# Patient Record
Sex: Male | Born: 1970 | Race: Black or African American | Hispanic: No | Marital: Single | Smoking: Never smoker
Health system: Southern US, Community
[De-identification: ages and names within clinical notes are randomized; demographics above are authoritative.]

## PROBLEM LIST (undated history)

## (undated) DIAGNOSIS — R42 Dizziness and giddiness: Secondary | ICD-10-CM

## (undated) DIAGNOSIS — I340 Nonrheumatic mitral (valve) insufficiency: Secondary | ICD-10-CM

## (undated) DIAGNOSIS — I10 Essential (primary) hypertension: Secondary | ICD-10-CM

## (undated) DIAGNOSIS — I421 Obstructive hypertrophic cardiomyopathy: Secondary | ICD-10-CM

## (undated) DIAGNOSIS — I2510 Atherosclerotic heart disease of native coronary artery without angina pectoris: Secondary | ICD-10-CM

## (undated) DIAGNOSIS — E781 Pure hyperglyceridemia: Secondary | ICD-10-CM

## (undated) DIAGNOSIS — E669 Obesity, unspecified: Secondary | ICD-10-CM

## (undated) HISTORY — DX: Essential (primary) hypertension: I10

## (undated) HISTORY — PX: NO PAST SURGERIES: SHX2092

---
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
## 2010-02-19 ENCOUNTER — Emergency Department (HOSPITAL_COMMUNITY): Admission: EM | Admit: 2010-02-19 | Discharge: 2010-02-19 | Payer: Self-pay | Admitting: Family Medicine

## 2013-10-15 ENCOUNTER — Encounter (HOSPITAL_COMMUNITY): Payer: Self-pay | Admitting: Emergency Medicine

## 2013-10-15 ENCOUNTER — Emergency Department (HOSPITAL_COMMUNITY): Payer: Medicaid Other

## 2013-10-15 ENCOUNTER — Observation Stay (HOSPITAL_COMMUNITY)
Admission: EM | Admit: 2013-10-15 | Discharge: 2013-10-17 | Disposition: A | Discharge status: Home / Self Care | Payer: Medicaid Other | Attending: Internal Medicine | Admitting: Internal Medicine

## 2013-10-15 DIAGNOSIS — R002 Palpitations: Secondary | ICD-10-CM | POA: Diagnosis not present

## 2013-10-15 DIAGNOSIS — Z8249 Family history of ischemic heart disease and other diseases of the circulatory system: Secondary | ICD-10-CM | POA: Diagnosis not present

## 2013-10-15 DIAGNOSIS — R5381 Other malaise: Secondary | ICD-10-CM | POA: Diagnosis not present

## 2013-10-15 DIAGNOSIS — R42 Dizziness and giddiness: Secondary | ICD-10-CM | POA: Diagnosis not present

## 2013-10-15 DIAGNOSIS — E781 Pure hyperglyceridemia: Secondary | ICD-10-CM | POA: Diagnosis not present

## 2013-10-15 DIAGNOSIS — I452 Bifascicular block: Secondary | ICD-10-CM

## 2013-10-15 DIAGNOSIS — E669 Obesity, unspecified: Secondary | ICD-10-CM | POA: Diagnosis not present

## 2013-10-15 DIAGNOSIS — H538 Other visual disturbances: Secondary | ICD-10-CM | POA: Diagnosis not present

## 2013-10-15 DIAGNOSIS — Z9181 History of falling: Secondary | ICD-10-CM | POA: Diagnosis not present

## 2013-10-15 DIAGNOSIS — F17200 Nicotine dependence, unspecified, uncomplicated: Secondary | ICD-10-CM | POA: Diagnosis not present

## 2013-10-15 DIAGNOSIS — R0602 Shortness of breath: Secondary | ICD-10-CM | POA: Diagnosis not present

## 2013-10-15 DIAGNOSIS — I10 Essential (primary) hypertension: Secondary | ICD-10-CM | POA: Diagnosis not present

## 2013-10-15 DIAGNOSIS — R531 Weakness: Secondary | ICD-10-CM | POA: Diagnosis present

## 2013-10-15 DIAGNOSIS — I4510 Unspecified right bundle-branch block: Secondary | ICD-10-CM | POA: Diagnosis not present

## 2013-10-15 DIAGNOSIS — R9431 Abnormal electrocardiogram [ECG] [EKG]: Secondary | ICD-10-CM | POA: Diagnosis not present

## 2013-10-15 DIAGNOSIS — R5383 Other fatigue: Secondary | ICD-10-CM | POA: Diagnosis not present

## 2013-10-15 DIAGNOSIS — R0600 Dyspnea, unspecified: Secondary | ICD-10-CM

## 2013-10-15 HISTORY — DX: Dizziness and giddiness: R42

## 2013-10-15 HISTORY — DX: Obesity, unspecified: E66.9

## 2013-10-15 HISTORY — DX: Pure hyperglyceridemia: E78.1

## 2013-10-15 LAB — CBC
HCT: 43.7 % (ref 39.0–52.0)
HCT: 39.8 % (ref 39.0–52.0)
HEMOGLOBIN: 13.5 g/dL (ref 13.0–17.0)
Hemoglobin: 15.1 g/dL (ref 13.0–17.0)
MCH: 31.9 pg (ref 26.0–34.0)
MCH: 31.3 pg (ref 26.0–34.0)
MCHC: 34.6 g/dL (ref 30.0–36.0)
MCHC: 33.9 g/dL (ref 30.0–36.0)
MCV: 92.2 fL (ref 78.0–100.0)
MCV: 92.3 fL (ref 78.0–100.0)
PLATELETS: 223 10*3/uL (ref 150–400)
Platelets: 187 10*3/uL (ref 150–400)
RBC: 4.74 MIL/uL (ref 4.22–5.81)
RBC: 4.31 MIL/uL (ref 4.22–5.81)
RDW: 14.5 % (ref 11.5–15.5)
RDW: 14.6 % (ref 11.5–15.5)
WBC: 6.7 10*3/uL (ref 4.0–10.5)
WBC: 6.7 10*3/uL (ref 4.0–10.5)

## 2013-10-15 LAB — BASIC METABOLIC PANEL
BUN: 18 mg/dL (ref 6–23)
CHLORIDE: 101 meq/L (ref 96–112)
CO2: 21 meq/L (ref 19–32)
CREATININE: 1.11 mg/dL (ref 0.50–1.35)
Calcium: 8.8 mg/dL (ref 8.4–10.5)
GFR, EST NON AFRICAN AMERICAN: 79 mL/min — AB (ref >90)
Glucose, Bld: 98 mg/dL (ref 70–99)
POTASSIUM: 4.4 meq/L (ref 3.7–5.3)
Sodium: 139 mEq/L (ref 137–147)

## 2013-10-15 LAB — CREATININE, SERUM
Creatinine, Ser: 0.99 mg/dL (ref 0.50–1.35)
GFR calc Af Amer: >90 mL/min (ref >90)
GFR calc non Af Amer: >90 mL/min (ref >90)

## 2013-10-15 LAB — URINALYSIS, ROUTINE W REFLEX MICROSCOPIC
BILIRUBIN URINE: NEGATIVE
Glucose, UA: NEGATIVE mg/dL
HGB URINE DIPSTICK: NEGATIVE
Ketones, ur: NEGATIVE mg/dL
Leukocytes, UA: NEGATIVE
Nitrite: NEGATIVE
PH: 5 (ref 5.0–8.0)
PROTEIN: NEGATIVE mg/dL
SPECIFIC GRAVITY, URINE: 1.022 (ref 1.005–1.030)
Urobilinogen, UA: 1 mg/dL (ref 0.0–1.0)

## 2013-10-15 LAB — CBG MONITORING, ED: Glucose-Capillary: 104 mg/dL — ABNORMAL HIGH (ref 70–99)

## 2013-10-15 LAB — TROPONIN I
Troponin I: <0.3 ng/mL (ref <0.30)
Troponin I: <0.3 ng/mL (ref <0.30)

## 2013-10-15 MED ORDER — ASPIRIN 81 MG PO CHEW
324.0000 mg | CHEWABLE_TABLET | Freq: Once | ORAL | Status: AC
  Administered 2013-10-15: 324 mg via ORAL
  Filled 2013-10-15: qty 4

## 2013-10-15 MED ORDER — SODIUM CHLORIDE 0.9 % IV BOLUS (SEPSIS)
1000.0000 mL | Freq: Once | INTRAVENOUS | Status: AC
  Administered 2013-10-15: 1000 mL via INTRAVENOUS

## 2013-10-15 MED ORDER — ASPIRIN EC 325 MG PO TBEC
325.0000 mg | DELAYED_RELEASE_TABLET | Freq: Every day | ORAL | Status: DC
  Administered 2013-10-16 – 2013-10-17 (×2): 325 mg via ORAL
  Filled 2013-10-15 (×4): qty 1

## 2013-10-15 MED ORDER — MORPHINE SULFATE 2 MG/ML IJ SOLN: 2.0000 mg | INTRAMUSCULAR | Status: DC | PRN

## 2013-10-15 MED ORDER — GI COCKTAIL ~~LOC~~
30.0000 mL | Freq: Four times a day (QID) | ORAL | Status: DC | PRN
  Filled 2013-10-15: qty 30

## 2013-10-15 MED ORDER — ENOXAPARIN SODIUM 40 MG/0.4ML ~~LOC~~ SOLN
40.0000 mg | SUBCUTANEOUS | Status: DC
  Administered 2013-10-15 – 2013-10-16 (×2): 40 mg via SUBCUTANEOUS
  Filled 2013-10-15 (×4): qty 0.4

## 2013-10-15 NOTE — Progress Notes (Signed)
Patient trasfered from ED to 5W11 via strecher; alert and oriented x 4; no complaints of pain; IV saline locked in LAC ; skin intact. Orient patient to room and unit; watch safety video; gave patient care guide; instructed how to use the call bell and  fall risk precautions. Will continue to monitor the patient.

## 2013-10-15 NOTE — H&P (Signed)
[facility] History and Physical  Victor [name] ZOX:[id number] DOB: 06-Aug-1970 DOA: 10/15/2013  Referring physician:  PCP: No PCP Per Patient   Chief Complaint: Weakness  HPI: Victor [name] is a 43 y.o. male without significant past medical history, does not have a primary care provider, presenting to the emergency room with complaints of weakness. He states having generalized weakness over the past week with associated dizziness characterized as "swimmy headed" occurring intermittently. 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 He describes having some exertional shortness of breath, particularly with his work duties. He endorses a family history of coronary artery disease in his mother who underwent percutaneous intervention in her 58s. Patient otherwise denies chest pain, nausea, vomiting, diaphoresis, abdominal pain, syncope or palpitations. He did report having an episode of chest pain last month which was localized in the retrosternal region, non-radiating, lasting several minutes then resolve spontaneously. He denies tobacco abuse however endorses to a tobacco. In the emergency room EKG showing bifascicular block, T wave inversions, with initial troponin within normal limits.                           Review of Systems:  Constitutional:  No weight loss, night sweats, Fevers, chills, fatigue.  HEENT:  No headaches, Difficulty swallowing,Tooth/dental problems,Sore throat,  No sneezing, itching, ear ache, nasal congestion, post nasal drip,  Cardio-vascular:  No chest pain, Orthopnea, PND, swelling in lower extremities, anasarca, dizziness, palpitations  GI:  No heartburn, indigestion, abdominal pain, nausea, vomiting, diarrhea, change in bowel habits, loss of appetite  Resp:  Positive shortness of breath with exertion. No excess mucus, no productive cough, No non-productive cough, No coughing up of blood.No change in color  of mucus.No wheezing.No chest wall deformity  Skin:  no rash or lesions.  GU:  no dysuria, change in color of urine, no urgency or frequency. No flank pain.  Musculoskeletal:  No joint pain or swelling. No decreased range of motion. No back pain.  Psych:  No change in mood or affect. No depression or anxiety. No memory loss.   History reviewed. No pertinent past medical history. History reviewed. No pertinent past surgical history. Social History:  reports that he has never smoked. His smokeless tobacco use includes Chew. He reports that he drinks alcohol. He reports that he does not use illicit drugs.  No Known Allergies  No family history on file.   Prior to Admission medications   Medication Sig Start Date End Date Taking? Authorizing Provider  ibuprofen (ADVIL,MOTRIN) 200 MG tablet Take 600 mg by mouth every 6 (six) hours as needed for moderate pain.   Yes Historical Provider, MD   Physical Exam: Filed Vitals:   10/15/13 1515  BP: 154/77  Pulse: 81  Temp:   Resp: 15    BP 154/77  Pulse 81  Temp(Src) 98.6 F (37 C) (Oral)  Resp 15  Ht 5\' 7"  (1.702 m)  Wt 113.535 kg (250 lb 4.8 oz)  BMI 39.19 kg/m2  SpO2 99%  General:  Appears calm and comfortable Eyes: PERRL, normal lids, irises & conjunctiva ENT: grossly normal hearing, lips & tongue Neck: no LAD, masses or thyromegaly Cardiovascular: RRR, no m/r/g. No LE edema. Telemetry: SR, no arrhythmias  Respiratory: CTA bilaterally, no w/r/r. Normal respiratory effort. Abdomen: soft, ntnd Skin: no rash or induration seen on limited exam Musculoskeletal: grossly normal tone BUE/BLE Psychiatric: grossly normal mood and affect,  speech fluent and appropriate Neurologic: grossly non-focal.          Labs on Admission:  Basic Metabolic Panel:  Recent Labs Lab 10/15/13 1215  NA 139  K 4.4  CL 101  CO2 21  GLUCOSE 98  BUN 18  CREATININE 1.11  CALCIUM 8.8   Liver Function Tests: No results found for this  basename: AST, ALT, ALKPHOS, BILITOT, PROT, ALBUMIN,  in the last 168 hours No results found for this basename: LIPASE, AMYLASE,  in the last 168 hours No results found for this basename: AMMONIA,  in the last 168 hours CBC:  Recent Labs Lab 10/15/13 1215  WBC 6.7  HGB 15.1  HCT 43.7  MCV 92.2  PLT 223   Cardiac Enzymes:  Recent Labs Lab 10/15/13 1229  TROPONINI <0.30    BNP (last 3 results) No results found for this basename: PROBNP,  in the last 8760 hours CBG:  Recent Labs Lab 10/15/13 1150  GLUCAP 104*    Radiological Exams on Admission: Dg Chest 2 View  10/15/2013   CLINICAL DATA:  Eval cardiac silohuette  EXAM: CHEST  2 VIEW  COMPARISON:  None.  FINDINGS: Low lung volumes. The heart size and mediastinal contours are within normal limits. Both lungs are clear. The visualized skeletal structures are unremarkable.  IMPRESSION: No active cardiopulmonary disease.   Electronically Signed   By: Salome HolmesHector  Cooper M.D.   On: 10/15/2013 13:26    EKG: Independently reviewed. Bifascicular block, T-Wave inversions.   Assessment/Plan Active Problems:   Dizziness   Weakness   Abnormal EKG   1. Weakness. Patient without significant past medical history, presented with a one month history of weakness and poor tolerance to physical exertion. Patient works in a family history of coronary artery disease. Initial troponin negative. Will place patient in overnight observation, cycle troponin x3 sets. Obtain fasting lipid panel for risk stratification. Aspirin therapy to 325 mg by mouth daily, check a transthoracic echocardiogram. Will also check a TSH.  2.  DVT prophylaxis: Lovenox    Code Status: Full Code Family Communication:  Disposition Plan: Will place in overnight observation, do not anticipate patient requiring greater than 2 nights hospitalization  Time spent: 55 min  Jeralyn BennettZAMORA, Caoilainn Sacks [facility] Pager [phone]

## 2013-10-15 NOTE — ED Notes (Signed)
Patient transported to X-ray 

## 2013-10-15 NOTE — ED Provider Notes (Signed)
CSN: [id number]     Arrival date & time 10/15/13  1123 History   First MD Initiated Contact with Patient 10/15/13 1208     Chief Complaint  Patient presents with  . Dizziness  . Weakness     (Consider location/radiation/quality/duration/timing/severity/associated sxs/prior Treatment) Patient is a 43 y.o. male presenting with dizziness. The history is provided by the patient.  Dizziness Quality:  Lightheadedness Severity:  Mild Onset quality:  Gradual Duration:  2 weeks Timing:  Intermittent Progression:  Waxing and waning Chronicity:  New Context: standing up   Context: not when bending over, not with eye movement, not with head movement and not with loss of consciousness   Relieved by: sometimes goes away, no particular triggers or alleviating factors. Worsened by:  Nothing tried Ineffective treatments:  None tried Associated symptoms: no blood in stool, no chest pain, no diarrhea, no headaches, no nausea, no shortness of breath, no syncope, no vomiting and no weakness   Risk factors: no anemia and no new medications     History reviewed. No pertinent past medical history. History reviewed. No pertinent past surgical history. No family history on file. History  Substance Use Topics  . Smoking status: Never Smoker   . Smokeless tobacco: Current User    Types: Chew  . Alcohol Use: Yes    Review of Systems  Constitutional: Negative for fever, activity change and appetite change.  HENT: Negative for congestion and rhinorrhea.   Eyes: Negative for discharge and itching.  Respiratory: Negative for cough, shortness of breath and wheezing.   Cardiovascular: Negative for chest pain and syncope.  Gastrointestinal: Negative for nausea, vomiting, abdominal pain, diarrhea, constipation and blood in stool.  Genitourinary: Negative for hematuria, decreased urine volume and difficulty urinating.  Skin: Negative for rash and wound.  Neurological: Positive for dizziness. Negative for  syncope, weakness, numbness and headaches.  All other systems reviewed and are negative.      Allergies  Review of patient's allergies indicates no known allergies.  Home Medications   Current Outpatient Rx  Name  Route  Sig  Dispense  Refill  . ibuprofen (ADVIL,MOTRIN) 200 MG tablet   Oral   Take 600 mg by mouth every 6 (six) hours as needed for moderate pain.          BP 158/73  Pulse 87  Temp(Src) 98.6 F (37 C) (Oral)  Resp 20  Ht 5\' 7"  (1.702 m)  Wt 250 lb 4.8 oz (113.535 kg)  BMI 39.19 kg/m2  SpO2 97% Physical Exam  Vitals reviewed. Constitutional: He is oriented to person, place, and time. He appears well-developed and well-nourished. No distress.  HENT:  Head: Normocephalic and atraumatic.  Mouth/Throat: Oropharynx is clear and moist. No oropharyngeal exudate.  Eyes: Conjunctivae and EOM are normal. Pupils are equal, round, and reactive to light. Right eye exhibits no discharge. Left eye exhibits no discharge. No scleral icterus.  Neck: Normal range of motion. Neck supple.  Cardiovascular: Normal rate, regular rhythm, normal heart sounds and intact distal pulses.  Exam reveals no gallop and no friction rub.   No murmur heard. Pulmonary/Chest: Effort normal and breath sounds normal. No respiratory distress. He has no wheezes. He has no rales.  Abdominal: Soft. He exhibits no distension and no mass. There is no tenderness.  Musculoskeletal: Normal range of motion.  Neurological: He is alert and oriented to person, place, and time. No cranial nerve deficit. He exhibits normal muscle tone. Coordination normal.  5/5 strength in all exts,  normal sensation in all extremities, 2+ DTRs b/l in patella and brachioradilias, F2N neg, H2S neg, Romberg neg, ambulatory without ataxia, CN II-XII without deficit  Skin: Skin is warm. No rash noted. He is not diaphoretic.    ED Course  Procedures (including critical care time) Labs Review Labs Reviewed  BASIC METABOLIC PANEL -  Abnormal; Notable for the following:    GFR calc non Af Amer 79 (*)    All other components within normal limits  CBG MONITORING, ED - Abnormal; Notable for the following:    Glucose-Capillary 104 (*)    All other components within normal limits  CBC  TROPONIN I  URINALYSIS, ROUTINE W REFLEX MICROSCOPIC  CBG MONITORING, ED   Imaging Review Dg Chest 2 View  10/15/2013   CLINICAL DATA:  Eval cardiac silohuette  EXAM: CHEST  2 VIEW  COMPARISON:  None.  FINDINGS: Low lung volumes. The heart size and mediastinal contours are within normal limits. Both lungs are clear. The visualized skeletal structures are unremarkable.  IMPRESSION: No active cardiopulmonary disease.   Electronically Signed   By: Salome Holmes M.D.   On: 10/15/2013 13:26     EKG Interpretation   Date/Time:  Thursday October 15 2013 11:29:30 EDT Ventricular Rate:  96 PR Interval:  152 QRS Duration: 138 QT Interval:  416 QTC Calculation: 525 R Axis:   -77 Text Interpretation:  Normal sinus rhythm Biatrial enlargement Right  bundle branch block Left anterior fascicular block Bifasicular block T  wave abnormality, consider inferolateral ischemia Abnormal ECG Confirmed  by WARD,  DO, KRISTEN ([id number]) on 10/15/2013 12:27:41 PM      MDM   MDM: 43 y.o. AAM w/ no PMHx w/ cc: of dizziness for 2 weeks. Non vertiginous. Intermittent. Currently having some dizziness. Complains of blurred vision but has baseline L eye vision deficit and states the blurred vision has resolved. Denies chest pain, SOB. AFVSS, well appaering, no neuro deficits. EKG with TWIS throughout. Trop negative. Other labs negative. Very concerning EKG, will admit for cardiac r/o. Care of case d/w my attending.   Final diagnoses:  None   Admit  Pilar Jarvis, MD 10/15/13 1515

## 2013-10-15 NOTE — ED Notes (Signed)
PT reports onset of dizziness/unsteadiness two weeks ago, but came in today b/c he now feels weak and has some blurred vision. Denies nausea, sob, chest pain.

## 2013-10-15 NOTE — Discharge Planning (Signed)
[facility] Felicia E, [facility] Liaison  Spoke to patient about primary care resources and establishing care with a provider. [facility], medicalcard application and resource guide was given. My contact information was also provided for any future questions or concerns.

## 2013-10-15 NOTE — ED Notes (Signed)
Patient states that he is dizzy but denies blurry vision at this time.  patietn states that he feels like when he is walking he could fall because of the dizziness

## 2013-10-15 NOTE — ED Notes (Signed)
Patient does not need a stroke swallow screen and is ok to eat and drink, per Dr. Elesa MassedWard

## 2013-10-15 NOTE — ED Provider Notes (Signed)
I saw and evaluated the patient, reviewed the resident's note and I agree with the findings and plan.   EKG Interpretation   Date/Time:  Thursday October 15 2013 11:29:30 EDT Ventricular Rate:  96 PR Interval:  152 QRS Duration: 138 QT Interval:  416 QTC Calculation: 525 R Axis:   -77 Text Interpretation:  Normal sinus rhythm Biatrial enlargement Right  bundle branch block Left anterior fascicular block Bifasicular block T  wave abnormality, consider inferolateral ischemia Abnormal ECG Confirmed  by Devri Kreher,  DO, Taela Charbonneau ([id number]) on 10/15/2013 12:27:41 PM      Pt is a 43 y.o. F with no known past medical history presents emergency room with 2 weeks of intermittent dizziness where he feels that he is going to pass out. He does not vertiginous. Denies any chest pain, palpitations, vomiting, diarrhea, bloody stool or melena, shortness of breath, cough, fever. His EKG is grossly abnormal and he denies a cardiac history but states he has not been seen by her primary doctor in several years. He does have family history of cardiac disease and he chews tobacco. His troponin here is negative. He is mildly orthostatic with slight elevation in heart rate with standing. We'll give IV fluids. Chest x-ray is clear. He is neurologically intact. Discussed with hospitalist for admission for ACS rule out and I am concerned that his dizziness and near syncopal events may be his anginal equivalent.  Layla MawKristen N Tearsa Kowalewski, DO 10/15/13 1623

## 2013-10-16 ENCOUNTER — Encounter (HOSPITAL_COMMUNITY): Payer: Self-pay | Admitting: Nurse Practitioner

## 2013-10-16 ENCOUNTER — Observation Stay (HOSPITAL_COMMUNITY): Payer: Medicaid Other

## 2013-10-16 DIAGNOSIS — I10 Essential (primary) hypertension: Secondary | ICD-10-CM

## 2013-10-16 DIAGNOSIS — R9431 Abnormal electrocardiogram [ECG] [EKG]: Secondary | ICD-10-CM

## 2013-10-16 DIAGNOSIS — R42 Dizziness and giddiness: Secondary | ICD-10-CM | POA: Diagnosis not present

## 2013-10-16 DIAGNOSIS — I517 Cardiomegaly: Secondary | ICD-10-CM

## 2013-10-16 DIAGNOSIS — R0989 Other specified symptoms and signs involving the circulatory and respiratory systems: Secondary | ICD-10-CM

## 2013-10-16 DIAGNOSIS — R5383 Other fatigue: Secondary | ICD-10-CM | POA: Diagnosis not present

## 2013-10-16 DIAGNOSIS — E781 Pure hyperglyceridemia: Secondary | ICD-10-CM | POA: Diagnosis not present

## 2013-10-16 DIAGNOSIS — R5381 Other malaise: Secondary | ICD-10-CM | POA: Diagnosis not present

## 2013-10-16 DIAGNOSIS — R0609 Other forms of dyspnea: Secondary | ICD-10-CM

## 2013-10-16 DIAGNOSIS — R0600 Dyspnea, unspecified: Secondary | ICD-10-CM

## 2013-10-16 LAB — LIPID PANEL
Cholesterol: 138 mg/dL (ref 0–200)
HDL: 21 mg/dL — AB (ref >39)
LDL CALC: UNDETERMINED mg/dL (ref 0–99)
TRIGLYCERIDES: 928 mg/dL — AB (ref <150)
Total CHOL/HDL Ratio: 6.6 RATIO
VLDL: UNDETERMINED mg/dL (ref 0–40)

## 2013-10-16 LAB — COMPREHENSIVE METABOLIC PANEL
ALBUMIN: 3.4 g/dL — AB (ref 3.5–5.2)
ALT: 74 U/L — ABNORMAL HIGH (ref 0–53)
AST: 54 U/L — AB (ref 0–37)
Alkaline Phosphatase: 55 U/L (ref 39–117)
BILIRUBIN TOTAL: 0.9 mg/dL (ref 0.3–1.2)
BUN: 11 mg/dL (ref 6–23)
CO2: 22 mEq/L (ref 19–32)
CREATININE: 0.95 mg/dL (ref 0.50–1.35)
Calcium: 8.8 mg/dL (ref 8.4–10.5)
Chloride: 101 mEq/L (ref 96–112)
GFR calc Af Amer: >90 mL/min (ref >90)
Glucose, Bld: 103 mg/dL — ABNORMAL HIGH (ref 70–99)
Potassium: 3.9 mEq/L (ref 3.7–5.3)
Sodium: 137 mEq/L (ref 137–147)
Total Protein: 6.9 g/dL (ref 6.0–8.3)

## 2013-10-16 LAB — TSH: TSH: 2.164 u[IU]/mL (ref 0.350–4.500)

## 2013-10-16 LAB — PRO B NATRIURETIC PEPTIDE: Pro B Natriuretic peptide (BNP): 72.3 pg/mL (ref 0–125)

## 2013-10-16 LAB — HEMOGLOBIN A1C
HEMOGLOBIN A1C: 6.1 % — AB (ref <5.7)
Mean Plasma Glucose: 128 mg/dL — ABNORMAL HIGH (ref <117)

## 2013-10-16 LAB — TROPONIN I: Troponin I: <0.3 ng/mL (ref <0.30)

## 2013-10-16 MED ORDER — SODIUM CHLORIDE 0.9 % IV SOLN
INTRAVENOUS | Status: DC
  Administered 2013-10-16 – 2013-10-17 (×3): via INTRAVENOUS

## 2013-10-16 MED ORDER — GEMFIBROZIL 600 MG PO TABS
600.0000 mg | ORAL_TABLET | Freq: Two times a day (BID) | ORAL | Status: DC
  Administered 2013-10-16 – 2013-10-17 (×2): 600 mg via ORAL
  Filled 2013-10-16 (×4): qty 1

## 2013-10-16 MED ORDER — AMLODIPINE BESYLATE 2.5 MG PO TABS
2.5000 mg | ORAL_TABLET | Freq: Every day | ORAL | Status: DC
  Administered 2013-10-16 – 2013-10-17 (×2): 2.5 mg via ORAL
  Filled 2013-10-16 (×2): qty 1

## 2013-10-16 NOTE — Evaluation (Signed)
Physical Therapy Vestibular Evaluation Patient Details Name: Victor [name] MRN: [id number] DOB: 11/08/1970 Today's Date: 10/16/2013   History of Present Illness  Victor [name] is a 43 y.o. male without significant past medical history, does not have a primary care provider, presenting to the emergency room with complaints of weakness. He states having generalized weakness over the past week with associated dizziness characterized as "swimmy headed" occurring intermittently. 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 He describes having some exertional shortness of breath, particularly with his work duties. He endorses a family history of coronary artery disease in his mother who underwent percutaneous intervention in her 66s. Patient otherwise denies chest pain, nausea, vomiting, diaphoresis, abdominal pain, syncope or palpitations. He did report having an episode of chest pain last month which was localized in the retrosternal region, non-radiating, lasting several minutes then resolve spontaneously. He denies tobacco abuse however endorses to a tobacco. In the emergency room EKG showing bifascicular block, T wave inversions, with initial troponin within normal limits  Clinical Impression  Patient presents with independent mobility; but with symptoms of potential bilateral vestibular hypofunction.  He may benefit from referral to outpatient PT for vestibular/balance therapy.  Gave pt referral to take when he has follow up appointment.  No further acute level PT needs at this time.    Follow Up Recommendations Outpatient PT    Equipment Recommendations  None recommended by PT    Recommendations for Other Services       Precautions / Restrictions Precautions Precautions: None      Mobility  Bed Mobility Overal bed mobility: Independent                Transfers Overall transfer level: Independent                   Ambulation/Gait Ambulation/Gait assistance: Independent Ambulation Distance (Feet): 250 Feet Assistive device: None Gait Pattern/deviations: WFL(Within Functional Limits)   Gait velocity interpretation: >2.62 ft/sec, indicative of independent community ambulator    Stairs Stairs: Yes Stairs assistance: Modified independent (Device/Increase time) Stair Management: One rail Right;Alternating pattern Number of Stairs: 3    Wheelchair Mobility    Modified Rankin (Stroke Patients Only)       Balance Overall balance assessment: Independent           Standing balance-Leahy Scale: Normal     Single Leg Stance - Left Leg: 10 Tandem Stance - Right Leg: 30     Rhomberg - Eyes Closed: 30   High Level Balance Comments: wavers while eyes closed feet together, but no loss of balance     Pertinent Vitals/Pain No pain    Home Living Family/patient expects to be discharged to:: Private residence Living Arrangements: Parent Available Help at Discharge: Family Type of Home: House Home Access: Stairs to enter Entrance Stairs-Rails: Chemical engineer of Steps: 3 Home Layout: One level Home Equipment: None      Prior Function Level of Independence: Independent               Hand Dominance        Extremity/Trunk Assessment               Lower Extremity Assessment: Overall WFL for tasks assessed         Communication   Communication: No difficulties  Cognition Arousal/Alertness: Awake/alert Behavior During Therapy: WFL for tasks assessed/performed Overall Cognitive Status: Within Functional Limits for tasks assessed  General Comments General comments (skin integrity, edema, etc.): Vestibular testing: oculomotor: noted right eye not aligned, 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     Exercises  Briefly educated pt in VOR training exercises, but encouraged outpatient follow up      Assessment/Plan    PT Assessment All further PT needs can be met in the next venue of care  PT Diagnosis Abnormality of gait   PT Problem List Decreased balance;Other (comment) (dizziness)  PT Treatment Interventions     PT Goals (Current goals can be found in the Care Plan section) Acute Rehab PT Goals PT Goal Formulation: No goals set, d/c therapy    Frequency     Barriers to discharge        End of Session Equipment Utilized During Treatment: Gait belt Activity Tolerance: Patient tolerated treatment well      Functional Limitation: Mobility: Walking and moving around Mobility: Walking and Moving Around Current Status ([id number]): At least 1 percent but less than 20 percent impaired, limited or restricted Mobility: Walking and Moving Around Goal Status [phone]): At least 1 percent but less than 20 percent impaired, limited or restricted Mobility: Walking and Moving Around Discharge Status [phone]): At least 1 percent but less than 20 percent impaired, limited or restricted    Time: 1611-1650 PT Time Calculation (min): 39 min   Charges:   PT Evaluation $Initial PT Evaluation Tier I: 1 Procedure PT Treatments $Gait Training: 8-22 mins $Neuromuscular Re-education: 8-22 mins   PT G Codes:     Functional Limitation: Mobility: Walking and moving around    [facility] 10/16/2013, 5:16 PM [place], [place] 10/16/2013

## 2013-10-16 NOTE — Progress Notes (Signed)
  Echocardiogram 2D Echocardiogram has been performed.  Georgian CoWILLIAMS, Muzammil Bruins 10/16/2013, 3:38 PM

## 2013-10-16 NOTE — Care Management Note (Signed)
    Page 1 of 1   10/19/2013     6:17:32 PM   CARE MANAGEMENT NOTE 10/19/2013  Patient:  Victor [name],Victor [name]   Account Number:  [id number]  Date Initiated:  10/16/2013  Documentation initiated by:  Letha CapeAYLOR,Meiya Wisler  Subjective/Objective Assessment:   dx weaknjess, dizzy     Action/Plan:   pt eval-   Anticipated DC Date:  10/16/2013   Anticipated DC Plan:  HOME/SELF CARE      DC Planning Services  CM consult  MATCH Program  Medication Assistance      Choice offered to / List presented to:             Status of service:  Completed, signed off Medicare Important Message given?   (If response is "NO", the following Medicare IM given date fields will be blank) Date Medicare IM given:   Date Additional Medicare IM given:    Discharge Disposition:  HOME/SELF CARE  Per UR Regulation:  Reviewed for med. necessity/level of care/duration of stay  If discussed at Long Length of Stay Meetings, dates discussed:    Comments:  10/16/13 1629 Letha Capeeborah Lollie Gunner RN, BSN [phone] patient lives with mother, patient has f/u apt scheduled with wellness clinic on 4/20.  NCM assisted patient with meds with Match Letter.  physical therapy is working with patient, pateint does not have any insurance at this time.

## 2013-10-16 NOTE — Progress Notes (Signed)
Utilization review completed.  

## 2013-10-16 NOTE — Hospital Discharge Follow-Up (Signed)
[facility] Tivis RingerFelicia E, Community Liaison  [facility] at 9:00 am. Patient will be establishing care and obtaining the orange card at the practice. Patient is aware of this appointment, patients RN notified about this appointment as well. My contact information was provided for any future questions or concerns.

## 2013-10-16 NOTE — Consult Note (Signed)
CARDIOLOGY CONSULT NOTE   Patient ID: Victor [name] MRN: [id number], DOB/AGE: 45/03/1971   Admit date: 10/15/2013 Date of Consult: 10/16/2013  Primary Physician: No PCP Per Patient Primary Cardiologist: new to    Pt. Profile 43 y.o. AA male with 2 wk hx of DOE, dizziness, and generalized weakness who [facility]. EKG revealed bifascicular block,  RBBB with T wave inversion. Troponin x3 negative.   Problem List  Past Medical History  Diagnosis Date  . Dizzy 10/15/2013     "SWIMMY HEAD    "  . Hypertriglyceridemia   . Obesity     History reviewed. No pertinent past surgical history.   Allergies  No Known Allergies  HPI  43 y.o. A.A. male who was last seen by a medical provider 15 yrs ago, [facility] weakness. Patient reports that 1 month ago he experienced a 1 minute episode of retrosternal chest pain while standing that resolved spontaneously. Positive for palpitations/fluttering, and SOB. No syncope, N/V, diaphoresis or other associated symptoms. He has not experienced a episode of chest pain since the prior event. He does admit to "numbness" in the anterior lateral portion of his left upper thigh. He reports the loses of sensation occurred around the time of the retrosternal chest pain and remains.  He also admits to mandible pain since the event that occurs intermittently and is relieved with ibuprofen. He is on no home medications and only takes ibuprofen for pain.   For the last 2 weeks he has experienced intermittent generalized weakness with associated dizziness, and blurry vision while driving and while standing.  He currently does yard work for a living and has had decreased tolerance to physical exertion with SOB over the past 8 mos. He denies smoking; however uses smokeless tobacco every other day. He drinks approx 96 fl.oz of beer/wk. He denies illicit drug use stating the last time he smoked marijuana was in high school. He was incarcertaed 1  month ago were he underwent HIV testing. Result negative. He has a positive family history of CAD in his mother who underwent PCI last year @ age 858. Patient reports that she also underwent repair of an aneurysm in her chest last week.     Yesterday, he was driving to [facility] to visit his mother when he had another episode of lightheadedness.  He describes this as feeling swimmy headed w/o room spinning.  Lightheadedness is unchanged by position changes of his head but has been better with sitting or lying down.  He decided to drive himself to the ED where EKG revealed a bifascicular block, RBBB with T wave inversions. Troponin negative x3, TSH 2.164, Trig 928, BUN/Creat 18/0.99. He was admitted to [facility] for observation. He has experienced no events overnight. Orthostatic BP done this morning revealed 158/84 sitting, and 144/64 standing, HR 109, SpO2 94% on Rm air. Cardiology is consulting this morning due to patient's EKG changes, sig family hx, and lack of past health care access.   Inpatient Medications  . aspirin EC  325 mg Oral Daily  . enoxaparin (LOVENOX) injection  40 mg Subcutaneous Q24H  . gemfibrozil  600 mg Oral BID AC   Family History Family History  Problem Relation Age of Onset  . CAD Mother     stenting @ 958; alive @ 8959  . Aneurysm Mother     thoracic s/p surgery @ 3659  . Other Father     unknown    Social History History  Social History  . Marital Status: Single    Spouse Name: N/A    Number of Children: N/A  . Years of Education: N/A   Occupational History  . Not on file.   Social History Main Topics  . Smoking status: Never Smoker   . Smokeless tobacco: Current User    Types: Chew  . Alcohol Use: Yes     Comment: 4 12 oz beers twice/wk.  . Drug Use: No  . Sexual Activity: Not on file   Other Topics Concern  . Not on file   Social History Narrative   Lives in [place].  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    Review of Systems  General:  No  chills, fever, night sweats or weight changes.  Cardiovascular:  + chest pain x 1 about 1 month ago, + dyspnea on exertion since last summer, + palpitations about once/wk x a few secs. Negative  edema, orthopnea, paroxysmal nocturnal dyspnea. Dermatological: No rash, lesions/masses Respiratory: Positive dyspnea. No cough.  Urologic: No hematuria, dysuria Abdominal:   No nausea, vomiting, diarrhea, bright red blood per rectum, melena, or hematemesis Neurologic:  + visual changes, and bilateral leg weakness with standing. Loss of sharp/dull sensation on left outer upper thigh.  No changes in mental status. All other systems reviewed and are otherwise negative except as noted above.  Physical Exam  Blood pressure 148/91, pulse 69, temperature 98.2 F (36.8 C), temperature source Oral, resp. rate 18, height 5\' 7"  (1.702 m), weight 250 lb (113.399 kg), SpO2 97.00%.  General: Pleasant, NAD Psych: Normal affect. Answers questions appropriately.   Neuro: Alert and oriented X 3. Moves all extremities spontaneously. HEENT: Alpha. AT. Sclerae non-icteric, conjunctiva pink and moist. Turbinated non-injected. No nasal discharge. No thyromegaly or nodules. Oropharnyx pink and moist. Halitosis. Missing bottom 4 front teeth.     Neck: Supple without bruits or JVD. Carotid upstroke 2+.  Lungs:  Resp regular and unlabored, CTA. Low volume throughout.  Heart: RRR no s3, s4, 2/6 TR Cap refill <3 sec.  Abdomen: Obese. Soft, non-tender, non-distended, BS + x 4.  Extremities: No clubbing, cyanosis or edema. DP/PT/Radials 2+ and equal bilaterally.  Labs   Recent Labs  10/15/13 1229 10/15/13 2037 10/16/13 0449  TROPONINI <0.30 <0.30 <0.30   Lab Results  Component Value Date   WBC 6.7 10/15/2013   HGB 13.5 10/15/2013   HCT 39.8 10/15/2013   MCV 92.3 10/15/2013   PLT 187 10/15/2013     Recent Labs Lab 10/16/13 0854  NA 137  K 3.9  CL 101  CO2 22  BUN 11  CREATININE 0.95  CALCIUM 8.8  PROT 6.9    BILITOT 0.9  ALKPHOS 55  ALT 74*  AST 54*  GLUCOSE 103*   Lab Results  Component Value Date   CHOL 138 10/16/2013   HDL 21* 10/16/2013   LDLCALC UNABLE TO CALCULATE IF TRIGLYCERIDE OVER 400 mg/dL 1/61/0960   TRIG 454* 0/98/1191   Radiology/Studies  Dg Chest 2 View  10/15/2013   CLINICAL DATA:  Eval cardiac silohuette  EXAM: CHEST  2 VIEW  COMPARISON:  None.  FINDINGS: Low lung volumes. The heart size and mediastinal contours are within normal limits. Both lungs are clear. The visualized skeletal structures are unremarkable.  IMPRESSION: No active cardiopulmonary disease.   Electronically Signed   By: Salome Holmes M.D.   On: 10/15/2013 13:26   ECG 10/16/2013: NSR, Left anterior fasciolar block (V3, V4,  II, III, aVf), possible new RBBB. T wave inversion.   ASSESSMENT AND PLAN 43 y.o. A.A. Male with a sig family hx of CAD who presented with 2 weeks of DOE and SOB 3/26. EKG revealed a bifascicular block, RBBB with T wave inversions. Troponin negative x3, TSH 2.164, Trig 928, BUN/Creat 18/0.99. K 4.4. Admitted to 5W for observation. He has experienced no events overnight. Orthostatic BP done this morning revealed 158/84 sitting, and 144/64 standing, HR 109, SpO2 94% on Rm air. Patient's current [place] Soft CV risk is 82% for CAD, CVD, PVD, and HF.   Abnl ECG/DOE > Presents with wkns/lightheadedness and 1 episode of chest pain about a month ago.  ECG shows RBBB with strain pattern/T wave changes.  Prefer to get echo to assess EF today.  If EF down->Cath, otw, will plan on cardiolite in the AM. > Continue tele > Schedule for ECHO > ASA therapy  HTN, Stage I > Initiate low-dose amlodipine for starters.  > Continue to monitor BP  Leg weakness/numbness  > Consider CT of head to r/o stroke. Patient is at high risk for CVD in the presence of ethnicity, poor health management and possible commorbidities.   Hypertriglycerdemia (928) >fibrate initiated.  Signed, Pedro Earls,  [employer] STUDENT- NP 10/16/2013, 11:04 AM   Patient seen and examined independently. Ms. Sanders'/Chris Berge's, NP note reviewed carefully - agree with theri assessment and plan. I have edited the note based on my findings.   He has had progressive DOE over the past few weeks but no overt HF. W/u so far has revealed undiagnosed HTN, abnormal ECG (RBBB/LVH with repol) and maked hypertriglyceridemia.Agree with ECHO. If echo normal will proceed with ETT/Myoview. If echo abnormal consider cath. Start amlodipine for HTN. Fibrate started by primary team. Likely needs outpatient sleep study as well.   Birdie Fetty,MD 11:27 AM

## 2013-10-16 NOTE — Progress Notes (Signed)
PATIENT DETAILS Name: Victor [name] Age: 43 y.o. Sex: male Date of Birth: 11-26-1970 Admit Date: 10/15/2013 Admitting Physician Jeralyn Bennett, MD PCP:No PCP Per Patient  Subjective: Admitted with lightheadedness and weakness. No lightheadedness at rest, however on ambulation continues to have mild lightheadedness.  Assessment/Plan: Principal Problem: Lightheadedness - Not sure what the exact etiology is. Orthostatic vitals and negative this morning. - Telemetry essentially unremarkable, EKG with significant T wave inversions. - We'll check a CT head, consult cardiology- suspect will need a nuclear stress test while inpatient.  Active Problems: Hypertriglyceridemia - Check A1c, however we'll go ahead and start Lopid - Further optimization will need to be done in the outpatient setting.  Hypertension - Await further workup in terms of his stress test, A1c-will likely need to be initiated on Antihypertensives prior to discharge.    Abnormal EKG - Await cardiology eval. - Checking a CT head.   Disposition: Remain inpatient  DVT Prophylaxis: Prophylactic Lovenox   Code Status: Full code   Family Communication  none at bedside.   Procedures:   none  CONSULTS:  cardiology  Time spent 40 minutes-which includes 50% of the time with face-to-face with patient/ family and coordinating care related to the above assessment and plan.    MEDICATIONS: Scheduled Meds: . aspirin EC  325 mg Oral Daily  . enoxaparin (LOVENOX) injection  40 mg Subcutaneous Q24H  . gemfibrozil  600 mg Oral BID AC   Continuous Infusions: . sodium chloride     PRN Meds:.gi cocktail, morphine injection  Antibiotics: Anti-infectives   None       PHYSICAL EXAM: Vital signs in last 24 hours: Filed Vitals:   10/16/13 0825 10/16/13 0828 10/16/13 0830 10/16/13 0950  BP: 165/85 152/84 144/99 148/91  Pulse: 72 72  69  Temp:    98.2 F (36.8 C)  TempSrc:    Oral  Resp:    18    Height:      Weight:      SpO2: 99% 96%  97%    Weight change:  Filed Weights   10/15/13 1129 10/15/13 1635  Weight: 113.535 kg (250 lb 4.8 oz) 113.399 kg (250 lb)   Body mass index is 39.15 kg/(m^2).   Gen Exam: Awake and alert with clear speech.   Neck: Supple, No JVD.   Chest: B/L Clear.   CVS: S1 S2 Regular, no murmurs.  Abdomen: soft, BS +, non tender, non distended.  Extremities: no edema, lower extremities warm to touch. Neurologic: Non Focal.   Skin: No Rash.   Wounds: N/A.   Intake/Output from previous day: No intake or output data in the 24 hours ending 10/16/13 1024   LAB RESULTS: CBC  Recent Labs Lab 10/15/13 1215 10/15/13 2037  WBC 6.7 6.7  HGB 15.1 13.5  HCT 43.7 39.8  PLT 223 187  MCV 92.2 92.3  MCH 31.9 31.3  MCHC 34.6 33.9  RDW 14.5 14.6    Chemistries   Recent Labs Lab 10/15/13 1215 10/15/13 2037 10/16/13 0854  NA 139  --  137  K 4.4  --  3.9  CL 101  --  101  CO2 21  --  22  GLUCOSE 98  --  103*  BUN 18  --  11  CREATININE 1.11 0.99 0.95  CALCIUM 8.8  --  8.8    CBG:  Recent Labs Lab 10/15/13 1150  GLUCAP 104*    GFR Estimated Creatinine Clearance: 119.3  ml/min (by C-G formula based on Cr of 0.95).  Coagulation profile No results found for this basename: INR, PROTIME,  in the last 168 hours  Cardiac Enzymes  Recent Labs Lab 10/15/13 1229 10/15/13 2037 10/16/13 0449  TROPONINI <0.30 <0.30 <0.30    No components found with this basename: POCBNP,  No results found for this basename: DDIMER,  in the last 72 hours No results found for this basename: HGBA1C,  in the last 72 hours  Recent Labs  10/16/13 0449  CHOL 138  HDL 21*  LDLCALC UNABLE TO CALCULATE IF TRIGLYCERIDE OVER 400 mg/dL  TRIG 161928*  CHOLHDL 6.6    Recent Labs  10/15/13 2037  TSH 2.164   No results found for this basename: VITAMINB12, FOLATE, FERRITIN, TIBC, IRON, RETICCTPCT,  in the last 72 hours No results found for this basename:  LIPASE, AMYLASE,  in the last 72 hours  Urine Studies No results found for this basename: UACOL, UAPR, USPG, UPH, UTP, UGL, UKET, UBIL, UHGB, UNIT, UROB, ULEU, UEPI, UWBC, URBC, UBAC, CAST, CRYS, UCOM, BILUA,  in the last 72 hours  MICROBIOLOGY: No results found for this or any previous visit (from the past 240 hour(s)).  RADIOLOGY STUDIES/RESULTS: Dg Chest 2 View  10/15/2013   CLINICAL DATA:  Eval cardiac silohuette  EXAM: CHEST  2 VIEW  COMPARISON:  None.  FINDINGS: Low lung volumes. The heart size and mediastinal contours are within normal limits. Both lungs are clear. The visualized skeletal structures are unremarkable.  IMPRESSION: No active cardiopulmonary disease.   Electronically Signed   By: Salome HolmesHector  Cooper M.D.   On: 10/15/2013 13:26    Jeoffrey MassedGHIMIRE,Colbert Curenton, MD  [facility] Pager:[phone]  If 7PM-7AM, please contact night-coverage [email or web address] Password [email or web address] 10/16/2013, 10:24 AM   LOS: 1 day

## 2013-10-17 ENCOUNTER — Other Ambulatory Visit: Payer: Self-pay

## 2013-10-17 ENCOUNTER — Observation Stay (HOSPITAL_COMMUNITY): Payer: Medicaid Other

## 2013-10-17 DIAGNOSIS — R079 Chest pain, unspecified: Secondary | ICD-10-CM

## 2013-10-17 MED ORDER — GEMFIBROZIL 600 MG PO TABS: 600.0000 mg | ORAL_TABLET | Freq: Two times a day (BID) | ORAL | Status: DC

## 2013-10-17 MED ORDER — TECHNETIUM TC 99M SESTAMIBI - CARDIOLITE
10.0000 | Freq: Once | INTRAVENOUS | Status: AC | PRN
  Administered 2013-10-17: 09:00:00 10 via INTRAVENOUS

## 2013-10-17 MED ORDER — TECHNETIUM TC 99M SESTAMIBI - CARDIOLITE
30.0000 | Freq: Once | INTRAVENOUS | Status: AC | PRN
  Administered 2013-10-17: 11:00:00 30 via INTRAVENOUS

## 2013-10-17 MED ORDER — AMLODIPINE BESYLATE 5 MG PO TABS
5.0000 mg | ORAL_TABLET | Freq: Every day | ORAL | Status: DC
Start: 2013-10-17 — End: 2013-11-12

## 2013-10-17 MED ORDER — ASPIRIN 81 MG PO TBEC: 81.0000 mg | DELAYED_RELEASE_TABLET | Freq: Every day | ORAL | Status: DC

## 2013-10-17 NOTE — Discharge Summary (Signed)
PATIENT DETAILS Name: Victor [name] Age: 43 y.o. Sex: male Date of Birth: 02-05-1971 MRN: [id number]. Admit Date: 10/15/2013 Admitting Physician: Jeralyn Bennett, MD PCP:No PCP Per Patient  Recommendations for Outpatient Follow-up:  1. Repeat Lipid panel in 3 months 2. Optimize antihypertensive therapy  PRIMARY DISCHARGE DIAGNOSIS:  Principal Problem:   Weakness Active Problems:   Dizziness   Abnormal EKG   Hypertriglyceridemia   Dyspnea      PAST MEDICAL HISTORY: Past Medical History  Diagnosis Date  . Dizzy 10/15/2013     "SWIMMY HEAD    "  . Hypertriglyceridemia   . Obesity     DISCHARGE MEDICATIONS:   Medication List    STOP taking these medications       ibuprofen 200 MG tablet  Commonly known as:  ADVIL,MOTRIN      TAKE these medications       amLODipine 5 MG tablet  Commonly known as:  NORVASC  Take 1 tablet (5 mg total) by mouth daily.     aspirin 81 MG EC tablet  Take 1 tablet (81 mg total) by mouth daily.     gemfibrozil 600 MG tablet  Commonly known as:  LOPID  Take 1 tablet (600 mg total) by mouth 2 (two) times daily before a meal.        ALLERGIES:  No Known Allergies  BRIEF HPI:  See H&P, Labs, Consult and Test reports for all details in brief, patient was admitted for evaluation of lightheadedness  CONSULTATIONS:   cardiology  PERTINENT RADIOLOGIC STUDIES: Dg Chest 2 View  10/15/2013   CLINICAL DATA:  Eval cardiac silohuette  EXAM: CHEST  2 VIEW  COMPARISON:  None.  FINDINGS: Low lung volumes. The heart size and mediastinal contours are within normal limits. Both lungs are clear. The visualized skeletal structures are unremarkable.  IMPRESSION: No active cardiopulmonary disease.   Electronically Signed   By: Salome Holmes M.D.   On: 10/15/2013 13:26   Ct Head Wo Contrast  10/16/2013   CLINICAL DATA:  Dizziness for 1 week.  Chest pain.  EXAM: CT HEAD WITHOUT CONTRAST  TECHNIQUE: Contiguous axial images were obtained from the base of  the skull through the vertex without contrast.  COMPARISON:  None.  FINDINGS: Normal appearance of the intracranial structures. No evidence for acute hemorrhage, mass lesion, midline shift, hydrocephalus or large infarct. No acute bony abnormality. The visualized sinuses are clear.  IMPRESSION: No acute intracranial abnormality.   Electronically Signed   By: Davonna Belling M.D.   On: 10/16/2013 13:08   Nm Myocar Multi W/spect W/wall Motion / Ef  10/17/2013   CLINICAL DATA:  Chest discomfort, weakness, dizziness and abnormal EKG.  EXAM: MYOCARDIAL IMAGING WITH SPECT (REST AND EXERCISE)  GATED LEFT VENTRICULAR WALL MOTION STUDY  LEFT VENTRICULAR EJECTION FRACTION  TECHNIQUE: Standard myocardial SPECT imaging was performed after resting intravenous injection of 10 mCi Tc-62m sestamibi. Subsequently, exercise tolerance test was performed by the patient under the supervision of the Cardiology staff. At peak-stress, 30 mCi Tc-74m sestamibi was injected intravenously and standard myocardial SPECT imaging was performed. Quantitative gated imaging was also performed to evaluate left ventricular wall motion, and estimate left ventricular ejection fraction.  COMPARISON:  None.  FINDINGS: Utilizing gated data, the end-diastolic volume is estimated to be 138 mL and the end systolic volume 73 mL. Calculated ejection fraction is 48%.  Gated wall motion analysis is essentially normal. There is suggestion of minimal hypokinesis involving the inferior wall and left  ventricular apex compared to the rest of the left ventricle.  SPECT imaging shows no evidence of inducible ischemia out with treadmill exercise. The patient achieved a maximal heart rate of 155 beats per min with treadmill exercise which is 88% of predicted maximum for age. On the SPECT images, there is mild attenuation of the inferior wall spanning from the midportion of the inferior wall to the base. This may be secondary to diaphragmatic attenuation. Component of scar  cannot be completely excluded.  IMPRESSION: 1. No evidence of inducible myocardial ischemia with treadmill exercise. 2. Suggestion of minimal hypokinesis involving the inferior wall and apex. Quantitative left ventricular ejection fraction is 48%. 3. Mild attenuation of the mid to basilar inferior wall. Diaphragmatic attenuation is favored. Scar is not completely excluded.   Electronically Signed   By: Irish LackGlenn  Yamagata M.D.   On: 10/17/2013 13:05     PERTINENT LAB RESULTS: CBC:  Recent Labs  10/15/13 1215 10/15/13 2037  WBC 6.7 6.7  HGB 15.1 13.5  HCT 43.7 39.8  PLT 223 187   CMET CMP     Component Value Date/Time   NA 137 10/16/2013 0854   K 3.9 10/16/2013 0854   CL 101 10/16/2013 0854   CO2 22 10/16/2013 0854   GLUCOSE 103* 10/16/2013 0854   BUN 11 10/16/2013 0854   CREATININE 0.95 10/16/2013 0854   CALCIUM 8.8 10/16/2013 0854   PROT 6.9 10/16/2013 0854   ALBUMIN 3.4* 10/16/2013 0854   AST 54* 10/16/2013 0854   ALT 74* 10/16/2013 0854   ALKPHOS 55 10/16/2013 0854   BILITOT 0.9 10/16/2013 0854   GFRNONAA >90 10/16/2013 0854   GFRAA >90 10/16/2013 0854    GFR Estimated Creatinine Clearance: 119.3 ml/min (by C-G formula based on Cr of 0.95). No results found for this basename: LIPASE, AMYLASE,  in the last 72 hours  Recent Labs  10/15/13 1229 10/15/13 2037 10/16/13 0449  TROPONINI <0.30 <0.30 <0.30   No components found with this basename: POCBNP,  No results found for this basename: DDIMER,  in the last 72 hours  Recent Labs  10/16/13 1114  HGBA1C 6.1*    Recent Labs  10/16/13 0449  CHOL 138  HDL 21*  LDLCALC UNABLE TO CALCULATE IF TRIGLYCERIDE OVER 400 mg/dL  TRIG [id number]*  CHOLHDL 6.6    Recent Labs  10/15/13 2037  TSH 2.164   No results found for this basename: VITAMINB12, FOLATE, FERRITIN, TIBC, IRON, RETICCTPCT,  in the last 72 hours Coags: No results found for this basename: PT, INR,  in the last 72 hours Microbiology: No results found for this or any  previous visit (from the past 240 hour(s)).   BRIEF HOSPITAL COURSE:  Lightheadedness  - Not sure what the exact etiology is. Orthostatic vitals were negative. CT head negative.Thankfully has resolved with supportive care - Telemetry essentially unremarkable, EKG with significant T wave inversions.Consulted Cardiology, Echo shows preserved EF, underwent Nuclear Stress test on 3/28, negative for ischemia  -Lightheadedness has completely resolved, since Nuc Stress test neg will discharge later today   Active Problems:  Hypertriglyceridemia  - started Lopid, A1C is 6.1  - Further optimization will need to be done in the outpatient setting.   Hypertension  - started on Amlodipine, further optimization can be done in the outpatient setting.   Abnormal EKG  - CT head negative.  - Nuclear Stress test negative.Continue to monitor and manage risk factors in the outpatient setting.  TODAY-DAY OF DISCHARGE:  Subjective:  Victor [name] today has no headache,no chest abdominal pain,no new weakness tingling or numbness, feels much better wants to go home today.   Objective:   Blood pressure 150/79, pulse 86, temperature 97.9 F (36.6 C), temperature source Oral, resp. rate 18, height 5\' 7"  (1.702 m), weight 113.399 kg (250 lb), SpO2 97.00%.  Intake/Output Summary (Last 24 hours) at 10/17/13 1349 Last data filed at 10/17/13 0845  Gross per 24 hour  Intake   2640 ml  Output      0 ml  Net   2640 ml   Filed Weights   10/15/13 1129 10/15/13 1635  Weight: 113.535 kg (250 lb 4.8 oz) 113.399 kg (250 lb)    Exam Awake Alert, Oriented *3, No new F.N deficits, Normal affect [place].AT,PERRAL Supple Neck,No JVD, No cervical lymphadenopathy appriciated.  Symmetrical Chest wall movement, Good air movement bilaterally, CTAB RRR,No Gallops,Rubs or new Murmurs, No Parasternal Heave +ve B.Sounds, Abd Soft, Non tender, No organomegaly appriciated, No rebound -guarding or rigidity. No Cyanosis, Clubbing  or edema, No new Rash or bruise  DISCHARGE CONDITION: Stable  DISPOSITION: SNF ALF  Home Home with home health services/[facility]  DISCHARGE INSTRUCTIONS:    Activity:  As tolerated with Full fall precautions use walker/cane & assistance as needed  Diet recommendation: Heart Healthy diet      Discharge Orders   Future Appointments Provider Department Dept Phone   11/09/2013 9:00 AM Doris Cheadle, MD [facility] [phone]   Future Orders Complete By Expires   Call MD for:  persistant dizziness or light-headedness  As directed    Call MD for:  persistant nausea and vomiting  As directed    Call MD for:  severe uncontrolled pain  As directed    Diet - low sodium heart healthy  As directed    Increase activity slowly  As directed       Follow-up Information   Follow up with [facility] On 11/09/2013. (9am- since [facility] an apt [facility] clinic can get medications filled there at discounted price before pharmacy closes.)    Contact information:   [address] [place] [address] [phone]      Total Time spent on discharge equals 45 minutes.  SignedJeoffrey Massed 10/17/2013 1:49 PM

## 2013-10-17 NOTE — Progress Notes (Addendum)
No note here,  Victor BonitoJeff [employer], MD

## 2013-10-17 NOTE — Progress Notes (Addendum)
Subjective: No chest pain, no SOB no dizziness  Objective: Vital signs in last 24 hours: Temp:  [97.9 F (36.6 C)-98.3 F (36.8 C)] 97.9 F (36.6 C) (03/28 0526) Pulse Rate:  [73-88] 77 (03/28 0948) Resp:  [18-20] 18 (03/28 0526) BP: (120-154)/(73-87) 138/73 mmHg (03/28 0948) SpO2:  [94 %-98 %] 97 % (03/28 0526) Weight change:  Last BM Date: 10/15/13 Intake/Output from previous day: 03/27 0701 - 03/28 0700 In: 1255 [P.O.:240; I.V.:1015] Out: -  Intake/Output this shift:    PE: General:Pleasant affect, NAD Skin:Warm and dry, brisk capillary refill to warm and damp after exercise HEENT:normocephalic, sclera clear, mucus membranes moist Neck:supple, no JVD, no bruits  Heart:S1S2 RRR with2/6 syst murmur, no gallup, rub or click Lungs:clear without rales, rhonchi, or wheezes ZOX:WRUE, non tender, + BS, do not palpate liver spleen or masses Ext:no lower ext edema, 2+ pedal pulses, 2+ radial pulses Neuro:alert and oriented, MAE, follows commands, + facial symmetry   Lab Results:  Recent Labs  10/15/13 1215 10/15/13 2037  WBC 6.7 6.7  HGB 15.1 13.5  HCT 43.7 39.8  PLT 223 187   BMET  Recent Labs  10/15/13 1215 10/15/13 2037 10/16/13 0854  NA 139  --  137  K 4.4  --  3.9  CL 101  --  101  CO2 21  --  22  GLUCOSE 98  --  103*  BUN 18  --  11  CREATININE 1.11 0.99 0.95  CALCIUM 8.8  --  8.8    Recent Labs  10/15/13 2037 10/16/13 0449  TROPONINI <0.30 <0.30    Lab Results  Component Value Date   CHOL 138 10/16/2013   HDL 21* 10/16/2013   LDLCALC UNABLE TO CALCULATE IF TRIGLYCERIDE OVER 400 mg/dL 4/54/0981   TRIG 191* 4/78/2956   CHOLHDL 6.6 10/16/2013   Lab Results  Component Value Date   HGBA1C 6.1* 10/16/2013     Lab Results  Component Value Date   TSH 2.164 10/15/2013    Hepatic Function Panel  Recent Labs  10/16/13 0854  PROT 6.9  ALBUMIN 3.4*  AST 54*  ALT 74*  ALKPHOS 55  BILITOT 0.9    Recent Labs  10/16/13 0449   CHOL 138   No results found for this basename: PROTIME,  in the last 72 hours     Studies/Results: Dg Chest 2 View  10/15/2013   CLINICAL DATA:  Eval cardiac silohuette  EXAM: CHEST  2 VIEW  COMPARISON:  None.  FINDINGS: Low lung volumes. The heart size and mediastinal contours are within normal limits. Both lungs are clear. The visualized skeletal structures are unremarkable.  IMPRESSION: No active cardiopulmonary disease.   Electronically Signed   By: Salome Holmes M.D.   On: 10/15/2013 13:26   Ct Head Wo Contrast  10/16/2013   CLINICAL DATA:  Dizziness for 1 week.  Chest pain.  EXAM: CT HEAD WITHOUT CONTRAST  TECHNIQUE: Contiguous axial images were obtained from the base of the skull through the vertex without contrast.  COMPARISON:  None.  FINDINGS: Normal appearance of the intracranial structures. No evidence for acute hemorrhage, mass lesion, midline shift, hydrocephalus or large infarct. No acute bony abnormality. The visualized sinuses are clear.  IMPRESSION: No acute intracranial abnormality.   Electronically Signed   By: Davonna Belling M.D.   On: 10/16/2013 13:08    Medications: I have reviewed the patient's current medications. Scheduled Meds: . amLODipine  2.5 mg Oral  Daily  . aspirin EC  325 mg Oral Daily  . enoxaparin (LOVENOX) injection  40 mg Subcutaneous Q24H  . gemfibrozil  600 mg Oral BID AC   Continuous Infusions: . sodium chloride 100 mL/hr at 10/17/13 0659   PRN Meds:.gi cocktail, morphine injection  Assessment/Plan: Principal Problem:   Weakness Active Problems:   Dizziness   Abnormal EKG, continued abnormal EKG, echo with normal EF, L atrium dilated.  Neg MI   Hypertriglyceridemia   Dyspnea, + with exercise stress  HR went from 77 to 122 immed on stepping on treadmill, HR remained easily elevated then BP elavated, 7 min to decrease HR and BP in recovery. No chest pain, no dizziness, +DOE resolving in recovery.  Nuc results to follow.  Needs outpt sleep  study.  The nuclear study reveals no scar or ischemia. There is question on the nuclear scan did the ejection fraction may be 48%. I believe this is not accurate. The echo showed vigorous left ventricular function. At this time no further inpatient cardiac workup is needed.  LOS: 2 days    [facility] RockNGOLD,LAURA R  Nurse Practitioner Certified Pager [phone] or after 5pm and on weekends call [phone] 10/17/2013, 10:37 AM  Patient seen and examined. I agree with the assessment and plan as detailed above. See also my additional thoughts below.   I agree with the note above. I have made some changes in the note. The nuclear study reveals no scar or ischemia. No further inpatient workup is needed.  Willa RoughJeffrey Jace Fermin, MD, [facility] 10/17/2013 1:27 PM

## 2013-10-17 NOTE — Progress Notes (Signed)
PATIENT DETAILS Name: Victor [name] Age: 43 y.o. Sex: male Date of Birth: 08/02/1970 Admit Date: 10/15/2013 Admitting Physician Jeralyn BennettEzequiel Zamora, MD PCP:No PCP Per Patient  Subjective: Lightheadedness has resolved  Assessment/Plan: Principal Problem: Lightheadedness - Not sure what the exact etiology is. Orthostatic vitals were negative. - Telemetry essentially unremarkable, EKG with significant T wave inversions.CT head negative.Consulted Cardiology, Echo shows preserved EF, underwent Nuclear Stress test on 3/28, currently pending. -Lightheadedness has completely resolved, if Nuc Stress test neg will discharge later today  Active Problems: Hypertriglyceridemia - started Lopid, A1C is 6.1 - Further optimization will need to be done in the outpatient setting.  Hypertension - started on Amlodipine, further optimization can be done in the outpatient setting.    Abnormal EKG -  CT head negative.  -await results of Nuclear Stress test  Disposition: Remain inpatient  DVT Prophylaxis: Prophylactic Lovenox   Code Status: Full code   Family Communication  none at bedside.   Procedures:   none  CONSULTS:  cardiology  MEDICATIONS: Scheduled Meds: . amLODipine  2.5 mg Oral Daily  . aspirin EC  325 mg Oral Daily  . enoxaparin (LOVENOX) injection  40 mg Subcutaneous Q24H  . gemfibrozil  600 mg Oral BID AC   Continuous Infusions: . sodium chloride 100 mL/hr at 10/17/13 0659   PRN Meds:.gi cocktail, morphine injection  Antibiotics: Anti-infectives   None       PHYSICAL EXAM: Vital signs in last 24 hours: Filed Vitals:   10/17/13 1046 10/17/13 1049 10/17/13 1050 10/17/13 1052  BP: 182/68 215/73 174/77 150/79  Pulse: 141 100 90 86  Temp:      TempSrc:      Resp:      Height:      Weight:      SpO2:        Weight change:  Filed Weights   10/15/13 1129 10/15/13 1635  Weight: 113.535 kg (250 lb 4.8 oz) 113.399 kg (250 lb)   Body mass index is  39.15 kg/(m^2).   Gen Exam: Awake and alert with clear speech.   Neck: Supple, No JVD.   Chest: B/L Clear.   CVS: S1 S2 Regular, no murmurs.  Abdomen: soft, BS +, non tender, non distended.  Extremities: no edema, lower extremities warm to touch. Neurologic: Non Focal.   Skin: No Rash.   Wounds: N/A.   Intake/Output from previous day:  Intake/Output Summary (Last 24 hours) at 10/17/13 1156 Last data filed at 10/16/13 2043  Gross per 24 hour  Intake   1230 ml  Output      0 ml  Net   1230 ml     LAB RESULTS: CBC  Recent Labs Lab 10/15/13 1215 10/15/13 2037  WBC 6.7 6.7  HGB 15.1 13.5  HCT 43.7 39.8  PLT 223 187  MCV 92.2 92.3  MCH 31.9 31.3  MCHC 34.6 33.9  RDW 14.5 14.6    Chemistries   Recent Labs Lab 10/15/13 1215 10/15/13 2037 10/16/13 0854  NA 139  --  137  K 4.4  --  3.9  CL 101  --  101  CO2 21  --  22  GLUCOSE 98  --  103*  BUN 18  --  11  CREATININE 1.11 0.99 0.95  CALCIUM 8.8  --  8.8    CBG:  Recent Labs Lab 10/15/13 1150  GLUCAP 104*    GFR Estimated Creatinine Clearance: 119.3 ml/min (by C-G formula  based on Cr of 0.95).  Coagulation profile No results found for this basename: INR, PROTIME,  in the last 168 hours  Cardiac Enzymes  Recent Labs Lab 10/15/13 1229 10/15/13 2037 10/16/13 0449  TROPONINI <0.30 <0.30 <0.30    No components found with this basename: POCBNP,  No results found for this basename: DDIMER,  in the last 72 hours  Recent Labs  10/16/13 1114  HGBA1C 6.1*    Recent Labs  10/16/13 0449  CHOL 138  HDL 21*  LDLCALC UNABLE TO CALCULATE IF TRIGLYCERIDE OVER 400 mg/dL  TRIG 098*  CHOLHDL 6.6    Recent Labs  10/15/13 2037  TSH 2.164   No results found for this basename: VITAMINB12, FOLATE, FERRITIN, TIBC, IRON, RETICCTPCT,  in the last 72 hours No results found for this basename: LIPASE, AMYLASE,  in the last 72 hours  Urine Studies No results found for this basename: UACOL, UAPR, USPG,  UPH, UTP, UGL, UKET, UBIL, UHGB, UNIT, UROB, ULEU, UEPI, UWBC, URBC, UBAC, CAST, CRYS, UCOM, BILUA,  in the last 72 hours  MICROBIOLOGY: No results found for this or any previous visit (from the past 240 hour(s)).  RADIOLOGY STUDIES/RESULTS: Dg Chest 2 View  10/15/2013   CLINICAL DATA:  Eval cardiac silohuette  EXAM: CHEST  2 VIEW  COMPARISON:  None.  FINDINGS: Low lung volumes. The heart size and mediastinal contours are within normal limits. Both lungs are clear. The visualized skeletal structures are unremarkable.  IMPRESSION: No active cardiopulmonary disease.   Electronically Signed   By: Salome Holmes M.D.   On: 10/15/2013 13:26    Jeoffrey Massed, MD  [facility] Pager:[phone]  If 7PM-7AM, please contact night-coverage [email or web address] Password [facility] 10/17/2013, 11:56 AM   LOS: 2 days

## 2013-10-17 NOTE — Progress Notes (Signed)
10/17/13 Patient being discharged today, IV site removed. Discharge instructions reviewed with patient.

## 2013-11-09 ENCOUNTER — Inpatient Hospital Stay: Payer: Self-pay | Admitting: Internal Medicine

## 2013-11-12 ENCOUNTER — Encounter: Payer: Self-pay | Admitting: Internal Medicine

## 2013-11-12 ENCOUNTER — Ambulatory Visit: Payer: Medicaid Other | Attending: Internal Medicine | Admitting: Internal Medicine

## 2013-11-12 VITALS — BP 151/90 | HR 88 | Temp 98.5°F | Resp 18 | Ht 66.0 in | Wt 243.0 lb

## 2013-11-12 DIAGNOSIS — F17200 Nicotine dependence, unspecified, uncomplicated: Secondary | ICD-10-CM | POA: Diagnosis not present

## 2013-11-12 DIAGNOSIS — E785 Hyperlipidemia, unspecified: Secondary | ICD-10-CM | POA: Insufficient documentation

## 2013-11-12 DIAGNOSIS — E781 Pure hyperglyceridemia: Secondary | ICD-10-CM | POA: Insufficient documentation

## 2013-11-12 DIAGNOSIS — Z760 Encounter for issue of repeat prescription: Secondary | ICD-10-CM | POA: Insufficient documentation

## 2013-11-12 DIAGNOSIS — R9431 Abnormal electrocardiogram [ECG] [EKG]: Secondary | ICD-10-CM | POA: Diagnosis not present

## 2013-11-12 DIAGNOSIS — I10 Essential (primary) hypertension: Secondary | ICD-10-CM | POA: Diagnosis not present

## 2013-11-12 MED ORDER — AMLODIPINE BESYLATE 5 MG PO TABS: 5.0000 mg | ORAL_TABLET | Freq: Every day | ORAL | Status: DC

## 2013-11-12 MED ORDER — GEMFIBROZIL 600 MG PO TABS: 600.0000 mg | ORAL_TABLET | Freq: Two times a day (BID) | ORAL | Status: DC

## 2013-11-12 NOTE — Patient Instructions (Signed)

## 2013-11-12 NOTE — Progress Notes (Signed)
Pt here to establish care for HFU- HTN and cholesterol Pt is taking prescribed Amlodipine 5 mg tab and Gemfibrozil 600 mg tab Need medication refills. Will use our pharmacy Bp- 151/90 88 informed pt he may med adjustment States this is his first time seeing a doctor since a child Denies h/a,swelling or numb/tingling

## 2013-11-12 NOTE — Progress Notes (Signed)
Patient ID: Victor [name], male   DOB: 01/17/1971, 43 y.o.   MRN: [id number]   NFA:[id number]  ION:[id number]  DOB - 10/01/1970  CC:  Chief Complaint  Patient presents with  . Establish Care  . Hypertension  . Hyperlipidemia       HPI: Victor [name] is a 43 y.o. male here today to establish medical care.  He was recently released from the ER with SOB, dizziness, and a abnormal EKG.  His EKG showed biatrial enlargement with a fascicular block.  Patient reports that he has not had any complications with chest pain, SOB, dizziness, weakness, numbness/tingling, or cough since hospital discharge and starting on his medication.  The patient is currently taking Norvasc 5mg , lopid 600 mg BID and aspirin 81 mg.  Patient admits that he does use chewing tobacco and he plans to quit one day.    No Known Allergies Past Medical History  Diagnosis Date  . Dizzy 10/15/2013     "SWIMMY HEAD    "  . Hypertriglyceridemia   . Obesity    Current Outpatient Prescriptions on File Prior to Visit  Medication Sig Dispense Refill  . amLODipine (NORVASC) 5 MG tablet Take 1 tablet (5 mg total) by mouth daily.  30 tablet  0  . aspirin EC 81 MG EC tablet Take 1 tablet (81 mg total) by mouth daily.  30 tablet  0  . gemfibrozil (LOPID) 600 MG tablet Take 1 tablet (600 mg total) by mouth 2 (two) times daily before a meal.  30 tablet  0   No current facility-administered medications on file prior to visit.   Family History  Problem Relation Age of Onset  . CAD Mother     stenting @ 5358; alive @ 5059  . Aneurysm Mother     thoracic s/p surgery @ 6159  . Other Father     unknown   History   Social History  . Marital Status: Single    Spouse Name: N/A    Number of Children: N/A  . Years of Education: N/A   Occupational History  . Not on file.   Social History Main Topics  . Smoking status: Never Smoker   . Smokeless tobacco: Current User    Types: Chew  . Alcohol Use: Yes     Comment: 4 12 oz beers twice/wk.    . Drug Use: No  . Sexual Activity: Not on file   Other Topics Concern  . Not on file   Social History Narrative   [place]  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    Review of Systems: Constitutional: Negative for fever, chills, diaphoresis, activity change, appetite change and fatigue. HENT: Negative for ear pain, nosebleeds, congestion, facial swelling, rhinorrhea, neck pain, neck stiffness and ear discharge.  Eyes: Negative for pain, discharge, redness, itching and visual disturbance. Respiratory: Negative for cough, choking, chest tightness, shortness of breath, wheezing and stridor.  Cardiovascular: Negative for chest pain, palpitations and leg swelling. Gastrointestinal: Negative for abdominal distention. Genitourinary: Negative for dysuria, urgency, frequency, hematuria, flank pain, decreased urine volume, difficulty urinating and dyspareunia.  Musculoskeletal: Negative for back pain, joint swelling, arthralgia and gait problem. Neurological: Negative for dizziness, tremors, seizures, syncope, facial asymmetry, speech difficulty, weakness, light-headedness, numbness and headaches.  Hematological: Negative for adenopathy. Does not bruise/bleed easily. Psychiatric/Behavioral: Negative for hallucinations, behavioral problems, confusion, dysphoric mood, decreased concentration and agitation.    Objective:   Filed Vitals:   11/12/13  1110  BP: 151/90  Pulse: 88  Temp: 98.5 F (36.9 C)  Resp: 18    Physical Exam: Constitutional: Patient appears well-developed and well-nourished. No distress. HENT: Normocephalic, atraumatic, External right and left ear normal. Oropharynx is clear and moist. +poor dentition  Eyes: Conjunctivae and EOM are normal. PERRLA, no scleral icterus. Neck: Normal ROM. Neck supple. No JVD. No tracheal deviation. No thyromegaly. CVS: RRR, S1/S2 +,  no gallops, no carotid bruit. + murmur   Pulmonary: Effort and breath sounds normal,  no stridor, rhonchi, wheezes, rales.  Abdominal: Soft. BS +, no distension, tenderness, rebound or guarding.  Musculoskeletal: Normal range of motion. No edema and no tenderness.  Lymphadenopathy: No lymphadenopathy noted, cervical Neuro: Alert. Normal reflexes, muscle tone coordination. No cranial nerve deficit. Skin: Skin is warm and dry. No rash noted. Not diaphoretic. No erythema. No pallor. Psychiatric: Normal mood and affect. Behavior, judgment, thought content normal.  Lab Results  Component Value Date   WBC 6.7 10/15/2013   HGB 13.5 10/15/2013   HCT 39.8 10/15/2013   MCV 92.3 10/15/2013   PLT 187 10/15/2013   Lab Results  Component Value Date   CREATININE 0.95 10/16/2013   BUN 11 10/16/2013   NA 137 10/16/2013   K 3.9 10/16/2013   CL 101 10/16/2013   CO2 22 10/16/2013    Lab Results  Component Value Date   HGBA1C 6.1* 10/16/2013   Lipid Panel     Component Value Date/Time   CHOL 138 10/16/2013 0449   TRIG 928* 10/16/2013 0449   HDL 21* 10/16/2013 0449   CHOLHDL 6.6 10/16/2013 0449   VLDL UNABLE TO CALCULATE IF TRIGLYCERIDE OVER 400 mg/dL 1/61/09603/27/2015 [id number]   LDLCALC UNABLE TO CALCULATE IF TRIGLYCERIDE OVER 400 mg/dL 9/81/19143/27/2015 [id number]       Assessment and plan:   Victor Moneyony was seen today for establish care, hypertension and hyperlipidemia.  Diagnoses and associated orders for this visit:  HTN (hypertension) Continue current medication regimen, will reevaluate in 3 months - amLODipine (NORVASC) 5 MG tablet; Take 1 tablet (5 mg total) by mouth daily.   Patient counseled on dash diet and exercise. Explained the benefits of lifestyle modifications on hypertension.  Offered patient support for tobacco cessation and counseled on the effects of tobacco use and health. Total time counseling 10 minutes.  Hypertriglyceridemia - gemfibrozil (LOPID) 600 MG tablet; Take 1 tablet (600 mg total) by mouth 2 (two) times daily before a meal. Will repeat blood work in 3 months at f/u visit  Tobacco  use disorder  Nonspecific abnormal electrocardiogram (ECG) (EKG) Patient will follow up with Dr. Daleen SquibbWall for abnormal EKG from hospital.   Return in about 1 week (around 11/19/2013) for with Dr. Daleen SquibbWall for abnormal EKG, 3 month f/u with provider for repeat bloodwork, Abdominal Pain.  The patient was given clear instructions to go to ER or return to medical center if symptoms don't improve, worsen or new problems develop. The patient verbalized understanding.   Holland CommonsValerie Darnell Jeschke, NP-C [facility] [phone] 11/12/2013, 9:31 PM

## 2013-11-25 ENCOUNTER — Ambulatory Visit: Payer: Self-pay | Admitting: Cardiology

## 2014-02-03 ENCOUNTER — Ambulatory Visit: Payer: Medicaid Other | Attending: Internal Medicine | Admitting: Internal Medicine

## 2014-02-03 ENCOUNTER — Encounter: Payer: Self-pay | Admitting: Internal Medicine

## 2014-02-03 VITALS — BP 138/82 | HR 100 | Temp 98.3°F | Resp 17 | Wt 247.0 lb

## 2014-02-03 DIAGNOSIS — Z7982 Long term (current) use of aspirin: Secondary | ICD-10-CM | POA: Insufficient documentation

## 2014-02-03 DIAGNOSIS — E781 Pure hyperglyceridemia: Secondary | ICD-10-CM | POA: Diagnosis not present

## 2014-02-03 DIAGNOSIS — E669 Obesity, unspecified: Secondary | ICD-10-CM | POA: Diagnosis not present

## 2014-02-03 DIAGNOSIS — I10 Essential (primary) hypertension: Secondary | ICD-10-CM | POA: Diagnosis present

## 2014-02-03 DIAGNOSIS — Z79899 Other long term (current) drug therapy: Secondary | ICD-10-CM | POA: Insufficient documentation

## 2014-02-03 MED ORDER — GEMFIBROZIL 600 MG PO TABS: 600.0000 mg | ORAL_TABLET | Freq: Two times a day (BID) | ORAL | Status: DC

## 2014-02-03 MED ORDER — AMLODIPINE BESYLATE 5 MG PO TABS: 5.0000 mg | ORAL_TABLET | Freq: Every day | ORAL | Status: DC

## 2014-02-03 NOTE — Progress Notes (Signed)
Patient here for follow up on his HTN Needs blood pressure medication refilled

## 2014-02-03 NOTE — Progress Notes (Signed)
Patient ID: Victor [name], male   DOB: 09/17/1970, 43 y.o.   MRN: [id number]  CC: HTN, HLD  HPI:  Patient presents today for a 3 month follow up of hypertension and hyperlipidemia.  Patient reports medication compliance.  He states that he randomly checks his BP at the local pharmacy and it is usually elevated.  Patient reports that he has changed his diet and has cut out fried foods.  He also reports significant improvement in dizziness and balance since beginning amlodipine.        No Known Allergies Past Medical History  Diagnosis Date  . Dizzy 10/15/2013     "SWIMMY HEAD    "  . Hypertriglyceridemia   . Obesity    Current Outpatient Prescriptions on File Prior to Visit  Medication Sig Dispense Refill  . amLODipine (NORVASC) 5 MG tablet Take 1 tablet (5 mg total) by mouth daily.  30 tablet  2  . aspirin EC 81 MG EC tablet Take 1 tablet (81 mg total) by mouth daily.  30 tablet  0  . gemfibrozil (LOPID) 600 MG tablet Take 1 tablet (600 mg total) by mouth 2 (two) times daily before a meal.  30 tablet  2   No current facility-administered medications on file prior to visit.   Family History  Problem Relation Age of Onset  . CAD Mother     stenting @ 5058; alive @ 3359  . Aneurysm Mother     thoracic s/p surgery @ 4459  . Other Father     unknown   History   Social History  . Marital Status: Single    Spouse Name: N/A    Number of Children: N/A  . Years of Education: N/A   Occupational History  . Not on file.   Social History Main Topics  . Smoking status: Never Smoker   . Smokeless tobacco: Current User    Types: Chew  . Alcohol Use: Yes     Comment: 4 12 oz beers twice/wk.  . Drug Use: No  . Sexual Activity: Not on file   Other Topics Concern  . Not on file   Social History Narrative   [place].  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   Review of Systems  All other systems reviewed and are negative.     Objective:   Filed Vitals:   02/03/14  0925  BP: 162/90  Pulse: 100  Temp: 98.3 F (36.8 C)  Resp: 17   Physical Exam  Vitals reviewed. Neck: Normal range of motion. Neck supple.  Cardiovascular: Normal rate.   Murmur heard. Pulmonary/Chest: Effort normal.  Abdominal: Soft.  Skin: Skin is warm and dry.  Psychiatric: He has a normal mood and affect.     Lab Results  Component Value Date   WBC 6.7 10/15/2013   HGB 13.5 10/15/2013   HCT 39.8 10/15/2013   MCV 92.3 10/15/2013   PLT 187 10/15/2013   Lab Results  Component Value Date   CREATININE 0.95 10/16/2013   BUN 11 10/16/2013   NA 137 10/16/2013   K 3.9 10/16/2013   CL 101 10/16/2013   CO2 22 10/16/2013    Lab Results  Component Value Date   HGBA1C 6.1* 10/16/2013   Lipid Panel     Component Value Date/Time   CHOL 138 10/16/2013 0449   TRIG 928* 10/16/2013 0449   HDL 21* 10/16/2013 0449   CHOLHDL 6.6 10/16/2013 0449  VLDL UNABLE TO CALCULATE IF TRIGLYCERIDE OVER 400 mg/dL 1/61/0960 4540   LDLCALC UNABLE TO CALCULATE IF TRIGLYCERIDE OVER 400 mg/dL 9/81/1914 7829       Assessment and plan:   Kam was seen today for follow-up.  Diagnoses and associated orders for this visit:  Essential hypertension - amLODipine (NORVASC) 5 MG tablet; Take 1 tablet (5 mg total) by mouth daily. Patient will return in 1 week for a BP check, will adjust medication then if needed.   Hypertriglyceridemia - gemfibrozil (LOPID) 600 MG tablet; Take 1 tablet (600 mg total) by mouth 2 (two) times daily before a meal. Will recheck lipid panel in 3 months at next visit.    Return for 1 week BP check, 3 mo PCP.        Holland Commons, NP-C [facility] [phone] 02/03/2014, 10:06 AM

## 2014-02-03 NOTE — Patient Instructions (Signed)
DASH Eating Plan  DASH stands for "Dietary Approaches to Stop Hypertension." The DASH eating plan is a healthy eating plan that has been shown to reduce high blood pressure (hypertension). Additional health benefits may include reducing the risk of type 2 diabetes mellitus, heart disease, and stroke. The DASH eating plan may also help with weight loss.  WHAT DO I NEED TO KNOW ABOUT THE DASH EATING PLAN?  For the DASH eating plan, you will follow these general guidelines:  · Choose foods with a percent daily value for sodium of less than 5% (as listed on the food label).  · Use salt-free seasonings or herbs instead of table salt or sea salt.  · Check with your health care provider or pharmacist before using salt substitutes.  · Eat lower-sodium products, often labeled as "lower sodium" or "no salt added."  · Eat fresh foods.  · Eat more vegetables, fruits, and low-fat dairy products.  · Choose whole grains. Look for the word "whole" as the first word in the ingredient list.  · Choose fish and skinless chicken or turkey more often than red meat. Limit fish, poultry, and meat to 6 oz (170 g) each day.  · Limit sweets, desserts, sugars, and sugary drinks.  · Choose heart-healthy fats.  · Limit cheese to 1 oz (28 g) per day.  · Eat more home-cooked food and less restaurant, buffet, and fast food.  · Limit fried foods.  · Cook foods using methods other than frying.  · Limit canned vegetables. If you do use them, rinse them well to decrease the sodium.  · When eating at a restaurant, ask that your food be prepared with less salt, or no salt if possible.  WHAT FOODS CAN I EAT?  Seek help from a dietitian for individual calorie needs.  Grains  Whole grain or whole wheat bread. Brown rice. Whole grain or whole wheat pasta. Quinoa, bulgur, and whole grain cereals. Low-sodium cereals. Corn or whole wheat flour tortillas. Whole grain cornbread. Whole grain crackers. Low-sodium crackers.  Vegetables  Fresh or frozen vegetables  (raw, steamed, roasted, or grilled). Low-sodium or reduced-sodium tomato and vegetable juices. Low-sodium or reduced-sodium tomato sauce and paste. Low-sodium or reduced-sodium canned vegetables.   Fruits  All fresh, canned (in natural juice), or frozen fruits.  Meat and Other Protein Products  Ground beef (85% or leaner), grass-fed beef, or beef trimmed of fat. Skinless chicken or turkey. Ground chicken or turkey. Pork trimmed of fat. All fish and seafood. Eggs. Dried beans, peas, or lentils. Unsalted nuts and seeds. Unsalted canned beans.  Dairy  Low-fat dairy products, such as skim or 1% milk, 2% or reduced-fat cheeses, low-fat ricotta or cottage cheese, or plain low-fat yogurt. Low-sodium or reduced-sodium cheeses.  Fats and Oils  Tub margarines without trans fats. Light or reduced-fat mayonnaise and salad dressings (reduced sodium). Avocado. Safflower, olive, or canola oils. Natural peanut or almond butter.  Other  Unsalted popcorn and pretzels.  The items listed above may not be a complete list of recommended foods or beverages. Contact your dietitian for more options.  WHAT FOODS ARE NOT RECOMMENDED?  Grains  White bread. White pasta. White rice. Refined cornbread. Bagels and croissants. Crackers that contain trans fat.  Vegetables  Creamed or fried vegetables. Vegetables in a cheese sauce. Regular canned vegetables. Regular canned tomato sauce and paste. Regular tomato and vegetable juices.  Fruits  Dried fruits. Canned fruit in light or heavy syrup. Fruit juice.  Meat and Other Protein   Products  Fatty cuts of meat. Ribs, chicken wings, bacon, sausage, bologna, salami, chitterlings, fatback, hot dogs, bratwurst, and packaged luncheon meats. Salted nuts and seeds. Canned beans with salt.  Dairy  Whole or 2% milk, cream, half-and-half, and cream cheese. Whole-fat or sweetened yogurt. Full-fat cheeses or blue cheese. Nondairy creamers and whipped toppings. Processed cheese, cheese spreads, or cheese  curds.  Condiments  Onion and garlic salt, seasoned salt, table salt, and sea salt. Canned and packaged gravies. Worcestershire sauce. Tartar sauce. Barbecue sauce. Teriyaki sauce. Soy sauce, including reduced sodium. Steak sauce. Fish sauce. Oyster sauce. Cocktail sauce. Horseradish. Ketchup and mustard. Meat flavorings and tenderizers. Bouillon cubes. Hot sauce. Tabasco sauce. Marinades. Taco seasonings. Relishes.  Fats and Oils  Butter, stick margarine, lard, shortening, ghee, and bacon fat. Coconut, palm kernel, or palm oils. Regular salad dressings.  Other  Pickles and olives. Salted popcorn and pretzels.  The items listed above may not be a complete list of foods and beverages to avoid. Contact your dietitian for more information.  WHERE CAN I FIND MORE INFORMATION?  National Heart, Lung, and [employer] Institute: www.nhlbi.nih.gov/health/health-topics/topics/dash/  Document Released: 06/28/2011 Document Revised: 07/14/2013 Document Reviewed: 05/13/2013  ExitCare® Patient Information ©2015 [employer]. This information is not intended to replace advice given to you by your health care provider. Make sure you discuss any questions you have with your health care provider.

## 2014-02-10 ENCOUNTER — Ambulatory Visit: Payer: Medicaid Other | Attending: Internal Medicine | Admitting: *Deleted

## 2014-02-10 VITALS — BP 133/85 | HR 86 | Resp 16

## 2014-02-10 DIAGNOSIS — I10 Essential (primary) hypertension: Secondary | ICD-10-CM

## 2014-02-10 MED ORDER — AMLODIPINE BESYLATE 10 MG PO TABS: 10.0000 mg | ORAL_TABLET | Freq: Every day | ORAL | Status: DC

## 2014-02-10 NOTE — Progress Notes (Signed)
Patient here for BP check. Patient denies any blurred vision, dizziness, or headaches. Notified Cletus GashV. Keck, NP of current BP 141/89. Per Cletus GashV. Keck, NP patient will sit 10 more minutes and recheck BP. If BP is still elevated verbal order received to increase Norvasc from 5 mg to 10 mg. Informed patient of plan of care. Patient verbalized agreement. Norvasc was increased to 10 mg.

## 2014-02-10 NOTE — Patient Instructions (Addendum)
Hypertension Hypertension, commonly called high blood pressure, is when the force of blood pumping through your arteries is too strong. Your arteries are the blood vessels that carry blood from your heart throughout your body. A blood pressure reading consists of a higher number over a lower number, such as 110/72. The higher number (systolic) is the pressure inside your arteries when your heart pumps. The lower number (diastolic) is the pressure inside your arteries when your heart relaxes. Ideally you want your blood pressure below 120/80. Hypertension forces your heart to work harder to pump blood. Your arteries may become narrow or stiff. Having hypertension puts you at risk for heart disease, stroke, and other problems.  RISK FACTORS Some risk factors for high blood pressure are controllable. Others are not.  Risk factors you cannot control include:   Race. You may be at higher risk if you are African American.  Age. Risk increases with age.  Gender. Men are at higher risk than women before age 45 years. After age 65, women are at higher risk than men. Risk factors you can control include:  Not getting enough exercise or physical activity.  Being overweight.  Getting too much fat, sugar, calories, or salt in your diet.  Drinking too much alcohol. SIGNS AND SYMPTOMS Hypertension does not usually cause signs or symptoms. Extremely high blood pressure (hypertensive crisis) may cause headache, anxiety, shortness of breath, and nosebleed. DIAGNOSIS  To check if you have hypertension, your health care provider will measure your blood pressure while you are seated, with your arm held at the level of your heart. It should be measured at least twice using the same arm. Certain conditions can cause a difference in blood pressure between your right and left arms. A blood pressure reading that is higher than normal on one occasion does not mean that you need treatment. If one blood pressure reading  is high, ask your health care provider about having it checked again. TREATMENT  Treating high blood pressure includes making lifestyle changes and possibly taking medication. Living a healthy lifestyle can help lower high blood pressure. You may need to change some of your habits. Lifestyle changes may include:  Following the DASH diet. This diet is high in fruits, vegetables, and whole grains. It is low in salt, red meat, and added sugars.  Getting at least 2 1/2 hours of brisk physical activity every week.  Losing weight if necessary.  Not smoking.  Limiting alcoholic beverages.  Learning ways to reduce stress. If lifestyle changes are not enough to get your blood pressure under control, your health care provider may prescribe medicine. You may need to take more than one. Work closely with your health care provider to understand the risks and benefits. HOME CARE INSTRUCTIONS  Have your blood pressure rechecked as directed by your health care provider.   Only take medicine as directed by your health care provider. Follow the directions carefully. Blood pressure medicines must be taken as prescribed. The medicine does not work as well when you skip doses. Skipping doses also puts you at risk for problems.   Do not smoke.   Monitor your blood pressure at home as directed by your health care provider. SEEK MEDICAL CARE IF:   You think you are having a reaction to medicines taken.  You have recurrent headaches or feel dizzy.  You have swelling in your ankles.  You have trouble with your vision. SEEK IMMEDIATE MEDICAL CARE IF:  You develop a severe headache or   confusion.  You have unusual weakness, numbness, or feel faint.  You have severe chest or abdominal pain.  You vomit repeatedly.  You have trouble breathing. MAKE SURE YOU:   Understand these instructions.  Will watch your condition.  Will get help right away if you are not doing well or get  worse. Document Released: 07/09/2005 Document Revised: 07/14/2013 Document Reviewed: 05/01/2013 [facility] Patient Information 2015 [place], [place]. This information is not intended to replace advice given to you by your health care provider. Make sure you discuss any questions you have with your health care provider.   Your PCP has increased your Norvasc from 5 mg daily to 10 mg daily. Start tomorrow 02/11/2014 taking 10 mg of Norvasc.

## 2014-02-18 ENCOUNTER — Ambulatory Visit: Payer: Medicaid Other | Attending: Internal Medicine | Admitting: *Deleted

## 2014-02-18 VITALS — BP 145/75 | HR 100

## 2014-02-18 DIAGNOSIS — I10 Essential (primary) hypertension: Secondary | ICD-10-CM

## 2014-02-18 NOTE — Progress Notes (Signed)
Patient ID: Victor [name], male   DOB: 03/16/1971, 43 y.o.   MRN: [id number] Patient here today for BP check. Patient was increased from 5 mg of Norvasc to 10 mg of Norvasc during his last nurse visit. Patient states he did not take his medication today because he ran out. Patient states he will go to the pharmacy and get his refill when he leaves here. Patient denies any blurred vision, headaches, or chest pain. Annamaria Hellingose,Falicity Sheets Renee, RN

## 2014-02-18 NOTE — Patient Instructions (Signed)

## 2014-03-03 ENCOUNTER — Ambulatory Visit: Payer: Self-pay | Attending: Internal Medicine | Admitting: *Deleted

## 2014-03-03 VITALS — BP 127/79 | HR 105

## 2014-03-03 DIAGNOSIS — I10 Essential (primary) hypertension: Secondary | ICD-10-CM

## 2014-03-03 NOTE — Patient Instructions (Signed)

## 2014-03-03 NOTE — Progress Notes (Signed)
Patient here for BP check. Patient denies any headaches, blurred vision, or chest pain.

## 2014-05-03 ENCOUNTER — Ambulatory Visit: Payer: Medicaid Other | Admitting: Internal Medicine

## 2014-05-06 ENCOUNTER — Ambulatory Visit: Payer: Self-pay | Attending: Internal Medicine | Admitting: Internal Medicine

## 2014-05-06 ENCOUNTER — Encounter: Payer: Self-pay | Admitting: Internal Medicine

## 2014-05-06 VITALS — BP 130/68 | HR 100 | Temp 98.0°F | Resp 18 | Ht 71.0 in | Wt 237.4 lb

## 2014-05-06 DIAGNOSIS — I10 Essential (primary) hypertension: Secondary | ICD-10-CM

## 2014-05-06 DIAGNOSIS — E669 Obesity, unspecified: Secondary | ICD-10-CM | POA: Insufficient documentation

## 2014-05-06 DIAGNOSIS — M79601 Pain in right arm: Secondary | ICD-10-CM

## 2014-05-06 DIAGNOSIS — Z7982 Long term (current) use of aspirin: Secondary | ICD-10-CM | POA: Insufficient documentation

## 2014-05-06 DIAGNOSIS — E781 Pure hyperglyceridemia: Secondary | ICD-10-CM

## 2014-05-06 DIAGNOSIS — Z720 Tobacco use: Secondary | ICD-10-CM | POA: Insufficient documentation

## 2014-05-06 DIAGNOSIS — Z6833 Body mass index (BMI) 33.0-33.9, adult: Secondary | ICD-10-CM | POA: Insufficient documentation

## 2014-05-06 MED ORDER — GEMFIBROZIL 600 MG PO TABS: 600.0000 mg | ORAL_TABLET | Freq: Two times a day (BID) | ORAL | Status: DC

## 2014-05-06 MED ORDER — ASPIRIN 81 MG PO TBEC: 81.0000 mg | DELAYED_RELEASE_TABLET | Freq: Every day | ORAL | Status: DC

## 2014-05-06 MED ORDER — TRAMADOL HCL 50 MG PO TABS: 50.0000 mg | ORAL_TABLET | Freq: Three times a day (TID) | ORAL | Status: DC | PRN

## 2014-05-06 MED ORDER — AMLODIPINE BESYLATE 10 MG PO TABS: 10.0000 mg | ORAL_TABLET | Freq: Every day | ORAL | Status: DC

## 2014-05-06 NOTE — Progress Notes (Signed)
Patient ID: Victor [name], male   DOB: 02/20/1971, 43 y.o.   MRN: [id number]  CC: hypertenson  HPI:  Patient presents to clinic for a follow up of hypertension and right arm pain for four weeks.  Arm has been achy, denies injury.  No loss of ROM.  Aggravated by nothing.  Been taking tylenol for pain.  Reports that his dizzy spells have improved since BP has been controlled.  He admits to baking all foods, eating out less, and avoiding salt.   No Known Allergies Past Medical History  Diagnosis Date  . Dizzy 10/15/2013     "SWIMMY HEAD    "  . Hypertriglyceridemia   . Obesity   . Hypertension    Current Outpatient Prescriptions on File Prior to Visit  Medication Sig Dispense Refill  . amLODipine (NORVASC) 10 MG tablet Take 1 tablet (10 mg total) by mouth daily.  30 tablet  2  . aspirin EC 81 MG EC tablet Take 1 tablet (81 mg total) by mouth daily.  30 tablet  0  . gemfibrozil (LOPID) 600 MG tablet Take 1 tablet (600 mg total) by mouth 2 (two) times daily before a meal.  30 tablet  2   No current facility-administered medications on file prior to visit.   Family History  Problem Relation Age of Onset  . CAD Mother     stenting @ 3058; alive @ 2659  . Aneurysm Mother     thoracic s/p surgery @ 5059  . Other Father     unknown   History   Social History  . Marital Status: Single    Spouse Name: N/A    Number of Children: N/A  . Years of Education: N/A   Occupational History  . Not on file.   Social History Main Topics  . Smoking status: Never Smoker   . Smokeless tobacco: Current User    Types: Chew  . Alcohol Use: Yes     Comment: 4 12 oz beers twice/wk.  . Drug Use: No  . Sexual Activity: Not on file   Other Topics Concern  . Not on file   Social History Narrative   [place]  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    Review of Systems: Constitutional: Negative for fever, chills, diaphoresis, activity change, appetite change and fatigue. HENT:  Negative for ear pain, nosebleeds, congestion, facial swelling, rhinorrhea, neck pain, neck stiffness and ear discharge.  Eyes: Negative for pain, discharge, redness, itching and visual disturbance. Respiratory: Negative for cough, choking, chest tightness, shortness of breath, wheezing and stridor.  Cardiovascular: Negative for chest pain, palpitations and leg swelling. Gastrointestinal: Negative for abdominal distention. Genitourinary: Negative for dysuria, urgency, frequency, hematuria, flank pain, decreased urine volume, difficulty urinating and dyspareunia.  Musculoskeletal: Negative for back pain, joint swelling, arthralgias and gait problem. Neurological: Negative for dizziness, tremors, seizures, syncope, facial asymmetry, speech difficulty, weakness, light-headedness, numbness and headaches.  Hematological: Negative for adenopathy. Does not bruise/bleed easily. Psychiatric/Behavioral: Negative for hallucinations, behavioral problems, confusion, dysphoric mood, decreased concentration and agitation.    Objective:   Filed Vitals:   05/06/14 1238  BP: 130/68  Pulse: 100  Temp: 98 F (36.7 C)  Resp: 18    Physical Exam  Constitutional: He is oriented to person, place, and time.  Cardiovascular: Normal rate and regular rhythm.   Murmur heard. Pulmonary/Chest: Effort normal and breath sounds normal.  Abdominal: Soft. Bowel sounds are normal.  Musculoskeletal: Normal  range of motion. He exhibits no edema.  Neurological: He is alert and oriented to person, place, and time.   .  Lab Results  Component Value Date   WBC 6.7 10/15/2013   HGB 13.5 10/15/2013   HCT 39.8 10/15/2013   MCV 92.3 10/15/2013   PLT 187 10/15/2013   Lab Results  Component Value Date   CREATININE 0.95 10/16/2013   BUN 11 10/16/2013   NA 137 10/16/2013   K 3.9 10/16/2013   CL 101 10/16/2013   CO2 22 10/16/2013    Lab Results  Component Value Date   HGBA1C 6.1* 10/16/2013   Lipid Panel     Component  Value Date/Time   CHOL 138 10/16/2013 0449   TRIG 928* 10/16/2013 0449   HDL 21* 10/16/2013 0449   CHOLHDL 6.6 10/16/2013 0449   VLDL UNABLE TO CALCULATE IF TRIGLYCERIDE OVER 400 mg/dL 1/61/09603/27/2015 [id number]   LDLCALC UNABLE TO CALCULATE IF TRIGLYCERIDE OVER 400 mg/dL 9/81/19143/27/2015 [id number]       Assessment and plan:   Alinda Moneyony was seen today for follow-up, hyperlipidemia and hypertension.  Diagnoses and associated orders for this visit:  Essential hypertension - Continue amLODipine (NORVASC) 10 MG tablet; Take 1 tablet (10 mg total) by mouth daily. - aspirin 81 MG EC tablet; Take 1 tablet (81 mg total) by mouth daily.  Hypertriglyceridemia - Continue gemfibrozil (LOPID) 600 MG tablet; Take 1 tablet (600 mg total) by mouth 2 (two) times daily before a meal. Will recheck cholesterol levels on next visit for possible d/c  Right arm pain - traMADol (ULTRAM) 50 MG tablet; Take 1 tablet (50 mg total) by mouth every 8 (eight) hours as needed.  Follow up in 3 months.      Holland CommonsKECK, Luanna Weesner, NP-C [facility] [phone] 05/09/2014, 11:01 PM

## 2014-05-06 NOTE — Progress Notes (Signed)
Patient presents for f/u on hyperlipidemia and HTN Needs med refills C/o right arm pain for 4 weeks; rates 8/10 at present Denies injury Declined flu vaccine

## 2014-05-09 ENCOUNTER — Encounter: Payer: Self-pay | Admitting: Internal Medicine

## 2014-11-16 ENCOUNTER — Other Ambulatory Visit: Payer: Self-pay | Admitting: Internal Medicine

## 2014-11-16 DIAGNOSIS — I10 Essential (primary) hypertension: Secondary | ICD-10-CM

## 2014-11-16 NOTE — Telephone Encounter (Signed)
Victor [name] please refill for patient. Thanks

## 2014-11-16 NOTE — Telephone Encounter (Signed)
Patient called to schedule appt, we do not have any available until 11/29/14 and patient only has 2 pills of amLODipine (NORVASC) 10 MG tablet left. Could we supply him enough until then. Please f/u with patient, pt uses our pharmacy .

## 2014-11-17 MED ORDER — AMLODIPINE BESYLATE 10 MG PO TABS: 10.0000 mg | ORAL_TABLET | Freq: Every day | ORAL | Status: DC

## 2014-11-17 NOTE — Telephone Encounter (Signed)
Refilled amlodipine 

## 2014-12-06 ENCOUNTER — Ambulatory Visit: Payer: Self-pay | Attending: Internal Medicine | Admitting: Internal Medicine

## 2014-12-06 ENCOUNTER — Encounter: Payer: Self-pay | Admitting: Internal Medicine

## 2014-12-06 VITALS — BP 129/79 | HR 97 | Temp 98.4°F | Ht 67.0 in | Wt 238.0 lb

## 2014-12-06 DIAGNOSIS — I10 Essential (primary) hypertension: Secondary | ICD-10-CM

## 2014-12-06 DIAGNOSIS — E781 Pure hyperglyceridemia: Secondary | ICD-10-CM

## 2014-12-06 MED ORDER — GEMFIBROZIL 600 MG PO TABS: 600.0000 mg | ORAL_TABLET | Freq: Two times a day (BID) | ORAL | Status: DC

## 2014-12-06 MED ORDER — AMLODIPINE BESYLATE 10 MG PO TABS: 10.0000 mg | ORAL_TABLET | Freq: Every day | ORAL | Status: DC

## 2014-12-06 NOTE — Patient Instructions (Signed)

## 2014-12-06 NOTE — Progress Notes (Signed)
Patient here to follow up on HTN, Hyperlipidemia He needs refills

## 2014-12-06 NOTE — Progress Notes (Signed)
Patient ID: Victor [name], male   DOB: 12/22/1970, 44 y.o.   MRN: [id number] Subjective:  Victor [name] is a 44 y.o. male with hypertension and hypertriglyceridemia. Patient is currently taking gemfibrozil and amlodipine.   Current Outpatient Prescriptions  Medication Sig Dispense Refill  . amLODipine (NORVASC) 10 MG tablet Take 1 tablet (10 mg total) by mouth daily. 30 tablet 0  . aspirin 81 MG EC tablet Take 1 tablet (81 mg total) by mouth daily. 30 tablet 4  . gemfibrozil (LOPID) 600 MG tablet Take 1 tablet (600 mg total) by mouth 2 (two) times daily before a meal. (Patient not taking: Reported on 12/06/2014) 30 tablet 4  . traMADol (ULTRAM) 50 MG tablet Take 1 tablet (50 mg total) by mouth every 8 (eight) hours as needed. (Patient not taking: Reported on 12/06/2014) 60 tablet 0   No current facility-administered medications for this visit.    Hypertension ROS: taking medications as instructed, no medication side effects noted, no TIA's, no chest pain on exertion, no dyspnea on exertion, no swelling of ankles and no palpitations.  New concerns: Has cut back on fried foods. He only drinks alcohol on Sundays while watching the race.   Objective:  BP 129/79 mmHg  Pulse 97  Temp(Src) 98.4 F (36.9 C)  Ht 5\' 7"  (1.702 m)  Wt 238 lb (107.956 kg)  BMI 37.27 kg/m2  SpO2 98%  Appearance alert, well appearing, and in no distress and overweight. General exam BP noted to be well controlled today in office, S1, S2 normal, no gallop, no murmur, chest clear, no JVD, no HSM, no edema.  Lab review: orders written for new lab studies as appropriate; see orders.   Assessment:   Hypertension well controlled. Hypertriglyceridemia needs further investigation. Explained to patient that I need him to return for repeat labs   Plan:  Current treatment plan is effective, no change in therapy. Reviewed diet, exercise and weight control. Recommended sodium restriction.   Return in about 2 days (around 12/08/2014)  for Lab Visit and 6 mo PCP Hypertension.   Holland CommonsKECK, VALERIE, NP 12/06/2014 10:36 AM

## 2014-12-08 ENCOUNTER — Other Ambulatory Visit: Payer: Medicaid Other

## 2014-12-27 ENCOUNTER — Ambulatory Visit: Payer: Medicaid Other | Attending: Internal Medicine

## 2014-12-27 DIAGNOSIS — I10 Essential (primary) hypertension: Secondary | ICD-10-CM

## 2014-12-27 DIAGNOSIS — E781 Pure hyperglyceridemia: Secondary | ICD-10-CM

## 2014-12-27 LAB — COMPLETE METABOLIC PANEL WITH GFR
ALT: 51 U/L (ref 0–53)
AST: 53 U/L — ABNORMAL HIGH (ref 0–37)
Albumin: 3.6 g/dL (ref 3.5–5.2)
Alkaline Phosphatase: 53 U/L (ref 39–117)
BUN: 13 mg/dL (ref 6–23)
CHLORIDE: 104 meq/L (ref 96–112)
CO2: 23 mEq/L (ref 19–32)
CREATININE: 0.86 mg/dL (ref 0.50–1.35)
Calcium: 9.2 mg/dL (ref 8.4–10.5)
GFR, Est African American: >89 mL/min
GLUCOSE: 115 mg/dL — AB (ref 70–99)
Potassium: 3.9 mEq/L (ref 3.5–5.3)
Sodium: 137 mEq/L (ref 135–145)
Total Bilirubin: 0.5 mg/dL (ref 0.2–1.2)
Total Protein: 6.6 g/dL (ref 6.0–8.3)

## 2014-12-27 LAB — CBC
HEMATOCRIT: 38.7 % — AB (ref 39.0–52.0)
HEMOGLOBIN: 13.1 g/dL (ref 13.0–17.0)
MCH: 30.8 pg (ref 26.0–34.0)
MCHC: 33.9 g/dL (ref 30.0–36.0)
MCV: 91.1 fL (ref 78.0–100.0)
MPV: 8.9 fL (ref 8.6–12.4)
PLATELETS: 223 10*3/uL (ref 150–400)
RBC: 4.25 MIL/uL (ref 4.22–5.81)
RDW: 14.9 % (ref 11.5–15.5)
WBC: 4.7 10*3/uL (ref 4.0–10.5)

## 2014-12-27 LAB — LIPID PANEL
Cholesterol: 120 mg/dL (ref 0–200)
HDL: 30 mg/dL — AB (ref >=40)
LDL CALC: 33 mg/dL (ref 0–99)
Total CHOL/HDL Ratio: 4 Ratio
Triglycerides: 287 mg/dL — ABNORMAL HIGH (ref <150)
VLDL: 57 mg/dL — AB (ref 0–40)

## 2014-12-27 LAB — HEMOGLOBIN A1C
Hgb A1c MFr Bld: 6.5 % — ABNORMAL HIGH (ref <5.7)
Mean Plasma Glucose: 140 mg/dL — ABNORMAL HIGH (ref <117)

## 2015-01-07 ENCOUNTER — Telehealth: Payer: Self-pay | Admitting: Internal Medicine

## 2015-01-07 NOTE — Telephone Encounter (Signed)
Patient called to request his blood work results, please f/u with pt. °

## 2015-01-17 ENCOUNTER — Telehealth: Payer: Self-pay

## 2015-01-17 NOTE — Telephone Encounter (Signed)
Second attempt to reach patient. Left HIPAA compliant message with patient's grandmother to return call

## 2015-01-17 NOTE — Telephone Encounter (Signed)
Left HIPAA compliant message with patient's grandmother to return call      Notes Recorded by Ambrose FinlandValerie A Keck, NP on 12/31/2014 at 11:05 AM Triglycerides have come down a great deal but it is still elevated. I want him to continue taking gemfibrozil. Please explain excess sugar, alcohol, fried foods, carbs can elevated his levels. He is also at the borderline of being diagnosed with diabetes. He needs to make serious lifestyle changes or he will be placed on diabetes medication on next visit. So exercise, lose weight in stomach area, cut out sugars, and cut back on carbs significantly.

## 2015-01-17 NOTE — Telephone Encounter (Signed)
-----   Message from Ambrose FinlandValerie A Keck, NP sent at 12/31/2014 11:05 AM EDT ----- Triglycerides have come down a great deal but it is still elevated. I want him to continue taking gemfibrozil. Please explain excess sugar, alcohol, fried foods, carbs can elevated his levels. He is also at the borderline of being diagnosed with diabetes. He needs to make serious lifestyle changes or he will be placed on diabetes medication on next visit. So exercise, lose weight in stomach area, cut out sugars, and cut back on carbs significantly.

## 2015-01-18 NOTE — Telephone Encounter (Signed)
Labs have been given to patient.

## 2015-07-07 ENCOUNTER — Ambulatory Visit: Payer: Medicaid Other | Admitting: Internal Medicine

## 2015-08-08 ENCOUNTER — Other Ambulatory Visit: Payer: Self-pay | Admitting: Internal Medicine

## 2015-08-08 MED FILL — AMLODIPINE BESYLATE 10 MG T: 10 | 30 days supply | Qty: 30 | Fill #0

## 2015-08-08 MED FILL — GEMFIBROZIL 600 MG TABLET: 600 | 15 days supply | Qty: 30 | Fill #2

## 2015-10-04 ENCOUNTER — Telehealth: Payer: Self-pay | Admitting: Internal Medicine

## 2015-10-04 ENCOUNTER — Other Ambulatory Visit: Payer: Self-pay | Admitting: Internal Medicine

## 2015-10-04 MED FILL — GEMFIBROZIL 600 MG TABLET: 600 | 15 days supply | Qty: 30 | Fill #3

## 2015-10-04 MED FILL — AMLODIPINE BESYLATE 10 MG T: 10 | 30 days supply | Qty: 30 | Fill #0

## 2015-10-04 NOTE — Telephone Encounter (Signed)
Patient called requesting a medication refill for Amlodipine, Gemfibrozil. Please follow up.

## 2015-10-05 ENCOUNTER — Telehealth: Payer: Self-pay

## 2015-10-05 DIAGNOSIS — E781 Pure hyperglyceridemia: Secondary | ICD-10-CM

## 2015-10-05 MED ORDER — GEMFIBROZIL 600 MG PO TABS: 600.0000 mg | ORAL_TABLET | Freq: Two times a day (BID) | ORAL | Status: DC

## 2015-10-05 NOTE — Telephone Encounter (Signed)
Returned patient phone call Patient not available Message left on voice mail to return our call Refills sent to pharmacy on file 

## 2015-10-17 ENCOUNTER — Encounter: Payer: Self-pay | Admitting: Internal Medicine

## 2015-10-17 ENCOUNTER — Ambulatory Visit: Payer: Self-pay | Attending: Internal Medicine | Admitting: Internal Medicine

## 2015-10-17 VITALS — BP 135/81 | HR 93 | Temp 98.4°F | Resp 18 | Ht 67.0 in | Wt 230.0 lb

## 2015-10-17 DIAGNOSIS — E781 Pure hyperglyceridemia: Secondary | ICD-10-CM | POA: Insufficient documentation

## 2015-10-17 DIAGNOSIS — I10 Essential (primary) hypertension: Secondary | ICD-10-CM | POA: Insufficient documentation

## 2015-10-17 DIAGNOSIS — Z79899 Other long term (current) drug therapy: Secondary | ICD-10-CM | POA: Insufficient documentation

## 2015-10-17 DIAGNOSIS — Z7982 Long term (current) use of aspirin: Secondary | ICD-10-CM | POA: Insufficient documentation

## 2015-10-17 LAB — BASIC METABOLIC PANEL
BUN: 14 mg/dL (ref 7–25)
CALCIUM: 9.2 mg/dL (ref 8.6–10.3)
CO2: 21 mmol/L (ref 20–31)
Chloride: 105 mmol/L (ref 98–110)
Creat: 0.83 mg/dL (ref 0.60–1.35)
Glucose, Bld: 103 mg/dL — ABNORMAL HIGH (ref 65–99)
POTASSIUM: 3.9 mmol/L (ref 3.5–5.3)
SODIUM: 137 mmol/L (ref 135–146)

## 2015-10-17 LAB — LIPID PANEL
CHOL/HDL RATIO: 3.3 ratio (ref <=5.0)
CHOLESTEROL: 140 mg/dL (ref 125–200)
HDL: 43 mg/dL (ref >=40)
LDL Cholesterol: 44 mg/dL (ref <130)
Triglycerides: 265 mg/dL — ABNORMAL HIGH (ref <150)
VLDL: 53 mg/dL — ABNORMAL HIGH (ref <30)

## 2015-10-17 MED ORDER — AMLODIPINE BESYLATE 10 MG PO TABS: ORAL_TABLET | ORAL | Status: DC

## 2015-10-17 MED ORDER — GEMFIBROZIL 600 MG PO TABS: 600.0000 mg | ORAL_TABLET | Freq: Two times a day (BID) | ORAL | Status: DC

## 2015-10-17 NOTE — Progress Notes (Signed)
Patient ID: Victor [name], male   DOB: 07/09/1971, 45 y.o.   MRN: [id number] Subjective:  Victor [name] is a 45 y.o. male with hypertension. Current Outpatient Prescriptions  Medication Sig Dispense Refill  . amLODipine (NORVASC) 10 MG tablet TAKE 1 TABLET BY MOUTH DAILY. MUST HAVE OFFICE VISIT FOR REFILLS 30 tablet 0  . aspirin 81 MG EC tablet Take 1 tablet (81 mg total) by mouth daily. 30 tablet 4  . gemfibrozil (LOPID) 600 MG tablet Take 1 tablet (600 mg total) by mouth 2 (two) times daily before a meal. 30 tablet 0  . traMADol (ULTRAM) 50 MG tablet Take 1 tablet (50 mg total) by mouth every 8 (eight) hours as needed. (Patient not taking: Reported on 12/06/2014) 60 tablet 0   No current facility-administered medications for this visit.    Hypertension ROS: taking medications as instructed, no medication side effects noted, no TIA's, no chest pain on exertion, no dyspnea on exertion, no swelling of ankles and no palpitations.   Objective:  Ht 5\' 7"  (1.702 m)  Wt 230 lb (104.327 kg)  BMI 36.01 kg/m2  Appearance alert, well appearing, and in no distress, oriented to person, place, and time and overweight. General exam BP noted to be well controlled today in office Physical Exam  Constitutional: He is oriented to person, place, and time.  Neck: No JVD present.  Cardiovascular: Normal rate and regular rhythm.   Murmur heard. Pulmonary/Chest: Effort normal and breath sounds normal.  Musculoskeletal: He exhibits no edema.  Neurological: He is alert and oriented to person, place, and time.   Lab review: orders written for new lab studies as appropriate; see orders.   Assessment:   Victor [name] was seen today for follow-up and hypertension.  Diagnoses and all orders for this visit:  Essential hypertension -     amLODipine (NORVASC) 10 MG tablet; TAKE 1 TABLET BY MOUTH DAILY. -     Basic Metabolic Panel  Patient blood pressure is stable and may continue on current medication.  Education on diet,  exercise, and modifiable risk factors discussed. Will obtain appropriate labs as needed. Will follow up in 3-6 months.   Hypertriglyceridemia -     gemfibrozil (LOPID) 600 MG tablet; Take 1 tablet (600 mg total) by mouth 2 (two) times daily before a meal. -     Lipid panel Will recheck today   Plan:  Current treatment plan is effective, no change in therapy. Lab results and schedule of future lab studies reviewed with patient. Reviewed diet, exercise and weight control. Recommended sodium restriction. Use of aspirin to prevent MI and TIA's discussed.    Return in about 6 months (around 04/18/2016) for Hypertension.  Ambrose FinlandValerie A Keck, NP 10/17/2015 9:53 AM

## 2015-10-17 NOTE — Progress Notes (Signed)
Patient's here f/up HTN.  Patinet reports feeling good today, and denies any pain today  Patient reports taking med this morning.

## 2015-10-18 ENCOUNTER — Telehealth: Payer: Self-pay

## 2015-10-18 NOTE — Telephone Encounter (Signed)
-----   Message from Ambrose FinlandValerie A Keck, NP sent at 10/17/2015  8:30 PM EDT ----- Triglycerides are still elevated. Continue Gemfibrozil.

## 2015-10-18 NOTE — Telephone Encounter (Signed)
Tried to contact patient Patient not available Message left with family member to have him return our call

## 2015-10-19 ENCOUNTER — Telehealth: Payer: Self-pay

## 2015-10-19 ENCOUNTER — Telehealth: Payer: Self-pay | Admitting: Internal Medicine

## 2015-10-19 NOTE — Telephone Encounter (Signed)
Returned patient phone call Patient is aware of his lab results 

## 2015-10-19 NOTE — Telephone Encounter (Signed)
Patient returning phone call, please f/u

## 2015-11-21 MED FILL — GEMFIBROZIL 600 MG TABLET: 600 | 30 days supply | Qty: 60 | Fill #4

## 2015-11-21 MED FILL — AMLODIPINE BESYLATE 10 MG T: 10 | 30 days supply | Qty: 30 | Fill #1

## 2016-02-06 MED FILL — GEMFIBROZIL 600 MG TABLET: 600 | 30 days supply | Qty: 60 | Fill #0

## 2016-02-06 MED FILL — AMLODIPINE BESYLATE 10 MG T: 10 | 30 days supply | Qty: 30 | Fill #0

## 2016-05-07 MED FILL — AMLODIPINE BESYLATE 10 MG T: 10 | 30 days supply | Qty: 30 | Fill #1

## 2016-05-07 MED FILL — GEMFIBROZIL 600 MG TABLET: 600 | 30 days supply | Qty: 60 | Fill #1

## 2016-07-18 ENCOUNTER — Telehealth: Payer: Self-pay | Admitting: General Practice

## 2016-07-18 DIAGNOSIS — E781 Pure hyperglyceridemia: Secondary | ICD-10-CM

## 2016-07-18 DIAGNOSIS — I10 Essential (primary) hypertension: Secondary | ICD-10-CM

## 2016-07-18 MED ORDER — AMLODIPINE BESYLATE 10 MG PO TABS
ORAL_TABLET | ORAL | 0 refills | Status: DC
Start: 2016-07-18 — End: 2016-07-24

## 2016-07-18 MED ORDER — GEMFIBROZIL 600 MG PO TABS: 600.0000 mg | ORAL_TABLET | Freq: Two times a day (BID) | ORAL | 0 refills | Status: DC

## 2016-07-18 MED FILL — AMLODIPINE BESYLATE 10 MG T: 10 | 30 days supply | Qty: 30 | Fill #2

## 2016-07-18 MED FILL — GEMFIBROZIL 600 MG TABLET: 600 | 30 days supply | Qty: 60 | Fill #2

## 2016-07-18 NOTE — Telephone Encounter (Signed)
Patient needs a refill for medications, amlodipine, gemfibrozil. Patient aware he needs to make appt. Appt schedule on 1/2

## 2016-07-18 NOTE — Telephone Encounter (Signed)
Requested medications refilled. Patient must have office visit for further refills.

## 2016-07-24 ENCOUNTER — Other Ambulatory Visit: Payer: Self-pay | Admitting: Family Medicine

## 2016-07-24 ENCOUNTER — Ambulatory Visit: Payer: Self-pay | Attending: Family Medicine | Admitting: Family Medicine

## 2016-07-24 ENCOUNTER — Encounter: Payer: Self-pay | Admitting: Family Medicine

## 2016-07-24 VITALS — BP 135/83 | HR 95 | Temp 98.2°F | Resp 18 | Ht 66.0 in | Wt 234.0 lb

## 2016-07-24 DIAGNOSIS — Z8669 Personal history of other diseases of the nervous system and sense organs: Secondary | ICD-10-CM

## 2016-07-24 DIAGNOSIS — Z7982 Long term (current) use of aspirin: Secondary | ICD-10-CM | POA: Insufficient documentation

## 2016-07-24 DIAGNOSIS — R1013 Epigastric pain: Secondary | ICD-10-CM | POA: Insufficient documentation

## 2016-07-24 DIAGNOSIS — H538 Other visual disturbances: Secondary | ICD-10-CM | POA: Insufficient documentation

## 2016-07-24 DIAGNOSIS — E781 Pure hyperglyceridemia: Secondary | ICD-10-CM | POA: Insufficient documentation

## 2016-07-24 DIAGNOSIS — I10 Essential (primary) hypertension: Secondary | ICD-10-CM | POA: Insufficient documentation

## 2016-07-24 DIAGNOSIS — Z8719 Personal history of other diseases of the digestive system: Secondary | ICD-10-CM

## 2016-07-24 DIAGNOSIS — E785 Hyperlipidemia, unspecified: Secondary | ICD-10-CM | POA: Insufficient documentation

## 2016-07-24 MED ORDER — AMLODIPINE BESYLATE 10 MG PO TABS: ORAL_TABLET | ORAL | 2 refills | Status: DC

## 2016-07-24 MED ORDER — GEMFIBROZIL 600 MG PO TABS: 600.0000 mg | ORAL_TABLET | Freq: Two times a day (BID) | ORAL | 2 refills | Status: DC

## 2016-07-24 MED ORDER — RANITIDINE HCL 150 MG PO TABS: 150.0000 mg | ORAL_TABLET | Freq: Two times a day (BID) | ORAL | 1 refills | Status: DC

## 2016-07-24 MED FILL — ?RANITIDINE 150 MG TABLET: 150 MG | 30 days supply | Qty: 60 | Fill #0

## 2016-07-24 NOTE — Progress Notes (Signed)
Subjective:  Patient ID: Victor [name], male    DOB: 07-28-1970  Age: 46 y.o. MRN: [id number]  CC: Hypertension and Hyperlipidemia   HPI Victor [name] comes to the office to establish care for hypertension, denies any headaches, dizziness, SOB, CP, or swelling of the extremities. Blood pressure monitor at home SBP range 135-140's. Non-smoker. 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 He can't remember the last time he has seen the ophthalmologist. Does have complaints of epigastric pain 8/10 after eating fried meals.   Outpatient Medications Prior to Visit  Medication Sig Dispense Refill  . aspirin 81 MG EC tablet Take 1 tablet (81 mg total) by mouth daily. 30 tablet 4  . amLODipine (NORVASC) 10 MG tablet TAKE 1 TABLET BY MOUTH DAILY. 30 tablet 0  . gemfibrozil (LOPID) 600 MG tablet Take 1 tablet (600 mg total) by mouth 2 (two) times daily before a meal. 60 tablet 0  . traMADol (ULTRAM) 50 MG tablet Take 1 tablet (50 mg total) by mouth every 8 (eight) hours as needed. (Patient not taking: Reported on 07/24/2016) 60 tablet 0   No facility-administered medications prior to visit.    ROS Review of Systems  Eyes: Positive for visual disturbance.  Respiratory: Negative.   Cardiovascular: Negative.   Gastrointestinal: Positive for abdominal pain.  Neurological: Negative for dizziness, light-headedness and headaches.   Objective:  BP 135/83 (BP Location: Right Arm, Patient Position: Sitting, Cuff Size: Normal)   Pulse 95   Temp 98.2 F (36.8 C) (Oral)   Resp 18   Ht 5\' 6"  (1.676 m)   Wt 234 lb (106.1 kg)   SpO2 97%   BMI 37.77 kg/m   BP/Weight 07/24/2016 10/17/2015 12/06/2014  Systolic BP 135 135 129  Diastolic BP 83 81 79  Wt. (Lbs) 234 230 238  BMI 37.77 36.01 37.27   Physical Exam  Constitutional: He is oriented to person, place, and time. He appears well-developed and well-nourished.  Eyes: Conjunctivae are normal. Pupils are  equal, round, and reactive to light.  Neck: Normal range of motion. No JVD present.  Cardiovascular: Normal rate, regular rhythm, normal heart sounds and intact distal pulses.   Pulmonary/Chest: Effort normal and breath sounds normal.  Abdominal: Soft. Bowel sounds are normal. He exhibits no mass. There is no tenderness.  Neurological: He is alert and oriented to person, place, and time.   Assessment & Plan:   Problem List Items Addressed This Visit      Cardiovascular and Mediastinum   HTN (hypertension) - Primary   Relevant Medications   amLODipine (NORVASC) 10 MG tablet   Other Relevant Orders   Lipid panel   Basic Metabolic Panel   Microalbumin/Creatinine Ratio, Urine     Other   Hypertriglyceridemia   Relevant Medications   gemfibrozil (LOPID) 600 MG tablet    Other Visit Diagnoses    History of epigastric pain       Relevant Medications   ranitidine (ZANTAC) 150 MG tablet   -Hepatic function panel    History of blurred vision       -Vision screen   Relevant Orders   Ambulatory referral to Ophthalmology     Meds ordered this encounter  Medications  . amLODipine (NORVASC) 10 MG tablet    Sig: TAKE 1 TABLET BY MOUTH DAILY.    Dispense:  30 tablet    Refill:  2    Order Specific  Question:   Supervising Provider    Answer:   Quentin AngstJEGEDE, OLUGBEMIGA E [[id number]]  . ranitidine (ZANTAC) 150 MG tablet    Sig: Take 1 tablet (150 mg total) by mouth 2 (two) times daily.    Dispense:  60 tablet    Refill:  1    Order Specific Question:   Supervising Provider    Answer:   Quentin AngstJEGEDE, OLUGBEMIGA E [id number]  . gemfibrozil (LOPID) 600 MG tablet    Sig: Take 1 tablet (600 mg total) by mouth 2 (two) times daily before a meal.    Dispense:  60 tablet    Refill:  2    Must have office visit for refills    Order Specific Question:   Supervising Provider    Answer:   Quentin AngstJEGEDE, OLUGBEMIGA E [[id number]]    Follow-up: Return in about 3 months (around 10/22/2016) for Hypertension & Cholesterol  .   Lizbeth BarkMandesia R Hairston FNP

## 2016-07-24 NOTE — Progress Notes (Signed)
Patient is here to re-establish care for HTN  Patient denies pain at this time.  Patient declined the flu vaccine today.

## 2016-07-24 NOTE — Patient Instructions (Addendum)
Follow up in 1 week BP check with nurse and labs.  Cholesterol Cholesterol is a fat. Your body needs a small amount of cholesterol. Cholesterol (plaque) may build up in your blood vessels (arteries). That makes you more likely to have a heart attack or stroke. You cannot feel your cholesterol level. Having a blood test is the only way to find out if your level is high. Keep your test results. Work with your doctor to keep your cholesterol at a good level. What do the results mean?  Total cholesterol is how much cholesterol is in your blood.  LDL is bad cholesterol. This is the type that can build up. Try to have low LDL.  HDL is good cholesterol. It cleans your blood vessels and carries LDL away. Try to have high HDL.  Triglycerides are fat that the body can store or burn for energy. What are good levels of cholesterol?  Total cholesterol below 200.  LDL below 100 is good for people who have health risks. LDL below 70 is good for people who have very high risks.  HDL above 40 is good. It is best to have HDL of 60 or higher.  Triglycerides below 150. How can I lower my cholesterol? Diet  Follow your diet program as told by your doctor.  Choose fish, white meat chicken, or [place] that is roasted or baked. Try not to eat red meat, fried foods, sausage, or lunch meats.  Eat lots of fresh fruits and vegetables.  Choose whole grains, beans, pasta, potatoes, and cereals.  Choose olive oil, corn oil, or canola oil. Only use small amounts.  Try not to eat butter, mayonnaise, shortening, or palm kernel oils.  Try not to eat foods with trans fats.  Choose low-fat or nonfat dairy foods.  Drink skim or nonfat milk.  Eat low-fat or nonfat yogurt and cheeses.  Try not to drink whole milk or cream.  Try not to eat ice cream, egg yolks, or full-fat cheeses.  Healthy desserts include angel food cake, ginger snaps, animal crackers, hard candy, popsicles, and low-fat or nonfat frozen  yogurt. Try not to eat pastries, cakes, pies, and cookies. Exercise  Follow your exercise program as told by your doctor.  Be more active. Try gardening, walking, and taking the stairs.  Ask your doctor about ways that you can be more active. Medicine  Take over-the-counter and prescription medicines only as told by your doctor. This information is not intended to replace advice given to you by your health care provider. Make sure you discuss any questions you have with your health care provider. Document Released: 10/05/2008 Document Revised: 02/08/2016 Document Reviewed: 01/19/2016 Elsevier Interactive Patient Education  2017 [employer] Inc. Hypertension Hypertension is another name for high blood pressure. High blood pressure forces your heart to work harder to pump blood. A blood pressure reading has two numbers, which includes a higher number over a lower number (example: 110/72). Follow these instructions at home:  Have your blood pressure rechecked by your doctor.  Only take medicine as told by your doctor. Follow the directions carefully. The medicine does not work as well if you skip doses. Skipping doses also puts you at risk for problems.  Do not smoke.  Monitor your blood pressure at home as told by your doctor. Contact a doctor if:  You think you are having a reaction to the medicine you are taking.  You have repeat headaches or feel dizzy.  You have puffiness (swelling) in your ankles.  You have trouble with your vision. Get help right away if:  You get a very bad headache and are confused.  You feel weak, numb, or faint.  You get chest or belly (abdominal) pain.  You throw up (vomit).  You cannot breathe very well. This information is not intended to replace advice given to you by your health care provider. Make sure you discuss any questions you have with your health care provider. Document Released: 12/26/2007 Document Revised: 12/15/2015 Document  Reviewed: 05/01/2013 Elsevier Interactive Patient Education  2017 [employer]

## 2016-07-25 LAB — MICROALBUMIN / CREATININE URINE RATIO
CREATININE, URINE: 256 mg/dL (ref 20–370)
MICROALB UR: 0.7 mg/dL
MICROALB/CREAT RATIO: 3 ug/mg{creat} (ref <30)

## 2016-07-30 ENCOUNTER — Ambulatory Visit: Payer: Medicaid Other | Attending: Family Medicine

## 2016-07-30 DIAGNOSIS — I10 Essential (primary) hypertension: Secondary | ICD-10-CM | POA: Insufficient documentation

## 2016-07-30 DIAGNOSIS — R1013 Epigastric pain: Secondary | ICD-10-CM

## 2016-07-30 LAB — HEPATIC FUNCTION PANEL
ALBUMIN: 3.5 g/dL — AB (ref 3.6–5.1)
ALK PHOS: 47 U/L (ref 40–115)
ALT: 35 U/L (ref 9–46)
AST: 44 U/L — AB (ref 10–40)
BILIRUBIN DIRECT: 0.1 mg/dL (ref <=0.2)
BILIRUBIN TOTAL: 0.5 mg/dL (ref 0.2–1.2)
Indirect Bilirubin: 0.4 mg/dL (ref 0.2–1.2)
Total Protein: 6.7 g/dL (ref 6.1–8.1)

## 2016-07-30 LAB — BASIC METABOLIC PANEL
BUN: 16 mg/dL (ref 7–25)
CALCIUM: 9.1 mg/dL (ref 8.6–10.3)
CO2: 23 mmol/L (ref 20–31)
Chloride: 106 mmol/L (ref 98–110)
Creat: 0.95 mg/dL (ref 0.60–1.35)
Glucose, Bld: 113 mg/dL — ABNORMAL HIGH (ref 65–99)
POTASSIUM: 3.7 mmol/L (ref 3.5–5.3)
SODIUM: 139 mmol/L (ref 135–146)

## 2016-07-30 LAB — LIPID PANEL
CHOL/HDL RATIO: 3.2 ratio (ref <5.0)
Cholesterol: 113 mg/dL (ref <200)
HDL: 35 mg/dL — ABNORMAL LOW (ref >40)
Triglycerides: 443 mg/dL — ABNORMAL HIGH (ref <150)

## 2016-07-30 NOTE — Progress Notes (Signed)
Patient here for lab visit only 

## 2016-08-06 ENCOUNTER — Other Ambulatory Visit: Payer: Self-pay | Admitting: Family Medicine

## 2016-08-06 ENCOUNTER — Telehealth: Payer: Self-pay

## 2016-08-06 DIAGNOSIS — E781 Pure hyperglyceridemia: Secondary | ICD-10-CM

## 2016-08-06 NOTE — Telephone Encounter (Signed)
-----   Message from Lizbeth BarkMandesia R Hairston, FNP sent at 08/06/2016  4:12 AM EST ----- -Liver function test is normal. -Triglycerides level were elevated. High Triglycerides can increase your risk of heart disease. Take your gemfibrozil as prescribed to lower these levels. -Start eating a diet low in saturated fat. Limit your intake of fried foods, red meats, and whole milk.Begin exercising at least 3-5 times per week for at least 30 minutes. Will need to recheck levels again in 3 months.

## 2016-08-06 NOTE — Telephone Encounter (Signed)
CMA call to go over lab results  His grandma answer left a message with her just to let him know to call us back that we have his lab results ready.

## 2016-08-06 NOTE — Telephone Encounter (Signed)
Pt returning call from nurse regarding lab results  Requesting call back at [phone]

## 2016-08-07 NOTE — Telephone Encounter (Signed)
CMA call again to the number he left on 08/06/16 to call back no answer

## 2016-08-10 ENCOUNTER — Telehealth: Payer: Self-pay | Admitting: Family Medicine

## 2016-08-10 NOTE — Telephone Encounter (Signed)
Pt returning call from nurse concerning results Pt states if he does not answer to leave a message

## 2016-08-13 NOTE — Telephone Encounter (Signed)
Liver function test is normal. -Triglycerides level were elevated. High Triglycerides can increase your risk of heart disease. Take your gemfibrozil as prescribed to lower these levels. -Start eating a diet low in saturated fat. Limit your intake of fried foods, red meats, and whole milk.Begin exercising at least 3-5 times per week for at least 30 minutes. Will need to recheck levels again in 3 months.   Patient Verify DOB  Patient was aware and understood his lab results

## 2016-10-08 MED FILL — GEMFIBROZIL 600 MG TABLET: 600 | 30 days supply | Qty: 60 | Fill #0

## 2016-10-08 MED FILL — ?RANITIDINE 150 MG TABLET: 150 MG | 30 days supply | Qty: 60 | Fill #1

## 2016-10-08 MED FILL — ?AMLODIPINE BESYLATE 10 MG: 10 | 30 days supply | Qty: 30 | Fill #0

## 2016-11-13 MED FILL — GEMFIBROZIL 600 MG TABLET: 600 | 30 days supply | Qty: 60 | Fill #1

## 2016-11-13 MED FILL — ?AMLODIPINE BESYLATE 10 MG: 10 | 30 days supply | Qty: 30 | Fill #1

## 2016-12-19 ENCOUNTER — Other Ambulatory Visit: Payer: Self-pay | Admitting: Family Medicine

## 2016-12-19 DIAGNOSIS — E781 Pure hyperglyceridemia: Secondary | ICD-10-CM

## 2016-12-19 DIAGNOSIS — I10 Essential (primary) hypertension: Secondary | ICD-10-CM

## 2016-12-19 MED FILL — GEMFIBROZIL 600 MG TABLET: 600 | 30 days supply | Qty: 60 | Fill #2

## 2016-12-19 MED FILL — ?AMLODIPINE BESYLATE 10 MG: 10 | 30 days supply | Qty: 30 | Fill #2

## 2017-01-08 ENCOUNTER — Other Ambulatory Visit: Payer: Self-pay | Admitting: Family Medicine

## 2017-01-08 DIAGNOSIS — Z8719 Personal history of other diseases of the digestive system: Secondary | ICD-10-CM

## 2017-02-15 ENCOUNTER — Other Ambulatory Visit: Payer: Self-pay | Admitting: Family Medicine

## 2017-02-15 DIAGNOSIS — I10 Essential (primary) hypertension: Secondary | ICD-10-CM

## 2017-08-04 ENCOUNTER — Emergency Department (HOSPITAL_COMMUNITY)
Admission: EM | Admit: 2017-08-04 | Discharge: 2017-08-04 | Disposition: A | Discharge status: Home / Self Care | Payer: Self-pay | Attending: Emergency Medicine | Admitting: Emergency Medicine

## 2017-08-04 ENCOUNTER — Other Ambulatory Visit: Payer: Self-pay

## 2017-08-04 ENCOUNTER — Encounter (HOSPITAL_COMMUNITY): Payer: Self-pay | Admitting: *Deleted

## 2017-08-04 DIAGNOSIS — S20219A Contusion of unspecified front wall of thorax, initial encounter: Secondary | ICD-10-CM | POA: Insufficient documentation

## 2017-08-04 DIAGNOSIS — Y92410 Unspecified street and highway as the place of occurrence of the external cause: Secondary | ICD-10-CM | POA: Insufficient documentation

## 2017-08-04 DIAGNOSIS — I10 Essential (primary) hypertension: Secondary | ICD-10-CM | POA: Insufficient documentation

## 2017-08-04 DIAGNOSIS — Z79899 Other long term (current) drug therapy: Secondary | ICD-10-CM | POA: Insufficient documentation

## 2017-08-04 DIAGNOSIS — Z7982 Long term (current) use of aspirin: Secondary | ICD-10-CM | POA: Insufficient documentation

## 2017-08-04 DIAGNOSIS — Y939 Activity, unspecified: Secondary | ICD-10-CM | POA: Insufficient documentation

## 2017-08-04 DIAGNOSIS — Y999 Unspecified external cause status: Secondary | ICD-10-CM | POA: Insufficient documentation

## 2017-08-04 DIAGNOSIS — F17220 Nicotine dependence, chewing tobacco, uncomplicated: Secondary | ICD-10-CM | POA: Insufficient documentation
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
## 2017-08-04 NOTE — ED Notes (Signed)
Declined W/C at D/C and was escorted to lobby by RN. 

## 2017-08-05 ENCOUNTER — Other Ambulatory Visit: Payer: Self-pay | Admitting: Internal Medicine

## 2017-08-05 DIAGNOSIS — I10 Essential (primary) hypertension: Secondary | ICD-10-CM

## 2017-08-05 DIAGNOSIS — E781 Pure hyperglyceridemia: Secondary | ICD-10-CM

## 2017-08-08 ENCOUNTER — Other Ambulatory Visit: Payer: Self-pay

## 2017-08-08 ENCOUNTER — Encounter: Payer: Self-pay | Admitting: Family Medicine

## 2017-08-08 ENCOUNTER — Ambulatory Visit: Payer: Self-pay | Attending: Family Medicine | Admitting: Family Medicine

## 2017-08-08 VITALS — BP 164/77 | HR 100 | Temp 98.3°F | Resp 18 | Ht 66.0 in | Wt 228.0 lb

## 2017-08-08 DIAGNOSIS — E669 Obesity, unspecified: Secondary | ICD-10-CM | POA: Insufficient documentation

## 2017-08-08 DIAGNOSIS — Z131 Encounter for screening for diabetes mellitus: Secondary | ICD-10-CM

## 2017-08-08 DIAGNOSIS — Z6830 Body mass index (BMI) 30.0-30.9, adult: Secondary | ICD-10-CM | POA: Insufficient documentation

## 2017-08-08 DIAGNOSIS — Z13220 Encounter for screening for lipoid disorders: Secondary | ICD-10-CM

## 2017-08-08 DIAGNOSIS — E785 Hyperlipidemia, unspecified: Secondary | ICD-10-CM | POA: Insufficient documentation

## 2017-08-08 DIAGNOSIS — K219 Gastro-esophageal reflux disease without esophagitis: Secondary | ICD-10-CM | POA: Insufficient documentation

## 2017-08-08 DIAGNOSIS — I10 Essential (primary) hypertension: Secondary | ICD-10-CM | POA: Insufficient documentation

## 2017-08-08 DIAGNOSIS — I517 Cardiomegaly: Secondary | ICD-10-CM | POA: Insufficient documentation

## 2017-08-08 DIAGNOSIS — R9431 Abnormal electrocardiogram [ECG] [EKG]: Secondary | ICD-10-CM

## 2017-08-08 DIAGNOSIS — I4510 Unspecified right bundle-branch block: Secondary | ICD-10-CM | POA: Insufficient documentation

## 2017-08-08 DIAGNOSIS — Z7982 Long term (current) use of aspirin: Secondary | ICD-10-CM | POA: Insufficient documentation

## 2017-08-08 DIAGNOSIS — Z87898 Personal history of other specified conditions: Secondary | ICD-10-CM | POA: Insufficient documentation

## 2017-08-08 DIAGNOSIS — Z79899 Other long term (current) drug therapy: Secondary | ICD-10-CM | POA: Insufficient documentation

## 2017-08-08 LAB — POCT GLYCOSYLATED HEMOGLOBIN (HGB A1C): HEMOGLOBIN A1C: 5.7

## 2017-08-08 MED ORDER — LOSARTAN POTASSIUM 50 MG PO TABS: 50.0000 mg | ORAL_TABLET | Freq: Every day | ORAL | 2 refills | Status: DC

## 2017-08-08 MED ORDER — AMLODIPINE BESYLATE 10 MG PO TABS: ORAL_TABLET | ORAL | 2 refills | Status: DC

## 2017-08-08 MED FILL — LOSARTAN POTASSIUM 50 MG TA: 50 | 30 days supply | Qty: 30 | Fill #0

## 2017-08-08 MED FILL — AMLODIPINE BESYLATE 10 MG T: 10 | 30 days supply | Qty: 30 | Fill #0

## 2017-08-08 NOTE — Progress Notes (Signed)
5'7 

## 2017-08-08 NOTE — Patient Instructions (Signed)
Managing Your Hypertension Hypertension is commonly called high blood pressure. This is when the force of your blood pressing against the walls of your arteries is too strong. Arteries are blood vessels that carry blood from your heart throughout your body. Hypertension forces the heart to work harder to pump blood, and may cause the arteries to become narrow or stiff. Having untreated or uncontrolled hypertension can cause heart attack, stroke, kidney disease, and other problems. What are blood pressure readings? A blood pressure reading consists of a higher number over a lower number. Ideally, your blood pressure should be below 120/80. The first ("top") number is called the systolic pressure. It is a measure of the pressure in your arteries as your heart beats. The second ("bottom") number is called the diastolic pressure. It is a measure of the pressure in your arteries as the heart relaxes. What does my blood pressure reading mean? Blood pressure is classified into four stages. Based on your blood pressure reading, your health care provider may use the following stages to determine what type of treatment you need, if any. Systolic pressure and diastolic pressure are measured in a unit called mm Hg. Normal  Systolic pressure: below 120.  Diastolic pressure: below 80. Elevated  Systolic pressure: 120-129.  Diastolic pressure: below 80. Hypertension stage 1  Systolic pressure: 130-139.  Diastolic pressure: 80-89. Hypertension stage 2  Systolic pressure: 140 or above.  Diastolic pressure: 90 or above. What health risks are associated with hypertension? Managing your hypertension is an important responsibility. Uncontrolled hypertension can lead to:  A heart attack.  A stroke.  A weakened blood vessel (aneurysm).  Heart failure.  Kidney damage.  Eye damage.  Metabolic syndrome.  Memory and concentration problems.  What changes can I make to manage my  hypertension? Hypertension can be managed by making lifestyle changes and possibly by taking medicines. Your health care provider will help you make a plan to bring your blood pressure within a normal range. Eating and drinking  Eat a diet that is high in fiber and potassium, and low in salt (sodium), added sugar, and fat. An example eating plan is called the DASH (Dietary Approaches to Stop Hypertension) diet. To eat this way: ? Eat plenty of fresh fruits and vegetables. Try to fill half of your plate at each meal with fruits and vegetables. ? Eat whole grains, such as whole wheat pasta, brown rice, or whole grain bread. Fill about one quarter of your plate with whole grains. ? Eat low-fat diary products. ? Avoid fatty cuts of meat, processed or cured meats, and poultry with skin. Fill about one quarter of your plate with lean proteins such as fish, chicken without skin, beans, eggs, and tofu. ? Avoid premade and processed foods. These tend to be higher in sodium, added sugar, and fat.  Reduce your daily sodium intake. Most people with hypertension should eat less than 1,500 mg of sodium a day.  Limit alcohol intake to no more than 1 drink a day for nonpregnant women and 2 drinks a day for men. One drink equals 12 oz of beer, 5 oz of wine, or 1 oz of hard liquor. Lifestyle  Work with your health care provider to maintain a healthy body weight, or to lose weight. Ask what an ideal weight is for you.  Get at least 30 minutes of exercise that causes your heart to beat faster (aerobic exercise) most days of the week. Activities may include walking, swimming, or biking.  Include exercise   to strengthen your muscles (resistance exercise), such as weight lifting, as part of your weekly exercise routine. Try to do these types of exercises for 30 minutes at least 3 days a week.  Do not use any products that contain nicotine or tobacco, such as cigarettes and e-cigarettes. If you need help quitting, ask  your health care provider.  Control any long-term (chronic) conditions you have, such as high cholesterol or diabetes. Monitoring  Monitor your blood pressure at home as told by your health care provider. Your personal target blood pressure may vary depending on your medical conditions, your age, and other factors.  Have your blood pressure checked regularly, as often as told by your health care provider. Working with your health care provider  Review all the medicines you take with your health care provider because there may be side effects or interactions.  Talk with your health care provider about your diet, exercise habits, and other lifestyle factors that may be contributing to hypertension.  Visit your health care provider regularly. Your health care provider can help you create and adjust your plan for managing hypertension. Will I need medicine to control my blood pressure? Your health care provider may prescribe medicine if lifestyle changes are not enough to get your blood pressure under control, and if:  Your systolic blood pressure is 130 or higher.  Your diastolic blood pressure is 80 or higher.  Take medicines only as told by your health care provider. Follow the directions carefully. Blood pressure medicines must be taken as prescribed. The medicine does not work as well when you skip doses. Skipping doses also puts you at risk for problems. Contact a health care provider if:  You think you are having a reaction to medicines you have taken.  You have repeated (recurrent) headaches.  You feel dizzy.  You have swelling in your ankles.  You have trouble with your vision. Get help right away if:  You develop a severe headache or confusion.  You have unusual weakness or numbness, or you feel faint.  You have severe pain in your chest or abdomen.  You vomit repeatedly.  You have trouble breathing. Summary  Hypertension is when the force of blood pumping through  your arteries is too strong. If this condition is not controlled, it may put you at risk for serious complications.  Your personal target blood pressure may vary depending on your medical conditions, your age, and other factors. For most people, a normal blood pressure is less than 120/80.  Hypertension is managed by lifestyle changes, medicines, or both. Lifestyle changes include weight loss, eating a healthy, low-sodium diet, exercising more, and limiting alcohol. This information is not intended to replace advice given to you by your health care provider. Make sure you discuss any questions you have with your health care provider. Document Released: 04/02/2012 Document Revised: 06/06/2016 Document Reviewed: 06/06/2016 Elsevier Interactive Patient Education  2018 [employer] Inc.  

## 2017-08-08 NOTE — Progress Notes (Signed)
Subjective:  Patient ID: Victor [name], male    DOB: 1971-02-10  Age: 47 y.o. MRN: [id number]  CC: Hypertension and Hyperlipidemia   HPI Victor [name] comes for medication refills. History of HTN and GERD.He is not exercising and is not adherent to low salt diet.  He does not check BP at home. He has been without his HTN for a few weeks. Cardiac symptoms chest pain and tightness in the past. He reports experiencing pain with and without eating. Pain worse after eating fried meals. He denies any N/V. History of GERD, he report not taking zantac. Patient denies claudication, dyspnea, lower extremity edema, near-syncope, palpitations and syncope.  Cardiovascular risk factors: dyslipidemia, hypertension, male gender and obesity (BMI >= 30 kg/m2).He is not a smoker. Use of agents associated with hypertension: none. History of target organ damage: none.     Outpatient Medications Prior to Visit  Medication Sig Dispense Refill  . aspirin 81 MG EC tablet Take 1 tablet (81 mg total) by mouth daily. 30 tablet 4  . amLODipine (NORVASC) 10 MG tablet TAKE 1 TABLET BY MOUTH DAILY. 30 tablet 0  . gemfibrozil (LOPID) 600 MG tablet Take 1 tablet (600 mg total) by mouth 2 (two) times daily before a meal. 60 tablet 0  . traMADol (ULTRAM) 50 MG tablet Take 1 tablet (50 mg total) by mouth every 8 (eight) hours as needed. (Patient not taking: Reported on 07/24/2016) 60 tablet 0   No facility-administered medications prior to visit.    Review of Systems  Constitutional: Negative.   Respiratory: Negative.   Cardiovascular: Negative.  Chest pain: history of chest pain.   BP (!) 164/77 (BP Location: Left Arm, Patient Position: Sitting, Cuff Size: Large)   Pulse 100   Temp 98.3 F (36.8 C) (Oral)   Resp 18   Ht 5\' 6"  (1.676 m)   Wt 228 lb (103.4 kg)   SpO2 95%   BMI 36.80 kg/m    Physical Exam  Nursing note and vitals reviewed. Constitutional: He appears well-developed and well-nourished.  Neck: Normal range  of motion. Neck supple. No JVD present.  Cardiovascular: Normal rate, regular rhythm, normal heart sounds and intact distal pulses.  Respiratory: Effort normal and breath sounds normal.  GI: Soft. Bowel sounds are normal. There is no tenderness.  Skin: Skin is warm and dry.  Psychiatric: He has a normal mood and affect.    Assessment & Plan:   1. Essential hypertension Schedule BP recheck in 2 weeks with nurse. If BP is greater than 90/60 (MAP 65 or greater) but not less than 130/80 may increase dose of losartan to 100 mg and recheck in another 2 weeks.  Follow up with PCP in 3 months.  - amLODipine (NORVASC) 10 MG tablet; TAKE 1 TABLET BY MOUTH DAILY.  Dispense: 30 tablet; Refill: 2 - losartan (COZAAR) 50 MG tablet; Take 1 tablet (50 mg total) by mouth daily.  Dispense: 30 tablet; Refill: 2 - CBC - CMP and Liver  2. History of chest pain Due to  cardiac risk factors EKG obtained. Patient has history of GERD offered to refill zantac patient declines and reports he will get OTC.  - EKG 12-Lead - Ambulatory referral to Cardiology - CBC - CMP and Liver  3. Screening for diabetes mellitus Last hgba1c 6.5 in 2016. Today in office hgba1c 5.7.  Dietary and exercise interventions discussed. - POCT glycosylated hemoglobin (Hb A1C)  4. Screening cholesterol level  - Lipid Panel; Future  5. Abnormal  ECG  - Ambulatory referral to Cardiology  6. LVH (left ventricular hypertrophy)  - Ambulatory referral to Cardiology  Follow up: Return in about 2 weeks (around 08/22/2017) for BP check with [facility].   Victor BarkMandesia R Leelynn Whetsel FNP

## 2017-08-09 LAB — CMP AND LIVER
ALBUMIN: 4.4 g/dL (ref 3.5–5.5)
ALK PHOS: 65 IU/L (ref 39–117)
ALT: 50 IU/L — ABNORMAL HIGH (ref 0–44)
AST: 41 IU/L — ABNORMAL HIGH (ref 0–40)
BILIRUBIN TOTAL: 0.4 mg/dL (ref 0.0–1.2)
BILIRUBIN, DIRECT: 0.16 mg/dL (ref 0.00–0.40)
BUN: 12 mg/dL (ref 6–24)
CALCIUM: 9.8 mg/dL (ref 8.7–10.2)
CO2: 21 mmol/L (ref 20–29)
Chloride: 104 mmol/L (ref 96–106)
Creatinine, Ser: 0.88 mg/dL (ref 0.76–1.27)
GFR calc non Af Amer: 102 mL/min/{1.73_m2} (ref >59)
GFR, EST AFRICAN AMERICAN: 117 mL/min/{1.73_m2} (ref >59)
Glucose: 111 mg/dL — ABNORMAL HIGH (ref 65–99)
POTASSIUM: 4 mmol/L (ref 3.5–5.2)
SODIUM: 142 mmol/L (ref 134–144)
TOTAL PROTEIN: 7.6 g/dL (ref 6.0–8.5)

## 2017-08-09 LAB — CBC
HEMATOCRIT: 40.7 % (ref 37.5–51.0)
HEMOGLOBIN: 13.4 g/dL (ref 13.0–17.7)
MCH: 29.8 pg (ref 26.6–33.0)
MCHC: 32.9 g/dL (ref 31.5–35.7)
MCV: 90 fL (ref 79–97)
Platelets: 190 10*3/uL (ref 150–379)
RBC: 4.5 x10E6/uL (ref 4.14–5.80)
RDW: 14.1 % (ref 12.3–15.4)
WBC: 7 10*3/uL (ref 3.4–10.8)

## 2017-08-19 ENCOUNTER — Telehealth (INDEPENDENT_AMBULATORY_CARE_PROVIDER_SITE_OTHER): Payer: Self-pay | Admitting: *Deleted

## 2017-08-19 NOTE — Telephone Encounter (Signed)
-----   Message from Lizbeth BarkMandesia R Hairston, FNP sent at 08/14/2017 11:09 AM EST ----- Labs normal. No anemia present. Liver function test slightly elevated. Begin eating a diet lower in fat. Decrease your intake of fried foods and whole milk. Encourage weight loss.

## 2017-08-19 NOTE — Telephone Encounter (Signed)
Patient verified DOB Patients contact number disconnected. Please inform patient of labs being normal and no anemia being present. Patient's liver is slightly elevated. He should begin decreasing his fat intake and increase physical activity.

## 2017-08-22 ENCOUNTER — Encounter: Payer: Self-pay | Admitting: Pharmacist

## 2017-09-19 ENCOUNTER — Other Ambulatory Visit: Payer: Self-pay | Admitting: Internal Medicine

## 2017-09-19 DIAGNOSIS — E781 Pure hyperglyceridemia: Secondary | ICD-10-CM

## 2017-09-24 NOTE — Progress Notes (Deleted)
Cardiology Office Note    Date:  09/24/2017   ID:  Victor [name], DOB 03/25/1971, MRN [id number]  PCP:  Lizbeth BarkHairston, Mandesia R, FNP  Cardiologist: Lesleigh NoeHenry W Smith III, MD   No chief complaint on file.   History of Present Illness:  Victor [name] is a 47 y.o. male ***    Past Medical History:  Diagnosis Date  . Dizzy 10/15/2013    "SWIMMY HEAD    "  . Hypertension   . Hypertriglyceridemia   . Obesity     No past surgical history on file.  Current Medications: Outpatient Medications Prior to Visit  Medication Sig Dispense Refill  . amLODipine (NORVASC) 10 MG tablet TAKE 1 TABLET BY MOUTH DAILY. 30 tablet 2  . aspirin 81 MG EC tablet Take 1 tablet (81 mg total) by mouth daily. 30 tablet 4  . gemfibrozil (LOPID) 600 MG tablet TAKE 1 TABLET BY MOUTH 2 TIMES DAILY BEFORE A MEAL. 60 tablet 0  . losartan (COZAAR) 50 MG tablet Take 1 tablet (50 mg total) by mouth daily. 30 tablet 2  . ranitidine (ZANTAC) 150 MG tablet TAKE 1 TABLET BY MOUTH 2 TIMES DAILY. 60 tablet 1   No facility-administered medications prior to visit.      Allergies:   Patient has no known allergies.   Social History   Socioeconomic History  . Marital status: Single    Spouse name: Not on file  . Number of children: Not on file  . Years of education: Not on file  . Highest education level: Not on file  Social Needs  . Financial resource strain: Not on file  . Food insecurity - worry: Not on file  . Food insecurity - inability: Not on file  . Transportation needs - medical: Not on file  . Transportation needs - non-medical: Not on file  Occupational History  . Not on file  Tobacco Use  . Smoking status: Never Smoker  . Smokeless tobacco: Current User    Types: Chew  Substance and Sexual Activity  . Alcohol use: Yes    Comment: 4 12 oz beers twice/wk.  . Drug use: No  . Sexual activity: Not on file  Other Topics Concern  . Not on file  Social History Narrative   [place]  Does yard work for a  living.  Says he's active @ home.     Family History:  The patient's ***family history includes Aneurysm in his mother; CAD in his mother; Other in his father.   ROS:   Please see the history of present illness.    ***  All other systems reviewed and are negative.   PHYSICAL EXAM:   VS:  There were no vitals taken for this visit.   GEN: Well nourished, well developed, in no acute distress  HEENT: normal  Neck: no JVD, carotid bruits, or masses Cardiac: ***RRR; no murmurs, rubs, or gallops,no edema  Respiratory:  clear to auscultation bilaterally, normal work of breathing GI: soft, nontender, nondistended, + BS MS: no deformity or atrophy  Skin: warm and dry, no rash Neuro:  Alert and Oriented x 3, Strength and sensation are intact Psych: euthymic mood, full affect  Wt Readings from Last 3 Encounters:  08/08/17 228 lb (103.4 kg)  08/04/17 230 lb (104.3 kg)  07/24/16 234 lb (106.1 kg)      Studies/Labs Reviewed:   EKG:  EKG  ***  Recent Labs: 08/08/2017: ALT 50; BUN 12; Creatinine, Ser 0.88; Hemoglobin  13.4; Platelets 190; Potassium 4.0; Sodium 142   Lipid Panel    Component Value Date/Time   CHOL 113 07/30/2016 0837   TRIG 443 (H) 07/30/2016 0837   HDL 35 (L) 07/30/2016 0837   CHOLHDL 3.2 07/30/2016 0837   VLDL NOT CALC 07/30/2016 0837   LDLCALC NOT CALC 07/30/2016 0837    Additional studies/ records that were reviewed today include:  ***    ASSESSMENT:    1. Abnormal EKG   2. Hypertriglyceridemia   3. Dyspnea, unspecified type   4. Essential hypertension      PLAN:  In order of problems listed above:  1. ***    Medication Adjustments/Labs and Tests Ordered: Current medicines are reviewed at length with the patient today.  Concerns regarding medicines are outlined above.  Medication changes, Labs and Tests ordered today are listed in the Patient Instructions below. There are no Patient Instructions on file for this visit.   Signed, Lesleigh Noe, MD  09/24/2017 1:38 PM    [facility] [address] [place], [place], [place]  [address] Phone: [phone]; Fax: [phone]

## 2017-09-25 ENCOUNTER — Ambulatory Visit: Payer: Self-pay | Admitting: Interventional Cardiology

## 2017-10-07 ENCOUNTER — Encounter: Payer: Self-pay | Admitting: Interventional Cardiology

## 2018-01-01 MED FILL — AMLODIPINE BESYLATE 10 MG T: 10 | 30 days supply | Qty: 30 | Fill #1

## 2018-10-01 ENCOUNTER — Other Ambulatory Visit: Payer: Self-pay

## 2018-10-01 ENCOUNTER — Encounter (HOSPITAL_COMMUNITY): Payer: Self-pay | Admitting: Emergency Medicine

## 2018-10-01 ENCOUNTER — Emergency Department (HOSPITAL_COMMUNITY)
Admission: EM | Admit: 2018-10-01 | Discharge: 2018-10-01 | Disposition: A | Discharge status: Home / Self Care | Payer: Self-pay | Attending: Emergency Medicine | Admitting: Emergency Medicine

## 2018-10-01 ENCOUNTER — Emergency Department (HOSPITAL_COMMUNITY): Payer: Self-pay

## 2018-10-01 DIAGNOSIS — I10 Essential (primary) hypertension: Secondary | ICD-10-CM | POA: Insufficient documentation

## 2018-10-01 DIAGNOSIS — Z7982 Long term (current) use of aspirin: Secondary | ICD-10-CM | POA: Insufficient documentation

## 2018-10-01 DIAGNOSIS — H538 Other visual disturbances: Secondary | ICD-10-CM | POA: Insufficient documentation

## 2018-10-01 DIAGNOSIS — R0789 Other chest pain: Secondary | ICD-10-CM | POA: Insufficient documentation

## 2018-10-01 DIAGNOSIS — Z79899 Other long term (current) drug therapy: Secondary | ICD-10-CM | POA: Insufficient documentation

## 2018-10-01 LAB — CBC
HCT: 38.7 % — ABNORMAL LOW (ref 39.0–52.0)
Hemoglobin: 12.9 g/dL — ABNORMAL LOW (ref 13.0–17.0)
MCH: 33.9 pg (ref 26.0–34.0)
MCHC: 33.3 g/dL (ref 30.0–36.0)
MCV: 101.8 fL — ABNORMAL HIGH (ref 80.0–100.0)
NRBC: 0 % (ref 0.0–0.2)
PLATELETS: 99 10*3/uL — AB (ref 150–400)
RBC: 3.8 MIL/uL — ABNORMAL LOW (ref 4.22–5.81)
RDW: 13.3 % (ref 11.5–15.5)
WBC: 4.4 10*3/uL (ref 4.0–10.5)

## 2018-10-01 LAB — BASIC METABOLIC PANEL
ANION GAP: 7 (ref 5–15)
BUN: 9 mg/dL (ref 6–20)
CALCIUM: 8.9 mg/dL (ref 8.9–10.3)
CO2: 23 mmol/L (ref 22–32)
Chloride: 108 mmol/L (ref 98–111)
Creatinine, Ser: 0.83 mg/dL (ref 0.61–1.24)
GFR calc Af Amer: >60 mL/min (ref >60)
GFR calc non Af Amer: >60 mL/min (ref >60)
Glucose, Bld: 107 mg/dL — ABNORMAL HIGH (ref 70–99)
Potassium: 4.1 mmol/L (ref 3.5–5.1)
Sodium: 138 mmol/L (ref 135–145)

## 2018-10-01 LAB — I-STAT TROPONIN, ED
Troponin i, poc: 0.06 ng/mL (ref 0.00–0.08)
Troponin i, poc: 0.07 ng/mL (ref 0.00–0.08)

## 2018-10-01 MED ORDER — ALUM & MAG HYDROXIDE-SIMETH 200-200-20 MG/5ML PO SUSP
30.0000 mL | Freq: Once | ORAL | Status: AC
  Administered 2018-10-01: 30 mL via ORAL
  Filled 2018-10-01: qty 30

## 2018-10-01 MED ORDER — OMEPRAZOLE 20 MG PO CPDR: 20.0000 mg | DELAYED_RELEASE_CAPSULE | Freq: Every day | ORAL | 0 refills | Status: DC

## 2018-10-01 NOTE — ED Triage Notes (Signed)
Pt in from home with c/o blurred vision x 1 wk and tight central cp since MN last night. States the pain radiates to L arm. Denies sob, n/v, cough or fever. Has HTN, has not taken any BP meds x 1 wk

## 2018-10-01 NOTE — ED Provider Notes (Signed)
[facility] EMERGENCY DEPARTMENT Provider Note   CSN: [id number] Arrival date & time: 10/01/18  1045    History   Chief Complaint Chief Complaint  Patient presents with  . Chest Pain  . Blurred Vision    HPI Victor [name] is a 48 y.o. male.     The history is provided by the patient and medical records. No language interpreter was used.  Chest Pain     48 year old male with history of hypertension, hypertriglyceridemia, and obesity presenting for evaluation of chest pain and blurry vision.  Patient report around midnight last night he was awoke with pain to his epigastric midsternal region.  He described pain as a sharp burning sensation radiates to his shoulder with tingling sensation to both of his hands and fingers.  Pain initially was 8 out of 10 but has steadily improved and now rates his pain is 2 out of 10 without any specific treatment.  He did complaining of some mild lightheadedness and blurred vision during this episode.  He did not complain of any fever or chills, no nausea, shortness of breath, or diaphoresis.  Furthermore, patient endorsed having progressive worsening blurry vision to both eyes ongoing for more than a month.  He mention having more difficulty seeing things up close as opposed to far.  He denies any diplopia or loss of vision.  He does not wear any glasses or contact lens and does not have an ophthalmologist.  Patient denies tobacco use but does endorse use of alcohol.  He has been compliant with his medication due to lack of funds.  He does take an aspirin every other day.  He denies any other significant NSAID use.  He denies hematemesis, hematochezia or melena.  Past Medical History:  Diagnosis Date  . Dizzy 10/15/2013    "SWIMMY HEAD    "  . Hypertension   . Hypertriglyceridemia   . Obesity     Patient Active Problem List   Diagnosis Date Noted  . HTN (hypertension) 12/06/2014  . Dyspnea 10/16/2013  . Hypertriglyceridemia   .  Dizziness 10/15/2013  . Weakness 10/15/2013  . Abnormal EKG 10/15/2013    No past surgical history on file.      Home Medications    Prior to Admission medications   Medication Sig Start Date End Date Taking? Authorizing Provider  amLODipine (NORVASC) 10 MG tablet TAKE 1 TABLET BY MOUTH DAILY. 08/08/17   Lizbeth Bark, FNP  aspirin 81 MG EC tablet Take 1 tablet (81 mg total) by mouth daily. 05/06/14   Ambrose Finland, NP  gemfibrozil (LOPID) 600 MG tablet TAKE 1 TABLET BY MOUTH 2 TIMES DAILY BEFORE A MEAL. 09/19/17   Quentin Angst, MD  losartan (COZAAR) 50 MG tablet Take 1 tablet (50 mg total) by mouth daily. 08/08/17   Lizbeth Bark, FNP  ranitidine (ZANTAC) 150 MG tablet TAKE 1 TABLET BY MOUTH 2 TIMES DAILY. 01/09/17   Lizbeth Bark, FNP    Family History Family History  Problem Relation Age of Onset  . CAD Mother        stenting @ 3; alive @ 55  . Aneurysm Mother        thoracic s/p surgery @ 36  . Other Father        unknown    Social History Social History   Tobacco Use  . Smoking status: Never Smoker  . Smokeless tobacco: Current User    Types: Chew  Substance Use  Topics  . Alcohol use: Yes    Comment: 4 12 oz beers twice/wk.  . Drug use: No     Allergies   Patient has no known allergies.   Review of Systems Review of Systems  Cardiovascular: Positive for chest pain.  All other systems reviewed and are negative.    Physical Exam Updated Vital Signs BP (!) 157/94   Pulse 95   Temp 98.3 F (36.8 C) (Oral)   Resp (!) 21   Wt 93 kg   SpO2 97%   BMI 33.09 kg/m   Physical Exam Vitals signs and nursing note reviewed.  Constitutional:      General: He is not in acute distress.    Appearance: He is well-developed.  HENT:     Head: Atraumatic.  Eyes:     General: Lids are normal. Vision grossly intact.        Right eye: No foreign body.        Left eye: No foreign body.     Extraocular Movements: Extraocular  movements intact.     Conjunctiva/sclera: Conjunctivae normal.  Neck:     Musculoskeletal: Neck supple.  Cardiovascular:     Rate and Rhythm: Normal rate and regular rhythm.     Heart sounds: Normal heart sounds.  Pulmonary:     Effort: Pulmonary effort is normal.     Breath sounds: Normal breath sounds.  Chest:     Chest wall: No tenderness.  Abdominal:     Palpations: Abdomen is soft.     Tenderness: There is no abdominal tenderness.  Musculoskeletal:     Right lower leg: No edema.     Left lower leg: No edema.  Skin:    Capillary Refill: Capillary refill takes less than 2 seconds.     Findings: No rash.  Neurological:     Mental Status: He is alert and oriented to person, place, and time.      ED Treatments / Results  Labs (all labs ordered are listed, but only abnormal results are displayed) Labs Reviewed  BASIC METABOLIC PANEL - Abnormal; Notable for the following components:      Result Value   Glucose, Bld 107 (*)    All other components within normal limits  CBC - Abnormal; Notable for the following components:   RBC 3.80 (*)    Hemoglobin 12.9 (*)    HCT 38.7 (*)    MCV 101.8 (*)    Platelets 99 (*)    All other components within normal limits  I-STAT TROPONIN, ED  I-STAT TROPONIN, ED    EKG EKG Interpretation  Date/Time:  Wednesday October 01 2018 11:01:58 EDT Ventricular Rate:  97 PR Interval:  154 QRS Duration: 142 QT Interval:  434 QTC Calculation: 551 R Axis:   -73 Text Interpretation:  Normal sinus rhythm Right bundle branch block Left anterior fascicular block Bifascicular block  Left ventricular hypertrophy Inferior infarct , age undetermined T wave abnormality, consider lateral ischemia Abnormal ECG Confirmed by Geoffery Lyons ([id number]) on 10/01/2018 11:08:55 AM   Radiology No results found.  Procedures Procedures (including critical care time)  Medications Ordered in ED Medications  alum & mag hydroxide-simeth (MAALOX/MYLANTA) 200-200-20  MG/5ML suspension 30 mL (30 mLs Oral Given 10/01/18 1253)     Initial Impression / Assessment and Plan / ED Course  I have reviewed the triage vital signs and the nursing notes.  Pertinent labs & imaging results that were available during my care of the patient were  reviewed by me and considered in my medical decision making (see chart for details).        BP (!) 147/86   Pulse 89   Temp 98.3 F (36.8 C) (Oral)   Resp 20   Wt 93 kg   SpO2 100%   BMI 33.09 kg/m    Final Clinical Impressions(s) / ED Diagnoses   Final diagnoses:  Atypical chest pain  Blurred vision    ED Discharge Orders    None     12:02 PM Patient here with chest pain that woke him up this morning.  Pain is atypical of ACS.  Suspect GI related pain likely gastritis.  GI cocktail given.  He endorsed blurry vision bilaterally but this is an ongoing issue for more than a month.  Doubt acute emergent ocular emergency.  Will perform visual screening test.  Suspect CP is gastritis, pt given prilosec as treatment.  GI referral given.  GI cocktail did help resolved his pain.  As for blurry vision, encourage pt to f/u with ophthalmology for further evaluation.    Fayrene Helper, PA-C 10/03/18 1607    Geoffery Lyons, MD 10/08/18 2325

## 2018-10-01 NOTE — ED Notes (Signed)
Patient transported to X-ray 

## 2018-10-01 NOTE — Discharge Instructions (Addendum)
Please call and follow up with your doctor for further evaluation of your chest pain.  Your pain could be due to heart burn.  Take Prilosec as prescribed.  Follow up with your eye specialist for further evaluation of your blurry vision. Return if you have any concerns.

## 2018-10-01 NOTE — ED Notes (Signed)
Discharge instructions (including medications) discussed with and copy provided to patient/caregiver.  Pt stable, ambulatory, and verbalizes understanding of d/c instructions.  Pt given food and a bus pass.

## 2018-12-09 ENCOUNTER — Ambulatory Visit: Payer: Self-pay | Attending: Nurse Practitioner | Admitting: Nurse Practitioner

## 2018-12-09 ENCOUNTER — Other Ambulatory Visit: Payer: Self-pay

## 2018-12-09 ENCOUNTER — Encounter: Payer: Self-pay | Admitting: Nurse Practitioner

## 2018-12-09 DIAGNOSIS — E781 Pure hyperglyceridemia: Secondary | ICD-10-CM

## 2018-12-09 DIAGNOSIS — I10 Essential (primary) hypertension: Secondary | ICD-10-CM

## 2018-12-09 DIAGNOSIS — K219 Gastro-esophageal reflux disease without esophagitis: Secondary | ICD-10-CM

## 2018-12-09 MED ORDER — GEMFIBROZIL 600 MG PO TABS: ORAL_TABLET | ORAL | 3 refills | Status: DC

## 2018-12-09 MED ORDER — AMLODIPINE BESYLATE 10 MG PO TABS: ORAL_TABLET | ORAL | 2 refills | Status: DC

## 2018-12-09 MED ORDER — OMEPRAZOLE 20 MG PO CPDR: 20.0000 mg | DELAYED_RELEASE_CAPSULE | Freq: Every day | ORAL | 0 refills | Status: DC

## 2018-12-09 MED ORDER — LOSARTAN POTASSIUM 50 MG PO TABS: 50.0000 mg | ORAL_TABLET | Freq: Every day | ORAL | 2 refills | Status: DC

## 2018-12-09 MED FILL — ?AMLODIPINE BESYLATE 10 MG: 10 | 30 days supply | Qty: 30 | Fill #0

## 2018-12-09 MED FILL — ?OMEPRAZOLE 20 MG CAPSULE D: 20 | 30 days supply | Qty: 30 | Fill #0

## 2018-12-09 MED FILL — LOSARTAN POTASSIUM 50 MG TA: 50 | 30 days supply | Qty: 30 | Fill #0

## 2018-12-09 NOTE — Progress Notes (Signed)
Virtual Visit via Telephone Note Due to national recommendations of social distancing due to [place] 19, telehealth visit is felt to be most appropriate for this patient at this time.  I discussed the limitations, risks, security and privacy concerns of performing an evaluation and management service by telephone and the availability of in person appointments. I also discussed with the patient that there may be a patient responsible charge related to this service. The patient expressed understanding and agreed to proceed.    I connected with Victor [name] on 12/09/18  at   3:30 PM EDT  EDT by telephone and verified that I am speaking with the correct person using two identifiers.   Consent I discussed the limitations, risks, security and privacy concerns of performing an evaluation and management service by telephone and the availability of in person appointments. I also discussed with the patient that there may be a patient responsible charge related to this service. The patient expressed understanding and agreed to proceed.   Location of Patient: Private  Residence   Location of Provider: [place] [facility] [employer] Office    Persons participating in Telemedicine visit: [name] FNP-BC [place]    History of Present Illness: Telemedicine visit for: Re establish care  He has a history of HTN, HPL and GERD. Currently only taking amlodipine '10mg'$  daily.  He was seen in the ED on 10/01/2018 with complaints of chest pain.  As chest pain was suspicious for GERD patient was given GI cocktail with complete relief of symptoms.  Recommended he follow-up with GI and was given Prilosec 20 mg upon discharge.   Essential Hypertension He does not monitor his blood pressure at home. Denies chest pain, shortness of breath, palpitations, lightheadedness, dizziness, headaches or BLE edema.  He was being prescribed amlodipine 10 mg and losartan 50 mg today. BP Readings from Last 3  Encounters:  10/01/18 (!) 147/86  08/08/17 (!) 164/77  08/04/17 (!) 144/80   Mixed Hyperlipidemia Taking gemfibrozil 600 mg BID. Denies any statin intolerance or myalgias.  Lab Results  Component Value Date   CHOL 113 07/30/2016   HDL 35 (L) 07/30/2016   LDLCALC NOT CALC 07/30/2016   TRIG 443 (H) 07/30/2016   CHOLHDL 3.2 07/30/2016   Past Medical History:  Diagnosis Date  . Dizzy 10/15/2013    "SWIMMY HEAD    "  . Hypertension   . Hypertriglyceridemia   . Obesity     Past Surgical History:  Procedure Laterality Date  . NO PAST SURGERIES      Family History  Problem Relation Age of Onset  . CAD Mother        stenting @ 64; alive @ 66  . Aneurysm Mother        thoracic s/p surgery @ 28  . Lung disease Mother   . Other Father        unknown    Social History   Socioeconomic History  . Marital status: Single    Spouse name: Not on file  . Number of children: Not on file  . Years of education: Not on file  . Highest education level: Not on file  Occupational History  . Not on file  Social Needs  . Financial resource strain: Not on file  . Food insecurity:    Worry: Not on file    Inability: Not on file  . Transportation needs:    Medical: Not on file    Non-medical: Not on file  Tobacco Use  . Smoking status: Never Smoker  . Smokeless tobacco: Current User    Types: Chew  Substance and Sexual Activity  . Alcohol use: Yes    Comment: 4 12 oz beers twice/wk.  . Drug use: No  . Sexual activity: Yes  Lifestyle  . Physical activity:    Days per week: Not on file    Minutes per session: Not on file  . Stress: Not on file  Relationships  . Social connections:    Talks on phone: Not on file    Gets together: Not on file    Attends religious service: Not on file    Active member of club or organization: Not on file    Attends meetings of clubs or organizations: Not on file    Relationship status: Not on file  Other Topics Concern  . Not on file  Social  History Narrative   Lives in [place].  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     Observations/Objective: Awake, alert and oriented x 3   Review of Systems  Constitutional: Negative for fever, malaise/fatigue and weight loss.  HENT: Negative.  Negative for nosebleeds.   Eyes: Positive for blurred vision. Negative for double vision and photophobia.  Respiratory: Negative.  Negative for cough and shortness of breath.   Cardiovascular: Negative.  Negative for chest pain, palpitations and leg swelling.  Gastrointestinal: Negative.  Negative for heartburn, nausea and vomiting.  Musculoskeletal: Negative.  Negative for myalgias.  Neurological: Negative.  Negative for dizziness, focal weakness, seizures and headaches.  Psychiatric/Behavioral: Negative.  Negative for suicidal ideas.    Assessment and Plan: Diagnoses and all orders for this visit:  Essential hypertension -     amLODipine (NORVASC) 10 MG tablet; TAKE 1 TABLET BY MOUTH DAILY. -     losartan (COZAAR) 50 MG tablet; Take 1 tablet (50 mg total) by mouth daily. -     CBC -     CMP14+EGFR Continue all antihypertensives as prescribed.  Remember to bring in your blood pressure log with you for your follow up appointment.  DASH/Mediterranean Diets are healthier choices for HTN.    Hypertriglyceridemia -     gemfibrozil (LOPID) 600 MG tablet; TAKE 1 TABLET BY MOUTH 2 TIMES DAILY BEFORE A MEAL. -     Lipid panel INSTRUCTIONS: Work on a low fat, heart healthy diet and participate in regular aerobic exercise program by working out at least 150 minutes per week; 5 days a week-30 minutes per day. Avoid red meat, fried foods. junk foods, sodas, sugary drinks, unhealthy snacking, alcohol and smoking.  Drink at least 48oz of water per day and monitor your carbohydrate intake daily.    Gastroesophageal reflux disease, esophagitis presence not specified -     omeprazole (PRILOSEC) 20 MG capsule; Take 1 capsule (20 mg total) by  mouth daily. INSTRUCTIONS: Avoid GERD Triggers: acidic, spicy or fried foods, caffeine, coffee, sodas,  alcohol and chocolate.      Follow Up Instructions Return in about 2 months (around 02/08/2019).     I discussed the assessment and treatment plan with the patient. The patient was provided an opportunity to ask questions and all were answered. The patient agreed with the plan and demonstrated an understanding of the instructions.   The patient was advised to call back or seek an in-person evaluation if the symptoms worsen or if the condition fails to improve as anticipated.  I provided 21 minutes of  non-face-to-face time during this encounter including median intraservice time, reviewing previous notes, labs, imaging, medications and explaining diagnosis and management.  [name], FNP-BC

## 2018-12-11 ENCOUNTER — Ambulatory Visit: Payer: Self-pay | Attending: Nurse Practitioner

## 2018-12-12 LAB — LIPID PANEL
Chol/HDL Ratio: 6.7 ratio — ABNORMAL HIGH (ref 0.0–5.0)
Cholesterol, Total: 222 mg/dL — ABNORMAL HIGH (ref 100–199)
HDL: 33 mg/dL — ABNORMAL LOW
Triglycerides: 825 mg/dL (ref 0–149)

## 2018-12-12 LAB — CMP14+EGFR
ALT: 74 IU/L — ABNORMAL HIGH (ref 0–44)
AST: 82 IU/L — ABNORMAL HIGH (ref 0–40)
Albumin/Globulin Ratio: 1.3 (ref 1.2–2.2)
Albumin: 3.9 g/dL — ABNORMAL LOW (ref 4.0–5.0)
Alkaline Phosphatase: 79 IU/L (ref 39–117)
BUN/Creatinine Ratio: 15 (ref 9–20)
BUN: 14 mg/dL (ref 6–24)
Bilirubin Total: 0.9 mg/dL (ref 0.0–1.2)
CO2: 21 mmol/L (ref 20–29)
Calcium: 9.4 mg/dL (ref 8.7–10.2)
Chloride: 101 mmol/L (ref 96–106)
Creatinine, Ser: 0.95 mg/dL (ref 0.76–1.27)
GFR calc Af Amer: 108 mL/min/1.73
GFR calc non Af Amer: 94 mL/min/1.73
Globulin, Total: 3.1 g/dL (ref 1.5–4.5)
Glucose: 125 mg/dL — ABNORMAL HIGH (ref 65–99)
Potassium: 3.6 mmol/L (ref 3.5–5.2)
Sodium: 140 mmol/L (ref 134–144)
Total Protein: 7 g/dL (ref 6.0–8.5)

## 2018-12-12 LAB — CBC
Hematocrit: 36.7 % — ABNORMAL LOW (ref 37.5–51.0)
Hemoglobin: 13.5 g/dL (ref 13.0–17.7)
MCH: 36.1 pg — ABNORMAL HIGH (ref 26.6–33.0)
MCHC: 36.8 g/dL — ABNORMAL HIGH (ref 31.5–35.7)
MCV: 98 fL — ABNORMAL HIGH (ref 79–97)
Platelets: 117 10*3/uL — ABNORMAL LOW (ref 150–450)
RBC: 3.74 x10E6/uL — ABNORMAL LOW (ref 4.14–5.80)
RDW: 14.6 % (ref 11.6–15.4)
WBC: 6.4 10*3/uL (ref 3.4–10.8)

## 2018-12-15 ENCOUNTER — Other Ambulatory Visit: Payer: Self-pay | Admitting: Nurse Practitioner

## 2018-12-15 DIAGNOSIS — E781 Pure hyperglyceridemia: Secondary | ICD-10-CM

## 2018-12-15 MED ORDER — ROSUVASTATIN CALCIUM 20 MG PO TABS: 20.0000 mg | ORAL_TABLET | Freq: Every day | ORAL | 3 refills | Status: DC

## 2018-12-16 MED FILL — ?ROSUVASTATIN CALCIUM 20MG: 20 | 30 days supply | Qty: 30 | Fill #0

## 2018-12-18 NOTE — Progress Notes (Signed)
CMA attempt to reach patient to inform on results.  No answer and unable to leave a VM for patient due to mailbox is full.  CMA will mail out a letter to reach patient.

## 2018-12-22 ENCOUNTER — Other Ambulatory Visit: Payer: Medicaid Other

## 2019-01-09 ENCOUNTER — Other Ambulatory Visit: Payer: Self-pay

## 2019-01-09 ENCOUNTER — Encounter: Payer: Self-pay | Admitting: Nurse Practitioner

## 2019-01-09 ENCOUNTER — Ambulatory Visit: Payer: Self-pay | Attending: Nurse Practitioner | Admitting: Nurse Practitioner

## 2019-01-09 DIAGNOSIS — I10 Essential (primary) hypertension: Secondary | ICD-10-CM

## 2019-01-09 DIAGNOSIS — K219 Gastro-esophageal reflux disease without esophagitis: Secondary | ICD-10-CM

## 2019-01-09 DIAGNOSIS — E781 Pure hyperglyceridemia: Secondary | ICD-10-CM

## 2019-01-09 MED ORDER — AMLODIPINE BESYLATE 10 MG PO TABS: ORAL_TABLET | ORAL | 2 refills | Status: DC

## 2019-01-09 MED ORDER — LOSARTAN POTASSIUM 50 MG PO TABS: 50.0000 mg | ORAL_TABLET | Freq: Every day | ORAL | 2 refills | Status: DC

## 2019-01-09 MED ORDER — GEMFIBROZIL 600 MG PO TABS: ORAL_TABLET | ORAL | 3 refills | Status: DC

## 2019-01-09 NOTE — Progress Notes (Signed)
Assessment & Plan:  Victor Victor [name] was seen today for blood pressure check.  Diagnoses and all orders for this visit:  Essential Victor [name] -     amLODipine (NORVASC) 10 MG tablet; TAKE 1 TABLET BY MOUTH DAILY. Patient will pick up scripts Monday 01-12-2019 -     losartan (COZAAR) 50 MG tablet; Take 1 tablet (50 mg total) by mouth daily. Patient will pick up scripts Monday 01-12-2019 Continue all antihypertensives as prescribed.  Remember to bring in your blood pressure log with you for your follow up appointment.  DASH/Mediterranean Diets are healthier choices for HTN.    Hypertriglyceridemia -     gemfibrozil (LOPID) 600 MG tablet; TAKE 1 TABLET BY MOUTH 2 TIMES DAILY BEFORE A MEAL. Patient will pick up scripts Monday 01-12-2019 INSTRUCTIONS: Work on a low fat, heart healthy diet and participate in regular aerobic exercise program by working out at least 150 minutes per week; 5 days a week-30 minutes per day. Avoid red meat, fried foods. junk foods, sodas, sugary drinks, unhealthy snacking, alcohol and smoking.  Drink at least 48oz of water per day and monitor your carbohydrate intake daily.    Gastroesophageal reflux disease, esophagitis presence not specified Continue omeprazole 20 mg daily as prescribed. INSTRUCTIONS: Avoid GERD Triggers: acidic, spicy or fried foods, caffeine, coffee, sodas,  alcohol and chocolate.  INSTRUCTIONS: Avoid GERD Triggers: acidic, spicy or fried foods, caffeine, coffee, sodas,  alcohol and chocolate.    Patient has been counseled on age-appropriate routine health concerns for screening and prevention. These are reviewed and up-to-date. Referrals have been placed accordingly. Immunizations are up-to-date or declined.    Subjective:   Chief Complaint  Patient presents with  . Blood Pressure Check    Pt. is here for blood pressure check.    HPI Victor Victor [name] 48 y.o. male presents to office today for follow-up to Victor [name], dyslipidemia.  has a past medical  history of Dizziness (10/15/2013), Victor [name], Hypertriglyceridemia, and Obesity.   Essential Victor [name] Chronic and well controlled. Current medications: amlodipine 10 mg, losartan 50 mg daily. Denies chest pain, shortness of breath, palpitations, lightheadedness, dizziness, headaches or BLE edema. Taking both medications as prescribed. He does not monitor his blood pressure at home.  BP Readings from Last 3 Encounters:  01/09/19 138/75  10/01/18 (!) 147/86  08/08/17 (!) 164/77   Dyslipidemia  He is medication compliant taking lopid 600 and rosuvastatin 20 mg daily. He is not diet compliant and denies  statin intolerance including myalgias. Triglycerides are extremely high. GGT/Lipase pending. LFTs only mildly elevated.  Lab Results  Component Value Date   CHOL 222 (H) 12/11/2018   Lab Results  Component Value Date   HDL 33 (L) 12/11/2018   Lab Results  Component Value Date   LDLCALC Comment 12/11/2018   Lab Results  Component Value Date   TRIG 825 (HH) 12/11/2018   Lab Results  Component Value Date   CHOLHDL 6.7 (H) 12/11/2018    Review of Systems  Constitutional: Negative for fever, malaise/fatigue and weight loss.  HENT: Negative.  Negative for nosebleeds.   Eyes: Negative.  Negative for blurred vision, double vision and photophobia.  Respiratory: Negative.  Negative for cough and shortness of breath.   Cardiovascular: Negative.  Negative for chest pain, palpitations and leg swelling.  Gastrointestinal: Negative.  Negative for heartburn, nausea and vomiting.  Musculoskeletal: Negative.  Negative for myalgias.  Neurological: Negative.  Negative for dizziness, focal weakness, seizures and headaches.  Psychiatric/Behavioral: Negative.  Negative for suicidal  ideas.    Past Medical History:  Diagnosis Date  . Dizziness 10/15/2013    "SWIMMY HEAD    "  . Victor [name]   . Hypertriglyceridemia   . Obesity     Past Surgical History:  Procedure Laterality Date  .  NO PAST SURGERIES      Family History  Problem Relation Age of Onset  . CAD Mother        stenting @ 37; alive @ 57  . Aneurysm Mother        thoracic s/p surgery @ 24  . Lung disease Mother   . Other Father        unknown    Social History Reviewed with no changes to be made today.   Outpatient Medications Prior to Visit  Medication Sig Dispense Refill  . aspirin 81 MG EC tablet Take 1 tablet (81 mg total) by mouth daily. 30 tablet 4  . omeprazole (PRILOSEC) 20 MG capsule Take 1 capsule (20 mg total) by mouth daily. 90 capsule 0  . rosuvastatin (CRESTOR) 20 MG tablet Take 1 tablet (20 mg total) by mouth daily. 90 tablet 3  . amLODipine (NORVASC) 10 MG tablet TAKE 1 TABLET BY MOUTH DAILY. 30 tablet 2  . gemfibrozil (LOPID) 600 MG tablet TAKE 1 TABLET BY MOUTH 2 TIMES DAILY BEFORE A MEAL. 60 tablet 3  . losartan (COZAAR) 50 MG tablet Take 1 tablet (50 mg total) by mouth daily. 90 tablet 2  . ranitidine (ZANTAC) 150 MG tablet TAKE 1 TABLET BY MOUTH 2 TIMES DAILY. (Patient not taking: Reported on 10/01/2018) 60 tablet 1   No facility-administered medications prior to visit.     No Known Allergies     Objective:    BP 138/75 (BP Location: Left Arm, Patient Position: Sitting, Cuff Size: Normal)   Pulse 87   Temp 99 F (37.2 C) (Oral)   Ht 5\' 6"  (1.676 m)   Wt 197 lb (89.4 kg)   SpO2 98%   BMI 31.80 kg/m  Wt Readings from Last 3 Encounters:  01/09/19 197 lb (89.4 kg)  10/01/18 205 lb (93 kg)  08/08/17 228 lb (103.4 kg)    Physical Exam Vitals signs and nursing note reviewed.  Constitutional:      Appearance: He is well-developed.  HENT:     Head: Normocephalic and atraumatic.  Neck:     Musculoskeletal: Normal range of motion.  Cardiovascular:     Rate and Rhythm: Normal rate and regular rhythm.     Heart sounds: Murmur present. No friction rub. No gallop.   Pulmonary:     Effort: Pulmonary effort is normal. No tachypnea or respiratory distress.     Breath  sounds: Normal breath sounds. No decreased breath sounds, wheezing, rhonchi or rales.  Chest:     Chest wall: No tenderness.  Abdominal:     General: Bowel sounds are normal.     Palpations: Abdomen is soft.  Musculoskeletal: Normal range of motion.  Skin:    General: Skin is warm and dry.  Neurological:     Mental Status: He is alert and oriented to person, place, and time.     Coordination: Coordination normal.  Psychiatric:        Behavior: Behavior normal. Behavior is cooperative.        Thought Content: Thought content normal.        Judgment: Judgment normal.          Patient has been counseled extensively about  nutrition and exercise as well as the importance of adherence with medications and regular follow-up. The patient was given clear instructions to go to ER or return to medical center if symptoms don't improve, worsen or new problems develop. The patient verbalized understanding.   Follow-up: Return in about 3 months (around 04/11/2019) for FASTING labs and HTN.   Claiborne RiggZelda W Meloney Feld, FNP-BC [facility] [place], [place] [phone]   01/09/2019, 7:40 PM

## 2019-01-10 LAB — GAMMA GT: GGT: 630 IU/L — ABNORMAL HIGH (ref 0–65)

## 2019-01-10 LAB — TSH: TSH: 3.43 u[IU]/mL (ref 0.450–4.500)

## 2019-01-10 LAB — HEMOGLOBIN A1C
Est. average glucose Bld gHb Est-mCnc: 108 mg/dL
Hgb A1c MFr Bld: 5.4 % (ref 4.8–5.6)

## 2019-01-10 LAB — LIPASE: Lipase: 73 U/L (ref 13–78)

## 2019-01-10 LAB — HIV ANTIBODY (ROUTINE TESTING W REFLEX): HIV Screen 4th Generation wRfx: NONREACTIVE

## 2019-01-14 NOTE — Progress Notes (Signed)
CMA spoke to patient to inform on results.  Pt. Verified DOB. Pt. Understood.  

## 2019-01-21 MED FILL — GEMFIBROZIL 600 MG TAB: 600 | 30 days supply | Qty: 60 | Fill #0

## 2019-01-21 MED FILL — LOSARTAN POTASSIUM 50 MG TA: 50 | 30 days supply | Qty: 30 | Fill #0

## 2019-01-21 MED FILL — ?AMLODIPINE BESYLATE 10 MG: 10 | 30 days supply | Qty: 30 | Fill #0

## 2019-02-02 MED FILL — ?ROSUVASTATIN CALCIUM 20MG: 20 | 30 days supply | Qty: 30 | Fill #1

## 2019-03-20 MED FILL — ?AMLODIPINE BESYLATE 10 MG: 10 | 30 days supply | Qty: 30 | Fill #1

## 2019-03-20 MED FILL — LOSARTAN POTASSIUM 50 MG TA: 50 | 30 days supply | Qty: 30 | Fill #1

## 2019-04-13 ENCOUNTER — Encounter: Payer: Self-pay | Admitting: Nurse Practitioner

## 2019-04-13 ENCOUNTER — Other Ambulatory Visit: Payer: Self-pay

## 2019-04-13 ENCOUNTER — Ambulatory Visit: Payer: Self-pay | Attending: Nurse Practitioner | Admitting: Nurse Practitioner

## 2019-04-13 DIAGNOSIS — Z1211 Encounter for screening for malignant neoplasm of colon: Secondary | ICD-10-CM

## 2019-04-13 DIAGNOSIS — D649 Anemia, unspecified: Secondary | ICD-10-CM

## 2019-04-13 DIAGNOSIS — I10 Essential (primary) hypertension: Secondary | ICD-10-CM

## 2019-04-13 DIAGNOSIS — E781 Pure hyperglyceridemia: Secondary | ICD-10-CM

## 2019-04-13 MED ORDER — LOSARTAN POTASSIUM 50 MG PO TABS: 50.0000 mg | ORAL_TABLET | Freq: Every day | ORAL | 2 refills | Status: DC

## 2019-04-13 MED ORDER — ROSUVASTATIN CALCIUM 20 MG PO TABS: 20.0000 mg | ORAL_TABLET | Freq: Every day | ORAL | 2 refills | Status: DC

## 2019-04-13 MED ORDER — GEMFIBROZIL 600 MG PO TABS: ORAL_TABLET | ORAL | 3 refills | Status: DC

## 2019-04-13 MED ORDER — AMLODIPINE BESYLATE 10 MG PO TABS: ORAL_TABLET | ORAL | 2 refills | Status: DC

## 2019-04-13 MED FILL — GEMFIBROZIL 600 MG TAB: 600 | 30 days supply | Qty: 60 | Fill #0

## 2019-04-13 MED FILL — ?ROSUVASTATIN CALCIUM 20MG: 20 | 30 days supply | Qty: 30 | Fill #0

## 2019-04-13 MED FILL — LOSARTAN POTASSIUM 50 MG TA: 50 | 30 days supply | Qty: 30 | Fill #0

## 2019-04-13 MED FILL — ?AMLODIPINE BESYLATE 10 MG: 10 | 30 days supply | Qty: 30 | Fill #0

## 2019-04-13 NOTE — Progress Notes (Signed)
Virtual Visit via Telephone Note Due to national recommendations of social distancing due to [place] 19, telehealth visit is felt to be most appropriate for this patient at this time.  I discussed the limitations, risks, security and privacy concerns of performing an evaluation and management service by telephone and the availability of in person appointments. I also discussed with the patient that there may be a patient responsible charge related to this service. The patient expressed understanding and agreed to proceed.    I connected with Victor [name] on 04/13/19  at   9:10 AM EDT  EDT by telephone and verified that I am speaking with the correct person using two identifiers.   Consent I discussed the limitations, risks, security and privacy concerns of performing an evaluation and management service by telephone and the availability of in person appointments. I also discussed with the patient that there may be a patient responsible charge related to this service. The patient expressed understanding and agreed to proceed.   Location of Patient: Private Residence   Location of Provider: [place] and [employer] Office    Persons participating in Telemedicine visit: [name] FNP-BC [place]    History of Present Illness: Telemedicine visit for: HTN   Essential Hypertension He does endorse blurred vision which is chronic and ongoing for a few years.  He has not seen an ophthalmologist in several years. Denies chest pain, shortness of breath, palpitations, lightheadedness, dizziness, headaches or BLE edema. Taking amlodipine 82m and losartan 50 mg daily as prescribed. He checks his blood pressure at the local pharmacy "from time to time" reports readings as "normal".  BP Readings from Last 3 Encounters:  01/09/19 138/75  10/01/18 (!) 147/86  08/08/17 (!) 164/77   Hyperlipidemia Patient presents for follow up to hyperlipidemia.  He is medication compliant  taking lopid 600 mg BID and crestor 20 mg daily. He is not diet compliant and denies statin intolerance including myalgias.  Lab Results  Component Value Date   CHOL 222 (H) 12/11/2018   Lab Results  Component Value Date   HDL 33 (L) 12/11/2018   Lab Results  Component Value Date   LDLCALC Comment 12/11/2018   Lab Results  Component Value Date   [place]IG [place]25 ([place] 12/11/2018   Lab Results  Component Value Date   CHOLHDL 6.7 (H) 12/11/2018     Past Medical History:  Diagnosis Date  . Dizziness 10/15/2013    "SWIMMY HEAD    "  . Hypertension   . Hypertriglyceridemia   . Obesity     Past Surgical History:  Procedure Laterality Date  . NO PAST SURGERIES      Family History  Problem Relation Age of Onset  . CAD Mother        stenting @ 568 alive @ 55 . Aneurysm Mother        thoracic s/p surgery @ 569 . Lung disease Mother   . Other Father        unknown    Social History   Socioeconomic History  . Marital status: Single    Spouse name: Not on file  . Number of children: Not on file  . Years of education: Not on file  . Highest education level: Not on file  Occupational History  . Not on file  Social Needs  . Financial resource strain: Not on file  . Food insecurity    Worry: Not on file    Inability: Not  on file  . Transportation needs    Medical: Not on file    Non-medical: Not on file  Tobacco Use  . Smoking status: Never Smoker  . Smokeless tobacco: Current User    Types: Chew  Substance and Sexual Activity  . Alcohol use: Yes    Comment: 4 12 oz beers twice/wk.  . Drug use: No  . Sexual activity: Yes  Lifestyle  . Physical activity    Days per week: Not on file    Minutes per session: Not on file  . Stress: Not on file  Relationships  . Social Herbalist on phone: Not on file    Gets together: Not on file    Attends religious service: Not on file    Active member of club or organization: Not on file    Attends meetings of clubs  or organizations: Not on file    Relationship status: Not on file  Other Topics Concern  . Not on file  Social History Narrative   Lives in [place].  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     Observations/Objective: Awake, alert and oriented x 3   Review of Systems  Constitutional: Negative for fever, malaise/fatigue and weight loss.  HENT: Negative.  Negative for nosebleeds.   Eyes: Positive for blurred vision (chronic). Negative for double vision, photophobia, pain, discharge and redness.  Respiratory: Negative.  Negative for cough and shortness of breath.   Cardiovascular: Negative.  Negative for chest pain, palpitations and leg swelling.  Gastrointestinal: Negative.  Negative for heartburn, nausea and vomiting.  Musculoskeletal: Negative.  Negative for myalgias.  Neurological: Negative.  Negative for dizziness, focal weakness, seizures and headaches.  Psychiatric/Behavioral: Negative.  Negative for suicidal ideas.    Assessment and Plan: Victor [name] was seen today for follow-up.  Diagnoses and all orders for this visit:  Essential hypertension -     losartan (COZAAR) 50 MG tablet; Take 1 tablet (50 mg total) by mouth daily. -     amLODipine (NORVASC) 10 MG tablet; TAKE 1 TABLET BY MOUTH DAILY. -     CMP14+EGFR; Future Continue all antihypertensives as prescribed.  Remember to bring in your blood pressure log with you for your follow up appointment.  DASH/Mediterranean Diets are healthier choices for HTN.    Hypertriglyceridemia -     gemfibrozil (LOPID) 600 MG tablet; TAKE 1 TABLET BY MOUTH 2 TIMES DAILY BEFORE A MEAL. Patient will pick up scripts Monday 01-12-2019 -     rosuvastatin (CRESTOR) 20 MG tablet; Take 1 tablet (20 mg total) by mouth daily. -     Lipid panel; Future INSTRUCTIONS: Work on a low fat, heart healthy diet and participate in regular aerobic exercise program by working out at least 150 minutes per week; 5 days a week-30 minutes per day. Avoid red  meat, fried foods. junk foods, sodas, sugary drinks, unhealthy snacking, alcohol and smoking.  Drink at least 48oz of water per day and monitor your carbohydrate intake daily.    Colon cancer screening -     Fecal occult blood, imunochemical; Future  Anemia, unspecified type -     CBC; Future    Follow Up Instructions Return in about 3 months (around 07/13/2019) for HTN/HPL.     I discussed the assessment and treatment plan with the patient. The patient was provided an opportunity to ask questions and all were answered. The patient agreed with the plan and demonstrated an understanding  of the instructions.   The patient was advised to call back or seek an in-person evaluation if the symptoms worsen or if the condition fails to improve as anticipated.  I provided 21 minutes of non-face-to-face time during this encounter including median intraservice time, reviewing previous notes, labs, imaging, medications and explaining diagnosis and management.  [name], FNP-BC

## 2019-04-16 ENCOUNTER — Other Ambulatory Visit: Payer: Self-pay

## 2019-04-16 ENCOUNTER — Ambulatory Visit: Payer: Self-pay | Attending: Nurse Practitioner

## 2019-04-16 DIAGNOSIS — E781 Pure hyperglyceridemia: Secondary | ICD-10-CM

## 2019-04-16 DIAGNOSIS — I10 Essential (primary) hypertension: Secondary | ICD-10-CM

## 2019-04-16 DIAGNOSIS — D649 Anemia, unspecified: Secondary | ICD-10-CM

## 2019-04-16 DIAGNOSIS — Z1211 Encounter for screening for malignant neoplasm of colon: Secondary | ICD-10-CM

## 2019-04-17 LAB — CMP14+EGFR
ALT: 32 IU/L (ref 0–44)
AST: 40 IU/L (ref 0–40)
Albumin/Globulin Ratio: 1.4 (ref 1.2–2.2)
Albumin: 4.6 g/dL (ref 4.0–5.0)
Alkaline Phosphatase: 59 IU/L (ref 39–117)
BUN/Creatinine Ratio: 21 — ABNORMAL HIGH (ref 9–20)
BUN: 20 mg/dL (ref 6–24)
Bilirubin Total: 0.5 mg/dL (ref 0.0–1.2)
CO2: 23 mmol/L (ref 20–29)
Calcium: 9.5 mg/dL (ref 8.7–10.2)
Chloride: 102 mmol/L (ref 96–106)
Creatinine, Ser: 0.96 mg/dL (ref 0.76–1.27)
GFR calc Af Amer: 106 mL/min/{1.73_m2} (ref >59)
GFR calc non Af Amer: 92 mL/min/{1.73_m2} (ref >59)
Globulin, Total: 3.4 g/dL (ref 1.5–4.5)
Glucose: 94 mg/dL (ref 65–99)
Potassium: 4.3 mmol/L (ref 3.5–5.2)
Sodium: 138 mmol/L (ref 134–144)
Total Protein: 8 g/dL (ref 6.0–8.5)

## 2019-04-17 LAB — CBC
Hematocrit: 40.7 % (ref 37.5–51.0)
Hemoglobin: 13.5 g/dL (ref 13.0–17.7)
MCH: 33.4 pg — ABNORMAL HIGH (ref 26.6–33.0)
MCHC: 33.2 g/dL (ref 31.5–35.7)
MCV: 101 fL — ABNORMAL HIGH (ref 79–97)
Platelets: 171 10*3/uL (ref 150–450)
RBC: 4.04 x10E6/uL — ABNORMAL LOW (ref 4.14–5.80)
RDW: 13.6 % (ref 11.6–15.4)
WBC: 5.9 10*3/uL (ref 3.4–10.8)

## 2019-04-17 LAB — LIPID PANEL
Chol/HDL Ratio: 3.3 ratio (ref 0.0–5.0)
Cholesterol, Total: 169 mg/dL (ref 100–199)
HDL: 52 mg/dL (ref >39)
LDL Chol Calc (NIH): 62 mg/dL (ref 0–99)
Triglycerides: 355 mg/dL — ABNORMAL HIGH (ref 0–149)
VLDL Cholesterol Cal: 55 mg/dL — ABNORMAL HIGH (ref 5–40)

## 2019-06-10 MED FILL — LOSARTAN POTASSIUM 50 MG TA: 50 | 30 days supply | Qty: 30 | Fill #0

## 2019-06-10 MED FILL — ?AMLODIPINE BESYLATE 10 MG: 10 | 30 days supply | Qty: 30 | Fill #1

## 2019-06-22 MED FILL — ?ROSUVASTATIN CALCIUM 20MG: 20 | 30 days supply | Qty: 30 | Fill #1

## 2019-07-06 ENCOUNTER — Ambulatory Visit: Payer: Medicaid Other | Admitting: Nurse Practitioner

## 2019-08-14 ENCOUNTER — Ambulatory Visit: Payer: Medicaid Other | Admitting: Nurse Practitioner

## 2019-10-06 MED FILL — AMLODIPINE BESYLATE 10 MG T: 10 | 30 days supply | Qty: 30 | Fill #2

## 2019-10-06 MED FILL — LOSARTAN POTASSIUM 50 MG TA: 50 | 30 days supply | Qty: 30 | Fill #1

## 2019-10-06 MED FILL — ?ROSUVASTATIN CALC 20MG TA: 20 | 30 days supply | Qty: 30 | Fill #2

## 2019-10-26 ENCOUNTER — Emergency Department (HOSPITAL_COMMUNITY)
Admission: EM | Admit: 2019-10-26 | Discharge: 2019-10-26 | Disposition: A | Discharge status: Home / Self Care | Payer: Medicaid Other | Attending: Emergency Medicine | Admitting: Emergency Medicine

## 2019-10-26 ENCOUNTER — Encounter (HOSPITAL_COMMUNITY): Payer: Self-pay | Admitting: Emergency Medicine

## 2019-10-26 ENCOUNTER — Other Ambulatory Visit: Payer: Self-pay

## 2019-10-26 DIAGNOSIS — Z7982 Long term (current) use of aspirin: Secondary | ICD-10-CM | POA: Insufficient documentation

## 2019-10-26 DIAGNOSIS — K4090 Unilateral inguinal hernia, without obstruction or gangrene, not specified as recurrent: Secondary | ICD-10-CM | POA: Insufficient documentation

## 2019-10-26 DIAGNOSIS — I10 Essential (primary) hypertension: Secondary | ICD-10-CM | POA: Insufficient documentation

## 2019-10-26 DIAGNOSIS — Z79899 Other long term (current) drug therapy: Secondary | ICD-10-CM | POA: Insufficient documentation

## 2019-10-26 NOTE — Discharge Instructions (Signed)
You were seen in the ER for a mass in your groin which is called a inguinal hernia. This is not life threatening as long as your able to push it back in. If you have a sudden onset of pain and are not able to push it back in, your need to come back to the ER. I have provided the phone number to a general surgery practice, please schedule an appointment with them as you may require surgery for this.

## 2019-10-26 NOTE — ED Triage Notes (Signed)
Pt endorses a knot in his right side of his groin for a week that has gotten bigger.

## 2019-10-26 NOTE — ED Provider Notes (Addendum)
[name] Provider Note   CSN: [id number] Arrival date & time: 10/26/19  1116     History Chief Complaint  Patient presents with  . Groin Pain    Victor [name] is a 49 y.o. male.  HPI 49 year old male with a history of hypertension, obesity presents to the ER with a mass in his right groin.  Patient states that the mass is not painful, and comes and goes.  He notices that it becomes bigger with movement, but is able to push it back in.  He denies nausea, vomiting, fever, pain, urinary retention/dysuria, penile pain/swelling, scrotal pain/swelling. States he is worried because he has never had this before.    Past Medical History:  Diagnosis Date  . Dizziness 10/15/2013    "SWIMMY HEAD    "  . Hypertension   . Hypertriglyceridemia   . Obesity     Patient Active Problem List   Diagnosis Date Noted  . HTN (hypertension) 12/06/2014  . Dyspnea 10/16/2013  . Hypertriglyceridemia   . Dizziness 10/15/2013  . Weakness 10/15/2013  . Abnormal EKG 10/15/2013    Past Surgical History:  Procedure Laterality Date  . NO PAST SURGERIES         Family History  Problem Relation Age of Onset  . CAD Mother        stenting @ 50; alive @ 64  . Aneurysm Mother        thoracic s/p surgery @ 45  . Lung disease Mother   . Other Father        unknown    Social History   Tobacco Use  . Smoking status: Never Smoker  . Smokeless tobacco: Current User    Types: Chew  Substance Use Topics  . Alcohol use: Yes    Comment: 4 12 oz beers twice/wk.  . Drug use: No    Home Medications Prior to Admission medications   Medication Sig Start Date End Date Taking? Authorizing Provider  amLODipine (NORVASC) 10 MG tablet TAKE 1 TABLET BY MOUTH DAILY. 04/13/19   [name], NP  aspirin 81 MG EC tablet Take 1 tablet (81 mg total) by mouth daily. 05/06/14   Lance Bosch, NP  gemfibrozil (LOPID) 600 MG tablet TAKE 1 TABLET BY MOUTH 2 TIMES DAILY  BEFORE A MEAL. Patient will pick up scripts Monday 01-12-2019 04/13/19   [name], NP  losartan (COZAAR) 50 MG tablet Take 1 tablet (50 mg total) by mouth daily. 04/13/19 07/12/19  [name], NP  omeprazole (PRILOSEC) 20 MG capsule Take 1 capsule (20 mg total) by mouth daily. 12/09/18 03/09/19  [name], NP  ranitidine (ZANTAC) 150 MG tablet TAKE 1 TABLET BY MOUTH 2 TIMES DAILY. Patient not taking: Reported on 10/01/2018 01/09/17   Alfonse Spruce, FNP  rosuvastatin (CRESTOR) 20 MG tablet Take 1 tablet (20 mg total) by mouth daily. 04/13/19 07/12/19  [name], NP    Allergies    Patient has no known allergies.  Review of Systems   Review of Systems  Gastrointestinal: Negative for abdominal pain.  Genitourinary: Negative for decreased urine volume, difficulty urinating, dysuria, flank pain, frequency, genital sores, penile pain, penile swelling, scrotal swelling and urgency.  Musculoskeletal: Negative for arthralgias and back pain.  Skin: Negative for color change, pallor, rash and wound.    Physical Exam Updated Vital Signs BP (!) 150/75   Pulse 85   Temp 98.8 F (37.1 C)  Resp 16   Ht 5\' 7"  (1.702 m)   Wt 93 kg   SpO2 100%   BMI 32.11 kg/m   Physical Exam Exam conducted with a chaperone present.  Abdominal:     Hernia: A hernia is present. Hernia is present in the right inguinal area. There is no hernia in the left inguinal area.  Genitourinary:    Pubic Area: No rash.      Penis: Normal and circumcised. No swelling.      Testes: Normal. Cremasteric reflex is present.        Right: Mass, tenderness or testicular hydrocele not present.        Left: Mass, tenderness or testicular hydrocele not present.     Epididymis:     Right: Normal.     Left: Normal.     Comments: +cremasteric reflex  Musculoskeletal:        General: No swelling, tenderness, deformity or signs of injury.     Comments: 3-4 cm mass in right inguinal canal, easily reducible  without any pain. Stayed reduced through course of stay. No warmth, erythema, fluctuance or signs of cellulitis. Full range of motion in right hip  Lymphadenopathy:     Lower Body: No right inguinal adenopathy. No left inguinal adenopathy.     ED Results / Procedures / Treatments   Labs (all labs ordered are listed, but only abnormal results are displayed) Labs Reviewed - No data to display  EKG None  Radiology No results found.  Procedures Procedures (including critical care time)  Medications Ordered in ED Medications - No data to display  ED Course  I have reviewed the triage vital signs and the nursing notes.  Pertinent labs & imaging results that were available during my care of the patient were reviewed by me and considered in my medical decision making (see chart for details).    MDM Rules/Calculators/A&P                     49 year old male with a mass in his groin. Patient is mildly hypertensive but afebrile on presentation to the ER.  In no acute distress.  Hernia appreciable in right inguinal canal.  Nontender to palpation, easily reducible in the ER.  Patient has full range of motion of hip with no pain. Suspicion for incarcerated hernia, cellultis, epididymitis, testicular torsion, lymphoma low. Given hernia is easily reducible, will refer to general surgery for further follow-up.  Patient voices understanding is agreeable to this plan. Final Clinical Impression(s) / ED Diagnoses Final diagnoses:  Right inguinal hernia    Rx / DC Orders ED Discharge Orders    None       42, PA-C 10/26/19 1441    12/26/19, PA-C 10/26/19 1442    12/26/19, MD 10/27/19 [phone]

## 2019-12-30 ENCOUNTER — Other Ambulatory Visit: Payer: Self-pay

## 2019-12-30 ENCOUNTER — Encounter (HOSPITAL_COMMUNITY): Payer: Self-pay | Admitting: Emergency Medicine

## 2019-12-30 ENCOUNTER — Emergency Department (HOSPITAL_COMMUNITY)
Admission: EM | Admit: 2019-12-30 | Discharge: 2019-12-31 | Disposition: A | Discharge status: Home / Self Care | Payer: Self-pay | Attending: Emergency Medicine | Admitting: Emergency Medicine

## 2019-12-30 DIAGNOSIS — I10 Essential (primary) hypertension: Secondary | ICD-10-CM | POA: Insufficient documentation

## 2019-12-30 DIAGNOSIS — R1031 Right lower quadrant pain: Secondary | ICD-10-CM | POA: Insufficient documentation

## 2019-12-30 DIAGNOSIS — K4090 Unilateral inguinal hernia, without obstruction or gangrene, not specified as recurrent: Secondary | ICD-10-CM | POA: Insufficient documentation

## 2019-12-30 DIAGNOSIS — F17220 Nicotine dependence, chewing tobacco, uncomplicated: Secondary | ICD-10-CM | POA: Insufficient documentation

## 2019-12-30 LAB — URINALYSIS, ROUTINE W REFLEX MICROSCOPIC
Bilirubin Urine: NEGATIVE
Glucose, UA: NEGATIVE mg/dL
Hgb urine dipstick: NEGATIVE
Ketones, ur: NEGATIVE mg/dL
Nitrite: NEGATIVE
Protein, ur: NEGATIVE mg/dL
Specific Gravity, Urine: 1.023 (ref 1.005–1.030)
pH: 5 (ref 5.0–8.0)

## 2019-12-30 LAB — COMPREHENSIVE METABOLIC PANEL
ALT: 69 U/L — ABNORMAL HIGH (ref 0–44)
AST: 107 U/L — ABNORMAL HIGH (ref 15–41)
Albumin: 3.5 g/dL (ref 3.5–5.0)
Alkaline Phosphatase: 58 U/L (ref 38–126)
Anion gap: 15 (ref 5–15)
BUN: 13 mg/dL (ref 6–20)
CO2: 19 mmol/L — ABNORMAL LOW (ref 22–32)
Calcium: 9.1 mg/dL (ref 8.9–10.3)
Chloride: 107 mmol/L (ref 98–111)
Creatinine, Ser: 0.99 mg/dL (ref 0.61–1.24)
GFR calc Af Amer: >60 mL/min (ref >60)
GFR calc non Af Amer: >60 mL/min (ref >60)
Glucose, Bld: 116 mg/dL — ABNORMAL HIGH (ref 70–99)
Potassium: 3.6 mmol/L (ref 3.5–5.1)
Sodium: 141 mmol/L (ref 135–145)
Total Bilirubin: 0.6 mg/dL (ref 0.3–1.2)
Total Protein: 7.3 g/dL (ref 6.5–8.1)

## 2019-12-30 LAB — CBC
HCT: 35 % — ABNORMAL LOW (ref 39.0–52.0)
Hemoglobin: 11.1 g/dL — ABNORMAL LOW (ref 13.0–17.0)
MCH: 32.7 pg (ref 26.0–34.0)
MCHC: 31.7 g/dL (ref 30.0–36.0)
MCV: 103.2 fL — ABNORMAL HIGH (ref 80.0–100.0)
Platelets: 113 10*3/uL — ABNORMAL LOW (ref 150–400)
RBC: 3.39 MIL/uL — ABNORMAL LOW (ref 4.22–5.81)
RDW: 13.2 % (ref 11.5–15.5)
WBC: 4.9 10*3/uL (ref 4.0–10.5)
nRBC: 0 % (ref 0.0–0.2)

## 2019-12-30 LAB — LIPASE, BLOOD: Lipase: 45 U/L (ref 11–51)

## 2019-12-30 MED ORDER — FENTANYL CITRATE (PF) 100 MCG/2ML IJ SOLN
50.0000 ug | Freq: Once | INTRAMUSCULAR | Status: AC
  Administered 2019-12-30: 50 ug via INTRAVENOUS
  Filled 2019-12-30: qty 2

## 2019-12-30 MED ORDER — LACTATED RINGERS IV BOLUS
1000.0000 mL | Freq: Once | INTRAVENOUS | Status: AC
  Administered 2019-12-30: 1000 mL via INTRAVENOUS

## 2019-12-30 MED ORDER — SODIUM CHLORIDE 0.9% FLUSH
3.0000 mL | Freq: Once | INTRAVENOUS | Status: AC
  Administered 2019-12-30: 3 mL via INTRAVENOUS

## 2019-12-30 NOTE — ED Provider Notes (Signed)
Emergency Department Provider Note  I have reviewed the triage vital signs and the nursing notes.  HISTORY  Chief Complaint Groin Pain   HPI Victor [name] is a 49 y.o. male with medical problems documented below known right inguinal hernia presents the emerge department today secondary to worsening pain.  Patient states that he has had a hernia for quite a while but was told back in April for worsening pain to come here to get surgery.  Has no fevers, urinary changes or changes in his bowels.  No nausea or vomiting.  No new injuries.  States the size is not any worse is just the pain is worse.   No other associated or modifying symptoms.    Past Medical History:  Diagnosis Date  . Dizziness 10/15/2013    "SWIMMY HEAD    "  . Hypertension   . Hypertriglyceridemia   . Obesity     Patient Active Problem List   Diagnosis Date Noted  . HTN (hypertension) 12/06/2014  . Dyspnea 10/16/2013  . Hypertriglyceridemia   . Dizziness 10/15/2013  . Weakness 10/15/2013  . Abnormal EKG 10/15/2013    Past Surgical History:  Procedure Laterality Date  . NO PAST SURGERIES      Current Outpatient Rx  . Order #: [id number] Class: Normal  . Order #: [id number] Class: Print  . Order #: [id number] Class: Normal  . Order #: [id number] Class: Normal  . Order #: [id number] Class: Normal  . Order #: [id number] Class: Print  . Order #: [id number] Class: Normal  . Order #: [id number] Class: Print  . Order #: [id number] Class: Normal    Allergies Patient has no known allergies.  Family History  Problem Relation Age of Onset  . CAD Mother        stenting @ 54; alive @ 64  . Aneurysm Mother        thoracic s/p surgery @ 53  . Lung disease Mother   . Other Father        unknown    Social History Social History   Tobacco Use  . Smoking status: Never Smoker  . Smokeless tobacco: Current User    Types: Chew  Vaping Use  . Vaping Use: Never used  Substance Use Topics  . Alcohol use: Yes     Comment: 4 12 oz beers twice/wk.  . Drug use: No    Review of Systems  All other systems negative except as documented in the HPI. All pertinent positives and negatives as reviewed in the HPI. ____________________________________________  PHYSICAL EXAM:  VITAL SIGNS: ED Triage Vitals  Enc Vitals Group     BP 12/30/19 1619 (!) 154/83     Pulse Rate 12/30/19 1619 94     Resp 12/30/19 1619 16     Temp 12/30/19 1619 99.2 F (37.3 C)     Temp Source 12/30/19 2050 Oral     SpO2 12/30/19 1619 96 %     Weight 12/30/19 1620 205 lb (93 kg)     Height 12/30/19 1620 5\' 7"  (1.702 m)    Constitutional: Alert and oriented. Well appearing and in no acute distress. Eyes: Conjunctivae are normal. PERRL. EOMI. Head: Atraumatic. Nose: No congestion/rhinnorhea. Mouth/Throat: Mucous membranes are moist.  Oropharynx non-erythematous. Neck: No stridor.  No meningeal signs.   Cardiovascular: Normal rate, regular rhythm. Good peripheral circulation. Grossly normal heart sounds.   Respiratory: Normal respiratory effort.  No retractions. Lungs CTAB. Gastrointestinal: Soft and nontender. No distention. GU: small right sided inguinal hernia. Mild ttp. No erythema,  warmth, induration. Musculoskeletal: No lower extremity tenderness nor edema. No gross deformities of extremities. Neurologic:  Normal speech and language. No gross focal neurologic deficits are appreciated.  Skin:  Skin is warm, dry and intact. No rash noted.  ____________________________________________   LABS (all labs ordered are listed, but only abnormal results are displayed)  Labs Reviewed  COMPREHENSIVE METABOLIC PANEL - Abnormal; Notable for the following components:      Result Value   CO2 19 (*)    Glucose, Bld 116 (*)    AST 107 (*)    ALT 69 (*)    All other components within normal limits  CBC - Abnormal; Notable for the following components:   RBC 3.39 (*)    Hemoglobin 11.1 (*)    HCT 35.0 (*)    MCV 103.2 (*)     Platelets 113 (*)    All other components within normal limits  URINALYSIS, ROUTINE W REFLEX MICROSCOPIC - Abnormal; Notable for the following components:   Leukocytes,Ua SMALL (*)    Bacteria, UA RARE (*)    All other components within normal limits  LIPASE, BLOOD  LACTIC ACID, PLASMA  LACTIC ACID, PLASMA   ____________________________________________  EKG   EKG Interpretation  Date/Time:    Ventricular Rate:    PR Interval:    QRS Duration:   QT Interval:    QTC Calculation:   R Axis:     Text Interpretation:         ____________________________________________  RADIOLOGY  CT ABDOMEN PELVIS W CONTRAST  Result Date: 12/31/2019 CLINICAL DATA:  Palpable right groin mass EXAM: CT ABDOMEN AND PELVIS WITH CONTRAST TECHNIQUE: Multidetector CT imaging of the abdomen and pelvis was performed using the standard protocol following bolus administration of intravenous contrast. CONTRAST:  OMNIPAQUE IOHEXOL 300 MG/ML  SOLN COMPARISON:  None. FINDINGS: Lower chest: Lung bases are clear. Normal heart size. No pericardial effusion. Hepatobiliary: No focal liver abnormality is seen. No gallstones, gallbladder wall thickening, or biliary dilatation. Pancreas: Unremarkable. No pancreatic ductal dilatation or surrounding inflammatory changes. Spleen: Normal in size without focal abnormality. Adrenals/Urinary Tract: Normal adrenal glands. Kidneys are normally located with symmetric enhancement and excretion. No suspicious renal lesion, urolithiasis or hydronephrosis. Urinary bladder is unremarkable for the degree of distention. Stomach/Bowel: Distal esophagus, stomach and duodenum are free of acute abnormality. No small bowel thickening or dilatation. Portion of the terminal ileum and cecum as well as a normal air-filled appendix are contained within a right inguinal hernia. No evidence of resulting mechanical bowel obstruction or bowel wall/vascular compromise. More distal colonic segments are  unremarkable without wall thickening or dilatation. Scattered colonic diverticula without focal pericolonic inflammation to suggest diverticulitis. Vascular/Lymphatic: Minimal plaque in the right internal iliac artery. No other significant vascular findings are present. No enlarged abdominal or pelvic lymph nodes. Reproductive: The prostate and seminal vesicles are unremarkable. Other: Fat and bowel containing right inguinal hernia. Originates medial to the origin of the inferior epigastric vessels with a lateral fat crescent sign suggesting a direct type hernia with a 2.9 by 2.6 cm hernia neck (8/56, 3/73) Small fat containing left inguinal hernia as well. A fat containing paraumbilical hernia is noted as well just to the left of midline with a 9 x 9 mm fascial defect. Minimal stranding within the herniated fat contents could reflect a degree of mild strangulation. No abdominopelvic free fluid or air. Musculoskeletal: Multilevel degenerative changes are present in the imaged portions of the spine. Retrolisthesis L5 on S1 with  partially calcified posterior disc protrusion and endplate spurring resulting in at least moderate canal stenosis as well as severe bilateral foraminal narrowing. No acute osseous abnormality or suspicious osseous lesion. IMPRESSION: 1. Fat and bowel containing right inguinal hernia. Appearance suggest a direct inguinal hernia with a 2.9 by 2.6 cm hernia neck. No evidence of resulting mechanical bowel obstruction or bowel wall/vascular compromise involving the contained portions of the terminal ileum, cecum and appendix. 2. A fat containing paraumbilical hernia is noted as well just to the left of midline with a 9 x 9 mm fascial defect. Minimal stranding within the herniated fat contents could reflect a degree of mild strangulation. Correlate for point tenderness. 3. Multilevel degenerative changes in the imaged portions of the spine. Retrolisthesis L5 on S1 with partially calcified posterior  disc protrusion and endplate spurring resulting in at least moderate canal stenosis as well as severe bilateral foraminal narrowing. 4. Colonic diverticulosis without evidence of diverticulitis. Electronically Signed   By: Kreg Shropshire M.D.   On: 12/31/2019 01:09   ____________________________________________  PROCEDURES  Procedure(s) performed:   Procedures ____________________________________________  INITIAL IMPRESSION / ASSESSMENT AND PLAN / ED COURSE   This patient presents to the ED for concern of right inguinal hernia, this involves an extensive number of treatment options, and is a complaint that carries with it a high risk of complications and morbidity.  The differential diagnosis includes fournier's gangrene (unlikely without e/o infection), incarcerated hernia, testicular torsion (unlikely without testicular ttp, malrotation, normal cremaster).      Lab Tests:   I Ordered, reviewed, and interpreted labs, which included cbc/cmp/lactic/lipase/UA   Medicines ordered:   I ordered medication fentanyl/bolus  For mild acidosis and pain.    Imaging Studies ordered:   I independently visualized and interpreted imaging CT A/P which showed no obstruction or incarceration  Additional history obtained:   Additional history obtained from none  Previous records obtained and reviewed  In epic  Consultations Obtained:   I consulted (in the computer, no actual conversation had as they were not available at that time) cm/sw to hopefully help patient with possible low cost options for surgical consultation   A medical screening exam was performed and I feel the patient has had an appropriate workup for their chief complaint at this time and likelihood of emergent condition existing is low. They have been counseled on decision, discharge, follow up and which symptoms necessitate immediate return to the emergency department. They or their family verbally stated understanding and  agreement with plan and discharged in stable condition.   ____________________________________________  FINAL CLINICAL IMPRESSION(S) / ED DIAGNOSES  Final diagnoses:  Unilateral inguinal hernia without obstruction or gangrene, recurrence not specified    MEDICATIONS GIVEN DURING THIS VISIT:  Medications  oxyCODONE-acetaminophen (PERCOCET/ROXICET) 5-325 MG per tablet 1 tablet (1 tablet Oral Not Given 12/31/19 0432)  docusate sodium (COLACE) capsule 100 mg (100 mg Oral Not Given 12/31/19 0432)  sodium chloride flush (NS) 0.9 % injection 3 mL (3 mLs Intravenous Given 12/30/19 2352)  fentaNYL (SUBLIMAZE) injection 50 mcg (50 mcg Intravenous Given 12/30/19 2353)  lactated ringers bolus 1,000 mL (0 mLs Intravenous Stopped 12/31/19 0426)  diphenhydrAMINE (BENADRYL) injection 25 mg (25 mg Intravenous Given 12/31/19 0056)  iohexol (OMNIPAQUE) 300 MG/ML solution 100 mL (100 mLs Intravenous Contrast Given 12/31/19 0051)    NEW OUTPATIENT MEDICATIONS STARTED DURING THIS VISIT:  Discharge Medication List as of 12/31/2019  4:24 AM    START taking these medications   Details  docusate sodium (COLACE) 100 MG capsule Take 1 capsule (100 mg total) by mouth every 12 (twelve) hours., Starting Thu 12/31/2019, Print    oxyCODONE-acetaminophen (PERCOCET) 5-325 MG tablet Take 1-2 tablets by mouth every 8 (eight) hours as needed for severe pain., Starting Thu 12/31/2019, Print        Note:  This note was prepared with assistance of Dragon voice recognition software. Occasional wrong-word or sound-a-like substitutions may have occurred due to the inherent limitations of voice recognition software.   Geralynn Capri, Barbara Cower, MD 12/31/19 [phone]

## 2019-12-30 NOTE — ED Triage Notes (Signed)
States history of right groin hernia and recently pain has worsening overtime. Pain currently 9/10 sharp.

## 2019-12-31 ENCOUNTER — Emergency Department (HOSPITAL_COMMUNITY): Payer: Self-pay

## 2019-12-31 LAB — LACTIC ACID, PLASMA: Lactic Acid, Venous: 0.8 mmol/L (ref 0.5–1.9)

## 2019-12-31 MED ORDER — OXYCODONE-ACETAMINOPHEN 5-325 MG PO TABS: 1.0000 | ORAL_TABLET | Freq: Once | ORAL | Status: DC

## 2019-12-31 MED ORDER — DOCUSATE SODIUM 100 MG PO CAPS: 100.0000 mg | ORAL_CAPSULE | Freq: Once | ORAL | Status: DC

## 2019-12-31 MED ORDER — IOHEXOL 300 MG/ML  SOLN
100.0000 mL | Freq: Once | INTRAMUSCULAR | Status: AC | PRN
  Administered 2019-12-31: 100 mL via INTRAVENOUS

## 2019-12-31 MED ORDER — OXYCODONE-ACETAMINOPHEN 5-325 MG PO TABS: 1.0000 | ORAL_TABLET | Freq: Three times a day (TID) | ORAL | 0 refills | Status: DC | PRN

## 2019-12-31 MED ORDER — DIPHENHYDRAMINE HCL 50 MG/ML IJ SOLN
25.0000 mg | Freq: Once | INTRAMUSCULAR | Status: AC
  Administered 2019-12-31: 25 mg via INTRAVENOUS
  Filled 2019-12-31: qty 1

## 2019-12-31 MED ORDER — DOCUSATE SODIUM 100 MG PO CAPS
100.0000 mg | ORAL_CAPSULE | Freq: Two times a day (BID) | ORAL | 0 refills | Status: DC
Start: 2019-12-31 — End: 2023-06-18

## 2019-12-31 NOTE — ED Notes (Signed)
Patient verbalizes understanding of discharge instructions. Opportunity for questioning and answers were provided. Armband removed by staff, pt discharged from ED stable & ambulatory  

## 2020-01-07 ENCOUNTER — Ambulatory Visit (INDEPENDENT_AMBULATORY_CARE_PROVIDER_SITE_OTHER): Payer: Medicaid Other | Admitting: Primary Care

## 2020-01-12 ENCOUNTER — Ambulatory Visit (INDEPENDENT_AMBULATORY_CARE_PROVIDER_SITE_OTHER): Payer: Medicaid Other | Admitting: Primary Care

## 2020-02-11 ENCOUNTER — Other Ambulatory Visit: Payer: Self-pay | Admitting: Nurse Practitioner

## 2020-02-11 ENCOUNTER — Telehealth: Payer: Self-pay | Admitting: Nurse Practitioner

## 2020-02-11 DIAGNOSIS — I10 Essential (primary) hypertension: Secondary | ICD-10-CM

## 2020-02-11 DIAGNOSIS — E781 Pure hyperglyceridemia: Secondary | ICD-10-CM

## 2020-02-11 DIAGNOSIS — K219 Gastro-esophageal reflux disease without esophagitis: Secondary | ICD-10-CM

## 2020-02-11 MED FILL — GEMFIBROZIL 600 MG TAB: 600 | 30 days supply | Qty: 60 | Fill #0

## 2020-02-11 MED FILL — LOSARTAN POTASSIUM 50 MG TA: 50 | 30 days supply | Qty: 30 | Fill #2

## 2020-02-11 NOTE — Telephone Encounter (Signed)
Courtesy refill. Last refill 10/06/19. Made appt. For 03/23/20. Patient states he misplaced medications during recent move and has not taken medications x 1 week.

## 2020-02-11 NOTE — Telephone Encounter (Signed)
Medication: amLODipine (NORVASC) 10 MG tablet [[id number]] , gemfibrozil (LOPID) 600 MG tablet [[id number]] , losartan (COZAAR) 50 MG tablet [[id number]] , oxyCODONE-acetaminophen (PERCOCET) 5-325 MG tablet [[id number]] , rosuvastatin (CRESTOR) 20 MG tablet [[id number]]   Has the patient contacted their pharmacy? Yes  (Agent: If no, request that the patient contact the pharmacy for the refill.) (Agent: If yes, when and what did the pharmacy advise?)  Preferred Pharmacy (with phone number or street name): [facility] - [place], [place] - [address]  Phone:  [phone] Fax:  [phone]     Agent: Please be advised that RX refills may take up to 3 business days. We ask that you follow-up with your pharmacy.

## 2020-02-12 ENCOUNTER — Other Ambulatory Visit: Payer: Self-pay | Admitting: Pharmacist

## 2020-02-12 DIAGNOSIS — E781 Pure hyperglyceridemia: Secondary | ICD-10-CM

## 2020-02-12 DIAGNOSIS — I10 Essential (primary) hypertension: Secondary | ICD-10-CM

## 2020-02-12 MED FILL — AMLODIPINE BESYLATE 10 MG T: 10 | 41 days supply | Qty: 41 | Fill #0

## 2020-02-12 MED FILL — ROSUVASTATIN CALCIUM 20 MG: 20 | 30 days supply | Qty: 30 | Fill #3

## 2020-02-12 NOTE — Telephone Encounter (Signed)
Order request forwarded to [facility].

## 2020-02-13 MED ORDER — OMEPRAZOLE 20 MG PO CPDR: 20.0000 mg | DELAYED_RELEASE_CAPSULE | Freq: Every day | ORAL | 0 refills | Status: DC

## 2020-02-13 MED ORDER — ROSUVASTATIN CALCIUM 20 MG PO TABS: 20.0000 mg | ORAL_TABLET | Freq: Every day | ORAL | 0 refills | Status: DC

## 2020-02-13 MED ORDER — LOSARTAN POTASSIUM 50 MG PO TABS: 50.0000 mg | ORAL_TABLET | Freq: Every day | ORAL | 0 refills | Status: DC

## 2020-02-13 NOTE — Addendum Note (Signed)
Addended byHoy Register on: 02/13/2020 08:07 PM   Modules accepted: Orders

## 2020-02-13 NOTE — Telephone Encounter (Signed)
30 day supply provided. Needs to schedule appointment with PCP and discuss Opioid needs then.

## 2020-02-15 MED FILL — OMEPRAZOLE 20 MG CAP: 20 | 30 days supply | Qty: 30 | Fill #0

## 2020-03-23 ENCOUNTER — Ambulatory Visit: Payer: Medicaid Other | Admitting: Nurse Practitioner

## 2020-06-07 ENCOUNTER — Other Ambulatory Visit: Payer: Self-pay | Admitting: Nurse Practitioner

## 2020-06-07 DIAGNOSIS — I10 Essential (primary) hypertension: Secondary | ICD-10-CM

## 2020-06-07 MED ORDER — ROSUVASTATIN CALCIUM 20 MG PO TABS: 20.0000 mg | ORAL_TABLET | Freq: Every day | ORAL | 0 refills | Status: DC

## 2020-06-07 MED ORDER — LOSARTAN POTASSIUM 50 MG PO TABS: 50.0000 mg | ORAL_TABLET | Freq: Every day | ORAL | 0 refills | Status: DC

## 2020-06-07 MED FILL — LOSARTAN POTASSIUM 50 MG TA: 50 | 30 days supply | Qty: 30 | Fill #0

## 2020-06-07 MED FILL — ROSUVASTATIN CALCIUM 20 MG: 20 | 30 days supply | Qty: 30 | Fill #0

## 2020-06-07 NOTE — Telephone Encounter (Signed)
Medication Refill - Medication: rosuvastatin (CRESTOR) 20 MG tablet losartan (COZAAR) 50 MG tablet     Preferred Pharmacy (with phone number or street name):  [facility] - [place], [place] - [place] [address] Phone:  [phone]  Fax:  [phone]       Agent: Please be advised that RX refills may take up to 3 business days. We ask that you follow-up with your pharmacy.

## 2020-06-21 MED FILL — ROSUVASTATIN CALCIUM 20 MG: 20 | 30 days supply | Qty: 30 | Fill #0

## 2020-06-21 MED FILL — LOSARTAN POTASSIUM 50 MG TA: 50 | 30 days supply | Qty: 30 | Fill #0

## 2020-07-27 ENCOUNTER — Other Ambulatory Visit: Payer: Self-pay

## 2020-07-27 ENCOUNTER — Other Ambulatory Visit: Payer: Self-pay | Admitting: Nurse Practitioner

## 2020-07-27 ENCOUNTER — Encounter: Payer: Self-pay | Admitting: Nurse Practitioner

## 2020-07-27 ENCOUNTER — Ambulatory Visit: Payer: Medicaid Other | Attending: Nurse Practitioner | Admitting: Nurse Practitioner

## 2020-07-27 VITALS — BP 128/75 | HR 94 | Temp 98.8°F | Ht 66.0 in | Wt 198.0 lb

## 2020-07-27 DIAGNOSIS — E785 Hyperlipidemia, unspecified: Secondary | ICD-10-CM

## 2020-07-27 DIAGNOSIS — E781 Pure hyperglyceridemia: Secondary | ICD-10-CM

## 2020-07-27 DIAGNOSIS — E1165 Type 2 diabetes mellitus with hyperglycemia: Secondary | ICD-10-CM

## 2020-07-27 DIAGNOSIS — R7303 Prediabetes: Secondary | ICD-10-CM

## 2020-07-27 DIAGNOSIS — I10 Essential (primary) hypertension: Secondary | ICD-10-CM

## 2020-07-27 DIAGNOSIS — D649 Anemia, unspecified: Secondary | ICD-10-CM

## 2020-07-27 DIAGNOSIS — Z125 Encounter for screening for malignant neoplasm of prostate: Secondary | ICD-10-CM

## 2020-07-27 DIAGNOSIS — Z1211 Encounter for screening for malignant neoplasm of colon: Secondary | ICD-10-CM

## 2020-07-27 DIAGNOSIS — K4090 Unilateral inguinal hernia, without obstruction or gangrene, not specified as recurrent: Secondary | ICD-10-CM

## 2020-07-27 LAB — POCT GLYCOSYLATED HEMOGLOBIN (HGB A1C): Hemoglobin A1C: 5.6 % (ref 4.0–5.6)

## 2020-07-27 LAB — GLUCOSE, POCT (MANUAL RESULT ENTRY): POC Glucose: 139 mg/dl — AB (ref 70–99)

## 2020-07-27 MED ORDER — GEMFIBROZIL 600 MG PO TABS: ORAL_TABLET | ORAL | 3 refills | Status: DC

## 2020-07-27 MED ORDER — ROSUVASTATIN CALCIUM 20 MG PO TABS: 20.0000 mg | ORAL_TABLET | Freq: Every day | ORAL | 0 refills | Status: DC

## 2020-07-27 MED ORDER — ASPIRIN 81 MG PO TBEC: 81.0000 mg | DELAYED_RELEASE_TABLET | Freq: Every day | ORAL | 4 refills | Status: DC

## 2020-07-27 MED ORDER — AMLODIPINE BESYLATE 10 MG PO TABS: 10.0000 mg | ORAL_TABLET | Freq: Every day | ORAL | 0 refills | Status: DC

## 2020-07-27 MED ORDER — LOSARTAN POTASSIUM 50 MG PO TABS: 50.0000 mg | ORAL_TABLET | Freq: Every day | ORAL | 0 refills | Status: DC

## 2020-07-27 MED ORDER — TRAMADOL HCL 50 MG PO TABS: 50.0000 mg | ORAL_TABLET | Freq: Three times a day (TID) | ORAL | 0 refills | Status: DC | PRN

## 2020-07-27 MED FILL — LOSARTAN POTASSIUM 50 MG TA: 50 | 30 days supply | Qty: 30 | Fill #0

## 2020-07-27 MED FILL — AMLODIPINE BESYLATE 10 MG T: 10 | 41 days supply | Qty: 41 | Fill #0

## 2020-07-27 MED FILL — traMADol HCL 50 MG TABS: 50 | 5 days supply | Qty: 15 | Fill #0

## 2020-07-27 MED FILL — ROSUVASTATIN CALCIUM 20 MG: 20 | 30 days supply | Qty: 30 | Fill #0

## 2020-07-27 MED FILL — GEMFIBROZIL 600 MG TAB: 600 | 30 days supply | Qty: 60 | Fill #0

## 2020-07-27 NOTE — Progress Notes (Signed)
Assessment & Plan:  Victor [name] was seen today for medication refill.  Diagnoses and all orders for this visit:  Essential hypertension -     CMP14+EGFR -     amLODipine (NORVASC) 10 MG tablet; Take 1 tablet (10 mg total) by mouth daily. -     losartan (COZAAR) 50 MG tablet; Take 1 tablet (50 mg total) by mouth daily. -     aspirin 81 MG EC tablet; Take 1 tablet (81 mg total) by mouth daily. Continue all antihypertensives as prescribed.  Remember to bring in your blood pressure log with you for your follow up appointment.  DASH/Mediterranean Diets are healthier choices for HTN.    Type 2 diabetes mellitus with hyperglycemia, without long-term current use of insulin (HCC) -     Glucose (CBG) -     HgB A1c -     Ambulatory referral to Ophthalmology -     Cancel: Microalbumin/Creatinine Ratio, Urine Continue blood sugar control as discussed in office today, low carbohydrate diet, and regular physical exercise as tolerated, 150 minutes per week (30 min each day, 5 days per week, or 50 min 3 days per week). Keep blood sugar logs with fasting goal of 90-130 mg/dl, post prandial (after you eat) less than 180.  For Hypoglycemia: BS <60 and Hyperglycemia BS >400; contact the clinic ASAP. Annual eye exams and foot exams are recommended.   Anemia, unspecified type -     CBC  Dyslipidemia, goal LDL below 70 -     Lipid panel -     rosuvastatin (CRESTOR) 20 MG tablet; Take 1 tablet (20 mg total) by mouth daily. INSTRUCTIONS: Work on a low fat, heart healthy diet and participate in regular aerobic exercise program by working out at least 150 minutes per week; 5 days a week-30 minutes per day. Avoid red meat/beef/steak,  fried foods. junk foods, sodas, sugary drinks, unhealthy snacking, alcohol and smoking.  Drink at least 80 oz of water per day and monitor your carbohydrate intake daily.    Hypertriglyceridemia -     gemfibrozil (LOPID) 600 MG tablet; TAKE 1 TABLET BY MOUTH 2 TIMES DAILY BEFORE A  MEAL. INSTRUCTIONS: Work on a low fat, heart healthy diet and participate in regular aerobic exercise program by working out at least 150 minutes per week; 5 days a week-30 minutes per day. Avoid red meat/beef/steak,  fried foods. junk foods, sodas, sugary drinks, unhealthy snacking, alcohol and smoking.  Drink at least 80 oz of water per day and monitor your carbohydrate intake daily.    Prostate cancer screening -     PSA  Right inguinal hernia -     traMADol (ULTRAM) 50 MG tablet; Take 1 tablet (50 mg total) by mouth every 8 (eight) hours as needed for up to 5 days for severe pain.  Colon cancer screening -     Fecal occult blood, imunochemical([facility])    Patient has been counseled on age-appropriate routine health concerns for screening and prevention. These are reviewed and up-to-date. Referrals have been placed accordingly. Immunizations are up-to-date or declined.    Subjective:   Chief Complaint  Patient presents with  . Medication Refill    Pt. Is here for medication refill.    HPI Victor [name] 50 y.o. male presents to office today for follow up.  has a past medical history of Dizziness (10/15/2013), Hypertension, Hypertriglyceridemia, and Obesity.  Lab Results  Component Value Date   HGBA1C 5.6 07/27/2020   Lab Results  Component  Value Date   HGBA1C 5.4 01/09/2019   Lab Results  Component Value Date   LDLCALC 62 04/16/2019     Essential Hypertension Blood pressure is well controlled. Taking amlodipine 10 mg and losartan 100 mg daily. Denies chest pain, shortness of breath, palpitations, lightheadedness, dizziness, headaches or BLE edema.  BP Readings from Last 3 Encounters:  07/27/20 128/75  12/31/19 (!) 157/84  10/26/19 (!) 150/75    Right groin Review of Systems  Constitutional: Negative for fever, malaise/fatigue and weight loss.  HENT: Negative.  Negative for nosebleeds.   Eyes: Negative.  Negative for blurred vision, double vision and  photophobia.  Respiratory: Negative.  Negative for cough and shortness of breath.   Cardiovascular: Negative.  Negative for chest pain, palpitations and leg swelling.  Gastrointestinal: Negative.  Negative for heartburn, nausea and vomiting.  Genitourinary:       RIGHT INGUINAL HERNIA tenderness  Musculoskeletal: Negative.  Negative for myalgias.  Skin: Negative.   Neurological: Negative.  Negative for dizziness, focal weakness, seizures and headaches.  Endo/Heme/Allergies: Negative.   Psychiatric/Behavioral: Negative.  Negative for suicidal ideas.    Past Medical History:  Diagnosis Date  . Dizziness 10/15/2013    "SWIMMY HEAD    "  . Hypertension   . Hypertriglyceridemia   . Obesity     Past Surgical History:  Procedure Laterality Date  . NO PAST SURGERIES      Family History  Problem Relation Age of Onset  . CAD Mother        stenting @ 36; alive @ 85  . Aneurysm Mother        thoracic s/p surgery @ 53  . Lung disease Mother   . Other Father        unknown    Social History Reviewed with no changes to be made today.   Outpatient Medications Prior to Visit  Medication Sig Dispense Refill  . docusate sodium (COLACE) 100 MG capsule Take 1 capsule (100 mg total) by mouth every 12 (twelve) hours. 60 capsule 0  . amLODipine (NORVASC) 10 MG tablet TAKE 1 TABLET BY MOUTH DAILY. 41 tablet 0  . aspirin 81 MG EC tablet Take 1 tablet (81 mg total) by mouth daily. 30 tablet 4  . gemfibrozil (LOPID) 600 MG tablet TAKE 1 TABLET BY MOUTH 2 TIMES DAILY BEFORE A MEAL. Patient will pick up scripts Monday 01-12-2019 (Patient taking differently: Take 600 mg by mouth 2 (two) times daily before a meal.) 60 tablet 3  . losartan (COZAAR) 50 MG tablet Take 1 tablet (50 mg total) by mouth daily. 60 tablet 0  . oxyCODONE-acetaminophen (PERCOCET) 5-325 MG tablet Take 1-2 tablets by mouth every 8 (eight) hours as needed for severe pain. 20 tablet 0  . rosuvastatin (CRESTOR) 20 MG tablet Take 1  tablet (20 mg total) by mouth daily. 60 tablet 0  . omeprazole (PRILOSEC) 20 MG capsule Take 1 capsule (20 mg total) by mouth daily. 30 capsule 0  . ranitidine (ZANTAC) 150 MG tablet TAKE 1 TABLET BY MOUTH 2 TIMES DAILY. (Patient not taking: No sig reported) 60 tablet 1   No facility-administered medications prior to visit.    No Known Allergies     Objective:    BP 128/75 (BP Location: Left Arm, Patient Position: Sitting, Cuff Size: Normal)   Pulse 94   Temp 98.8 F (37.1 C) (Oral)   Ht '5\' 6"'  (1.676 m)   Wt 198 lb (89.8 kg)   SpO2 98%  BMI 31.96 kg/m  Wt Readings from Last 3 Encounters:  07/27/20 198 lb (89.8 kg)  12/30/19 205 lb (93 kg)  10/26/19 205 lb (93 kg)    Physical Exam Constitutional:      Appearance: He is well-developed and well-nourished.  HENT:     Head: Normocephalic and atraumatic.     Right Ear: Hearing, tympanic membrane, ear canal and external ear normal.     Left Ear: Hearing, tympanic membrane, ear canal and external ear normal.     Nose: Nose normal. No mucosal edema or rhinorrhea.     Mouth/Throat:     Mouth: Oropharynx is clear and moist and mucous membranes are normal.     Pharynx: Uvula midline.     Tonsils: No tonsillar exudate. 1+ on the right. 1+ on the left.  Eyes:     General: Lids are normal. No scleral icterus.    Extraocular Movements: EOM normal.     Conjunctiva/sclera: Conjunctivae normal.     Pupils: Pupils are equal, round, and reactive to light.  Neck:     Thyroid: No thyromegaly.     Trachea: No tracheal deviation.  Cardiovascular:     Rate and Rhythm: Normal rate and regular rhythm.     Pulses: Intact distal pulses.     Heart sounds: Murmur heard.  No friction rub. No gallop.   Pulmonary:     Effort: Pulmonary effort is normal. No respiratory distress.     Breath sounds: Normal breath sounds. No wheezing or rales.  Chest:     Chest wall: No mass or tenderness.  Breasts:     Right: No inverted nipple, mass, nipple  discharge, skin change or tenderness.     Left: No inverted nipple, mass, nipple discharge, skin change or tenderness.    Abdominal:     General: Bowel sounds are normal. There is no distension.     Palpations: Abdomen is soft. There is no mass.     Tenderness: There is no abdominal tenderness. There is no right CVA tenderness, left CVA tenderness, guarding or rebound.     Hernia: A hernia (right inguinal) is present. There is no hernia in the right inguinal area.     Comments: RIGHT INGUINAL HERNIA  Genitourinary:    Penis: Normal.      Testes: Normal.        Right: Mass, tenderness or swelling not present. Right testis is descended. Cremasteric reflex is present.         Left: Mass, tenderness or swelling not present. Left testis is descended. Cremasteric reflex is present.     Musculoskeletal:        General: No tenderness, deformity or edema. Normal range of motion.     Cervical back: Normal range of motion and neck supple.  Lymphadenopathy:     Cervical: No cervical adenopathy.     Lower Body: No right inguinal and no right inguinal adenopathy. No left inguinal and no left inguinal adenopathy.  Skin:    General: Skin is warm and dry.     Capillary Refill: Capillary refill takes less than 2 seconds.     Findings: No erythema.  Neurological:     Mental Status: He is alert and oriented to person, place, and time.     Cranial Nerves: No cranial nerve deficit.     Motor: No abnormal muscle tone.     Coordination: Coordination normal.     Deep Tendon Reflexes: Reflexes normal.  Psychiatric:  Mood and Affect: Mood and affect, mood and affect normal.        Speech: Speech normal.        Behavior: Behavior normal.        Thought Content: Thought content normal.        Cognition and Memory: Cognition and memory normal.        Judgment: Judgment normal.          Patient has been counseled extensively about nutrition and exercise as well as the importance of adherence  with medications and regular follow-up. The patient was given clear instructions to go to ER or return to medical center if symptoms don't improve, worsen or new problems develop. The patient verbalized understanding.   Follow-up: Return in about 3 months (around 10/25/2020) for NEEDS ORANGE CARD/CAFA APP.   [name], FNP-BC [facility] [place], [place]   07/27/2020, 2:34 PM

## 2020-07-28 LAB — CMP14+EGFR
ALT: 93 IU/L — ABNORMAL HIGH (ref 0–44)
AST: 93 IU/L — ABNORMAL HIGH (ref 0–40)
Albumin/Globulin Ratio: 1.2 (ref 1.2–2.2)
Albumin: 4.3 g/dL (ref 3.8–4.9)
Alkaline Phosphatase: 71 IU/L (ref 44–121)
BUN/Creatinine Ratio: 12 (ref 9–20)
BUN: 11 mg/dL (ref 6–24)
Bilirubin Total: 0.3 mg/dL (ref 0.0–1.2)
CO2: 18 mmol/L — ABNORMAL LOW (ref 20–29)
Calcium: 9.5 mg/dL (ref 8.7–10.2)
Chloride: 105 mmol/L (ref 96–106)
Creatinine, Ser: 0.94 mg/dL (ref 0.76–1.27)
GFR calc Af Amer: 108 mL/min/{1.73_m2} (ref >59)
GFR calc non Af Amer: 93 mL/min/{1.73_m2} (ref >59)
Globulin, Total: 3.5 g/dL (ref 1.5–4.5)
Glucose: 100 mg/dL — ABNORMAL HIGH (ref 65–99)
Potassium: 4.2 mmol/L (ref 3.5–5.2)
Sodium: 140 mmol/L (ref 134–144)
Total Protein: 7.8 g/dL (ref 6.0–8.5)

## 2020-07-28 LAB — CBC
Hematocrit: 35.8 % — ABNORMAL LOW (ref 37.5–51.0)
Hemoglobin: 11.6 g/dL — ABNORMAL LOW (ref 13.0–17.7)
MCH: 28.4 pg (ref 26.6–33.0)
MCHC: 32.4 g/dL (ref 31.5–35.7)
MCV: 88 fL (ref 79–97)
Platelets: 177 10*3/uL (ref 150–450)
RBC: 4.08 x10E6/uL — ABNORMAL LOW (ref 4.14–5.80)
RDW: 15.2 % (ref 11.6–15.4)
WBC: 7.4 10*3/uL (ref 3.4–10.8)

## 2020-07-28 LAB — LIPID PANEL
Chol/HDL Ratio: 3 ratio (ref 0.0–5.0)
Cholesterol, Total: 146 mg/dL (ref 100–199)
HDL: 48 mg/dL (ref >39)
LDL Chol Calc (NIH): 73 mg/dL (ref 0–99)
Triglycerides: 146 mg/dL (ref 0–149)
VLDL Cholesterol Cal: 25 mg/dL (ref 5–40)

## 2020-07-28 LAB — PSA: Prostate Specific Ag, Serum: 0.3 ng/mL (ref 0.0–4.0)

## 2020-10-25 ENCOUNTER — Telehealth: Payer: Self-pay | Admitting: Nurse Practitioner

## 2020-10-25 ENCOUNTER — Other Ambulatory Visit: Payer: Self-pay

## 2020-10-25 ENCOUNTER — Ambulatory Visit: Payer: Self-pay | Attending: Nurse Practitioner | Admitting: Nurse Practitioner

## 2020-10-25 ENCOUNTER — Encounter: Payer: Self-pay | Admitting: Nurse Practitioner

## 2020-10-25 DIAGNOSIS — I10 Essential (primary) hypertension: Secondary | ICD-10-CM

## 2020-10-25 DIAGNOSIS — E781 Pure hyperglyceridemia: Secondary | ICD-10-CM

## 2020-10-25 DIAGNOSIS — Z1159 Encounter for screening for other viral diseases: Secondary | ICD-10-CM

## 2020-10-25 DIAGNOSIS — D649 Anemia, unspecified: Secondary | ICD-10-CM

## 2020-10-25 NOTE — Progress Notes (Signed)
Virtual Visit via Telephone Note Due to national recommendations of social distancing due to [place] 19, telehealth visit is felt to be most appropriate for this patient at this time.  I discussed the limitations, risks, security and privacy concerns of performing an evaluation and management service by telephone and the availability of in person appointments. I also discussed with the patient that there may be a patient responsible charge related to this service. The patient expressed understanding and agreed to proceed.    I connected with Victor [name] on 10/25/20  at  11:10 AM EDT  EDT by telephone and verified that I am speaking with the correct person using two identifiers.   Consent I discussed the limitations, risks, security and privacy concerns of performing an evaluation and management service by telephone and the availability of in person appointments. I also discussed with the patient that there may be a patient responsible charge related to this service. The patient expressed understanding and agreed to proceed.   Location of Patient: Private Residence    Location of Provider: [place] and [place] participating in Telemedicine visit: [name] FNP-BC [place]    History of Present Illness: Telemedicine visit for: Follow up He has a past medical history of Dizziness (10/15/2013), Hypertension, Hypertriglyceridemia, and Obesity.   Essential Hypertension He does not monitor your blood pressure at home however has been well controlled here in the office.  He endorses medication adherence taking losartan 50 mg daily and amlodipine 10 mg daily. Denies chest pain, shortness of breath, palpitations, lightheadedness, dizziness, headaches or BLE edema.  BP Readings from Last 3 Encounters:  07/27/20 128/75  12/31/19 (!) 157/84  10/26/19 (!) 150/75    Lipid levels have improved with gemfibrozil 600 mg twice daily and Crestor 20 mg  daily. Lab Results  Component Value Date   CHOL 146 07/27/2020   CHOL 169 04/16/2019   CHOL 222 (H) 12/11/2018   Lab Results  Component Value Date   HDL 48 07/27/2020   HDL 52 04/16/2019   HDL 33 (L) 12/11/2018   Lab Results  Component Value Date   LDLCALC 73 07/27/2020   LDLCALC 62 04/16/2019   LDLCALC Comment 12/11/2018   Lab Results  Component Value Date   TRIG 146 07/27/2020   TRIG 355 (H) 04/16/2019   TRIG 825 (HH) 12/11/2018   Lab Results  Component Value Date   CHOLHDL 3.0 07/27/2020   CHOLHDL 3.3 04/16/2019   CHOLHDL 6.7 (H) 12/11/2018     Past Medical History:  Diagnosis Date  . Dizziness 10/15/2013    "SWIMMY HEAD    "  . Hypertension   . Hypertriglyceridemia   . Obesity     Past Surgical History:  Procedure Laterality Date  . NO PAST SURGERIES      Family History  Problem Relation Age of Onset  . CAD Mother        stenting @ 69; alive @ 58  . Aneurysm Mother        thoracic s/p surgery @ 22  . Lung disease Mother   . Other Father        unknown    Social History   Socioeconomic History  . Marital status: Single    Spouse name: Not on file  . Number of children: Not on file  . Years of education: Not on file  . Highest education level: Not on file  Occupational History  . Not on  file  Tobacco Use  . Smoking status: Never Smoker  . Smokeless tobacco: Current User    Types: Chew  Vaping Use  . Vaping Use: Never used  Substance and Sexual Activity  . Alcohol use: Yes    Comment: 4 12 oz beers twice/wk.  . Drug use: No  . Sexual activity: Yes  Other Topics Concern  . Not on file  Social History Narrative   Lives in [place].  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   Social Determinants of Health   Financial Resource Strain: Not on file  Food Insecurity: Not on file  Transportation Needs: Not on file  Physical Activity: Not on file  Stress: Not on file  Social Connections: Not on file      Observations/Objective: Awake, alert and oriented x 3   Review of Systems  Constitutional: Negative for fever, malaise/fatigue and weight loss.  HENT: Negative.  Negative for nosebleeds.   Eyes: Negative.  Negative for blurred vision, double vision and photophobia.  Respiratory: Negative.  Negative for cough and shortness of breath.   Cardiovascular: Negative.  Negative for chest pain, palpitations and leg swelling.  Gastrointestinal: Negative.  Negative for heartburn, nausea and vomiting.  Musculoskeletal: Negative.  Negative for myalgias.  Neurological: Negative.  Negative for dizziness, focal weakness, seizures and headaches.  Psychiatric/Behavioral: Negative.  Negative for suicidal ideas.    Assessment and Plan: Diagnoses and all orders for this visit:  Primary hypertension -     CMP14+EGFR; Future Continue all antihypertensives as prescribed.  Remember to bring in your blood pressure log with you for your follow up appointment.  DASH/Mediterranean Diets are healthier choices for HTN.    Hypertriglyceridemia INSTRUCTIONS: Work on a low fat, heart healthy diet and participate in regular aerobic exercise program by working out at least 150 minutes per week; 5 days a week-30 minutes per day. Avoid red meat/beef/steak,  fried foods. junk foods, sodas, sugary drinks, unhealthy snacking, alcohol and smoking.  Drink at least 80 oz of water per day and monitor your carbohydrate intake daily.    Need for hepatitis C screening test -     HCV Ab w Reflex to Quant PCR; Future  Anemia, unspecified type -     CBC; Future     Follow Up Instructions Return in about 3 months (around 01/24/2021).     I discussed the assessment and treatment plan with the patient. The patient was provided an opportunity to ask questions and all were answered. The patient agreed with the plan and demonstrated an understanding of the instructions.   The patient was advised to call back or seek an  in-person evaluation if the symptoms worsen or if the condition fails to improve as anticipated.  I provided 11 minutes of non-face-to-face time during this encounter including median intraservice time, reviewing previous notes, labs, imaging, medications and explaining diagnosis and management.  [name], FNP-BC

## 2020-10-25 NOTE — Telephone Encounter (Signed)
No answer. LVM to return call to office 

## 2020-12-23 ENCOUNTER — Ambulatory Visit: Payer: Medicaid Other | Admitting: Nurse Practitioner

## 2021-02-02 ENCOUNTER — Other Ambulatory Visit: Payer: Self-pay

## 2021-02-02 ENCOUNTER — Observation Stay (HOSPITAL_COMMUNITY)
Admission: EM | Admit: 2021-02-02 | Discharge: 2021-02-04 | Disposition: A | Discharge status: Home / Self Care | Payer: Medicaid Other | Attending: Surgery | Admitting: Surgery

## 2021-02-02 DIAGNOSIS — I10 Essential (primary) hypertension: Secondary | ICD-10-CM | POA: Insufficient documentation

## 2021-02-02 DIAGNOSIS — K4090 Unilateral inguinal hernia, without obstruction or gangrene, not specified as recurrent: Secondary | ICD-10-CM

## 2021-02-02 DIAGNOSIS — Z7982 Long term (current) use of aspirin: Secondary | ICD-10-CM | POA: Insufficient documentation

## 2021-02-02 DIAGNOSIS — K4000 Bilateral inguinal hernia, with obstruction, without gangrene, not specified as recurrent: Principal | ICD-10-CM | POA: Insufficient documentation

## 2021-02-02 DIAGNOSIS — Z8719 Personal history of other diseases of the digestive system: Secondary | ICD-10-CM

## 2021-02-02 DIAGNOSIS — Z79899 Other long term (current) drug therapy: Secondary | ICD-10-CM | POA: Insufficient documentation

## 2021-02-02 DIAGNOSIS — K460 Unspecified abdominal hernia with obstruction, without gangrene: Secondary | ICD-10-CM

## 2021-02-02 DIAGNOSIS — Z20822 Contact with and (suspected) exposure to covid-19: Secondary | ICD-10-CM | POA: Insufficient documentation

## 2021-02-02 LAB — CBC WITH DIFFERENTIAL/PLATELET
Abs Immature Granulocytes: 0.02 10*3/uL (ref 0.00–0.07)
Basophils Absolute: 0 10*3/uL (ref 0.0–0.1)
Basophils Relative: 0 %
Eosinophils Absolute: 0.1 10*3/uL (ref 0.0–0.5)
Eosinophils Relative: 1 %
HCT: 41.6 % (ref 39.0–52.0)
Hemoglobin: 13.7 g/dL (ref 13.0–17.0)
Immature Granulocytes: 0 %
Lymphocytes Relative: 18 %
Lymphs Abs: 1.2 10*3/uL (ref 0.7–4.0)
MCH: 31.1 pg (ref 26.0–34.0)
MCHC: 32.9 g/dL (ref 30.0–36.0)
MCV: 94.3 fL (ref 80.0–100.0)
Monocytes Absolute: 0.6 10*3/uL (ref 0.1–1.0)
Monocytes Relative: 9 %
Neutro Abs: 5.1 10*3/uL (ref 1.7–7.7)
Neutrophils Relative %: 72 %
Platelets: 100 10*3/uL — ABNORMAL LOW (ref 150–400)
RBC: 4.41 MIL/uL (ref 4.22–5.81)
RDW: 18.6 % — ABNORMAL HIGH (ref 11.5–15.5)
WBC: 7 10*3/uL (ref 4.0–10.5)
nRBC: 0 % (ref 0.0–0.2)

## 2021-02-02 LAB — LIPASE, BLOOD: Lipase: 32 U/L (ref 11–51)

## 2021-02-02 LAB — COMPREHENSIVE METABOLIC PANEL
ALT: 46 U/L — ABNORMAL HIGH (ref 0–44)
AST: 75 U/L — ABNORMAL HIGH (ref 15–41)
Albumin: 3.9 g/dL (ref 3.5–5.0)
Alkaline Phosphatase: 52 U/L (ref 38–126)
Anion gap: 9 (ref 5–15)
BUN: 6 mg/dL (ref 6–20)
CO2: 21 mmol/L — ABNORMAL LOW (ref 22–32)
Calcium: 9.2 mg/dL (ref 8.9–10.3)
Chloride: 103 mmol/L (ref 98–111)
Creatinine, Ser: 0.85 mg/dL (ref 0.61–1.24)
GFR, Estimated: >60 mL/min (ref >60)
Glucose, Bld: 123 mg/dL — ABNORMAL HIGH (ref 70–99)
Potassium: 3.7 mmol/L (ref 3.5–5.1)
Sodium: 133 mmol/L — ABNORMAL LOW (ref 135–145)
Total Bilirubin: 1.5 mg/dL — ABNORMAL HIGH (ref 0.3–1.2)
Total Protein: 7.4 g/dL (ref 6.5–8.1)

## 2021-02-02 NOTE — ED Provider Notes (Signed)
Emergency Medicine Provider Triage Evaluation Note  Victor [name] , a 50 y.o. male  was evaluated in triage.  Pt complains of right inguinal pain surrounding his hernia after moving a refrigerator 3 days ago. Patient admits to having a right inguinal hernia for over a year with no issues; however, over the past 3 days he has noticed increased pain to area. Hernia goes into right scrotal sac. No nausea, vomiting, or diarrhea. No fever or chills. Unable to reduce hernia.  Review of Systems  Positive: Abdominal pain Negative: fever  Physical Exam  BP (!) 173/77 (BP Location: Left Arm)   Pulse 94   Temp 98.1 F (36.7 C) (Oral)   Resp 16   SpO2 97%  Gen:   Awake, no distress   Resp:  Normal effort  MSK:   Moves extremities without difficulty  Other:  Hernia to right inguinal region into right scrotal sac. Unable to reduce.  Medical Decision Making  Medically screening exam initiated at 9:16 PM.  Appropriate orders placed.  Franklyn Cafaro was informed that the remainder of the evaluation will be completed by another provider, this initial triage assessment does not replace that evaluation, and the importance of remaining in the ED until their evaluation is complete.  Concern for incarcerated vs strangulated hernia. Labs ordered. CT abdomen. Charge RN informed.    Jesusita Oka 02/02/21 2122    Zadie Rhine, MD 02/03/21 [phone]

## 2021-02-02 NOTE — ED Triage Notes (Signed)
Pt has pre existing inguinal hernia that has worsened since Sunday when he moved a refrigerator. Swelling increased with pain across abdomen and around to back.  No n/v/d

## 2021-02-03 ENCOUNTER — Emergency Department (HOSPITAL_COMMUNITY): Payer: Medicaid Other | Admitting: Anesthesiology

## 2021-02-03 ENCOUNTER — Emergency Department (HOSPITAL_COMMUNITY): Payer: Medicaid Other

## 2021-02-03 ENCOUNTER — Encounter (HOSPITAL_COMMUNITY): Admission: EM | Disposition: A | Discharge status: Home / Self Care | Payer: Self-pay | Source: Home / Self Care

## 2021-02-03 DIAGNOSIS — Z8719 Personal history of other diseases of the digestive system: Secondary | ICD-10-CM

## 2021-02-03 DIAGNOSIS — K4090 Unilateral inguinal hernia, without obstruction or gangrene, not specified as recurrent: Secondary | ICD-10-CM

## 2021-02-03 HISTORY — PX: INSERTION OF MESH: SHX5868

## 2021-02-03 HISTORY — PX: INGUINAL HERNIA REPAIR: SHX194

## 2021-02-03 LAB — HIV ANTIBODY (ROUTINE TESTING W REFLEX): HIV Screen 4th Generation wRfx: NONREACTIVE

## 2021-02-03 LAB — RESP PANEL BY RT-PCR (FLU A&B, COVID) ARPGX2
Influenza A by PCR: NEGATIVE
Influenza B by PCR: NEGATIVE
SARS Coronavirus 2 by RT PCR: NEGATIVE

## 2021-02-03 SURGERY — REPAIR, HERNIA, INGUINAL, ADULT
Anesthesia: General | Site: Inguinal | Laterality: Right

## 2021-02-03 MED ORDER — ONDANSETRON HCL 4 MG/2ML IJ SOLN
INTRAMUSCULAR | Status: DC | PRN
  Administered 2021-02-03: 4 mg via INTRAVENOUS

## 2021-02-03 MED ORDER — MIDAZOLAM HCL 2 MG/2ML IJ SOLN
INTRAMUSCULAR | Status: AC
  Filled 2021-02-03: qty 2

## 2021-02-03 MED ORDER — MIDAZOLAM HCL 2 MG/2ML IJ SOLN
INTRAMUSCULAR | Status: DC | PRN
  Administered 2021-02-03: 2 mg via INTRAVENOUS

## 2021-02-03 MED ORDER — ROCURONIUM BROMIDE 100 MG/10ML IV SOLN
INTRAVENOUS | Status: DC | PRN
  Administered 2021-02-03: 50 mg via INTRAVENOUS
  Administered 2021-02-03: 20 mg via INTRAVENOUS

## 2021-02-03 MED ORDER — DEXAMETHASONE SODIUM PHOSPHATE 10 MG/ML IJ SOLN
INTRAMUSCULAR | Status: AC
  Filled 2021-02-03: qty 1

## 2021-02-03 MED ORDER — FENTANYL CITRATE (PF) 100 MCG/2ML IJ SOLN
INTRAMUSCULAR | Status: AC
  Filled 2021-02-03: qty 2

## 2021-02-03 MED ORDER — ENOXAPARIN SODIUM 40 MG/0.4ML IJ SOSY
40.0000 mg | PREFILLED_SYRINGE | INTRAMUSCULAR | Status: DC
  Administered 2021-02-04: 40 mg via SUBCUTANEOUS
  Filled 2021-02-03: qty 0.4

## 2021-02-03 MED ORDER — SUCCINYLCHOLINE CHLORIDE 20 MG/ML IJ SOLN
INTRAMUSCULAR | Status: DC | PRN
  Administered 2021-02-03: 100 mg via INTRAVENOUS

## 2021-02-03 MED ORDER — LIDOCAINE HCL (CARDIAC) PF 100 MG/5ML IV SOSY
PREFILLED_SYRINGE | INTRAVENOUS | Status: DC | PRN
  Administered 2021-02-03: 80 mg via INTRAVENOUS

## 2021-02-03 MED ORDER — ONDANSETRON HCL 4 MG/2ML IJ SOLN: 4.0000 mg | Freq: Four times a day (QID) | INTRAMUSCULAR | Status: DC | PRN

## 2021-02-03 MED ORDER — CEFAZOLIN SODIUM-DEXTROSE 2-3 GM-%(50ML) IV SOLR
INTRAVENOUS | Status: DC | PRN
  Administered 2021-02-03: 2 g via INTRAVENOUS

## 2021-02-03 MED ORDER — LACTATED RINGERS IV SOLN: INTRAVENOUS | Status: DC

## 2021-02-03 MED ORDER — ACETAMINOPHEN 10 MG/ML IV SOLN: 1000.0000 mg | Freq: Once | INTRAVENOUS | Status: DC | PRN

## 2021-02-03 MED ORDER — PROPOFOL 10 MG/ML IV BOLUS
INTRAVENOUS | Status: AC
  Filled 2021-02-03: qty 20

## 2021-02-03 MED ORDER — PROPOFOL 10 MG/ML IV BOLUS
INTRAVENOUS | Status: DC | PRN
  Administered 2021-02-03: 170 mg via INTRAVENOUS

## 2021-02-03 MED ORDER — IOHEXOL 300 MG/ML  SOLN
100.0000 mL | Freq: Once | INTRAMUSCULAR | Status: AC | PRN
  Administered 2021-02-03: 100 mL via INTRAVENOUS

## 2021-02-03 MED ORDER — LACTATED RINGERS IV SOLN: INTRAVENOUS | Status: DC | PRN

## 2021-02-03 MED ORDER — FENTANYL CITRATE (PF) 100 MCG/2ML IJ SOLN
INTRAMUSCULAR | Status: DC | PRN
  Administered 2021-02-03: 100 ug via INTRAVENOUS

## 2021-02-03 MED ORDER — ONDANSETRON 4 MG PO TBDP: 4.0000 mg | ORAL_TABLET | Freq: Four times a day (QID) | ORAL | Status: DC | PRN

## 2021-02-03 MED ORDER — ACETAMINOPHEN 500 MG PO TABS
1000.0000 mg | ORAL_TABLET | Freq: Four times a day (QID) | ORAL | Status: DC
  Administered 2021-02-03 – 2021-02-04 (×4): 1000 mg via ORAL
  Filled 2021-02-03 (×4): qty 2

## 2021-02-03 MED ORDER — FENTANYL CITRATE (PF) 100 MCG/2ML IJ SOLN
25.0000 ug | INTRAMUSCULAR | Status: DC | PRN
  Administered 2021-02-03: 50 ug via INTRAVENOUS

## 2021-02-03 MED ORDER — BUPIVACAINE-EPINEPHRINE (PF) 0.25% -1:200000 IJ SOLN
INTRAMUSCULAR | Status: AC
  Filled 2021-02-03: qty 30

## 2021-02-03 MED ORDER — ROCURONIUM BROMIDE 10 MG/ML (PF) SYRINGE
PREFILLED_SYRINGE | INTRAVENOUS | Status: AC
  Filled 2021-02-03: qty 10

## 2021-02-03 MED ORDER — 0.9 % SODIUM CHLORIDE (POUR BTL) OPTIME
TOPICAL | Status: DC | PRN
  Administered 2021-02-03: 1000 mL

## 2021-02-03 MED ORDER — HYDROMORPHONE HCL 1 MG/ML IJ SOLN
1.0000 mg | Freq: Once | INTRAMUSCULAR | Status: AC
Start: 2021-02-03 — End: 2021-02-03
  Administered 2021-02-03: 1 mg via INTRAVENOUS
  Filled 2021-02-03: qty 1

## 2021-02-03 MED ORDER — ONDANSETRON HCL 4 MG/2ML IJ SOLN
INTRAMUSCULAR | Status: AC
  Filled 2021-02-03: qty 2

## 2021-02-03 MED ORDER — HYDROMORPHONE HCL 1 MG/ML IJ SOLN
1.0000 mg | Freq: Once | INTRAMUSCULAR | Status: AC
  Administered 2021-02-03: 1 mg via INTRAVENOUS
  Filled 2021-02-03: qty 1

## 2021-02-03 MED ORDER — SUGAMMADEX SODIUM 200 MG/2ML IV SOLN
INTRAVENOUS | Status: DC | PRN
  Administered 2021-02-03: 200 mg via INTRAVENOUS

## 2021-02-03 MED ORDER — FENTANYL CITRATE (PF) 250 MCG/5ML IJ SOLN
INTRAMUSCULAR | Status: AC
  Filled 2021-02-03: qty 5

## 2021-02-03 MED ORDER — BUPIVACAINE-EPINEPHRINE 0.25% -1:200000 IJ SOLN
INTRAMUSCULAR | Status: DC | PRN
  Administered 2021-02-03: 30 mL

## 2021-02-03 MED ORDER — OXYCODONE HCL 5 MG/5ML PO SOLN
5.0000 mg | ORAL | Status: DC | PRN
  Administered 2021-02-03 (×2): 5 mg via ORAL
  Administered 2021-02-04: 10 mg via ORAL
  Filled 2021-02-03 (×2): qty 5
  Filled 2021-02-03: qty 10

## 2021-02-03 MED ORDER — MORPHINE SULFATE (PF) 4 MG/ML IV SOLN
4.0000 mg | INTRAVENOUS | Status: DC | PRN
Start: 2021-02-03 — End: 2021-02-04

## 2021-02-03 MED ORDER — DOCUSATE SODIUM 100 MG PO CAPS
100.0000 mg | ORAL_CAPSULE | Freq: Two times a day (BID) | ORAL | Status: DC
  Administered 2021-02-03 – 2021-02-04 (×3): 100 mg via ORAL
  Filled 2021-02-03 (×3): qty 1

## 2021-02-03 MED ORDER — PROMETHAZINE HCL 25 MG/ML IJ SOLN: 6.2500 mg | INTRAMUSCULAR | Status: DC | PRN

## 2021-02-03 MED ORDER — DEXAMETHASONE SODIUM PHOSPHATE 10 MG/ML IJ SOLN
INTRAMUSCULAR | Status: DC | PRN
  Administered 2021-02-03: 10 mg via INTRAVENOUS

## 2021-02-03 MED ORDER — METHOCARBAMOL 500 MG PO TABS
1000.0000 mg | ORAL_TABLET | Freq: Three times a day (TID) | ORAL | Status: DC
  Administered 2021-02-03 – 2021-02-04 (×4): 1000 mg via ORAL
  Filled 2021-02-03 (×4): qty 2

## 2021-02-03 SURGICAL SUPPLY — 58 items
ADH SKN CLS APL DERMABOND .7 (GAUZE/BANDAGES/DRESSINGS) ×1
APL PRP STRL LF DISP 70% ISPRP (MISCELLANEOUS) ×1
APL SKNCLS STERI-STRIP NONHPOA (GAUZE/BANDAGES/DRESSINGS)
BAG COUNTER SPONGE SURGICOUNT (BAG) ×2 IMPLANT
BAG SPNG CNTER NS LX DISP (BAG) ×1
BENZOIN TINCTURE PRP APPL 2/3 (GAUZE/BANDAGES/DRESSINGS) IMPLANT
BLADE CLIPPER SURG (BLADE) ×1 IMPLANT
CHLORAPREP W/TINT 26 (MISCELLANEOUS) ×2 IMPLANT
COVER SURGICAL LIGHT HANDLE (MISCELLANEOUS) ×2 IMPLANT
DERMABOND ADVANCED (GAUZE/BANDAGES/DRESSINGS) ×1
DERMABOND ADVANCED .7 DNX12 (GAUZE/BANDAGES/DRESSINGS) ×1 IMPLANT
DRAIN PENROSE 1/2X12 LTX STRL (WOUND CARE) ×1 IMPLANT
DRAPE LAPAROSCOPIC ABDOMINAL (DRAPES) ×1 IMPLANT
DRAPE LAPAROTOMY TRNSV 102X78 (DRAPES) IMPLANT
DRSG TEGADERM 4X4.75 (GAUZE/BANDAGES/DRESSINGS) IMPLANT
ELECT CAUTERY BLADE 6.4 (BLADE) ×1 IMPLANT
ELECT REM PT RETURN 9FT ADLT (ELECTROSURGICAL) ×2
ELECTRODE REM PT RTRN 9FT ADLT (ELECTROSURGICAL) ×1 IMPLANT
GAUZE 4X4 16PLY ~~LOC~~+RFID DBL (SPONGE) ×2 IMPLANT
GAUZE SPONGE 4X4 12PLY STRL (GAUZE/BANDAGES/DRESSINGS) ×1 IMPLANT
GLOVE SRG 8 PF TXTR STRL LF DI (GLOVE) IMPLANT
GLOVE SURG ENC MOIS LTX SZ6.5 (GLOVE) ×2 IMPLANT
GLOVE SURG LTX SZ8 (GLOVE) ×1 IMPLANT
GLOVE SURG POLYISO LF SZ6 (GLOVE) ×1 IMPLANT
GLOVE SURG POLYISO LF SZ7 (GLOVE) ×1 IMPLANT
GLOVE SURG UNDER POLY LF SZ6 (GLOVE) ×2 IMPLANT
GLOVE SURG UNDER POLY LF SZ6.5 (GLOVE) ×1 IMPLANT
GLOVE SURG UNDER POLY LF SZ7 (GLOVE) ×2 IMPLANT
GLOVE SURG UNDER POLY LF SZ8 (GLOVE) ×2
GOWN STRL REUS W/ TWL LRG LVL3 (GOWN DISPOSABLE) ×3 IMPLANT
GOWN STRL REUS W/TWL LRG LVL3 (GOWN DISPOSABLE) ×6
KIT BASIN OR (CUSTOM PROCEDURE TRAY) ×2 IMPLANT
KIT TURNOVER KIT B (KITS) ×2 IMPLANT
MESH ULTRAPRO 3X6 7.6X15CM (Mesh General) ×1 IMPLANT
NDL HYPO 25GX1X1/2 BEV (NEEDLE) ×1 IMPLANT
NEEDLE HYPO 25GX1X1/2 BEV (NEEDLE) ×2 IMPLANT
NS IRRIG 1000ML POUR BTL (IV SOLUTION) ×2 IMPLANT
PACK GENERAL/GYN (CUSTOM PROCEDURE TRAY) ×2 IMPLANT
PAD ARMBOARD 7.5X6 YLW CONV (MISCELLANEOUS) ×2 IMPLANT
PENCIL SMOKE EVACUATOR (MISCELLANEOUS) ×2 IMPLANT
SPONGE INTESTINAL PEANUT (DISPOSABLE) IMPLANT
SPONGE T-LAP 18X18 ~~LOC~~+RFID (SPONGE) ×1 IMPLANT
STAPLER VISISTAT 35W (STAPLE) ×1 IMPLANT
SUT MNCRL AB 4-0 PS2 18 (SUTURE) ×2 IMPLANT
SUT NOVA NAB GS-21 0 18 T12 DT (SUTURE) IMPLANT
SUT PDS AB 0 CT 36 (SUTURE) IMPLANT
SUT SILK 0 SH 30 (SUTURE) IMPLANT
SUT SILK 2 0 SH (SUTURE) ×1 IMPLANT
SUT SILK 3 0 (SUTURE)
SUT SILK 3-0 18XBRD TIE 12 (SUTURE) IMPLANT
SUT VIC AB 0 CT2 27 (SUTURE) ×2 IMPLANT
SUT VIC AB 2-0 SH 27 (SUTURE) ×4
SUT VIC AB 2-0 SH 27X BRD (SUTURE) ×1 IMPLANT
SUT VIC AB 3-0 SH 27 (SUTURE) ×2
SUT VIC AB 3-0 SH 27XBRD (SUTURE) ×1 IMPLANT
SYR CONTROL 10ML LL (SYRINGE) ×2 IMPLANT
TOWEL GREEN STERILE (TOWEL DISPOSABLE) ×2 IMPLANT
TOWEL GREEN STERILE FF (TOWEL DISPOSABLE) ×2 IMPLANT

## 2021-02-03 NOTE — ED Notes (Signed)
Surgery at bedside.

## 2021-02-03 NOTE — ED Provider Notes (Signed)
[facility] EMERGENCY DEPARTMENT Provider Note   CSN: [id number] Arrival date & time: 02/02/21  2056     History Chief Complaint  Patient presents with   Abdominal Pain    Victor [name] is a 50 y.o. male.  The history is provided by the patient.  Abdominal Pain Pain location:  RLQ Pain quality: aching   Pain radiates to:  Does not radiate Pain severity:  Severe Onset quality:  Gradual Duration:  3 days Timing:  Constant Progression:  Worsening Chronicity:  New Relieved by:  Nothing Worsened by:  Movement and coughing Associated symptoms: no constipation, no fever and no vomiting   Patient with history of hypertension presents with inguinal hernia Patient reports long history of hernia, but over the past 3 days it is worsened and is bulging out more and very painful.  He reports this occurred after lifting a refrigerator. He has never had hernia surgery before    Past Medical History:  Diagnosis Date   Dizziness 10/15/2013    "SWIMMY HEAD    "   Hypertension    Hypertriglyceridemia    Obesity     Patient Active Problem List   Diagnosis Date Noted   HTN (hypertension) 12/06/2014   Dyspnea 10/16/2013   Hypertriglyceridemia    Dizziness 10/15/2013   Weakness 10/15/2013   Abnormal EKG 10/15/2013    Past Surgical History:  Procedure Laterality Date   NO PAST SURGERIES         Family History  Problem Relation Age of Onset   CAD Mother        stenting @ 47; alive @ 24   Aneurysm Mother        thoracic s/p surgery @ 69   Lung disease Mother    Other Father        unknown    Social History   Tobacco Use   Smoking status: Never   Smokeless tobacco: Current    Types: Chew  Vaping Use   Vaping Use: Never used  Substance Use Topics   Alcohol use: Yes    Comment: 4 12 oz beers twice/wk.   Drug use: No    Home Medications Prior to Admission medications   Medication Sig Start Date End Date Taking? Authorizing Provider  amLODipine  (NORVASC) 10 MG tablet TAKE 1 TABLET (10 MG TOTAL) BY MOUTH DAILY. 07/27/20 07/27/21  Claiborne Rigg, NP  aspirin 81 MG EC tablet Take 1 tablet (81 mg total) by mouth daily. 07/27/20   Claiborne Rigg, NP  docusate sodium (COLACE) 100 MG capsule Take 1 capsule (100 mg total) by mouth every 12 (twelve) hours. 12/31/19   Mesner, Barbara Cower, MD  gemfibrozil (LOPID) 600 MG tablet TAKE 1 TABLET BY MOUTH 2 TIMES DAILY BEFORE A MEAL. 07/27/20 07/27/21  Claiborne Rigg, NP  losartan (COZAAR) 50 MG tablet TAKE 1 TABLET (50 MG TOTAL) BY MOUTH DAILY. 07/27/20 07/27/21  Claiborne Rigg, NP  rosuvastatin (CRESTOR) 20 MG tablet TAKE 1 TABLET (20 MG TOTAL) BY MOUTH DAILY. 07/27/20 07/27/21  Claiborne Rigg, NP    Allergies    Patient has no known allergies.  Review of Systems   Review of Systems  Constitutional:  Negative for fever.  Gastrointestinal:  Positive for abdominal pain. Negative for constipation and vomiting.  Genitourinary:  Negative for difficulty urinating.  All other systems reviewed and are negative.  Physical Exam Updated Vital Signs BP (!) 168/78 (BP Location: Right Arm)   Pulse 98  Temp 98.1 F (36.7 C) (Oral)   Resp 18   SpO2 98%   Physical Exam CONSTITUTIONAL: Well developed/well nourished, uncomfortable. HEAD: Normocephalic/atraumatic EYES: EOMI/PERRL NECK: supple no meningeal signs SPINE/BACK:entire spine nontender CV: S1/S2 noted, systolic ejection murmur noted(patient reports chronic) LUNGS: Lungs are clear to auscultation bilaterally, no apparent distress ABDOMEN: soft, nontender, no rebound or guarding, bowel sounds noted throughout abdomen GU: Large right inguinal hernia noted that extends into scrotum.  No overlying erythema or skin changes.  Tender to palpation. NEURO: Pt is awake/alert/appropriate, moves all extremitiesx4.  No facial droop.   EXTREMITIES: pulses normal/equal, full ROM SKIN: warm, color normal PSYCH: no abnormalities of mood noted, alert and oriented to  situation  ED Results / Procedures / Treatments   Labs (all labs ordered are listed, but only abnormal results are displayed) Labs Reviewed  CBC WITH DIFFERENTIAL/PLATELET - Abnormal; Notable for the following components:      Result Value   RDW 18.6 (*)    Platelets 100 (*)    All other components within normal limits  COMPREHENSIVE METABOLIC PANEL - Abnormal; Notable for the following components:   Sodium 133 (*)    CO2 21 (*)    Glucose, Bld 123 (*)    AST 75 (*)    ALT 46 (*)    Total Bilirubin 1.5 (*)    All other components within normal limits  LIPASE, BLOOD    EKG None  Radiology CT ABDOMEN PELVIS W CONTRAST  Result Date: 02/03/2021 CLINICAL DATA:  50 year old male with abdominal pain. Concern for complicated hernia. EXAM: CT ABDOMEN AND PELVIS WITH CONTRAST TECHNIQUE: Multidetector CT imaging of the abdomen and pelvis was performed using the standard protocol following bolus administration of intravenous contrast. CONTRAST:  OMNIPAQUE IOHEXOL 300 MG/ML  SOLN COMPARISON:  CT abdomen pelvis dated 12/31/2019. FINDINGS: Lower chest: The visualized lung bases are clear. There is coronary vascular calcification. No intra-abdominal free air or free fluid. Hepatobiliary: Fatty liver. There is irregularity of the liver contour suspicious for early changes of cirrhosis. No intrahepatic biliary dilatation. The gallbladder is unremarkable. Pancreas: Unremarkable. No pancreatic ductal dilatation or surrounding inflammatory changes. Spleen: Normal in size without focal abnormality. Adrenals/Urinary Tract: The adrenal glands are unremarkable. There is no hydronephrosis on either side. There is symmetric enhancement and excretion of contrast by both kidneys. The visualized ureters and urinary bladder appear unremarkable. Stomach/Bowel: There is herniation of the cecum, portion of the terminal ileum, and ileocecal bowel as well as the appendix into the right inguinal canal. There is a 3 mm  stone in the distal lumen of the appendix. There is compression of the terminal ileum at the neck of the hernia defect with mild or low-grade obstruction. There is inflammatory changes of the terminal ileum and cecum. Small amount of fluid noted within the right inguinal hernia. The appendix however appears unremarkable. There is mild diffuse edema of the mesentery, likely reactive to inflammatory changes of the terminal ileum. Vascular/Lymphatic: The abdominal aorta and IVC unremarkable. No portal venous gas. There is no adenopathy. Reproductive: The prostate and seminal vesicles are grossly unremarkable. No pelvic mass. Other: There is a small fat containing umbilical hernia. Musculoskeletal: Degenerative changes of the spine. No acute osseous pathology. IMPRESSION: 1. Herniation of the cecum, portion of the terminal ileum, and the appendix into the right inguinal canal. There is inflammatory changes of the terminal ileum with early or low grade obstruction secondary to compression at the neck of the hernia. 2. Fatty liver  with probable early cirrhosis. 3. Aortic Atherosclerosis (ICD10-I70.0). Electronically Signed   By: Elgie Collard M.D.   On: 02/03/2021 01:27    Procedures Procedures   Medications Ordered in ED Medications  iohexol (OMNIPAQUE) 300 MG/ML solution 100 mL (100 mLs Intravenous Contrast Given 02/03/21 0028)  HYDROmorphone (DILAUDID) injection 1 mg (1 mg Intravenous Given 02/03/21 0054)    ED Course  I have reviewed the triage vital signs and the nursing notes.  Pertinent labs & imaging results that were available during my care of the patient were reviewed by me and considered in my medical decision making (see chart for details).    MDM Rules/Calculators/A&P                         1:40 AM Discussed with Dr. Bedelia Person with general surgery.  She will evaluate the patient due to hernia with concerning signs of bowel obstruction.  Patient updated on plan 4:12 AM Discussed with Dr.  Bedelia Person.  She plans admit to the hospital.  We will keep patient n.p.o. Final Clinical Impression(s) / ED Diagnoses Final diagnoses:  Incarcerated hernia    Rx / DC Orders ED Discharge Orders     None        Zadie Rhine, MD 02/03/21 [phone]

## 2021-02-03 NOTE — Progress Notes (Signed)
Highest bladder scan of 3 was 419 mL

## 2021-02-03 NOTE — Anesthesia Procedure Notes (Signed)
Procedure Name: Intubation Date/Time: 02/03/2021 5:50 AM Performed by: Molli Hazard, CRNA Pre-anesthesia Checklist: Patient identified, Emergency Drugs available, Suction available and Patient being monitored Patient Re-evaluated:Patient Re-evaluated prior to induction Oxygen Delivery Method: Circle system utilized Preoxygenation: Pre-oxygenation with 100% oxygen Induction Type: IV induction, Rapid sequence and Cricoid Pressure applied Laryngoscope Size: Miller and 2 Grade View: Grade I Tube type: Oral Tube size: 7.5 mm Number of attempts: 1 Airway Equipment and Method: Stylet Placement Confirmation: ETT inserted through vocal cords under direct vision, positive ETCO2 and breath sounds checked- equal and bilateral Secured at: 23 cm Tube secured with: Tape Dental Injury: Teeth and Oropharynx as per pre-operative assessment

## 2021-02-03 NOTE — Anesthesia Preprocedure Evaluation (Signed)
Anesthesia Evaluation  Patient identified by MRN, date of birth, ID band Patient awake    Reviewed: Allergy & Precautions, NPO status , Patient's Chart, lab work & pertinent test results  Airway Mallampati: II  TM Distance: >3 FB Neck ROM: Full    Dental  (+) Edentulous Upper, Edentulous Lower   Pulmonary neg pulmonary ROS,    Pulmonary exam normal        Cardiovascular hypertension, Pt. on medications  Rhythm:Regular Rate:Normal     Neuro/Psych negative neurological ROS  negative psych ROS   GI/Hepatic Neg liver ROS, Incarcerated right inguinal hernia   Endo/Other  negative endocrine ROS  Renal/GU negative Renal ROS  negative genitourinary   Musculoskeletal negative musculoskeletal ROS (+)   Abdominal (+)  Abdomen: tender.    Peds  Hematology negative hematology ROS (+)   Anesthesia Other Findings   Reproductive/Obstetrics                             Anesthesia Physical Anesthesia Plan  ASA: 2 and emergent  Anesthesia Plan: General   Post-op Pain Management:    Induction: Intravenous and Rapid sequence  PONV Risk Score and Plan: 2 and Ondansetron, Dexamethasone, Midazolam and Treatment may vary due to age or medical condition  Airway Management Planned: Mask and Oral ETT  Additional Equipment: None  Intra-op Plan:   Post-operative Plan: Extubation in OR  Informed Consent: I have reviewed the patients History and Physical, chart, labs and discussed the procedure including the risks, benefits and alternatives for the proposed anesthesia with the patient or authorized representative who has indicated his/her understanding and acceptance.     Dental advisory given  Plan Discussed with: CRNA  Anesthesia Plan Comments: (Lab Results      Component                Value               Date                      WBC                      7.0                 02/02/2021                 HGB                      13.7                02/02/2021                HCT                      41.6                02/02/2021                MCV                      94.3                02/02/2021                PLT  100 (L)             02/02/2021           Lab Results      Component                Value               Date                      NA                       133 (L)             02/02/2021                K                        3.7                 02/02/2021                CO2                      21 (L)              02/02/2021                GLUCOSE                  123 (H)             02/02/2021                BUN                      6                   02/02/2021                CREATININE               0.85                02/02/2021                CALCIUM                  9.2                 02/02/2021                GFRNONAA                 >60                 02/02/2021                GFRAA                    108                 07/27/2020          )        Anesthesia Quick Evaluation

## 2021-02-03 NOTE — Progress Notes (Signed)
Pt urinated on own as I was getting ready to do an in and out cath. He urinated x2 by 1900.  He wants to ask the MD about swelling and inability to move fingers and left shoulder from time to time.  Patient at a large lunch and dinner and tolerated both well.  Pt will have a ride with his daughter when d/c tomorrow.

## 2021-02-03 NOTE — OR Nursing (Signed)
Change of shift report given to Adline Peals, RN.

## 2021-02-03 NOTE — Discharge Instructions (Signed)
May shower beginning 02/04/2021. Do not peel off or scrub skin glue. May allow warm soapy water to run over incision, then rinse and pat dry. Do not soak in any water (tubs, hot tubs, pools, lakes, oceans) for one week.   No lifting greater than 5 pounds for six weeks.   Pain regimen: take over-the-counter tylenol (acetaminophen) 1000mg  every six hours, the prescription ibuprofen (600mg ) every six hours and the robaxin (methocarbamol) 750mg  every six hours. With all three of these, you should be taking something every two hours. Example: tylenol ( acetaminophen) at 8am, ibuprofen at 10am, robaxin (methocarbamol) at 12pm, tylenol (acetaminophen) again at 2pm, ibuprofen again at 4pm, robaxin (methocarbamol) at 6pm. You also have a prescription for oxycodone, which should be taken if the tylenol (acetaminophen), ibuprofen, and robaxin (methocarbamol) are not enough to control your pain. You may take the oxycodone as frequently as every four hours as needed, but if you are taking the other medications as above, you should not need the oxycodone this frequently. You have also been given a prescription for colace (docusate) which is a stool softener. Please take this as prescribed because the oxycodone can cause constipation and the colace (docusate) will minimize or prevent constipation.  Call the office at [phone] for temperature greater than 101.47F, worsening pain, redness or warmth at the incision site.  Please call [phone] to make an appointment for 3 weeks after surgery for wound check.

## 2021-02-03 NOTE — Op Note (Addendum)
   Operative Note   Date: 02/03/2021  Procedure: open inguinal hernia repair with mesh  Pre-op diagnosis: incarcerated inguinal hernia Post-op diagnosis: sam  Indication and clinical history: The patient is a 50 y.o. year old male with an incarcerated inguinal hernia and concern for early signs of strangulation.  Surgeon: Diamantina Monks, MD Assistant: Michaell Cowing, MD  Anesthesiologist: Nance Pew, MD Anesthesia: General  Findings:  Specimen: right inguinal hernia sac EBL: 20cc Drains/Implants: polypropylene mesh  Disposition: PACU - hemodynamically stable.  Description of procedure: The patient was positioned supine on the operating room table. General anesthetic induction and intubation were uneventful. Foley catheter insertion was performed and was atraumatic. Time-out was performed verifying correct patient, procedure, signature of informed consent, and administration of pre-operative antibiotics. The abdomen and right groin was prepped and draped in the usual sterile fashion.  An incision was made approximately 2/3 distance from the ASIS to the pubic tubercle and deepened through Scarpa's fascia until the external oblique aponeurosis was reached. The external oblique aponeurosis was entered sharply and opened using Metzenbaum scissors, so as to avoid injuring the ilioinguinal nerve. The spermatic cord was isolated and encircled with a Penrose drain. A direct hernia was clearly identified and the contents easily reduced into the abdomen. The hernia sac was opened and adhesions tethering the appendix to the hernia sac were identified and lysed with electrocautery. A pursestring suture was used to ligate the hernia sac and the sac transected and sent to pathology. A 3x6 mesh was cut to fit and sutured to the pubic tubercle with a 2-0 vicryl suture and run along the shelving edge inferiorly and the conjoined tendon superiorly. A slit was made in the center of the mesh to accommodate the cord  structures and a vicryl suture was used to create a snug fit around it. The ends of the mesh were overlapped beneath the external oblique aponeurosis. The external oblique was closed with a 2-0 vicryl suture and Scarpa's closed with a 3-0 vicryl. Local anesthetic was infiltrated into the surrounding tissues. The skin was closed with 4-0 monocryl suture and dermabond applied as dressing.   Sterile dressings were applied. All sponge and instrument counts were correct at the conclusion of the procedure. The patient was awakened from anesthesia, extubated uneventfully, and transported to the PACU in good condition. There were no complications.    Diamantina Monks, MD General and Trauma Surgery [facility] Surgery

## 2021-02-03 NOTE — Transfer of Care (Signed)
Immediate Anesthesia Transfer of Care Note  Patient: Victor [name]  Procedure(s) Performed: HERNIA REPAIR INGUINAL ADULT (Right: Inguinal)  Patient Location: PACU  Anesthesia Type:General  Level of Consciousness: drowsy, patient cooperative and responds to stimulation  Airway & Oxygen Therapy: Patient Spontanous Breathing  Post-op Assessment: Report given to RN and Post -op Vital signs reviewed and stable  Post vital signs: Reviewed and stable  Last Vitals:  Vitals Value Taken Time  BP 141/76 02/03/21 0726  Temp 37.3 C 02/03/21 0726  Pulse 103 02/03/21 0728  Resp 17 02/03/21 0728  SpO2 96 % 02/03/21 0728  Vitals shown include unvalidated device data.  Last Pain:  Vitals:   02/03/21 0726  TempSrc:   PainSc: 0-No pain         Complications: No notable events documented.

## 2021-02-03 NOTE — ED Notes (Addendum)
Surgical consent completed at this time and at bedside. Pt changed into hospital gown with socks. All belongings placed in to a total of 2 belongings bags, labeled, at bedside.

## 2021-02-03 NOTE — H&P (Signed)
Reason for Consult/Chief Complaint:groin pain Consultant: Bebe Shaggy, MD  Victor [name] is an 50 y.o. male.   HPI: 64M with R groin pain x3d that has been progressively worsening. No associated nausea/vomiting/changes to bowels. Has never seen a Careers adviser for repair. [place] [facility] with difficult access to transportation in general, usually transported by daughter who lives in [place]. Seen by [facility] and [facility]. Seen 97m ago and has an appointment 7/19 to discuss shoulder pain. No prior colonoscopy.   Past Medical History:  Diagnosis Date   Dizziness 10/15/2013    "SWIMMY HEAD    "   Hypertension    Hypertriglyceridemia    Obesity     Past Surgical History:  Procedure Laterality Date   NO PAST SURGERIES      Family History  Problem Relation Age of Onset   CAD Mother        stenting @ 7; alive @ 18   Aneurysm Mother        thoracic s/p surgery @ 45   Lung disease Mother    Other Father        unknown    Social History:  reports that he has never smoked. His smokeless tobacco use includes chew. He reports current alcohol use. He reports that he does not use drugs.  Allergies: No Known Allergies  Medications: I have reviewed the patient's current medications.  Results for orders placed or performed during the hospital encounter of 02/02/21 (from the past 48 hour(s))  CBC with Differential     Status: Abnormal   Collection Time: 02/02/21  9:16 PM  Result Value Ref Range   WBC 7.0 4.0 - 10.5 K/uL   RBC 4.41 4.22 - 5.81 MIL/uL   Hemoglobin 13.7 13.0 - 17.0 g/dL   HCT 92.3 30.0 - 76.2 %   MCV 94.3 80.0 - 100.0 fL   MCH 31.1 26.0 - 34.0 pg   MCHC 32.9 30.0 - 36.0 g/dL   RDW 26.3 (H) 33.5 - 45.6 %   Platelets 100 (L) 150 - 400 K/uL    Comment: Immature Platelet Fraction may be clinically indicated, consider ordering this additional test [id number] CONSISTENT WITH PREVIOUS RESULT REPEATED TO VERIFY    nRBC 0.0 0.0 - 0.2 %    Neutrophils Relative % 72 %   Neutro Abs 5.1 1.7 - 7.7 K/uL   Lymphocytes Relative 18 %   Lymphs Abs 1.2 0.7 - 4.0 K/uL   Monocytes Relative 9 %   Monocytes Absolute 0.6 0.1 - 1.0 K/uL   Eosinophils Relative 1 %   Eosinophils Absolute 0.1 0.0 - 0.5 K/uL   Basophils Relative 0 %   Basophils Absolute 0.0 0.0 - 0.1 K/uL   Immature Granulocytes 0 %   Abs Immature Granulocytes 0.02 0.00 - 0.07 K/uL    Comment: Performed at [facility], [address], [place], [place] [address]  Comprehensive metabolic panel     Status: Abnormal   Collection Time: 02/02/21  9:16 PM  Result Value Ref Range   Sodium 133 (L) 135 - 145 mmol/L   Potassium 3.7 3.5 - 5.1 mmol/L   Chloride 103 98 - 111 mmol/L   CO2 21 (L) 22 - 32 mmol/L   Glucose, Bld 123 (H) 70 - 99 mg/dL    Comment: Glucose reference range applies only to samples taken after fasting for at least 8 hours.   BUN 6 6 - 20 mg/dL   Creatinine, Ser  0.85 0.61 - 1.24 mg/dL   Calcium 9.2 8.9 - 22.2 mg/dL   Total Protein 7.4 6.5 - 8.1 g/dL   Albumin 3.9 3.5 - 5.0 g/dL   AST 75 (H) 15 - 41 U/L   ALT 46 (H) 0 - 44 U/L   Alkaline Phosphatase 52 38 - 126 U/L   Total Bilirubin 1.5 (H) 0.3 - 1.2 mg/dL   GFR, Estimated >97 >98 mL/min    Comment: (NOTE) Calculated using the CKD-EPI Creatinine Equation (2021)    Anion gap 9 5 - 15    Comment: Performed at [facility], [address], [place], [place] [address]  Lipase, blood     Status: None   Collection Time: 02/02/21  9:16 PM  Result Value Ref Range   Lipase 32 11 - 51 U/L    Comment: Performed at [facility], [address], [place], [place] [address]    CT ABDOMEN PELVIS W CONTRAST  Result Date: 02/03/2021 CLINICAL DATA:  50 year old male with abdominal pain. Concern for complicated hernia. EXAM: CT ABDOMEN AND PELVIS WITH CONTRAST TECHNIQUE: Multidetector CT imaging of the abdomen and pelvis was performed using the standard protocol following bolus administration of  intravenous contrast. CONTRAST:  OMNIPAQUE IOHEXOL 300 MG/ML  SOLN COMPARISON:  CT abdomen pelvis dated 12/31/2019. FINDINGS: Lower chest: The visualized lung bases are clear. There is coronary vascular calcification. No intra-abdominal free air or free fluid. Hepatobiliary: Fatty liver. There is irregularity of the liver contour suspicious for early changes of cirrhosis. No intrahepatic biliary dilatation. The gallbladder is unremarkable. Pancreas: Unremarkable. No pancreatic ductal dilatation or surrounding inflammatory changes. Spleen: Normal in size without focal abnormality. Adrenals/Urinary Tract: The adrenal glands are unremarkable. There is no hydronephrosis on either side. There is symmetric enhancement and excretion of contrast by both kidneys. The visualized ureters and urinary bladder appear unremarkable. Stomach/Bowel: There is herniation of the cecum, portion of the terminal ileum, and ileocecal bowel as well as the appendix into the right inguinal canal. There is a 3 mm stone in the distal lumen of the appendix. There is compression of the terminal ileum at the neck of the hernia defect with mild or low-grade obstruction. There is inflammatory changes of the terminal ileum and cecum. Small amount of fluid noted within the right inguinal hernia. The appendix however appears unremarkable. There is mild diffuse edema of the mesentery, likely reactive to inflammatory changes of the terminal ileum. Vascular/Lymphatic: The abdominal aorta and IVC unremarkable. No portal venous gas. There is no adenopathy. Reproductive: The prostate and seminal vesicles are grossly unremarkable. No pelvic mass. Other: There is a small fat containing umbilical hernia. Musculoskeletal: Degenerative changes of the spine. No acute osseous pathology. IMPRESSION: 1. Herniation of the cecum, portion of the terminal ileum, and the appendix into the right inguinal canal. There is inflammatory changes of the terminal ileum with  early or low grade obstruction secondary to compression at the neck of the hernia. 2. Fatty liver with probable early cirrhosis. 3. Aortic Atherosclerosis (ICD10-I70.0). Electronically Signed   By: Elgie Collard M.D.   On: 02/03/2021 01:27    ROS 10 point review of systems is negative except as listed above in HPI.   Physical Exam Blood pressure (!) 142/83, pulse 90, temperature 98.1 F (36.7 C), temperature source Oral, resp. rate 18, SpO2 98 %. Constitutional: well-developed, well-nourished HEENT: pupils equal, round, reactive to light, 18mm b/l, moist conjunctiva, external inspection of ears and nose normal, hearing intact  Oropharynx: normal oropharyngeal mucosa, normal dentition Neck: no thyromegaly, trachea midline, no midline cervical tenderness to palpation Chest: breath sounds equal bilaterally, normal respiratory effort, no midline or lateral chest wall tenderness to palpation/deformity Abdomen: soft, NT, no bruising, no hepatosplenomegaly GU: large tender scrotum on the right, non-reducible Back: no wounds, no thoracic/lumbar spine tenderness to palpation, no thoracic/lumbar spine stepoffs Rectal: good tone, no blood Extremities: 2+ radial and pedal pulses bilaterally, motor and sensation intact to bilateral UE and LE, no peripheral edema MSK: unable to assess gait/station, no clubbing/cyanosis of fingers/toes, normal ROM of all four extremities Skin: warm, dry, no rashes Psych: normal memory, normal mood/affect    Assessment/Plan: 51M with incarcerated RIH, suspicious for impending/early strangulation. To OR for open RIHR. Discussed plan for mesh implantation unless bowel compromise requiring resection is evident. Informed consent was obtained after detailed explanation of risks, including bleeding, infection, hematoma/seroma, decreased fertility, temporary or permanent neuropathy, hernia recurrence, mesh infection requiring explantation, and need for exlap +/- bowel resection.  All questions answered to the patient's satisfaction.   Diamantina Monks, MD General and Trauma Surgery [facility]

## 2021-02-04 LAB — CBC
HCT: 33.3 % — ABNORMAL LOW (ref 39.0–52.0)
Hemoglobin: 10.8 g/dL — ABNORMAL LOW (ref 13.0–17.0)
MCH: 31 pg (ref 26.0–34.0)
MCHC: 32.4 g/dL (ref 30.0–36.0)
MCV: 95.7 fL (ref 80.0–100.0)
Platelets: 84 10*3/uL — ABNORMAL LOW (ref 150–400)
RBC: 3.48 MIL/uL — ABNORMAL LOW (ref 4.22–5.81)
RDW: 18.6 % — ABNORMAL HIGH (ref 11.5–15.5)
WBC: 9.8 10*3/uL (ref 4.0–10.5)
nRBC: 0 % (ref 0.0–0.2)

## 2021-02-04 LAB — BASIC METABOLIC PANEL
Anion gap: 7 (ref 5–15)
BUN: 10 mg/dL (ref 6–20)
CO2: 22 mmol/L (ref 22–32)
Calcium: 8.5 mg/dL — ABNORMAL LOW (ref 8.9–10.3)
Chloride: 103 mmol/L (ref 98–111)
Creatinine, Ser: 0.88 mg/dL (ref 0.61–1.24)
GFR, Estimated: >60 mL/min (ref >60)
Glucose, Bld: 143 mg/dL — ABNORMAL HIGH (ref 70–99)
Potassium: 3.8 mmol/L (ref 3.5–5.1)
Sodium: 132 mmol/L — ABNORMAL LOW (ref 135–145)

## 2021-02-04 MED ORDER — HYDROCODONE-ACETAMINOPHEN 5-325 MG PO TABS: 1.0000 | ORAL_TABLET | Freq: Four times a day (QID) | ORAL | 0 refills | Status: DC | PRN

## 2021-02-04 NOTE — Progress Notes (Signed)
AVS given and reviewed with pt. Medications discussed. All questions answered to satisfaction. Pt verbalized understanding of information given. Pt escorted off the unit with all belongings via wheelchair by staff member.  

## 2021-02-04 NOTE — Discharge Summary (Addendum)
Physician Discharge Summary  Patient ID: Victor [name] MRN: [id number] DOB/AGE: 52-Jul-1972 52 y.o.  PCP: Claiborne Rigg, NP  Admit date: 02/02/2021 Discharge date: 02/04/2021  Admission Diagnoses:  Right inguinal hernia  Discharge Diagnoses:  right incarcerated inguinal hernia  Active Problems:   S/P hernia repair   Inguinal hernia   Surgery:  open RIH repair  Discharged Condition: improved  Hospital Course:   had surgery on Friday and ready for discharge on Saturday. He was voiding without difficulty.   Incision OK  Consults: none  Significant Diagnostic Studies: none    Discharge Exam: Blood pressure (!) 104/59, pulse 92, temperature 98.6 F (37 C), temperature source Oral, resp. rate 17, height 5\' 6"  (1.676 m), weight 86.2 kg, SpO2 99 %. Incision is bland and covered with Dermabond  Disposition: Discharge disposition: 01-Home or Self Care       Discharge Instructions     Call MD for:  redness, tenderness, or signs of infection (pain, swelling, redness, odor or green/yellow discharge around incision site)   Complete by: As directed    Diet - low sodium heart healthy   Complete by: As directed    Discharge instructions   Complete by: As directed    You may shower when you get home.   Increase activity slowly   Complete by: As directed       Allergies as of 02/04/2021   No Known Allergies      Medication List     TAKE these medications    amLODipine 10 MG tablet Commonly known as: NORVASC TAKE 1 TABLET (10 MG TOTAL) BY MOUTH DAILY.   aspirin 81 MG EC tablet Take 1 tablet (81 mg total) by mouth daily.   docusate sodium 100 MG capsule Commonly known as: COLACE Take 1 capsule (100 mg total) by mouth every 12 (twelve) hours.   gemfibrozil 600 MG tablet Commonly known as: LOPID TAKE 1 TABLET BY MOUTH 2 TIMES DAILY BEFORE A MEAL.   HYDROcodone-acetaminophen 5-325 MG tablet Commonly known as: NORCO/VICODIN Take 1 tablet by mouth every 6 (six)  hours as needed for moderate pain.   losartan 50 MG tablet Commonly known as: COZAAR TAKE 1 TABLET (50 MG TOTAL) BY MOUTH DAILY.   rosuvastatin 20 MG tablet Commonly known as: CRESTOR TAKE 1 TABLET (20 MG TOTAL) BY MOUTH DAILY.        Follow-up Information     02/06/2021, MD Follow up in 3 week(s).   Specialty: Surgery Why: We are working on your follow up appointment. Please call to confirm your appointment time. Please arrive 30 minutes prior to your appointment for follow up. Please bring a copy of your photo ID and insurance card. Contact information: [address] [place] [phone]                 Signed: [phone] 02/04/2021, 8:22 AM

## 2021-02-06 ENCOUNTER — Encounter (HOSPITAL_COMMUNITY): Payer: Self-pay | Admitting: Surgery

## 2021-02-06 ENCOUNTER — Telehealth: Payer: Self-pay

## 2021-02-06 LAB — SURGICAL PATHOLOGY

## 2021-02-06 NOTE — Telephone Encounter (Signed)
Call returned to patient # [phone] , message left with call back requested to this CM

## 2021-02-06 NOTE — Telephone Encounter (Signed)
Transition Care Management Follow-up Telephone Call  Call received from patient. Date of discharge and from where: 02/04/2021, [facility]  How have you been since you were released from the hospital? He said he is doing fine, just a little sore.  Any questions or concerns? No  Items Reviewed: Did the pt receive and understand the discharge instructions provided? Yes  Medications obtained and verified? Yes  - he said he has all of his medications and did not have any questions about his med regime.  Other? No  Any new allergies since your discharge? No  Do you have support at home? Yes   He said that his surgical site is healing well.   Home Care and Equipment/Supplies: Were home health services ordered? no If so, what is the name of the agency? N/a  Has the agency set up a time to come to the patient's home? not applicable Were any new equipment or medical supplies ordered?  No What is the name of the medical supply agency? N/a Were you able to get the supplies/equipment? not applicable Do you have any questions related to the use of the equipment or supplies? No  Functional Questionnaire: (I = Independent and D = Dependent) ADLs: independent   Follow up appointments reviewed:  PCP Hospital f/u appt confirmed? Yes  Scheduled to see Ms Meredeth Ide, NP on 02/14/2021. Specialist Hospital f/u appt confirmed? Yes  Scheduled to see surgery on 02/16/2021.  Are transportation arrangements needed? No  If their condition worsens, is the pt aware to call PCP or go to the Emergency Dept.? Yes Was the patient provided with contact information for the PCP's office or ED? Yes Was to pt encouraged to call back with questions or concerns? Yes

## 2021-02-06 NOTE — Telephone Encounter (Signed)
Transition Care Management Unsuccessful Follow-up Telephone Call  Date of discharge and from where:  02/04/2021, [facility]  Attempts:  1st Attempt  Reason for unsuccessful TCM follow-up call:  Left voice message on # [phone]

## 2021-02-06 NOTE — Telephone Encounter (Signed)
Pt returned call during lunch, has questions.

## 2021-02-07 ENCOUNTER — Encounter (HOSPITAL_COMMUNITY): Payer: Self-pay | Admitting: Surgery

## 2021-02-12 NOTE — Anesthesia Postprocedure Evaluation (Signed)
Anesthesia Post Note  Patient: [name]  Procedure(s) Performed: HERNIA REPAIR INGUINAL ADULT (Right: Inguinal) INSERTION OF MESH (Right: Inguinal)     Patient location during evaluation: PACU Anesthesia Type: General Level of consciousness: awake and alert Pain management: pain level controlled Vital Signs Assessment: post-procedure vital signs reviewed and stable Respiratory status: spontaneous breathing, nonlabored ventilation, respiratory function stable and patient connected to nasal cannula oxygen Cardiovascular status: blood pressure returned to baseline and stable Postop Assessment: no apparent nausea or vomiting Anesthetic complications: no   No notable events documented.  Last Vitals:  Vitals:   02/04/21 0421 02/04/21 0845  BP: (!) 104/59 122/63  Pulse: 92 91  Resp: 17 17  Temp: 37 C 37.6 C  SpO2: 99% 98%    Last Pain:  Vitals:   02/04/21 0845  TempSrc: Oral  PainSc:                  Nelle Don Dequon Schnebly

## 2021-02-14 ENCOUNTER — Other Ambulatory Visit: Payer: Self-pay

## 2021-02-14 ENCOUNTER — Telehealth: Payer: Self-pay | Admitting: Nurse Practitioner

## 2021-02-14 ENCOUNTER — Ambulatory Visit: Payer: Medicaid Other | Attending: Nurse Practitioner | Admitting: Nurse Practitioner

## 2021-02-14 NOTE — Telephone Encounter (Signed)
No answer. LVM to return call to office 

## 2021-02-21 ENCOUNTER — Other Ambulatory Visit: Payer: Self-pay | Admitting: Surgery

## 2021-02-21 DIAGNOSIS — Z8719 Personal history of other diseases of the digestive system: Secondary | ICD-10-CM

## 2021-02-21 DIAGNOSIS — Z9889 Other specified postprocedural states: Secondary | ICD-10-CM

## 2021-04-05 ENCOUNTER — Telehealth: Payer: Self-pay | Admitting: Nurse Practitioner

## 2021-04-05 ENCOUNTER — Other Ambulatory Visit: Payer: Self-pay

## 2021-04-05 ENCOUNTER — Ambulatory Visit: Payer: Self-pay | Attending: Nurse Practitioner | Admitting: Nurse Practitioner

## 2021-04-05 NOTE — Telephone Encounter (Signed)
No answer. LVM to return call to office 

## 2021-08-27 IMAGING — CT CT ABD-PELV W/ CM
2 of 5 series · 15 of 46 positions shown, 17 images · IV contrast (omnipaque)
Comparison: None.

CLINICAL DATA: Palpable right groin mass

EXAM:
CT ABDOMEN AND PELVIS WITH CONTRAST
TECHNIQUE: Multidetector CT imaging of the abdomen and pelvis was performed
using the standard protocol following bolus administration of
intravenous contrast.
CONTRAST:  100mL OMNIPAQUE IOHEXOL 300 MG/ML  SOLN

[Series 3: abd/ pelvis 5.0 i30f 2 · axial · 0.79mm/px · z∈[-481,-61]mm · 12 of 94 slices shown, 14 images]
[im 5/94  soft-tissue]
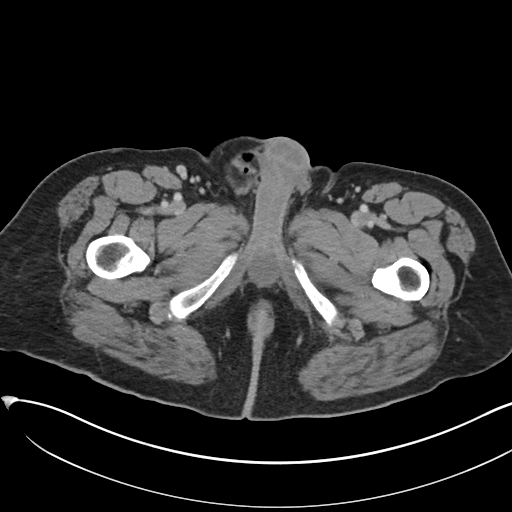
[im 5/94  bone]
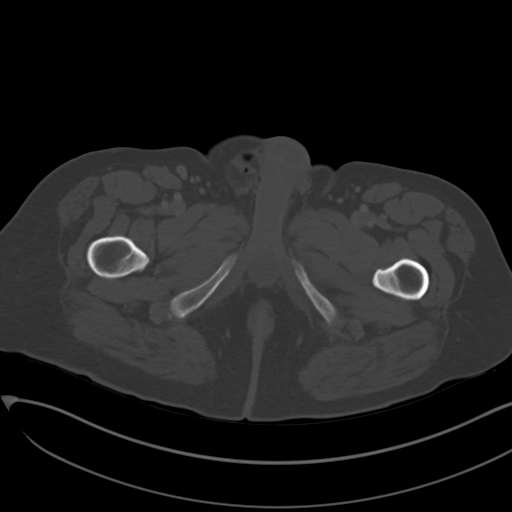
[im 15/94  soft-tissue]
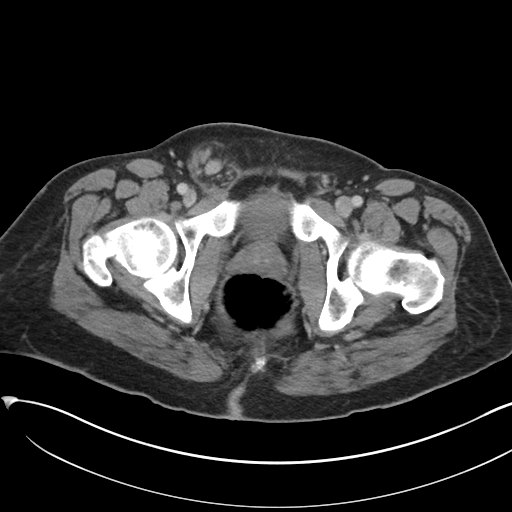
[im 20/94  soft-tissue]
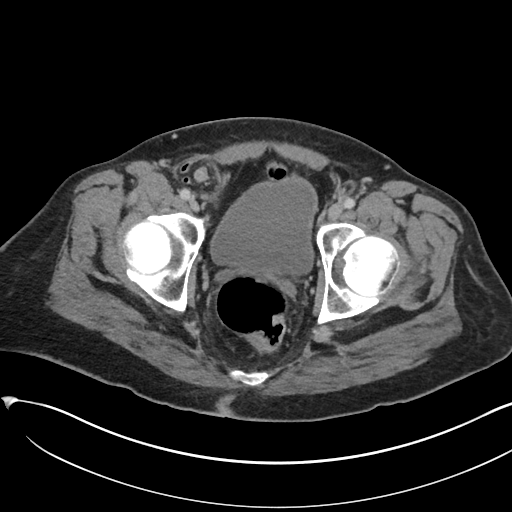
[im 30/94  soft-tissue]
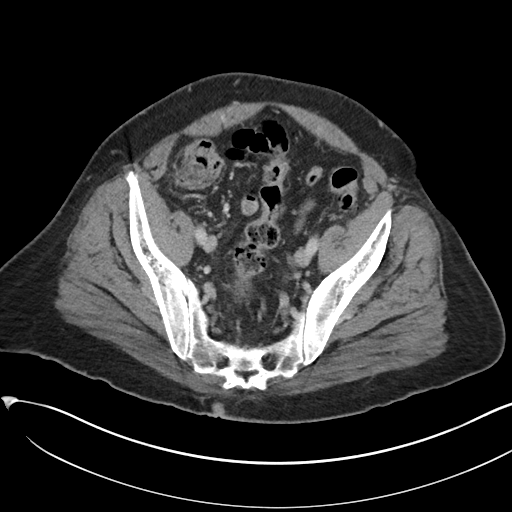
[im 35/94  soft-tissue]
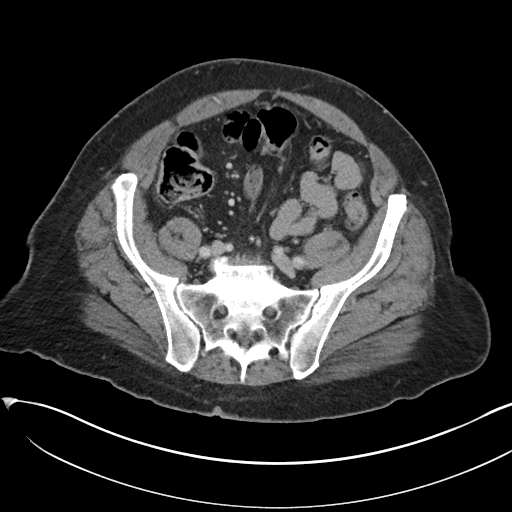
[im 45/94  soft-tissue]
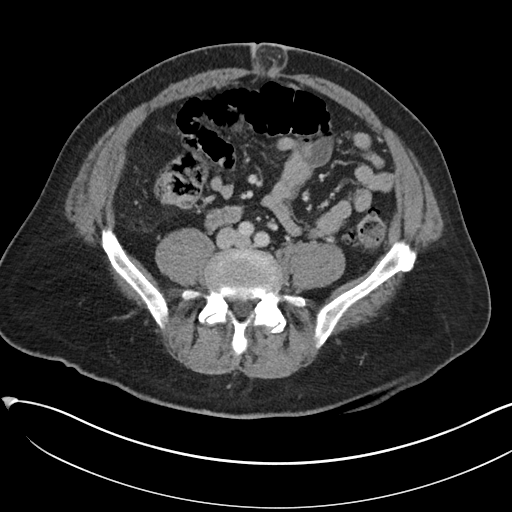
[im 49/94  soft-tissue]
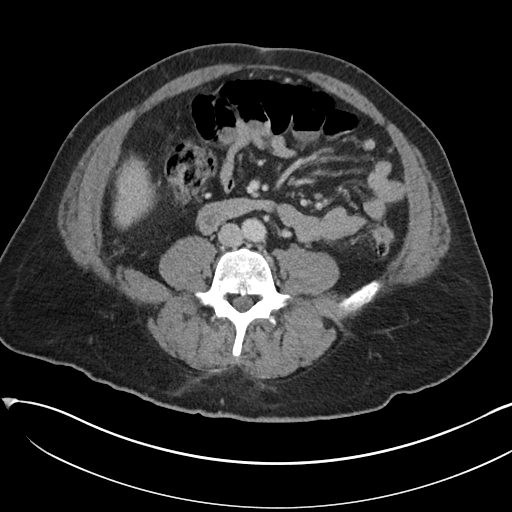
[im 59/94  soft-tissue]
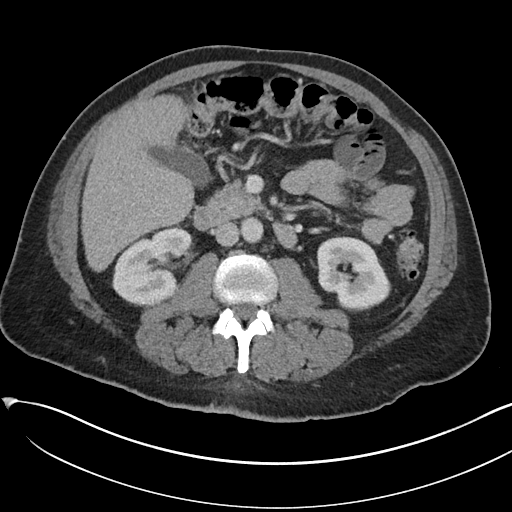
[im 64/94  soft-tissue]
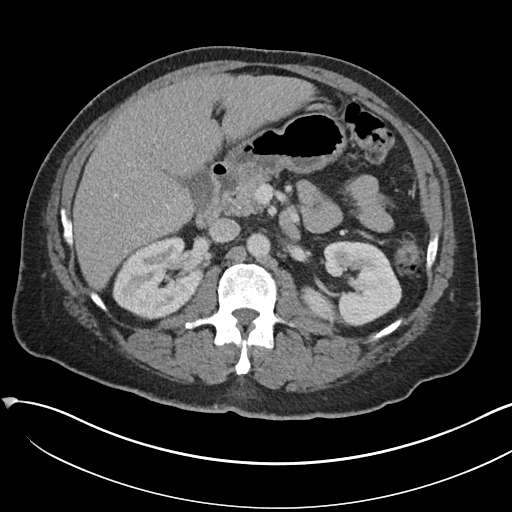
[im 64/94  bone]
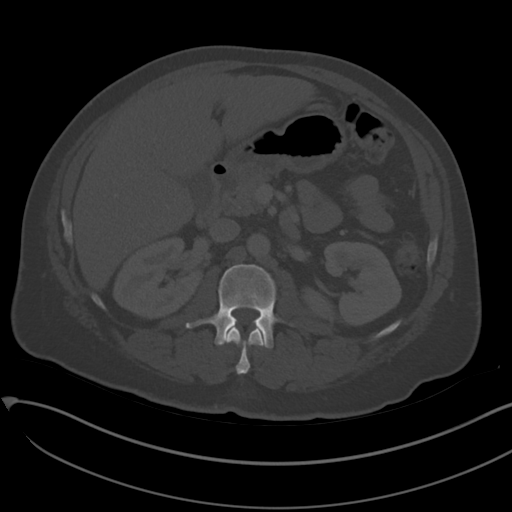
[im 74/94  soft-tissue]
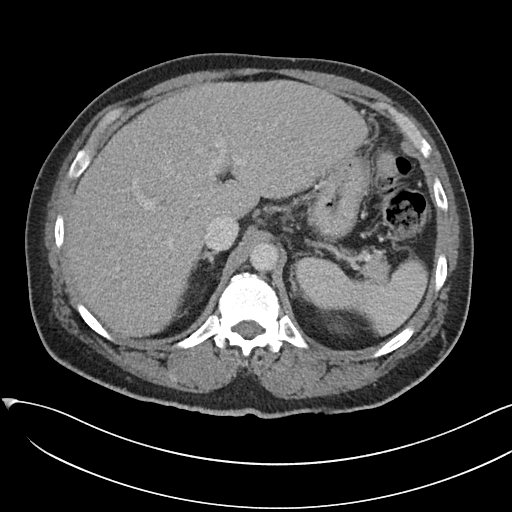
[im 79/94  soft-tissue]
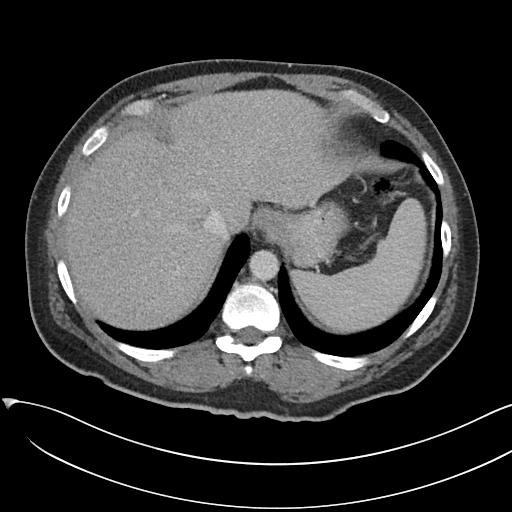
[im 89/94  soft-tissue]
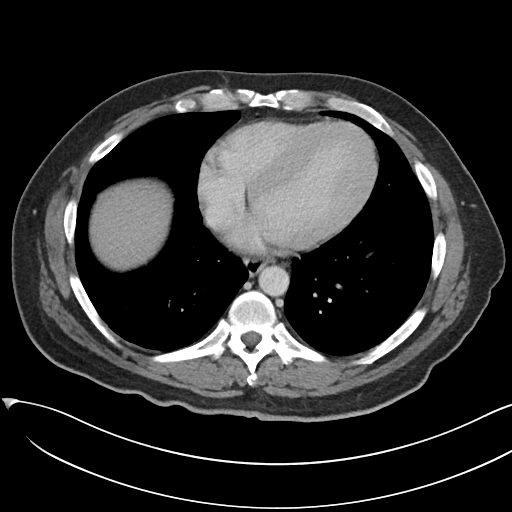

[Series 7: coronal soft tissue · coronal · 0.83mm/px · 3 of 100 slices shown]
[im 34/100  soft-tissue]
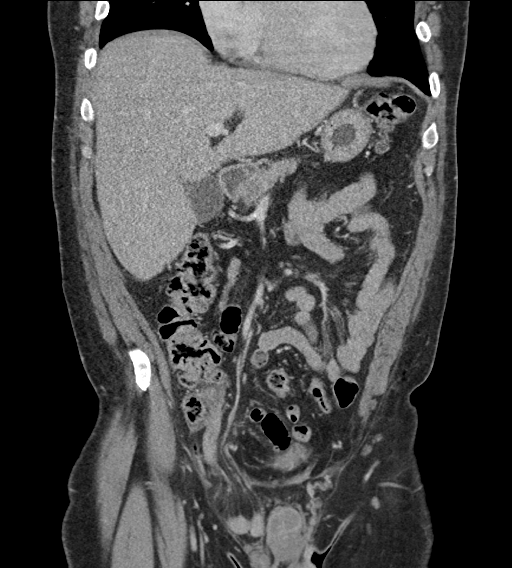
[im 45/100  soft-tissue]
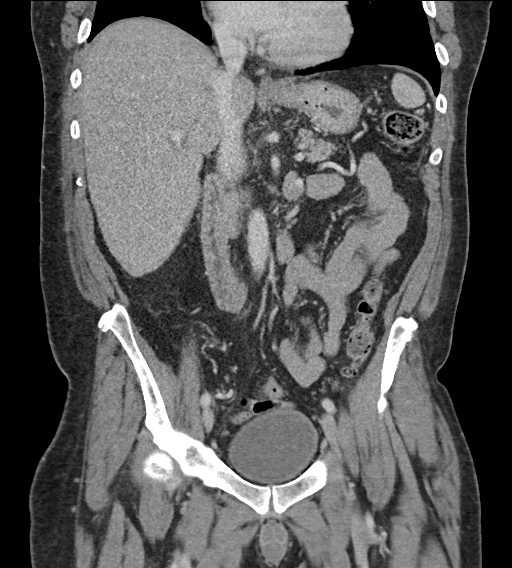
[im 56/100  soft-tissue]
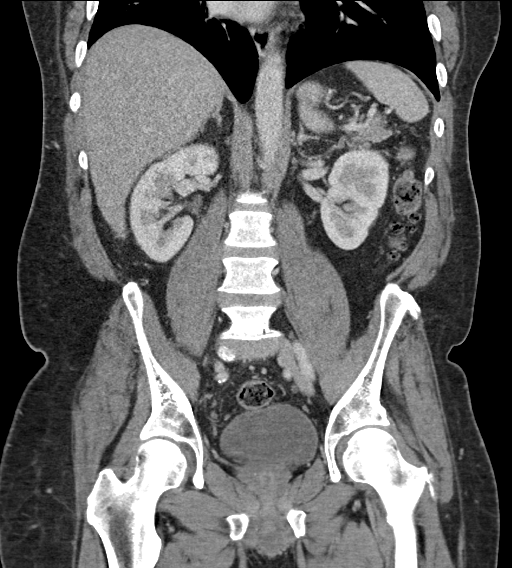

[15 of 46 positions shown; findings below may reference images not displayed]

FINDINGS: Lower chest: Lung bases are clear. Normal heart size. No pericardial
effusion.

Hepatobiliary: No focal liver abnormality is seen. No gallstones,
gallbladder wall thickening, or biliary dilatation.

Pancreas: Unremarkable. No pancreatic ductal dilatation or
surrounding inflammatory changes.

Spleen: Normal in size without focal abnormality.

Adrenals/Urinary Tract: Normal adrenal glands. Kidneys are normally
located with symmetric enhancement and excretion. No suspicious
renal lesion, urolithiasis or hydronephrosis. Urinary bladder is
unremarkable for the degree of distention.

Stomach/Bowel: Distal esophagus, stomach and duodenum are free of
acute abnormality. No small bowel thickening or dilatation. Portion
of the terminal ileum and cecum as well as a normal air-filled
appendix are contained within a right inguinal hernia. No evidence
of resulting mechanical bowel obstruction or bowel wall/vascular
compromise. More distal colonic segments are unremarkable without
wall thickening or dilatation. Scattered colonic diverticula without
focal pericolonic inflammation to suggest diverticulitis.

Vascular/Lymphatic: Minimal plaque in the right internal iliac
artery. No other significant vascular findings are present. No
enlarged abdominal or pelvic lymph nodes.

Reproductive: The prostate and seminal vesicles are unremarkable.

Other: Fat and bowel containing right inguinal hernia. Originates
medial to the origin of the inferior epigastric vessels with a
lateral fat crescent sign suggesting a direct type hernia with a
by 2.6 cm hernia neck (8/56, 3/73) Small fat containing left
inguinal hernia as well. A fat containing paraumbilical hernia is
noted as well just to the left of midline with a 9 x 9 mm fascial
defect. Minimal stranding within the herniated fat contents could
reflect a degree of mild strangulation. No abdominopelvic free fluid
or air.

Musculoskeletal: Multilevel degenerative changes are present in the
imaged portions of the spine. Retrolisthesis L5 on S1 with partially
calcified posterior disc protrusion and endplate spurring resulting
in at least moderate canal stenosis as well as severe bilateral
foraminal narrowing. No acute osseous abnormality or suspicious
osseous lesion.
IMPRESSION: 1. Fat and bowel containing right inguinal hernia. Appearance
suggest a direct inguinal hernia with a 2.9 by 2.6 cm hernia neck.
No evidence of resulting mechanical bowel obstruction or bowel
wall/vascular compromise involving the contained portions of the
terminal ileum, cecum and appendix.
2. A fat containing paraumbilical hernia is noted as well just to
the left of midline with a 9 x 9 mm fascial defect. Minimal
stranding within the herniated fat contents could reflect a degree
of mild strangulation. Correlate for point tenderness.
3. Multilevel degenerative changes in the imaged portions of the
spine. Retrolisthesis L5 on S1 with partially calcified posterior
disc protrusion and endplate spurring resulting in at least moderate
canal stenosis as well as severe bilateral foraminal narrowing.
4. Colonic diverticulosis without evidence of diverticulitis.

## 2021-09-14 ENCOUNTER — Other Ambulatory Visit: Payer: Self-pay

## 2021-09-14 ENCOUNTER — Other Ambulatory Visit: Payer: Self-pay | Admitting: Nurse Practitioner

## 2021-09-14 DIAGNOSIS — E785 Hyperlipidemia, unspecified: Secondary | ICD-10-CM

## 2021-09-14 DIAGNOSIS — I10 Essential (primary) hypertension: Secondary | ICD-10-CM

## 2021-09-14 NOTE — Telephone Encounter (Signed)
Requested medication (s) are due for refill today:   Yes  Requested medication (s) are on the active medication list:   Yes  Future visit scheduled:   No   Last ordered: Crestor 07/27/2020 #60, 0 refills;    losartan 07/27/2020 #60, 0 refills;   amlodipine 07/27/2020 #41, 0 refills   Returned because lab protocol criteria not met.       Requested Prescriptions  Pending Prescriptions Disp Refills   rosuvastatin (CRESTOR) 20 MG tablet 60 tablet 0    Sig: TAKE 1 TABLET (20 MG TOTAL) BY MOUTH DAILY.     Cardiovascular:  Antilipid - Statins 2 Failed - 09/14/2021  1:52 PM      Failed - Lipid Panel in normal range within the last 12 months    Cholesterol, Total  Date Value Ref Range Status  07/27/2020 146 100 - 199 mg/dL Final   LDL Chol Calc (NIH)  Date Value Ref Range Status  07/27/2020 73 0 - 99 mg/dL Final   HDL  Date Value Ref Range Status  07/27/2020 48 >39 mg/dL Final   Triglycerides  Date Value Ref Range Status  07/27/2020 146 0 - 149 mg/dL Final         Passed - Cr in normal range and within 360 days    Creat  Date Value Ref Range Status  07/30/2016 0.95 0.60 - 1.35 mg/dL Final   Creatinine, Ser  Date Value Ref Range Status  02/04/2021 0.88 0.61 - 1.24 mg/dL Final   Creatinine, Urine  Date Value Ref Range Status  07/24/2016 256 20 - 370 mg/dL Final          Passed - Patient is not pregnant      Passed - Valid encounter within last 12 months    Recent Outpatient Visits           10 months ago Primary hypertension   [place], Vernia Buff, NP   1 year ago Essential hypertension   [place], Vernia Buff, NP   2 years ago Essential hypertension   [place], Vernia Buff, NP   2 years ago Essential hypertension   [place], Vernia Buff, NP   2 years ago Essential hypertension   [place], [place] W, NP               losartan (COZAAR) 50 MG tablet 60 tablet 0    Sig: TAKE 1 TABLET (50 MG TOTAL) BY MOUTH DAILY.     Cardiovascular:  Angiotensin Receptor Blockers Failed - 09/14/2021  1:52 PM      Failed - Cr in normal range and within 180 days    Creat  Date Value Ref Range Status  07/30/2016 0.95 0.60 - 1.35 mg/dL Final   Creatinine, Ser  Date Value Ref Range Status  02/04/2021 0.88 0.61 - 1.24 mg/dL Final   Creatinine, Urine  Date Value Ref Range Status  07/24/2016 256 20 - 370 mg/dL Final          Failed - K in normal range and within 180 days    Potassium  Date Value Ref Range Status  02/04/2021 3.8 3.5 - 5.1 mmol/L Final          Passed - Patient is not pregnant      Passed - Last BP in normal range  BP Readings from Last 1 Encounters:  02/04/21 122/63          Passed - Valid encounter within last 6 months    Recent Outpatient Visits           10 months ago Primary hypertension   [place], Vernia Buff, NP   1 year ago Essential hypertension   [place], Vernia Buff, NP   2 years ago Essential hypertension   [place], Vernia Buff, NP   2 years ago Essential hypertension   [place], Vernia Buff, NP   2 years ago Essential hypertension   [place], NP               amLODipine (NORVASC) 10 MG tablet 41 tablet 0    Sig: TAKE 1 TABLET (10 MG TOTAL) BY MOUTH DAILY.     Cardiovascular: Calcium Channel Blockers 2 Passed - 09/14/2021  1:52 PM      Passed - Last BP in normal range    BP Readings from Last 1 Encounters:  02/04/21 122/63          Passed - Last Heart Rate in normal range    Pulse Readings from Last 1 Encounters:  02/04/21 91          Passed - Valid encounter within last 6 months    Recent Outpatient Visits            10 months ago Primary hypertension   [place], Vernia Buff, NP   1 year ago Essential hypertension   [place], Vernia Buff, NP   2 years ago Essential hypertension   [place], Vernia Buff, NP   2 years ago Essential hypertension   [place], Vernia Buff, NP   2 years ago Essential hypertension   [place], Vernia Buff, NP

## 2021-09-15 ENCOUNTER — Other Ambulatory Visit: Payer: Self-pay

## 2021-09-19 ENCOUNTER — Encounter: Payer: Self-pay | Admitting: Nurse Practitioner

## 2021-09-19 ENCOUNTER — Ambulatory Visit: Payer: Self-pay | Attending: Nurse Practitioner | Admitting: Nurse Practitioner

## 2021-09-19 DIAGNOSIS — D649 Anemia, unspecified: Secondary | ICD-10-CM

## 2021-09-19 DIAGNOSIS — E1165 Type 2 diabetes mellitus with hyperglycemia: Secondary | ICD-10-CM

## 2021-09-19 DIAGNOSIS — I10 Essential (primary) hypertension: Secondary | ICD-10-CM

## 2021-09-19 DIAGNOSIS — E781 Pure hyperglyceridemia: Secondary | ICD-10-CM

## 2021-09-19 DIAGNOSIS — Z1159 Encounter for screening for other viral diseases: Secondary | ICD-10-CM

## 2021-09-19 DIAGNOSIS — Z1211 Encounter for screening for malignant neoplasm of colon: Secondary | ICD-10-CM

## 2021-09-19 NOTE — Progress Notes (Signed)
Virtual Visit via Telephone Note Due to national recommendations of social distancing due to [place] 19, telehealth visit is felt to be most appropriate for this patient at this time.  I discussed the limitations, risks, security and privacy concerns of performing an evaluation and management service by telephone and the availability of in person appointments. I also discussed with the patient that there may be a patient responsible charge related to this service. The patient expressed understanding and agreed to proceed.    I connected with Victor [name] on 09/19/21  at   1:50 PM EST  EDT by telephone and verified that I am speaking with the correct person using two identifiers.  Location of Patient: Private Residence   Location of Provider: [place] [facility] [employer] Office    Persons participating in Telemedicine visit: [name] FNP-BC Izrael Peak    History of Present Illness: Telemedicine visit for: HTN and DM He has a past medical history of Dizziness (10/15/2013), Hypertension, Hypertriglyceridemia, and Obesity.   HTN Blood pressure is well controlled. He does not monitor at home. Current medications are expired and he is requesting refills today of his losartan 50 mg  and amlodipine 10 mg daily.  BP Readings from Last 3 Encounters:  02/04/21 122/63  07/27/20 128/75  12/31/19 (!) 157/84     DM 2 Diabetes is well controlled with diet only at this time. Highest A1c 6.5. He does not monitor glucose levels at home. LDL at [facility] Crestor 20 mg and lopid 600 mg BID. I will refill all medications once he has his labs drawn. He is aware of this.  Lab Results  Component Value Date   HGBA1C 5.6 07/27/2020   Lab Results  Component Value Date   [place] 73 07/27/2020        Past Medical History:  Diagnosis Date   Dizziness 10/15/2013    "SWIMMY HEAD    "   Hypertension    Hypertriglyceridemia    Obesity     Past Surgical History:  Procedure Laterality Date    INGUINAL HERNIA REPAIR Right 02/03/2021   Procedure: HERNIA REPAIR INGUINAL ADULT;  Surgeon: Jesusita Oka, MD;  Location: [place];  Service: General;  Laterality: Right;   INSERTION OF MESH Right 02/03/2021   Procedure: INSERTION OF MESH;  Surgeon: Jesusita Oka, MD;  Location: [facility];  Service: General;  Laterality: Right;   NO PAST SURGERIES      Family History  Problem Relation Age of Onset   CAD Mother        stenting @ 45; alive @ 60   Aneurysm Mother        thoracic s/p surgery @ 23   Lung disease Mother    Other Father        unknown    Social History   Socioeconomic History   Marital status: Single    Spouse name: Not on file   Number of children: Not on file   Years of education: Not on file   Highest education level: Not on file  Occupational History   Not on file  Tobacco Use   Smoking status: Never   Smokeless tobacco: Current    Types: Chew  Vaping Use   Vaping Use: Never used  Substance and Sexual Activity   Alcohol use: Yes    Comment: 4 12 oz beers twice/wk.   Drug use: No   Sexual activity: Yes  Other Topics Concern   Not on file  Social  History Narrative   Lives in [place].  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   Social Determinants of Health   Financial Resource Strain: Not on file  Food Insecurity: Not on file  Transportation Needs: Not on file  Physical Activity: Not on file  Stress: Not on file  Social Connections: Not on file     Observations/Objective: Awake, alert and oriented x 3   Review of Systems  Constitutional:  Negative for fever, malaise/fatigue and weight loss.  HENT: Negative.  Negative for nosebleeds.   Eyes: Negative.  Negative for blurred vision, double vision and photophobia.  Respiratory: Negative.  Negative for cough and shortness of breath.   Cardiovascular: Negative.  Negative for chest pain, palpitations and leg swelling.  Gastrointestinal: Negative.  Negative for heartburn, nausea and vomiting.   Musculoskeletal: Negative.  Negative for myalgias.  Neurological: Negative.  Negative for dizziness, focal weakness, seizures and headaches.  Psychiatric/Behavioral: Negative.  Negative for suicidal ideas.    Assessment and Plan: Diagnoses and all orders for this visit:  Primary hypertension -     CMP14+EGFR; Future  Hypertriglyceridemia -     Lipid panel; Future  Anemia, unspecified type -     CBC; Future  Type 2 diabetes mellitus with hyperglycemia, without long-term current use of insulin (HCC) -     CMP14+EGFR; Future -     Microalbumin / creatinine urine ratio; Future -     Hemoglobin A1c; Future Continue blood sugar control as discussed in office today, low carbohydrate diet, and regular physical exercise as tolerated, 150 minutes per week (30 min each day, 5 days per week, or 50 min 3 days per week). Keep blood sugar logs with fasting goal of 90-130 mg/dl, post prandial (after you eat) less than 180.  For Hypoglycemia: BS <60 and Hyperglycemia BS >400; contact the clinic ASAP. Annual eye exams and foot exams are recommended.   Colon cancer screening -     Fecal occult blood, imunochemical([facility]); Future  Need for hepatitis C screening test -     HCV Ab w Reflex to Quant PCR; Future     Follow Up Instructions Return in about 3 months (around 12/17/2021).     I discussed the assessment and treatment plan with the patient. The patient was provided an opportunity to ask questions and all were answered. The patient agreed with the plan and demonstrated an understanding of the instructions.   The patient was advised to call back or seek an in-person evaluation if the symptoms worsen or if the condition fails to improve as anticipated.  I provided 10 minutes of non-face-to-face time during this encounter including median intraservice time, reviewing previous notes, labs, imaging, medications and explaining diagnosis and management.  [name], FNP-BC

## 2022-06-22 DIAGNOSIS — Z419 Encounter for procedure for purposes other than remedying health state, unspecified: Secondary | ICD-10-CM | POA: Diagnosis not present

## 2022-07-23 DIAGNOSIS — Z419 Encounter for procedure for purposes other than remedying health state, unspecified: Secondary | ICD-10-CM | POA: Diagnosis not present

## 2022-08-23 DIAGNOSIS — Z419 Encounter for procedure for purposes other than remedying health state, unspecified: Secondary | ICD-10-CM | POA: Diagnosis not present

## 2022-09-21 DIAGNOSIS — Z419 Encounter for procedure for purposes other than remedying health state, unspecified: Secondary | ICD-10-CM | POA: Diagnosis not present

## 2022-10-01 IMAGING — CT CT ABD-PELV W/ CM
2 of 5 series · 15 of 46 positions shown, 17 images · IV contrast (omnipaque)
Comparison: CT abdomen pelvis dated 12/31/2019.

CLINICAL DATA: 49-year-old male with abdominal pain. Concern for
complicated hernia.

EXAM:
CT ABDOMEN AND PELVIS WITH CONTRAST
TECHNIQUE: Multidetector CT imaging of the abdomen and pelvis was performed
using the standard protocol following bolus administration of
intravenous contrast.
CONTRAST:  100mL OMNIPAQUE IOHEXOL 300 MG/ML  SOLN

[Series 3: abdomen 5.0 · axial · 0.97mm/px · z∈[+806,+1291]mm · 12 of 111 slices shown, 14 images]
[im 7/111  soft-tissue]
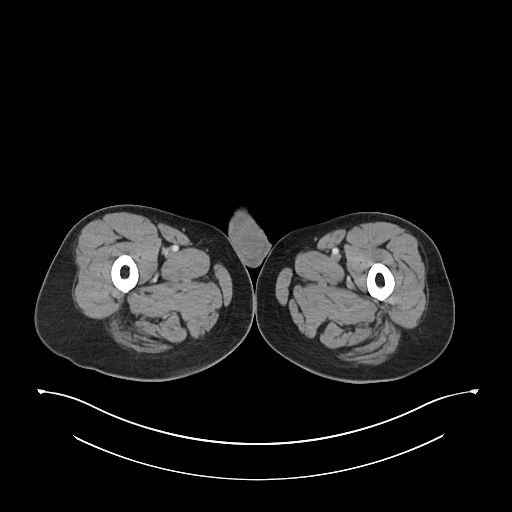
[im 7/111  bone]
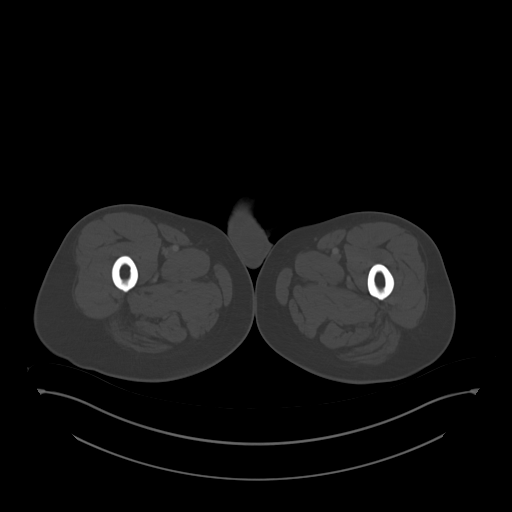
[im 14/111  soft-tissue]
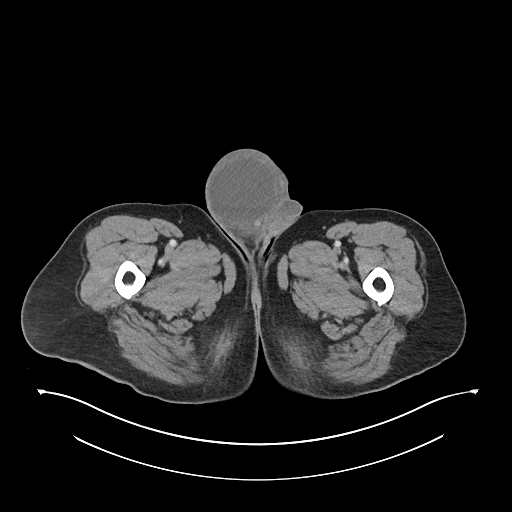
[im 28/111  soft-tissue]
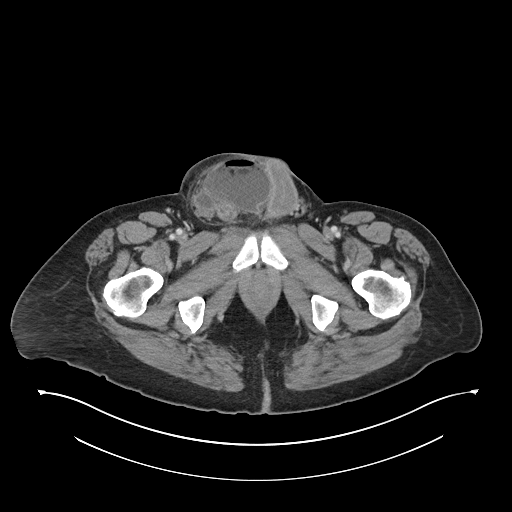
[im 35/111  soft-tissue]
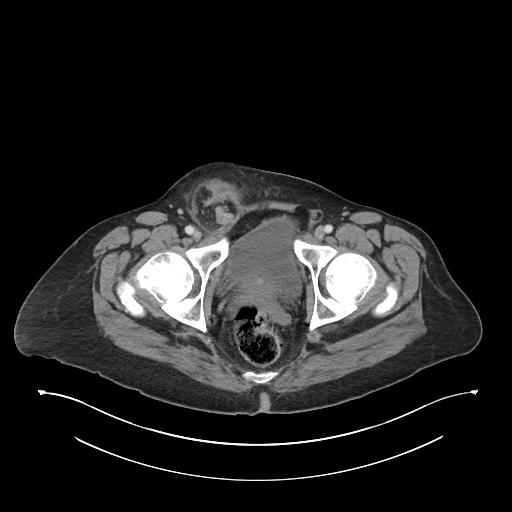
[im 42/111  soft-tissue]
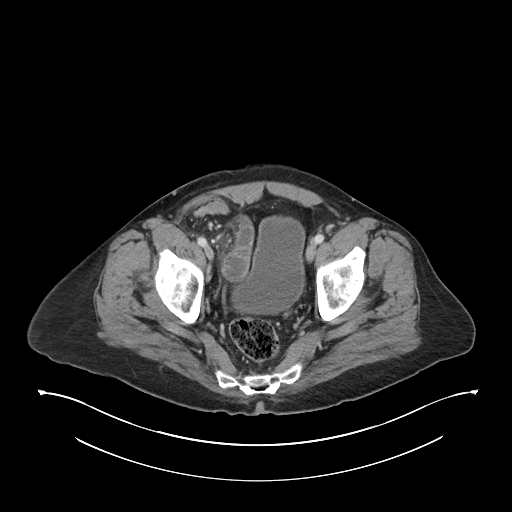
[im 49/111  soft-tissue]
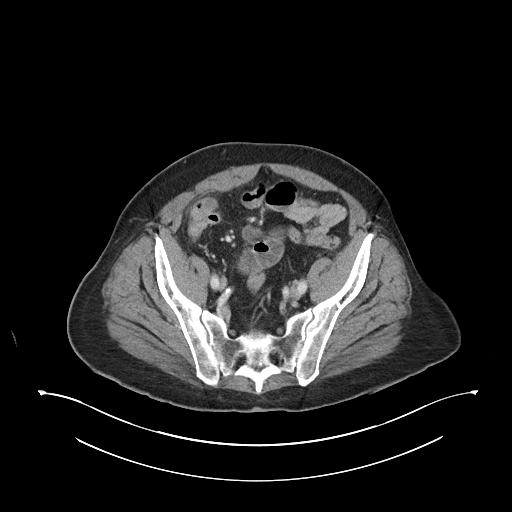
[im 62/111  soft-tissue]
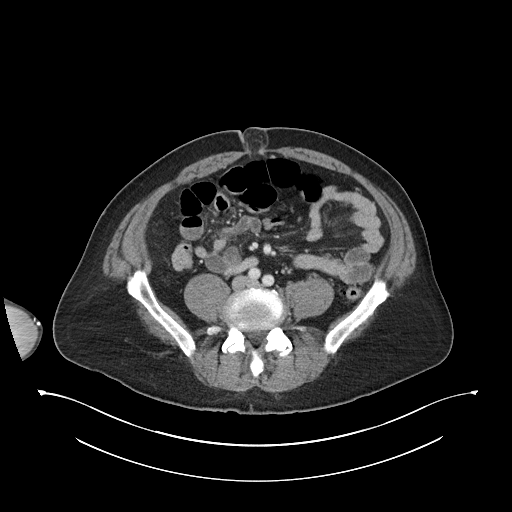
[im 69/111  soft-tissue]
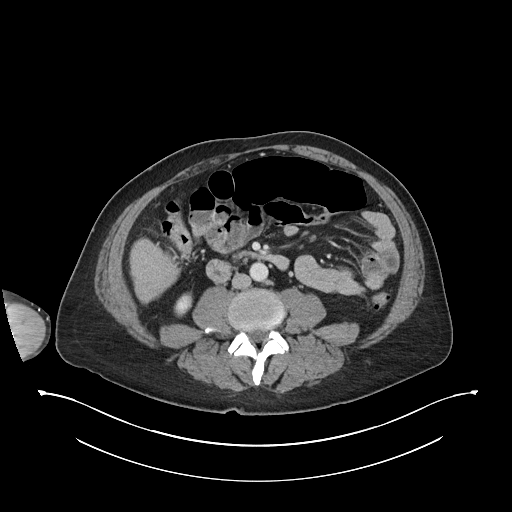
[im 76/111  soft-tissue]
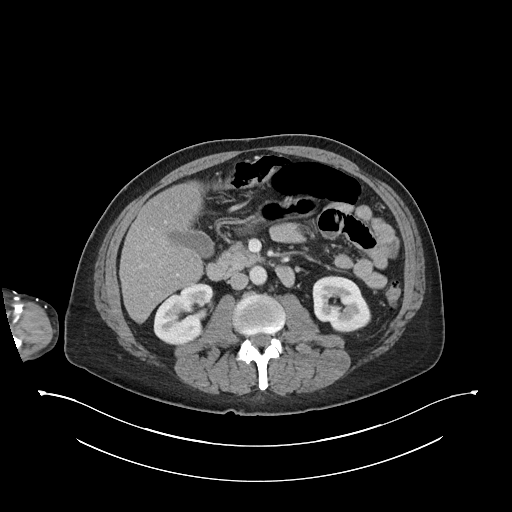
[im 76/111  bone]
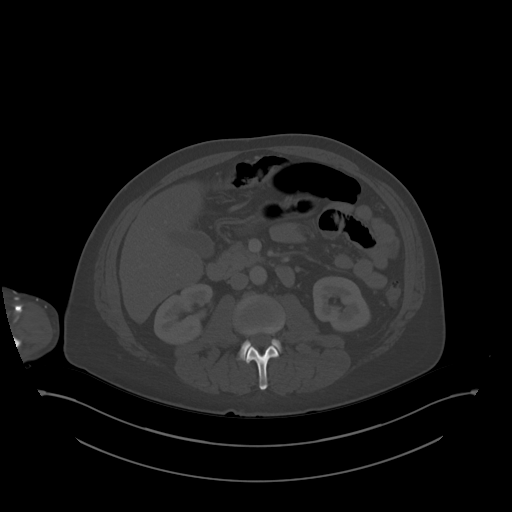
[im 83/111  soft-tissue]
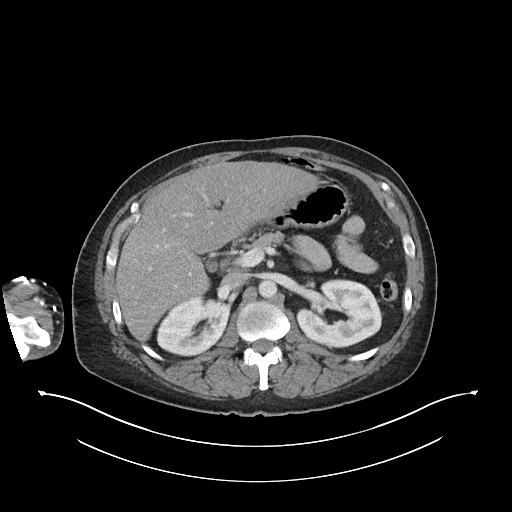
[im 97/111  soft-tissue]
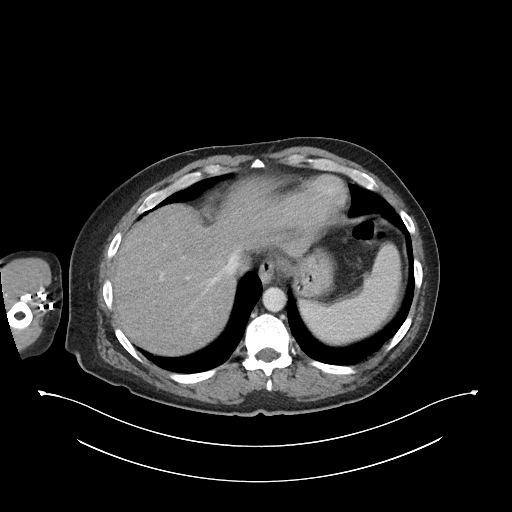
[im 104/111  soft-tissue]
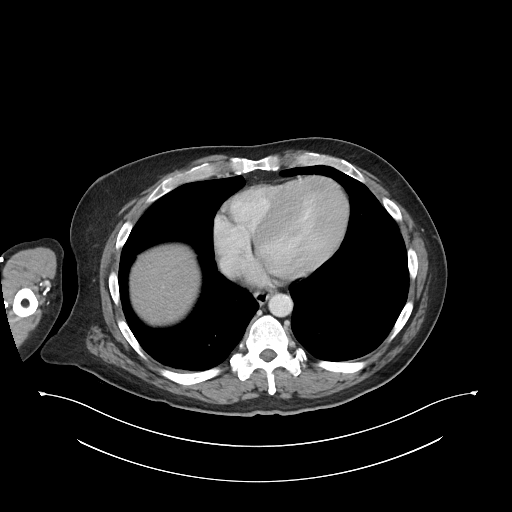

[Series 6: abdomen 3.0 mpr cor · coronal · 0.80mm/px · 3 of 96 slices shown]
[im 32/96  soft-tissue]
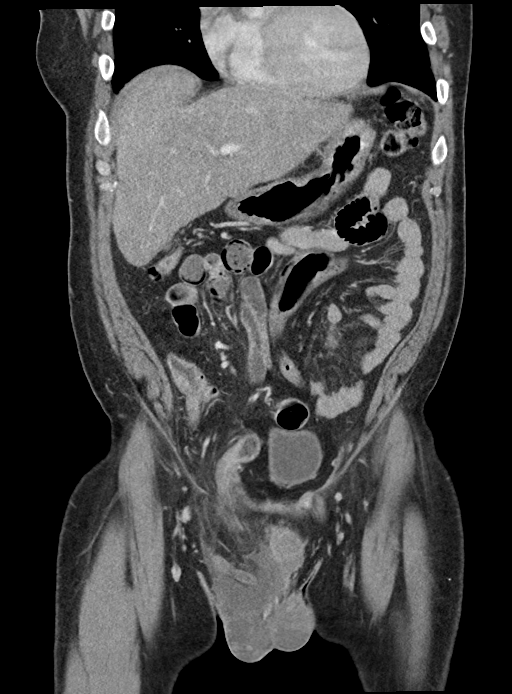
[im 43/96  soft-tissue]
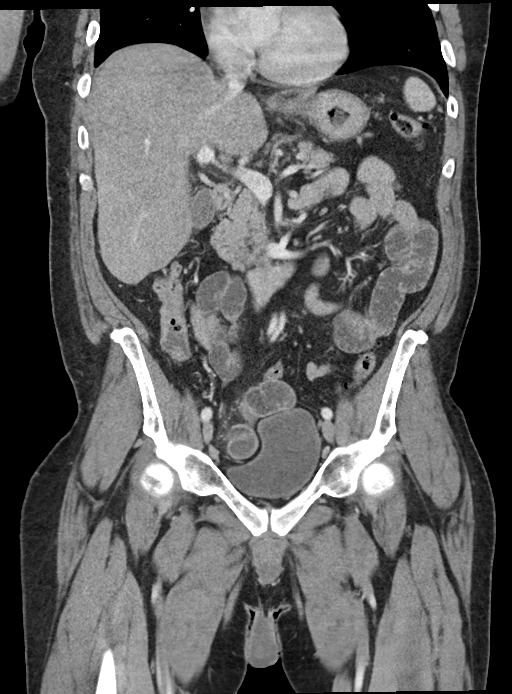
[im 53/96  soft-tissue]
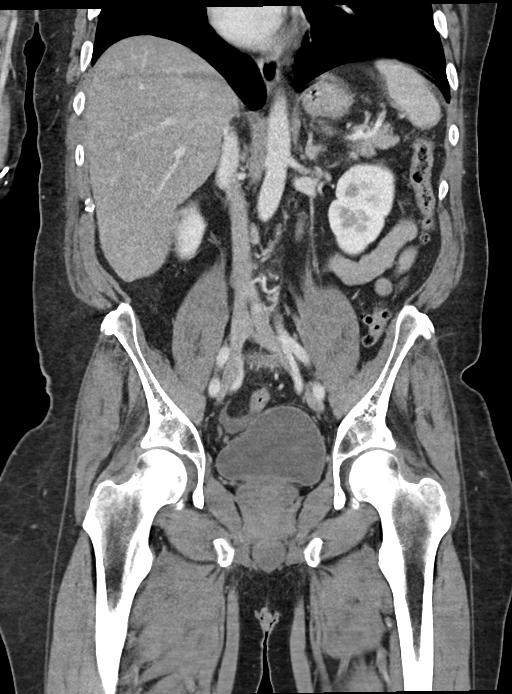

[15 of 46 positions shown; findings below may reference images not displayed]

FINDINGS: Lower chest: The visualized lung bases are clear. There is coronary
vascular calcification.

No intra-abdominal free air or free fluid.

Hepatobiliary: Fatty liver. There is irregularity of the liver
contour suspicious for early changes of cirrhosis. No intrahepatic
biliary dilatation. The gallbladder is unremarkable.

Pancreas: Unremarkable. No pancreatic ductal dilatation or
surrounding inflammatory changes.

Spleen: Normal in size without focal abnormality.

Adrenals/Urinary Tract: The adrenal glands are unremarkable. There
is no hydronephrosis on either side. There is symmetric enhancement
and excretion of contrast by both kidneys. The visualized ureters
and urinary bladder appear unremarkable.

Stomach/Bowel: There is herniation of the cecum, portion of the
terminal ileum, and ileocecal bowel as well as the appendix into the
right inguinal canal. There is a 3 mm stone in the distal lumen of
the appendix. There is compression of the terminal ileum at the neck
of the hernia defect with mild or low-grade obstruction. There is
inflammatory changes of the terminal ileum and cecum. Small amount
of fluid noted within the right inguinal hernia. The appendix
however appears unremarkable.

There is mild diffuse edema of the mesentery, likely reactive to
inflammatory changes of the terminal ileum.

Vascular/Lymphatic: The abdominal aorta and IVC unremarkable. No
portal venous gas. There is no adenopathy.

Reproductive: The prostate and seminal vesicles are grossly
unremarkable. No pelvic mass.

Other: There is a small fat containing umbilical hernia.

Musculoskeletal: Degenerative changes of the spine. No acute osseous
pathology.
IMPRESSION: 1. Herniation of the cecum, portion of the terminal ileum, and the
appendix into the right inguinal canal. There is inflammatory
changes of the terminal ileum with early or low grade obstruction
secondary to compression at the neck of the hernia.
2. Fatty liver with probable early cirrhosis.
3. Aortic Atherosclerosis (9VLAM-D41.1).

## 2022-10-08 ENCOUNTER — Other Ambulatory Visit: Payer: Self-pay

## 2022-10-08 ENCOUNTER — Telehealth: Payer: Self-pay | Admitting: Nurse Practitioner

## 2022-10-08 ENCOUNTER — Other Ambulatory Visit: Payer: Self-pay | Admitting: Nurse Practitioner

## 2022-10-08 ENCOUNTER — Ambulatory Visit: Payer: Self-pay

## 2022-10-08 DIAGNOSIS — E781 Pure hyperglyceridemia: Secondary | ICD-10-CM

## 2022-10-08 DIAGNOSIS — E785 Hyperlipidemia, unspecified: Secondary | ICD-10-CM

## 2022-10-08 DIAGNOSIS — I10 Essential (primary) hypertension: Secondary | ICD-10-CM

## 2022-10-08 NOTE — Telephone Encounter (Signed)
Pt is experiencing dizziness and blurred vision, says he is out of his blood pressure medication.   Chief Complaint: Dizziness, has been out of BP x 1 month. Has not taken BP. Symptoms: Above Frequency:  1 week ago Pertinent Negatives: Patient denies  Disposition: [] ED /[] Urgent Care (no appt availability in office) / [] Appointment(In office/virtual)/ []  [facility]/ [] Home Care/ [] Refused Recommended Disposition /[x] [facility]/ []  Follow-up with PCP Additional Notes:   Reason for Disposition  [1] MODERATE dizziness (e.g., interferes with normal activities) AND [2] has NOT been evaluated by doctor (or NP/PA) for this  (Exception: Dizziness caused by heat exposure, sudden standing, or poor fluid intake.)  Answer Assessment - Initial Assessment Questions 1. DESCRIPTION: "Describe your dizziness."     Dizzy 2. LIGHTHEADED: "Do you feel lightheaded?" (e.g., somewhat faint, woozy, weak upon standing)     Yes 3. VERTIGO: "Do you feel like either you or the room is spinning or tilting?" (i.e. vertigo)     Yes 4. SEVERITY: "How bad is it?"  "Do you feel like you are going to faint?" "Can you stand and walk?"   - MILD: Feels slightly dizzy, but walking normally.   - MODERATE: Feels unsteady when walking, but not falling; interferes with normal activities (e.g., school, work).   - SEVERE: Unable to walk without falling, or requires assistance to walk without falling; feels like passing out now.      Mild 5. ONSET:  "When did the dizziness begin?"     1 week 6. AGGRAVATING FACTORS: "Does anything make it worse?" (e.g., standing, change in head position)     Walking 7. HEART RATE: "Can you tell me your heart rate?" "How many beats in 15 seconds?"  (Note: not all patients can do this)       Yes 8. CAUSE: "What do you think is causing the dizziness?"     Has been out of BP medicine 9. RECURRENT SYMPTOM: "Have you had dizziness before?" If Yes, ask: "When was the last time?"  "What happened that time?"     No 10. OTHER SYMPTOMS: "Do you have any other symptoms?" (e.g., fever, chest pain, vomiting, diarrhea, bleeding)       No 11. PREGNANCY: "Is there any chance you are pregnant?" "When was your last menstrual period?"       N/A  Protocols used: Dizziness - Lightheadedness-A-AH

## 2022-10-08 NOTE — Telephone Encounter (Signed)
Disregard

## 2022-10-12 ENCOUNTER — Other Ambulatory Visit: Payer: Self-pay | Admitting: Nurse Practitioner

## 2022-10-12 ENCOUNTER — Other Ambulatory Visit: Payer: Self-pay

## 2022-10-12 DIAGNOSIS — E781 Pure hyperglyceridemia: Secondary | ICD-10-CM

## 2022-10-12 DIAGNOSIS — I10 Essential (primary) hypertension: Secondary | ICD-10-CM

## 2022-10-12 DIAGNOSIS — E785 Hyperlipidemia, unspecified: Secondary | ICD-10-CM

## 2022-10-15 ENCOUNTER — Other Ambulatory Visit: Payer: Self-pay

## 2022-10-15 MED ORDER — GEMFIBROZIL 600 MG PO TABS
600.0000 mg | ORAL_TABLET | Freq: Two times a day (BID) | ORAL | 0 refills | Status: DC
  Filled 2022-10-15: qty 60, 30d supply, fill #0

## 2022-10-15 MED ORDER — AMLODIPINE BESYLATE 10 MG PO TABS
10.0000 mg | ORAL_TABLET | Freq: Every day | ORAL | 0 refills | Status: DC
  Filled 2022-10-15: qty 30, 30d supply, fill #0

## 2022-10-15 MED ORDER — LOSARTAN POTASSIUM 50 MG PO TABS
50.0000 mg | ORAL_TABLET | Freq: Every day | ORAL | 0 refills | Status: DC
  Filled 2022-10-15: qty 30, 30d supply, fill #0

## 2022-10-15 MED ORDER — ROSUVASTATIN CALCIUM 20 MG PO TABS
20.0000 mg | ORAL_TABLET | Freq: Every day | ORAL | 0 refills | Status: DC
  Filled 2022-10-15: qty 30, 30d supply, fill #0

## 2022-10-15 NOTE — Telephone Encounter (Signed)
Requested medications are due for refill today.  yes  Requested medications are on the active medications list.  yes  Last refill. All refilled 07/27/2020  Future visit scheduled.   yes  Notes to clinic.  Labs are expired. All rx's are expired.    Requested Prescriptions  Pending Prescriptions Disp Refills   rosuvastatin (CRESTOR) 20 MG tablet 60 tablet 0    Sig: TAKE 1 TABLET (20 MG TOTAL) BY MOUTH DAILY.     Cardiovascular:  Antilipid - Statins 2 Failed - 10/12/2022  4:41 PM      Failed - Cr in normal range and within 360 days    Creat  Date Value Ref Range Status  07/30/2016 0.95 0.60 - 1.35 mg/dL Final   Creatinine, Ser  Date Value Ref Range Status  02/04/2021 0.88 0.61 - 1.24 mg/dL Final   Creatinine, Urine  Date Value Ref Range Status  07/24/2016 256 20 - 370 mg/dL Final         Failed - Valid encounter within last 12 months    Recent Outpatient Visits           1 year ago Primary hypertension   [place], Vernia Buff, NP   1 year ago Primary hypertension   [place], Vernia Buff, NP   2 years ago Essential hypertension   [place], Vernia Buff, NP   3 years ago Essential hypertension   [place], [place], NP   3 years ago Essential hypertension   [place], Vernia Buff, NP       Future Appointments             In 1 month [name], NP [place]            Failed - Lipid Panel in normal range within the last 12 months    Cholesterol, Total  Date Value Ref Range Status  07/27/2020 146 100 - 199 mg/dL Final   LDL Chol Calc (NIH)  Date Value Ref Range Status  07/27/2020 73 0 - 99 mg/dL Final   HDL  Date Value Ref Range Status  07/27/2020 48 >39 mg/dL Final   Triglycerides  Date Value Ref Range  Status  07/27/2020 146 0 - 149 mg/dL Final         Passed - Patient is not pregnant       losartan (COZAAR) 50 MG tablet 60 tablet 0    Sig: TAKE 1 TABLET (50 MG TOTAL) BY MOUTH DAILY.     Cardiovascular:  Angiotensin Receptor Blockers Failed - 10/12/2022  4:41 PM      Failed - Cr in normal range and within 180 days    Creat  Date Value Ref Range Status  07/30/2016 0.95 0.60 - 1.35 mg/dL Final   Creatinine, Ser  Date Value Ref Range Status  02/04/2021 0.88 0.61 - 1.24 mg/dL Final   Creatinine, Urine  Date Value Ref Range Status  07/24/2016 256 20 - 370 mg/dL Final         Failed - K in normal range and within 180 days    Potassium  Date Value Ref Range Status  02/04/2021 3.8 3.5 - 5.1 mmol/L Final         Failed - Valid encounter within last 6 months  Recent Outpatient Visits           1 year ago Primary hypertension   [place], Vernia Buff, NP   1 year ago Primary hypertension   [place], Vernia Buff, NP   2 years ago Essential hypertension   [place], Vernia Buff, NP   3 years ago Essential hypertension   [place], Vernia Buff, NP   3 years ago Essential hypertension   [place] [name], NP       Future Appointments             In 1 month [name], NP [place] - Patient is not pregnant      Passed - Last BP in normal range    BP Readings from Last 1 Encounters:  02/04/21 122/63          gemfibrozil (LOPID) 600 MG tablet 60 tablet 3    Sig: TAKE 1 TABLET BY MOUTH 2 TIMES DAILY BEFORE A MEAL.     Cardiovascular:  Antilipid - Fibric Acid Derivatives Failed - 10/12/2022  4:41 PM      Failed - ALT in normal range and within 360 days    ALT  Date Value Ref Range Status   02/02/2021 46 (H) 0 - 44 U/L Final         Failed - AST in normal range and within 360 days    AST  Date Value Ref Range Status  02/02/2021 75 (H) 15 - 41 U/L Final         Failed - Cr in normal range and within 360 days    Creat  Date Value Ref Range Status  07/30/2016 0.95 0.60 - 1.35 mg/dL Final   Creatinine, Ser  Date Value Ref Range Status  02/04/2021 0.88 0.61 - 1.24 mg/dL Final   Creatinine, Urine  Date Value Ref Range Status  07/24/2016 256 20 - 370 mg/dL Final         Failed - HGB in normal range and within 360 days    Hemoglobin  Date Value Ref Range Status  02/04/2021 10.8 (L) 13.0 - 17.0 g/dL Final  07/27/2020 11.6 (L) 13.0 - 17.7 g/dL Final         Failed - HCT in normal range and within 360 days    HCT  Date Value Ref Range Status  02/04/2021 33.3 (L) 39.0 - 52.0 % Final   Hematocrit  Date Value Ref Range Status  07/27/2020 35.8 (L) 37.5 - 51.0 % Final         Failed - PLT in normal range and within 360 days    Platelets  Date Value Ref Range Status  02/04/2021 84 (L) 150 - 400 K/uL Final    Comment:    Immature Platelet Fraction may be clinically indicated, consider ordering this additional test [id number] CONSISTENT WITH PREVIOUS RESULT REPEATED TO VERIFY   07/27/2020 177 150 - 450 x10E3/uL Final         Failed - WBC in normal range and within 360 days    WBC  Date Value Ref Range Status  02/04/2021 9.8 4.0 - 10.5 K/uL Final         Failed - eGFR  is 30 or above and within 360 days    GFR, Est African American  Date Value Ref Range Status  12/27/2014 >89 mL/min Final   GFR calc Af Amer  Date Value Ref Range Status  07/27/2020 108 >59 mL/min/1.73 Final    Comment:    **In accordance with recommendations from the NKF-ASN Task force,**   [facility] is in the process of updating its eGFR calculation to the   2021 CKD-EPI creatinine equation that estimates kidney function   without a race variable.    GFR, Est Non African American   Date Value Ref Range Status  12/27/2014 >89 mL/min Final    Comment:      The estimated GFR is a calculation valid for adults (>=53 years old) that uses the CKD-EPI algorithm to adjust for age and sex. It is   not to be used for children, pregnant women, hospitalized patients,    patients on dialysis, or with rapidly changing kidney function. According to the NKDEP, eGFR >89 is normal, 60-89 shows mild impairment, 30-59 shows moderate impairment, 15-29 shows severe impairment and <15 is ESRD.      GFR, Estimated  Date Value Ref Range Status  02/04/2021 >60 >60 mL/min Final    Comment:    (NOTE) Calculated using the CKD-EPI Creatinine Equation (2021)          Failed - Valid encounter within last 12 months    Recent Outpatient Visits           1 year ago Primary hypertension   [place], Vernia Buff, NP   1 year ago Primary hypertension   [place], Vernia Buff, NP   2 years ago Essential hypertension   [place], Vernia Buff, NP   3 years ago Essential hypertension   [place], [place] W, NP   3 years ago Essential hypertension   [place], Vernia Buff, NP       Future Appointments             In 1 month [name], NP [place]            Failed - Lipid Panel in normal range within the last 12 months    Cholesterol, Total  Date Value Ref Range Status  07/27/2020 146 100 - 199 mg/dL Final   LDL Chol Calc (NIH)  Date Value Ref Range Status  07/27/2020 73 0 - 99 mg/dL Final   HDL  Date Value Ref Range Status  07/27/2020 48 >39 mg/dL Final   Triglycerides  Date Value Ref Range Status  07/27/2020 146 0 - 149 mg/dL Final          amLODipine (NORVASC) 10 MG tablet 41 tablet 0    Sig: TAKE 1 TABLET (10 MG  TOTAL) BY MOUTH DAILY.     Cardiovascular: Calcium Channel Blockers 2 Failed - 10/12/2022  4:41 PM      Failed - Valid encounter within last 6 months    Recent Outpatient Visits           1 year ago Primary hypertension   [place], Vernia Buff, NP   1 year ago Primary hypertension   [place], Vernia Buff, NP  2 years ago Essential hypertension   [place], Vernia Buff, NP   3 years ago Essential hypertension   [place], Vernia Buff, NP   3 years ago Essential hypertension   [place] [name], NP       Future Appointments             In 1 month [name], NP [place] BP in normal range    BP Readings from Last 1 Encounters:  02/04/21 122/63         Passed - Last Heart Rate in normal range    Pulse Readings from Last 1 Encounters:  02/04/21 91

## 2022-10-16 ENCOUNTER — Other Ambulatory Visit: Payer: Self-pay

## 2022-10-19 ENCOUNTER — Other Ambulatory Visit: Payer: Self-pay

## 2022-10-22 DIAGNOSIS — Z419 Encounter for procedure for purposes other than remedying health state, unspecified: Secondary | ICD-10-CM | POA: Diagnosis not present

## 2022-11-14 ENCOUNTER — Ambulatory Visit: Payer: Medicaid Other | Admitting: Nurse Practitioner

## 2022-11-21 DIAGNOSIS — Z419 Encounter for procedure for purposes other than remedying health state, unspecified: Secondary | ICD-10-CM | POA: Diagnosis not present

## 2022-12-22 DIAGNOSIS — Z419 Encounter for procedure for purposes other than remedying health state, unspecified: Secondary | ICD-10-CM | POA: Diagnosis not present

## 2023-03-19 ENCOUNTER — Ambulatory Visit: Payer: Medicaid Other | Admitting: Nurse Practitioner

## 2023-03-22 ENCOUNTER — Emergency Department (HOSPITAL_COMMUNITY): Payer: Medicaid Other

## 2023-03-22 ENCOUNTER — Encounter (HOSPITAL_COMMUNITY): Payer: Self-pay | Admitting: *Deleted

## 2023-03-22 ENCOUNTER — Other Ambulatory Visit: Payer: Self-pay

## 2023-03-22 ENCOUNTER — Emergency Department (HOSPITAL_COMMUNITY)
Admission: EM | Admit: 2023-03-22 | Discharge: 2023-03-23 | Disposition: A | Discharge status: Home / Self Care | Payer: Medicaid Other

## 2023-03-22 DIAGNOSIS — E785 Hyperlipidemia, unspecified: Secondary | ICD-10-CM

## 2023-03-22 DIAGNOSIS — R4182 Altered mental status, unspecified: Secondary | ICD-10-CM | POA: Diagnosis not present

## 2023-03-22 DIAGNOSIS — I10 Essential (primary) hypertension: Secondary | ICD-10-CM | POA: Diagnosis not present

## 2023-03-22 DIAGNOSIS — Z79899 Other long term (current) drug therapy: Secondary | ICD-10-CM | POA: Diagnosis not present

## 2023-03-22 DIAGNOSIS — R600 Localized edema: Secondary | ICD-10-CM | POA: Insufficient documentation

## 2023-03-22 DIAGNOSIS — Z7982 Long term (current) use of aspirin: Secondary | ICD-10-CM | POA: Insufficient documentation

## 2023-03-22 DIAGNOSIS — R42 Dizziness and giddiness: Secondary | ICD-10-CM | POA: Insufficient documentation

## 2023-03-22 DIAGNOSIS — R079 Chest pain, unspecified: Secondary | ICD-10-CM | POA: Insufficient documentation

## 2023-03-22 DIAGNOSIS — E781 Pure hyperglyceridemia: Secondary | ICD-10-CM

## 2023-03-22 DIAGNOSIS — M7989 Other specified soft tissue disorders: Secondary | ICD-10-CM | POA: Diagnosis not present

## 2023-03-22 LAB — HEPATIC FUNCTION PANEL
ALT: 46 U/L — ABNORMAL HIGH (ref 0–44)
AST: 96 U/L — ABNORMAL HIGH (ref 15–41)
Albumin: 3 g/dL — ABNORMAL LOW (ref 3.5–5.0)
Alkaline Phosphatase: 64 U/L (ref 38–126)
Bilirubin, Direct: 0.3 mg/dL — ABNORMAL HIGH (ref 0.0–0.2)
Indirect Bilirubin: 0.6 mg/dL (ref 0.3–0.9)
Total Bilirubin: 0.9 mg/dL (ref 0.3–1.2)
Total Protein: 6.5 g/dL (ref 6.5–8.1)

## 2023-03-22 LAB — CBC
HCT: 34.4 % — ABNORMAL LOW (ref 39.0–52.0)
Hemoglobin: 11.5 g/dL — ABNORMAL LOW (ref 13.0–17.0)
MCH: 37 pg — ABNORMAL HIGH (ref 26.0–34.0)
MCHC: 33.4 g/dL (ref 30.0–36.0)
MCV: 110.6 fL — ABNORMAL HIGH (ref 80.0–100.0)
Platelets: 74 10*3/uL — ABNORMAL LOW (ref 150–400)
RBC: 3.11 MIL/uL — ABNORMAL LOW (ref 4.22–5.81)
RDW: 16.1 % — ABNORMAL HIGH (ref 11.5–15.5)
WBC: 3.8 10*3/uL — ABNORMAL LOW (ref 4.0–10.5)
nRBC: 0.8 % — ABNORMAL HIGH (ref 0.0–0.2)

## 2023-03-22 LAB — BASIC METABOLIC PANEL
Anion gap: 9 (ref 5–15)
BUN: 17 mg/dL (ref 6–20)
CO2: 21 mmol/L — ABNORMAL LOW (ref 22–32)
Calcium: 8.3 mg/dL — ABNORMAL LOW (ref 8.9–10.3)
Chloride: 104 mmol/L (ref 98–111)
Creatinine, Ser: 1.06 mg/dL (ref 0.61–1.24)
GFR, Estimated: >60 mL/min (ref >60)
Glucose, Bld: 118 mg/dL — ABNORMAL HIGH (ref 70–99)
Potassium: 3.5 mmol/L (ref 3.5–5.1)
Sodium: 134 mmol/L — ABNORMAL LOW (ref 135–145)

## 2023-03-22 LAB — TROPONIN I (HIGH SENSITIVITY)
Troponin I (High Sensitivity): 38 ng/L — ABNORMAL HIGH (ref <18)
Troponin I (High Sensitivity): 39 ng/L — ABNORMAL HIGH (ref <18)

## 2023-03-22 LAB — AMMONIA: Ammonia: 44 umol/L — ABNORMAL HIGH (ref 9–35)

## 2023-03-22 LAB — ETHANOL: Alcohol, Ethyl (B): <10 mg/dL (ref <10)

## 2023-03-22 LAB — CBG MONITORING, ED: Glucose-Capillary: 111 mg/dL — ABNORMAL HIGH (ref 70–99)

## 2023-03-22 LAB — BRAIN NATRIURETIC PEPTIDE: B Natriuretic Peptide: 160.9 pg/mL — ABNORMAL HIGH (ref 0.0–100.0)

## 2023-03-22 MED ORDER — AMLODIPINE BESYLATE 10 MG PO TABS: 10.0000 mg | ORAL_TABLET | Freq: Every day | ORAL | 0 refills | Status: DC

## 2023-03-22 MED ORDER — FUROSEMIDE 20 MG PO TABS
20.0000 mg | ORAL_TABLET | Freq: Once | ORAL | Status: AC
  Administered 2023-03-22: 20 mg via ORAL
  Filled 2023-03-22: qty 1

## 2023-03-22 MED ORDER — AMLODIPINE BESYLATE 5 MG PO TABS
10.0000 mg | ORAL_TABLET | Freq: Once | ORAL | Status: AC
  Administered 2023-03-22: 10 mg via ORAL
  Filled 2023-03-22: qty 2

## 2023-03-22 MED ORDER — FUROSEMIDE 20 MG PO TABS: 20.0000 mg | ORAL_TABLET | Freq: Every day | ORAL | 0 refills | Status: DC

## 2023-03-22 MED ORDER — LOSARTAN POTASSIUM 50 MG PO TABS
50.0000 mg | ORAL_TABLET | ORAL | Status: AC
  Administered 2023-03-22: 50 mg via ORAL
  Filled 2023-03-22: qty 1

## 2023-03-22 MED ORDER — POTASSIUM CHLORIDE CRYS ER 20 MEQ PO TBCR
40.0000 meq | EXTENDED_RELEASE_TABLET | Freq: Once | ORAL | Status: AC
  Administered 2023-03-22: 40 meq via ORAL
  Filled 2023-03-22: qty 2

## 2023-03-22 MED ORDER — GEMFIBROZIL 600 MG PO TABS
600.0000 mg | ORAL_TABLET | Freq: Two times a day (BID) | ORAL | 0 refills | Status: AC
  Filled 2023-03-22: qty 60, 30d supply, fill #0

## 2023-03-22 MED ORDER — ROSUVASTATIN CALCIUM 20 MG PO TABS
20.0000 mg | ORAL_TABLET | Freq: Every day | ORAL | 0 refills | Status: DC
  Filled 2023-03-22: qty 30, 30d supply, fill #0

## 2023-03-22 NOTE — ED Notes (Signed)
Patient complaining of heaviness and numbness in both legs, NIH completed and EDP notified and at bedside.

## 2023-03-22 NOTE — Discharge Instructions (Signed)
You were given a new prescription for your blood pressure medication.  Your cholesterol medicines were [facility] to [facility].  You were given 3 days of Lasix to help get the fluid off of your legs.  When you are sitting try elevating your legs as well which should help.  You start getting shortness of breath, chest pain, passing out or other concerns return to the emergency room.

## 2023-03-22 NOTE — ED Provider Notes (Signed)
[place] EMERGENCY DEPARTMENT AT [facility] Provider Note   CSN: [id number] Arrival date & time: 03/22/23  1641     History  Chief Complaint  Patient presents with   Leg Swelling    Victor [name] is a 52 y.o. male with medical history of dizziness, hypertension, obesity.  Patient presents to the ED for evaluation of leg swelling bilaterally.  Patient reports that beginning yesterday he has developed progressively worsening bilateral lower extremity leg swelling.  Denies history of CHF.  He states that he has been slightly lightheaded, dizzy over the last few days.  Also endorsing transient chest pain that occurs occasionally, last occurred yesterday.  Did not radiate, is not short of breath.  He denies any nausea, vomiting, fevers, syncope.  Patient slightly poor historian, unsure of many questions asked.  HPI     Home Medications Prior to Admission medications   Medication Sig Start Date End Date Taking? Authorizing Provider  amLODipine (NORVASC) 10 MG tablet Take 1 tablet (10 mg total) by mouth daily. 10/15/22   Hoy Register, MD  aspirin 81 MG EC tablet Take 1 tablet (81 mg total) by mouth daily. 07/27/20   Claiborne Rigg, NP  docusate sodium (COLACE) 100 MG capsule Take 1 capsule (100 mg total) by mouth every 12 (twelve) hours. 12/31/19   Mesner, Barbara Cower, MD  gemfibrozil (LOPID) 600 MG tablet Take 1 tablet (600 mg total) by mouth 2 (two) times daily before a meal. 10/15/22   Hoy Register, MD  HYDROcodone-acetaminophen (NORCO/VICODIN) 5-325 MG tablet Take 1 tablet by mouth every 6 (six) hours as needed for moderate pain. 02/04/21   Luretha Murphy, MD  losartan (COZAAR) 50 MG tablet Take 1 tablet (50 mg total) by mouth daily. 10/15/22   Hoy Register, MD  rosuvastatin (CRESTOR) 20 MG tablet Take 1 tablet (20 mg total) by mouth daily. 10/15/22   Hoy Register, MD      Allergies    Patient has no known allergies.    Review of Systems   Review of Systems   Respiratory:  Negative for shortness of breath.   Cardiovascular:  Positive for chest pain and leg swelling.  All other systems reviewed and are negative.   Physical Exam Updated Vital Signs BP (!) 172/94 (BP Location: Right Arm)   Pulse 87   Temp 98.4 F (36.9 C) (Oral)   Resp 18   Ht 5\' 6"  (1.676 m)   Wt 86.2 kg   SpO2 100%   BMI 30.67 kg/m  Physical Exam Vitals and nursing note reviewed.  Constitutional:      General: He is not in acute distress.    Appearance: Normal appearance. He is not ill-appearing, toxic-appearing or diaphoretic.  HENT:     Head: Normocephalic and atraumatic.     Nose: Nose normal.     Mouth/Throat:     Mouth: Mucous membranes are moist.     Pharynx: Oropharynx is clear.  Eyes:     Extraocular Movements: Extraocular movements intact.     Conjunctiva/sclera: Conjunctivae normal.     Pupils: Pupils are equal, round, and reactive to light.  Cardiovascular:     Rate and Rhythm: Normal rate and regular rhythm.  Pulmonary:     Effort: Pulmonary effort is normal.     Breath sounds: Normal breath sounds. No wheezing.  Abdominal:     General: Abdomen is flat. Bowel sounds are normal. There is no distension.     Palpations: Abdomen is soft.  Tenderness: There is no abdominal tenderness.     Comments: Abdomen soft and compressible with no distention  Musculoskeletal:     Cervical back: Normal range of motion and neck supple. No tenderness.     Right lower leg: Edema present.     Left lower leg: Edema present.     Comments: 1+ pitting edema bilaterally  Skin:    General: Skin is warm and dry.     Capillary Refill: Capillary refill takes less than 2 seconds.  Neurological:     Mental Status: He is oriented to person, place, and time.     ED Results / Procedures / Treatments   Labs (all labs ordered are listed, but only abnormal results are displayed) Labs Reviewed  BASIC METABOLIC PANEL - Abnormal; Notable for the following components:       Result Value   Sodium 134 (*)    CO2 21 (*)    Glucose, Bld 118 (*)    Calcium 8.3 (*)    All other components within normal limits  CBC - Abnormal; Notable for the following components:   WBC 3.8 (*)    RBC 3.11 (*)    Hemoglobin 11.5 (*)    HCT 34.4 (*)    MCV 110.6 (*)    MCH 37.0 (*)    RDW 16.1 (*)    Platelets 74 (*)    nRBC 0.8 (*)    All other components within normal limits  BRAIN NATRIURETIC PEPTIDE - Abnormal; Notable for the following components:   B Natriuretic Peptide 160.9 (*)    All other components within normal limits  CBG MONITORING, ED - Abnormal; Notable for the following components:   Glucose-Capillary 111 (*)    All other components within normal limits  TROPONIN I (HIGH SENSITIVITY) - Abnormal; Notable for the following components:   Troponin I (High Sensitivity) 38 (*)    All other components within normal limits  TROPONIN I (HIGH SENSITIVITY) - Abnormal; Notable for the following components:   Troponin I (High Sensitivity) 39 (*)    All other components within normal limits  AMMONIA  HEPATIC FUNCTION PANEL  ETHANOL  RAPID URINE DRUG SCREEN, HOSP PERFORMED    EKG EKG Interpretation Date/Time:  Friday March 22 2023 21:15:36 EDT Ventricular Rate:  81 PR Interval:  168 QRS Duration:  144 QT Interval:  438 QTC Calculation: 509 R Axis:   -71  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 No focal consolidation, pleural effusion, or pneumothorax. No displaced rib fractures. IMPRESSION: No acute cardiopulmonary disease. Electronically Signed   By: Minerva Fester M.D.   On: 03/22/2023 17:35    Procedures Procedures    Medications Ordered in ED Medications  furosemide (LASIX) tablet 20 mg (20 mg Oral Given 03/22/23 2048)  potassium chloride SA (KLOR-CON M) CR tablet 40 mEq (40 mEq Oral Given 03/22/23 2048)  losartan (COZAAR) tablet 50 mg (50 mg Oral Given 03/22/23 2139)  amLODipine (NORVASC) tablet 10 mg (10 mg Oral Given 03/22/23 2139)    ED Course/ Medical Decision Making/ A&P  Medical Decision Making Amount and/or Complexity of Data Reviewed Labs: ordered. Radiology: ordered.   52 year old no presents to ED for evaluation of bilateral lower  extremity swelling.  Please see HPI for further details.  On examination the patient is afebrile, nontachycardic.  Lung sounds are clear bilaterally and he is nonhypoxic.  Abdomen is soft and compressible throughout.  He does have 1+ pitting edema to his bilateral lower extremities.  Neurological examination at baseline, alert and oriented x 4.  Patient CBC shows no leukocytosis, hemoglobin 11.5.  Metabolic panel shows sodium 134 however no elevated creatinine, anion gap 9.  Patient troponin 38, 39 with delta.  Patient BNP elevated to 160.9.  Chest x-ray unremarkable.  Patient provided 20 mg of Lasix, potassium.  During patient workup, nursing staff notifies [place] that the patient has had a change in his mental status.  Patient is lethargic.  Went to patient bedside with attending, patient was given stimulation and did wake up.  The patient is seemingly very slow to respond, slightly confused.  Will add on CT head, hepatic function panel, ethanol, ammonia, rapid urine drug screen.  At end of shift, patient workup not yet complete.  Patient signed out to attending Dr. Anitra Lauth.   Final Clinical Impression(s) / ED Diagnoses Final diagnoses:  Leg edema    Rx / DC Orders ED Discharge Orders     None         Clent Ridges 03/22/23 2159    Gwyneth Sprout, MD 03/23/23 0001

## 2023-03-22 NOTE — ED Triage Notes (Signed)
The pt is c/o bi-lateral leg swelling since yesterday  sometimes he has sob but not since yesterday  he takes bp med but does not take his bp meds every day

## 2023-03-22 NOTE — ED Provider Notes (Signed)
Troponins are flat in the 30s, ammonia, EtOH all negative.  Unclear why patient was acting so sleepy earlier but he is awake and alert at this time.  He was able to get out of bed and use the urinal without any difficulty on his own.  He does report that his legs feel heavy but most likely related to the swelling in them.  He has no focal neurologic deficits.  Patient is hypertensive but he is not been taking his medication regularly.  BNP minimally elevated at 160.  Patient does report drinking a few times a week but denies daily alcohol use.  No history of liver disease.  Ammonia level minimally elevated at 44, and LFTs are slightly elevated with AST of 96 and ALT of 46 which is not significantly different than prior numbers head CT negative for acute pathology.  No findings today to suggest stroke or metabolic encephalopathy such as PRES.   Patient has already urinated approximately 600 mL.  He is out of his blood pressure medication and he was given a new prescription for blood pressure medication as well as 3 days of Lasix to help with the swelling in his legs.  He was given return precautions.   Gwyneth Sprout, MD 03/22/23 [phone]

## 2023-03-22 NOTE — ED Notes (Addendum)
error 

## 2023-03-22 NOTE — ED Notes (Signed)
Patient transported to CT 

## 2023-03-26 ENCOUNTER — Other Ambulatory Visit: Payer: Self-pay

## 2023-04-02 ENCOUNTER — Other Ambulatory Visit: Payer: Self-pay

## 2023-04-23 DIAGNOSIS — Z419 Encounter for procedure for purposes other than remedying health state, unspecified: Secondary | ICD-10-CM | POA: Diagnosis not present

## 2023-04-27 ENCOUNTER — Emergency Department (HOSPITAL_COMMUNITY)
Admission: EM | Admit: 2023-04-27 | Discharge: 2023-04-27 | Disposition: A | Discharge status: Home / Self Care | Payer: Medicaid Other | Attending: Emergency Medicine | Admitting: Emergency Medicine

## 2023-04-27 ENCOUNTER — Emergency Department (HOSPITAL_COMMUNITY): Payer: Medicaid Other

## 2023-04-27 ENCOUNTER — Encounter (HOSPITAL_COMMUNITY): Payer: Self-pay

## 2023-04-27 ENCOUNTER — Other Ambulatory Visit: Payer: Self-pay

## 2023-04-27 DIAGNOSIS — M545 Low back pain, unspecified: Secondary | ICD-10-CM | POA: Diagnosis present

## 2023-04-27 DIAGNOSIS — M47816 Spondylosis without myelopathy or radiculopathy, lumbar region: Secondary | ICD-10-CM | POA: Diagnosis not present

## 2023-04-27 DIAGNOSIS — Z7982 Long term (current) use of aspirin: Secondary | ICD-10-CM | POA: Diagnosis not present

## 2023-04-27 DIAGNOSIS — M5416 Radiculopathy, lumbar region: Secondary | ICD-10-CM | POA: Diagnosis not present

## 2023-04-27 DIAGNOSIS — R600 Localized edema: Secondary | ICD-10-CM | POA: Diagnosis not present

## 2023-04-27 DIAGNOSIS — M5137 Other intervertebral disc degeneration, lumbosacral region with discogenic back pain only: Secondary | ICD-10-CM | POA: Diagnosis not present

## 2023-04-27 DIAGNOSIS — D696 Thrombocytopenia, unspecified: Secondary | ICD-10-CM | POA: Diagnosis not present

## 2023-04-27 LAB — CBC WITH DIFFERENTIAL/PLATELET
Abs Immature Granulocytes: 0.02 10*3/uL (ref 0.00–0.07)
Basophils Absolute: 0 10*3/uL (ref 0.0–0.1)
Basophils Relative: 0 %
Eosinophils Absolute: 0 10*3/uL (ref 0.0–0.5)
Eosinophils Relative: 0 %
HCT: 34.6 % — ABNORMAL LOW (ref 39.0–52.0)
Hemoglobin: 11.1 g/dL — ABNORMAL LOW (ref 13.0–17.0)
Immature Granulocytes: 0 %
Lymphocytes Relative: 20 %
Lymphs Abs: 1.5 10*3/uL (ref 0.7–4.0)
MCH: 34.9 pg — ABNORMAL HIGH (ref 26.0–34.0)
MCHC: 32.1 g/dL (ref 30.0–36.0)
MCV: 108.8 fL — ABNORMAL HIGH (ref 80.0–100.0)
Monocytes Absolute: 0.6 10*3/uL (ref 0.1–1.0)
Monocytes Relative: 8 %
Neutro Abs: 5.3 10*3/uL (ref 1.7–7.7)
Neutrophils Relative %: 72 %
Platelets: 84 10*3/uL — ABNORMAL LOW (ref 150–400)
RBC: 3.18 MIL/uL — ABNORMAL LOW (ref 4.22–5.81)
RDW: 13.4 % (ref 11.5–15.5)
Smear Review: DECREASED
WBC: 7.5 10*3/uL (ref 4.0–10.5)
nRBC: 0 % (ref 0.0–0.2)

## 2023-04-27 LAB — COMPREHENSIVE METABOLIC PANEL
ALT: 24 U/L (ref 0–44)
AST: 30 U/L (ref 15–41)
Albumin: 3.1 g/dL — ABNORMAL LOW (ref 3.5–5.0)
Alkaline Phosphatase: 52 U/L (ref 38–126)
Anion gap: 15 (ref 5–15)
BUN: 6 mg/dL (ref 6–20)
CO2: 20 mmol/L — ABNORMAL LOW (ref 22–32)
Calcium: 8.9 mg/dL (ref 8.9–10.3)
Chloride: 102 mmol/L (ref 98–111)
Creatinine, Ser: 0.71 mg/dL (ref 0.61–1.24)
GFR, Estimated: >60 mL/min (ref >60)
Glucose, Bld: 116 mg/dL — ABNORMAL HIGH (ref 70–99)
Potassium: 3.9 mmol/L (ref 3.5–5.1)
Sodium: 137 mmol/L (ref 135–145)
Total Bilirubin: 0.8 mg/dL (ref 0.3–1.2)
Total Protein: 7.4 g/dL (ref 6.5–8.1)

## 2023-04-27 LAB — URINALYSIS, ROUTINE W REFLEX MICROSCOPIC
Bilirubin Urine: NEGATIVE
Glucose, UA: NEGATIVE mg/dL
Hgb urine dipstick: NEGATIVE
Ketones, ur: NEGATIVE mg/dL
Leukocytes,Ua: NEGATIVE
Nitrite: NEGATIVE
Protein, ur: NEGATIVE mg/dL
Specific Gravity, Urine: 1.017 (ref 1.005–1.030)
pH: 5 (ref 5.0–8.0)

## 2023-04-27 MED ORDER — OXYCODONE HCL 5 MG PO TABS
5.0000 mg | ORAL_TABLET | Freq: Four times a day (QID) | ORAL | 0 refills | Status: DC | PRN
Start: 2023-04-27 — End: 2023-05-07

## 2023-04-27 MED ORDER — METHOCARBAMOL 500 MG PO TABS
1000.0000 mg | ORAL_TABLET | Freq: Three times a day (TID) | ORAL | 0 refills | Status: DC | PRN
Start: 2023-04-27 — End: 2023-05-07

## 2023-04-27 MED ORDER — METHYLPREDNISOLONE SODIUM SUCC 125 MG IJ SOLR
125.0000 mg | Freq: Once | INTRAMUSCULAR | Status: AC
  Administered 2023-04-27: 125 mg via INTRAVENOUS
  Filled 2023-04-27: qty 2

## 2023-04-27 MED ORDER — PREDNISONE 20 MG PO TABS: ORAL_TABLET | ORAL | 0 refills | Status: DC

## 2023-04-27 MED ORDER — OXYCODONE-ACETAMINOPHEN 5-325 MG PO TABS: 1.0000 | ORAL_TABLET | Freq: Once | ORAL | Status: DC

## 2023-04-27 MED ORDER — FENTANYL CITRATE PF 50 MCG/ML IJ SOSY
50.0000 ug | PREFILLED_SYRINGE | Freq: Once | INTRAMUSCULAR | Status: AC
  Administered 2023-04-27: 50 ug via INTRAVENOUS
  Filled 2023-04-27: qty 1

## 2023-04-27 MED ORDER — ONDANSETRON HCL 4 MG/2ML IJ SOLN
4.0000 mg | Freq: Once | INTRAMUSCULAR | Status: AC
  Administered 2023-04-27: 4 mg via INTRAVENOUS
  Filled 2023-04-27: qty 2

## 2023-04-27 MED ORDER — KETOROLAC TROMETHAMINE 30 MG/ML IJ SOLN: 30.0000 mg | Freq: Once | INTRAMUSCULAR | Status: DC

## 2023-04-27 MED ORDER — METHOCARBAMOL 500 MG PO TABS
1000.0000 mg | ORAL_TABLET | Freq: Once | ORAL | Status: AC
  Administered 2023-04-27: 1000 mg via ORAL
  Filled 2023-04-27: qty 2

## 2023-04-27 MED ORDER — METHOCARBAMOL 500 MG PO TABS: 500.0000 mg | ORAL_TABLET | Freq: Once | ORAL | Status: DC

## 2023-04-27 MED ORDER — OXYCODONE HCL 5 MG PO TABS
5.0000 mg | ORAL_TABLET | Freq: Once | ORAL | Status: AC
  Administered 2023-04-27: 5 mg via ORAL
  Filled 2023-04-27: qty 1

## 2023-04-27 NOTE — ED Triage Notes (Signed)
Pt c.o severe lower back pain for the past 2 days, pain radiates down his legs. 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

## 2023-04-27 NOTE — ED Notes (Signed)
Pt aware of need for urine sample. 

## 2023-04-27 NOTE — Discharge Instructions (Signed)
Please read and follow all provided instructions.  Your diagnoses today include:  1. Lumbar radiculopathy   2. Thrombocytopenia (HCC)     Tests performed today include: Vital signs - see below for your results today Complete blood cell count: You continue to have low platelet count Complete metabolic panel: Liver function tests were better today Urinalysis (urine test): No signs of infection or bleeding to suggest kidney stone X-ray of your lower back shows signs of possible degenerative disc disease in the lower part of your spine, this may be contributing to your back pain Pregnancy test (urine or blood, in women only):  Medications prescribed:  Oxycodone - narcotic pain medication  DO NOT drive or perform any activities that require you to be awake and alert because this medicine can make you drowsy.   Robaxin (methocarbamol) - muscle relaxer medication  DO NOT drive or perform any activities that require you to be awake and alert because this medicine can make you drowsy.   Prednisone - steroid medicine   It is best to take this medication in the morning to prevent sleeping problems. If you are diabetic, monitor your blood sugar closely and stop taking Prednisone if blood sugar is over 300. Take with food to prevent stomach upset.   Take any prescribed medications only as directed.  Home care instructions:  Follow any educational materials contained in this packet Please rest, use ice or heat on your back for the next several days Do not lift, push, pull anything more than 10 pounds for the next week  Follow-up instructions: Please follow-up with your primary care provider in the next 1 week for further evaluation of your symptoms.   Return instructions:  SEEK IMMEDIATE MEDICAL ATTENTION IF YOU HAVE: New numbness, tingling, weakness, or problem with the use of your arms or legs Severe back pain not relieved with medications Loss control of your bowels or  bladder Increasing pain in any areas of the body (such as chest or abdominal pain) Shortness of breath, dizziness, or fainting.  Worsening nausea (feeling sick to your stomach), vomiting, fever, or sweats Any other emergent concerns regarding your health   Additional Information:  Your vital signs today were: BP (!) 143/73 (BP Location: Right Arm)   Pulse 100   Temp 99.2 F (37.3 C) (Oral)   Resp 18   Ht 5\' 6"  (1.676 m)   Wt 93 kg   SpO2 98%   BMI 33.09 kg/m  If your blood pressure (BP) was elevated above 135/85 this visit, please have this repeated by your doctor within one month. --------------

## 2023-04-27 NOTE — ED Provider Notes (Signed)
[place] EMERGENCY DEPARTMENT AT [facility] Provider Note   CSN: [id number] Arrival date & time: 04/27/23  1141     History  Chief Complaint  Patient presents with   Back Pain    Victor [name] is a 52 y.o. male.  Patient presents to the emergency department for evaluation of acute onset of lower back pain across his bilateral lower back with radiation into his right leg.  Symptoms started 2 days ago.  No acute injuries or falls.  He has never had pain like this before.  It is constant.  It is causing difficulty sleeping.  He is able to ambulate. Patient denies warning symptoms of back pain including: fecal incontinence, urinary retention or overflow incontinence, night sweats, waking from sleep with back pain, unexplained fevers or weight loss, h/o cancer, IVDU, recent trauma.  States that he has tried ibuprofen without improvement.  No history of kidney stones.  No bleeding in the stool.  No abdominal pain.  Patient seen in the end of August for bilateral lower extremity swelling.  Liver function tests were elevated at that time.  He was thrombocytopenic.  Patient states that he does not drink alcohol every day but several days a week.  Last reported alcohol use was yesterday.  He states that he did not follow-up after his last ED visit.       Home Medications Prior to Admission medications   Medication Sig Start Date End Date Taking? Authorizing Provider  amLODipine (NORVASC) 10 MG tablet Take 1 tablet (10 mg total) by mouth daily. 03/22/23   Gwyneth Sprout, MD  aspirin 81 MG EC tablet Take 1 tablet (81 mg total) by mouth daily. 07/27/20   Claiborne Rigg, NP  docusate sodium (COLACE) 100 MG capsule Take 1 capsule (100 mg total) by mouth every 12 (twelve) hours. 12/31/19   Mesner, Barbara Cower, MD  furosemide (LASIX) 20 MG tablet Take 1 tablet (20 mg total) by mouth daily. 03/22/23   Gwyneth Sprout, MD  gemfibrozil (LOPID) 600 MG tablet Take 1 tablet (600 mg total) by mouth 2  (two) times daily before a meal. 03/22/23   Gwyneth Sprout, MD  HYDROcodone-acetaminophen (NORCO/VICODIN) 5-325 MG tablet Take 1 tablet by mouth every 6 (six) hours as needed for moderate pain. 02/04/21   Luretha Murphy, MD  losartan (COZAAR) 50 MG tablet Take 1 tablet (50 mg total) by mouth daily. 10/15/22   Hoy Register, MD  rosuvastatin (CRESTOR) 20 MG tablet Take 1 tablet (20 mg total) by mouth daily. 03/22/23   Gwyneth Sprout, MD      Allergies    Patient has no known allergies.    Review of Systems   Review of Systems  Physical Exam Updated Vital Signs BP (!) 161/87 (BP Location: Right Arm)   Pulse 98   Temp 99 F (37.2 C) (Oral)   Resp 16   Ht 5\' 6"  (1.676 m)   Wt 93 kg   SpO2 98%   BMI 33.09 kg/m  Physical Exam Vitals and nursing note reviewed.  Constitutional:      Appearance: He is well-developed.  HENT:     Head: Normocephalic and atraumatic.  Eyes:     Conjunctiva/sclera: Conjunctivae normal.  Abdominal:     Palpations: Abdomen is soft.     Tenderness: There is no abdominal tenderness. There is no right CVA tenderness or left CVA tenderness.  Musculoskeletal:        General: Normal range of motion.  Cervical back: Normal range of motion. No tenderness or bony tenderness.     Thoracic back: No tenderness or bony tenderness.     Lumbar back: Tenderness present. No bony tenderness. Normal range of motion.     Right lower leg: Edema present.     Left lower leg: Edema present.     Comments: No step-off noted with palpation of spine.  1+ pitting edema.  Skin:    General: Skin is warm and dry.  Neurological:     Mental Status: He is alert.     Sensory: No sensory deficit.     Motor: No abnormal muscle tone.     Comments: 5/5 strength in entire lower extremities bilaterally. No sensation deficit.   Psychiatric:        Mood and Affect: Mood normal.     ED Results / Procedures / Treatments   Labs (all labs ordered are listed, but only abnormal  results are displayed) Labs Reviewed  CBC WITH DIFFERENTIAL/PLATELET - Abnormal; Notable for the following components:      Result Value   RBC 3.18 (*)    Hemoglobin 11.1 (*)    HCT 34.6 (*)    MCV 108.8 (*)    MCH 34.9 (*)    Platelets 84 (*)    All other components within normal limits  COMPREHENSIVE METABOLIC PANEL - Abnormal; Notable for the following components:   CO2 20 (*)    Glucose, Bld 116 (*)    Albumin 3.1 (*)    All other components within normal limits  URINALYSIS, ROUTINE W REFLEX MICROSCOPIC    EKG None  Radiology DG Lumbar Spine Complete  Result Date: 04/27/2023 CLINICAL DATA:  Low back pain radiating to the right leg. EXAM: LUMBAR SPINE - COMPLETE 4+ VIEW COMPARISON:  None Available. FINDINGS: There are 5 non rib-bearing lumbar type vertebral bodies. Mild straightening expected lumbar lordosis. No anterolisthesis or retrolisthesis. No significant scoliotic curvature. No definite pars defects. Lumbar vertebral body heights appear preserved. Moderate DDD of L5-S1 with disc space height loss, endplate irregularity and sclerosis. Mild-to-moderate degenerative change of T12-L1 with anteriorly directed disc osteophyte complex at this location. Limited visualization of the bilateral SI joints and hips is normal. Regional soft tissues appear normal. 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 Electronically Signed   By: Simonne Come M.D.   On: 04/27/2023 16:16    Procedures Procedures    Medications Ordered in ED Medications  fentaNYL (SUBLIMAZE) injection 50 mcg (50 mcg Intravenous Given 04/27/23 1329)  ondansetron (ZOFRAN) injection 4 mg (4 mg Intravenous Given 04/27/23 1327)  methylPREDNISolone sodium succinate (SOLU-MEDROL) 125 mg/2 mL injection 125 mg (125 mg Intravenous Given 04/27/23 1521)  methocarbamol (ROBAXIN) tablet 1,000 mg (1,000 mg Oral Given 04/27/23 1521)  oxyCODONE (Oxy IR/ROXICODONE) immediate release tablet 5 mg (5 mg Oral  Given 04/27/23 1521)    ED Course/ Medical Decision Making/ A&P    Patient seen and examined. History obtained directly from patient.  Reviewed previous ED visit and lab workup.  Labs/EKG: Ordered CBC, CMP, UA.  Imaging: Will decide on imaging based on lab work  Medications/Fluids: Ordered: IV fentanyl, IV Zofran.   Most recent vital signs reviewed and are as follows: BP (!) 161/87 (BP Location: Right Arm)   Pulse 98   Temp 99 F (37.2 C) (Oral)   Resp 16   Ht 5\' 6"  (1.676 m)   Wt 93 kg   SpO2 98%   BMI 33.09 kg/m  Initial impression: Patient with acute onset of back pain.  He does not have a history of the same.  Patient was seen about a month ago and was found to be thrombocytopenic.  He denies any obvious falls or injuries.  He does not have routine follow-up.  Given this, would like to recheck lab work and consider imaging of the back.  2:42 PM Reassessment performed. Patient appears stable.  Still has pain.  Complains of waves of pain every 5 minutes.  Requesting additional medication.  Labs personally reviewed and interpreted including: CBC with normal white blood cell count, hemoglobin slightly low 11.1, macrocytic with MCV of 108, platelets stable at 84; CMP unremarkable; UA without infection or blood.  Imaging: Ordered plain lumbar spine films  Reviewed pertinent lab work and imaging with patient at bedside. Questions answered.   Most current vital signs reviewed and are as follows: BP (!) 143/73 (BP Location: Right Arm)   Pulse 100   Temp 99.2 F (37.3 C) (Oral)   Resp 18   Ht 5\' 6"  (1.676 m)   Wt 93 kg   SpO2 98%   BMI 33.09 kg/m   Plan: Will give a dose of IV Solu-Medrol, oral oxycodone, oral Robaxin.  4:34 PM on reassessment, patient improved, resting comfortably.  Initial impression: low back pain with radicular features  Home treatment plan: Patient was counseled on back pain precautions and advised to do activity as tolerated but avoid strenuous  activity and do not lift, push, or pull heavy objects more than 10 pounds for the next week.  Patient counseled to use ice or heat on back as needed for pain and spasm.    Medications prescribed: Will give # 5 tablets oxycodone to use over the next 1 to 2 days while the prednisone begins to work.  Also Robaxin for muscle pain and spasm. Patient counseled on proper use of muscle relaxant medication.  They were requested not to drink alcohol, drive any vehicle, or do any dangerous activities while taking this medication due to potential drowsiness and unintended.  Patient verbalized understanding.  Return instructions discussed with patient: Urged to return with worsening severe pain, loss of bowel or bladder control, trouble walking, development of weakness in the legs, or with any other concerns.  Follow-up instructions discussed with patient: Patient urged to follow-up with PCP if pain does not improve with treatment and rest or if pain becomes recurrent.                                 Medical Decision Making Amount and/or Complexity of Data Reviewed Labs: ordered. Radiology: ordered.  Risk Prescription drug management.   Patient with back pain with radicular symptoms. No neurological deficits. Patient is ambulatory.  Initial concern for recent thrombocytopenia and possibly for renal colic, however UA is clear and no severe anemia and platelet count appears to be stable.  Plain films are suggestive of degenerative disc disease at L5-S1 which could be contributing to the patient's symptoms today.  Do not feel that he requires advanced imaging at this time.  No warning symptoms of back pain including: fecal incontinence, urinary retention or overflow incontinence, night sweats, waking from sleep with back pain, unexplained fevers or weight loss, h/o cancer, IVDU, recent trauma. No concern for cauda equina, epidural abscess, or other serious cause of back pain. Conservative measures such as rest,  ice/heat and pain medicine indicated with PCP follow-up  if no improvement with conservative management.          Final Clinical Impression(s) / ED Diagnoses Final diagnoses:  Lumbar radiculopathy  Thrombocytopenia (HCC)    Rx / DC Orders ED Discharge Orders          Ordered    oxyCODONE (OXY IR/ROXICODONE) 5 MG immediate release tablet  Every 6 hours PRN        04/27/23 1632    methocarbamol (ROBAXIN) 500 MG tablet  Every 8 hours PRN        04/27/23 1632    predniSONE (DELTASONE) 20 MG tablet        04/27/23 1632              Renne Crigler, PA-C 04/27/23 1636    Ernie Avena, MD 04/27/23 1815

## 2023-05-01 ENCOUNTER — Ambulatory Visit: Payer: Self-pay | Admitting: *Deleted

## 2023-05-01 NOTE — Telephone Encounter (Signed)
  Chief Complaint: Severe lower back pain   Seen in ED 10/5 told he has a pinched nerve in his spine Symptoms: Radiates down both legs Frequency: Constant pain.   Percocet given in the ED not helping. Pertinent Negatives: Patient denies injuries or accidents.   Sudden onset Disposition: [] ED /[] Urgent Care (no appt availability in office) / [x] Appointment(In office/virtual)/ []  [facility]/ [] Home Care/ [] Refused Recommended Disposition /[] [place] [facility]/ []  Follow-up with PCP Additional Notes: Appt made with Dr. Shan Levans for 05/07/2023 at 3:50.   Instructed to go to ED or urgent care if pain becomes worse.

## 2023-05-01 NOTE — Telephone Encounter (Signed)
Reason for Disposition  [1] SEVERE back pain (e.g., excruciating, unable to do any normal activities) AND [2] not improved 2 hours after pain medicine  Answer Assessment - Initial Assessment Questions 1. ONSET: "When did the pain begin?"      I'm having real bad back pain.    Began Oct. 2, 2024.    I was in the hospital for the back pain.   I have a pinched nerve in my spine.   2. LOCATION: "Where does it hurt?" (upper, mid or lower back)     It's going down both my legs.   Lower back pain.    3. SEVERITY: "How bad is the pain?"  (e.g., Scale 1-10; mild, moderate, or severe)   - MILD (1-3): Doesn't interfere with normal activities.    - MODERATE (4-7): Interferes with normal activities or awakens from sleep.    - SEVERE (8-10): Excruciating pain, unable to do any normal activities.      [facility] to follow up with my PCP 4. PATTERN: "Is the pain constant?" (e.g., yes, no; constant, intermittent)      constant 5. RADIATION: "Does the pain shoot into your legs or somewhere else?"     Down both legs 6. CAUSE:  "What do you think is causing the back pain?"      Pinched nerve in my spine. 7. BACK OVERUSE:  "Any recent lifting of heavy objects, strenuous work or exercise?"     Not asked 8. MEDICINES: "What have you taken so far for the pain?" (e.g., nothing, acetaminophen, NSAIDS)     They gave me Percocet 5 mg.   They are not helping.   I'm on steroids and a muscle relaxer.   9. NEUROLOGIC SYMPTOMS: "Do you have any weakness, numbness, or problems with bowel/bladder control?"     I walk very slowly.    10. OTHER SYMPTOMS: "Do you have any other symptoms?" (e.g., fever, abdomen pain, burning with urination, blood in urine)       Not asked since in in hospital recently 11. PREGNANCY: "Is there any chance you are pregnant?" "When was your last menstrual period?"       N/A  Protocols used: Back Pain-A-AH

## 2023-05-06 NOTE — Progress Notes (Unsigned)
   Acute Office Visit  Subjective:     Patient ID: [name], male    DOB: [birth year], 52 y.o.   MRN: [id number]  No chief complaint on file.   HPI Patient is in today for ***  ROS      Objective:    There were no vitals taken for this visit. {Vitals History (Optional):23777}  Physical Exam  No results found for any visits on 05/07/23.      Assessment & Plan:   Problem List Items Addressed This Visit   None   No orders of the defined types were placed in this encounter.   No follow-ups on file.  Shan Levans, MD

## 2023-05-07 ENCOUNTER — Encounter: Payer: Self-pay | Admitting: Critical Care Medicine

## 2023-05-07 ENCOUNTER — Ambulatory Visit: Payer: Medicaid Other | Attending: Critical Care Medicine | Admitting: Critical Care Medicine

## 2023-05-07 ENCOUNTER — Other Ambulatory Visit: Payer: Self-pay

## 2023-05-07 VITALS — BP 141/81 | HR 95 | Wt 199.6 lb

## 2023-05-07 DIAGNOSIS — M5416 Radiculopathy, lumbar region: Secondary | ICD-10-CM | POA: Diagnosis not present

## 2023-05-07 MED ORDER — TIZANIDINE HCL 4 MG PO TABS
4.0000 mg | ORAL_TABLET | Freq: Four times a day (QID) | ORAL | 0 refills | Status: DC | PRN
  Filled 2023-05-07: qty 30, 8d supply, fill #0

## 2023-05-07 MED ORDER — DICLOFENAC SODIUM 75 MG PO TBEC
75.0000 mg | DELAYED_RELEASE_TABLET | Freq: Two times a day (BID) | ORAL | 0 refills | Status: DC
  Filled 2023-05-07: qty 60, 30d supply, fill #0

## 2023-05-07 MED ORDER — PREDNISONE 10 MG PO TABS
ORAL_TABLET | ORAL | 0 refills | Status: DC
  Filled 2023-05-07: qty 20, 5d supply, fill #0

## 2023-05-07 MED ORDER — GABAPENTIN 300 MG PO CAPS
300.0000 mg | ORAL_CAPSULE | Freq: Three times a day (TID) | ORAL | 3 refills | Status: DC
  Filled 2023-05-07: qty 90, 30d supply, fill #0

## 2023-05-07 NOTE — Assessment & Plan Note (Signed)
Acute onset lumbar radicular pain  Referral to orthopedics made  Begin diclofenac 75 mg twice daily Repulsed prednisone Trial of tizanidine muscle relaxant Trial of gabapentin

## 2023-05-07 NOTE — Patient Instructions (Signed)
Start zanaflex/tizandine as needed for muscle spasm Start Gabapentin three times daily for back pain Start diclofenac one twice daily for back pain Take prednisone 4 daily until gone Referral to orthopedic spine made Return Victor [name] in 3 months

## 2023-05-15 ENCOUNTER — Other Ambulatory Visit: Payer: Self-pay

## 2023-05-15 ENCOUNTER — Ambulatory Visit: Payer: Medicaid Other | Admitting: Physical Medicine and Rehabilitation

## 2023-05-15 ENCOUNTER — Encounter: Payer: Self-pay | Admitting: Physical Medicine and Rehabilitation

## 2023-05-15 DIAGNOSIS — G8929 Other chronic pain: Secondary | ICD-10-CM

## 2023-05-15 DIAGNOSIS — M5441 Lumbago with sciatica, right side: Secondary | ICD-10-CM | POA: Diagnosis not present

## 2023-05-15 DIAGNOSIS — M5416 Radiculopathy, lumbar region: Secondary | ICD-10-CM

## 2023-05-15 DIAGNOSIS — M5442 Lumbago with sciatica, left side: Secondary | ICD-10-CM

## 2023-05-15 DIAGNOSIS — M47816 Spondylosis without myelopathy or radiculopathy, lumbar region: Secondary | ICD-10-CM | POA: Diagnosis not present

## 2023-05-15 MED ORDER — PREDNISONE 50 MG PO TABS
50.0000 mg | ORAL_TABLET | Freq: Every day | ORAL | 0 refills | Status: DC
  Filled 2023-05-15: qty 5, 5d supply, fill #0

## 2023-05-15 NOTE — Progress Notes (Signed)
Functional Pain Scale - descriptive words and definitions  Unmanageable (7)  Pain interferes with normal ADL's/nothing seems to help/sleep is very difficult/active distractions are very difficult to concentrate on. Severe range order  Average Pain 9  Lower back pain on both sides that radiates down the side of the upper leg. The only thing that has helped it is prednisone

## 2023-05-15 NOTE — Progress Notes (Signed)
Victor [name] - 52 y.o. male MRN [id number]  Date of birth: [birth year]  Office Visit Note: Visit Date: 05/15/2023 PCP: Claiborne Rigg, NP Referred by: Storm Frisk, MD  Subjective: Chief Complaint  Patient presents with  . Lower Back - Pain   HPI: Victor [name] is a 52 y.o. male who comes in today per the request of Dr. Shan Levans for evaluation of acute bilateral lower back pain radiating down lateral thighs to knees, left greater than right. His pain woke him up from sleep on 04/25/2023. He was evaluated in the emergency department on 04/27/2023, recent lumbar radiographs exhibit mild to moderate degenerative disc disease of L5-S1. He was discharged home with Oxycodone, Prednisone and Robaxin. He was also prescribed Voltaren by his PCP with minimal relief of pain. His pain worsens with standing, walking and activity. He describes as burning sensation, currently rates as 8 out of 10. Some relief of pain with rest and use of medications. Most significant relief of pain with recent regimen of oral Prednisone. No history of formal physical therapy. No history of lumbar surgery/injections. Patient currently ambulating with cane, gait slow and unsteady. Patient denies focal weakness, numbness and tingling. No recent trauma or falls.   Review of Systems  Cardiovascular:  Positive for leg swelling.  Musculoskeletal:  Positive for back pain and myalgias.  Neurological:  Negative for tingling, sensory change, focal weakness and weakness.  All other systems reviewed and are negative.  Otherwise per HPI.  Assessment & Plan: Visit Diagnoses:    ICD-10-CM   1. Lumbar radiculopathy  M54.16 MR LUMBAR SPINE WO CONTRAST    2. Chronic bilateral low back pain with bilateral sciatica  M54.42    M54.41    G89.29     3. Facet arthropathy, lumbar  M47.816        Plan: Findings:  Acute bilateral lower back pain radiating down lateral thighs to knees, left greater than right. Patient continues to  have severe pain despite good conservative therapies such as rest and use of medications. Patients clinical presentation and exam are consistent with L5 nerve pattern. Upon independent review of recent lumbar radiographs there is mild leftward curvature, multi level facet arthropathy, most severe to lower lumbar spine, disc height loss at L5-S1 and biforaminal narrowing at L4-L5 and L5-S1. We discussed treatment plan in detail today, next step is to obtain lumbar MRI imaging. Depending on results of MRI imaging we discussed possibility of performing lumbar epidural steroid injections. I also discussed medication management and prescribed short course of oral Prednisone. He can continue with Voltaren as needed. We will see him back for lumbar MRI review and to discuss options. No red flag symptoms noted upon exam today.     Meds & Orders:  Meds ordered this encounter  Medications  . predniSONE (DELTASONE) 50 MG tablet    Sig: Take 1 tablet (50 mg total) by mouth daily with breakfast. Take until completed.    Dispense:  5 tablet    Refill:  0    Orders Placed This Encounter  Procedures  . MR LUMBAR SPINE WO CONTRAST    Follow-up: Return for lumbar MRI review.   Procedures: No procedures performed      Clinical History: No specialty comments available.   He reports that he has never smoked. His smokeless tobacco use includes chew. No results for input(s): "HGBA1C", "LABURIC" in the last 8760 hours.  Objective:  VS:  HT:    WT:   BMI:  BP:   HR: bpm  TEMP: ( )  RESP:  Physical Exam Vitals and nursing note reviewed.  HENT:     Head: Normocephalic and atraumatic.     Right Ear: External ear normal.     Left Ear: External ear normal.     Nose: Nose normal.     Mouth/Throat:     Mouth: Mucous membranes are moist.  Eyes:     Extraocular Movements: Extraocular movements intact.  Cardiovascular:     Rate and Rhythm: Normal rate.     Pulses: Normal pulses.  Pulmonary:      Effort: Pulmonary effort is normal.  Abdominal:     General: Abdomen is flat. There is no distension.  Musculoskeletal:        General: Tenderness present.     Comments: Patient is slow to rise from seated position to standing. Good lumbar range of motion. No pain noted with facet loading. 5/5 strength noted with bilateral hip flexion, knee flexion/extension, ankle dorsiflexion/plantarflexion and EHL. No clonus noted bilaterally. No pain upon palpation of greater trochanters. No pain with internal/external rotation of bilateral hips. Sensation intact bilaterally. Dysesthesias noted to bilateral L5 dermatomes. Negative slump test bilaterally. Ambulates with cane, gait slow and unsteady.     Skin:    General: Skin is warm and dry.     Capillary Refill: Capillary refill takes less than 2 seconds.  Neurological:     General: No focal deficit present.     Mental Status: He is alert and oriented to person, place, and time.  Psychiatric:        Mood and Affect: Mood normal.        Behavior: Behavior normal.    Ortho Exam  Imaging: No results found.  Past Medical/Family/Surgical/Social History: Medications & Allergies reviewed per EMR, new medications updated. Patient Active Problem List   Diagnosis Date Noted  . Lumbar back pain with radiculopathy affecting lower extremity 05/07/2023  . S/P hernia repair 02/03/2021  . Inguinal hernia 02/03/2021  . HTN (hypertension) 12/06/2014  . Dyspnea 10/16/2013  . Hypertriglyceridemia   . Dizziness 10/15/2013  . Weakness 10/15/2013  . Abnormal EKG 10/15/2013   Past Medical History:  Diagnosis Date  . Dizziness 10/15/2013    "SWIMMY HEAD    "  . Hypertension   . Hypertriglyceridemia   . Obesity    Family History  Problem Relation Age of Onset  . CAD Mother        stenting @ 101; alive @ 94  . Aneurysm Mother        thoracic s/p surgery @ 23  . Lung disease Mother   . Other Father        unknown   Past Surgical History:  Procedure  Laterality Date  . INGUINAL HERNIA REPAIR Right 02/03/2021   Procedure: HERNIA REPAIR INGUINAL ADULT;  Surgeon: Diamantina Monks, MD;  Location: [facility] OR;  Service: General;  Laterality: Right;  . INSERTION OF MESH Right 02/03/2021   Procedure: INSERTION OF MESH;  Surgeon: Diamantina Monks, MD;  Location: [facility] OR;  Service: General;  Laterality: Right;  . NO PAST SURGERIES     Social History   Occupational History  . Not on file  Tobacco Use  . Smoking status: Never  . Smokeless tobacco: Current    Types: Chew  Vaping Use  . Vaping status: Never Used  Substance and Sexual Activity  . Alcohol use: Yes    Comment: 4 12 oz beers twice/wk.  Marland Kitchen  Drug use: No  . Sexual activity: Yes

## 2023-05-22 ENCOUNTER — Telehealth: Payer: Self-pay | Admitting: Physical Medicine and Rehabilitation

## 2023-05-22 ENCOUNTER — Other Ambulatory Visit: Payer: Self-pay

## 2023-05-22 ENCOUNTER — Other Ambulatory Visit: Payer: Self-pay | Admitting: Physical Medicine and Rehabilitation

## 2023-05-22 NOTE — Telephone Encounter (Signed)
Patient called needing Rx refilled Prednisone. The number to contact patient is [phone]

## 2023-05-23 ENCOUNTER — Telehealth: Payer: Self-pay | Admitting: Physical Medicine and Rehabilitation

## 2023-05-23 ENCOUNTER — Other Ambulatory Visit: Payer: Self-pay | Admitting: Physical Medicine and Rehabilitation

## 2023-05-23 ENCOUNTER — Other Ambulatory Visit: Payer: Self-pay

## 2023-05-23 MED ORDER — MELOXICAM 15 MG PO TABS
15.0000 mg | ORAL_TABLET | Freq: Every day | ORAL | 0 refills | Status: DC
  Filled 2023-05-23: qty 30, 30d supply, fill #0

## 2023-05-23 MED ORDER — METHOCARBAMOL 500 MG PO TABS
500.0000 mg | ORAL_TABLET | Freq: Three times a day (TID) | ORAL | 0 refills | Status: DC
  Filled 2023-05-23: qty 90, 30d supply, fill #0

## 2023-05-23 NOTE — Telephone Encounter (Signed)
Patient called and ask if there is something you can give him for pain. [phone]

## 2023-05-24 DIAGNOSIS — Z419 Encounter for procedure for purposes other than remedying health state, unspecified: Secondary | ICD-10-CM | POA: Diagnosis not present

## 2023-05-27 ENCOUNTER — Other Ambulatory Visit: Payer: Self-pay

## 2023-06-04 ENCOUNTER — Other Ambulatory Visit: Payer: Medicaid Other

## 2023-06-04 ENCOUNTER — Other Ambulatory Visit: Payer: Self-pay

## 2023-06-05 ENCOUNTER — Ambulatory Visit: Payer: Self-pay

## 2023-06-05 ENCOUNTER — Other Ambulatory Visit: Payer: Self-pay

## 2023-06-05 NOTE — Telephone Encounter (Signed)
Chief Complaint: Leg swelling to both lower legs from knees down to ankles Symptoms: pain 6/10 Frequency: onset 4 days ago Pertinent Negatives: Patient denies other symptoms Disposition: [] ED /[] Urgent Care (no appt availability in office) / [] Appointment(In office/virtual)/ []  [facility]/ [] Home Care/ [] Refused Recommended Disposition /[x] [place] [facility]/ []  Follow-up with PCP Additional Notes: Patient asked about getting a refill of lasix (sent request in TE). Advised he would need OV for evaluation of swelling. Advised no available appointments in office until Dec 5th. [employer] today, but he says he wouldn't be able to go because his son gets off work at 1700 and the end time is at [employer]. He asked when would it be in [place] again, advised on Tuesday. He says he would go then. Advised I will send this to the provider for a sooner appointment.     Summary: Lower extremity swelling   From the knees down legs, ankle and feet are swollen for the last 4 days. Pain about a six would like to request a med that was given to him before that worked: furosemide (LASIX) tablet 20 mg [1610     Reason for Disposition  [1] MODERATE leg swelling (e.g., swelling extends up to knees) AND [2] new-onset or worsening  Answer Assessment - Initial Assessment Questions 1. ONSET: "When did the swelling start?" (e.g., minutes, hours, days)     4 days ago 2. LOCATION: "What part of the leg is swollen?"  "Are both legs swollen or just one leg?"     Both legs and ankles 3. SEVERITY: "How bad is the swelling?" (e.g., localized; mild, moderate, severe)   - Localized: Small area of swelling localized to one leg.   - MILD pedal edema: Swelling limited to foot and ankle, pitting edema < 1/4 inch (6 mm) deep, rest and elevation eliminate most or all swelling.   - MODERATE edema: Swelling of lower leg to knee, pitting edema > 1/4 inch (6 mm) deep, rest and elevation only partially reduce  swelling.   - SEVERE edema: Swelling extends above knee, facial or hand swelling present.      Moderate 4. REDNESS: "Does the swelling look red or infected?"     No 5. PAIN: "Is the swelling painful to touch?" If Yes, ask: "How painful is it?"   (Scale 1-10; mild, moderate or severe)     6 6. CAUSE: "What do you think is causing the leg swelling?"     Unknown 7. MEDICAL HISTORY: "Do you have a history of blood clots (e.g., DVT), cancer, heart failure, kidney disease, or liver failure?"     No 8. RECURRENT SYMPTOM: "Have you had leg swelling before?" If Yes, ask: "When was the last time?" "What happened that time?"     Yes a month or longer ago, went to ED and was given lasix pills to take 10. OTHER SYMPTOMS: "Do you have any other symptoms?" (e.g., chest pain, difficulty breathing)       Back pain  Protocols used: Leg Swelling and Edema-A-AH

## 2023-06-05 NOTE — Telephone Encounter (Signed)
No earlier  appointments available . [facility] is our best option.

## 2023-06-05 NOTE — Telephone Encounter (Signed)
Patient is requesting refill of lasix. He was triaged for the leg swelling, see nurse triage encounter.  [facility] Phone: [phone]  Fax: [phone]

## 2023-06-06 NOTE — Telephone Encounter (Signed)
Requested medication (s) are due for refill today:yes  Requested medication (s) are on the active medication list:yes  Last refill:  03/22/23 # 3 tabs  Future visit scheduled: no  Notes to clinic:  last prescribed at ED discharge by Dr. Letta Kocher. See  Nurse triage notes.   Requested Prescriptions  Pending Prescriptions Disp Refills   furosemide (LASIX) 20 MG tablet 3 tablet 0    Sig: Take 1 tablet (20 mg total) by mouth daily.     Cardiovascular:  Diuretics - Loop Failed - 06/05/2023  4:20 PM      Failed - Mg Level in normal range and within 180 days    No results found for: "MG"       Failed - Last BP in normal range    BP Readings from Last 1 Encounters:  05/07/23 (!) 141/81         Passed - K in normal range and within 180 days    Potassium  Date Value Ref Range Status  04/27/2023 3.9 3.5 - 5.1 mmol/L Final         Passed - Ca in normal range and within 180 days    Calcium  Date Value Ref Range Status  04/27/2023 8.9 8.9 - 10.3 mg/dL Final         Passed - Na in normal range and within 180 days    Sodium  Date Value Ref Range Status  04/27/2023 137 135 - 145 mmol/L Final  07/27/2020 140 134 - 144 mmol/L Final         Passed - Cr in normal range and within 180 days    Creat  Date Value Ref Range Status  07/30/2016 0.95 0.60 - 1.35 mg/dL Final   Creatinine, Ser  Date Value Ref Range Status  04/27/2023 0.71 0.61 - 1.24 mg/dL Final   Creatinine, Urine  Date Value Ref Range Status  07/24/2016 256 20 - 370 mg/dL Final         Passed - Cl in normal range and within 180 days    Chloride  Date Value Ref Range Status  04/27/2023 102 98 - 111 mmol/L Final         Passed - Valid encounter within last 6 months    Recent Outpatient Visits           1 month ago Lumbar back pain with radiculopathy affecting lower extremity   [facility] [place] Storm Frisk, MD   1 year ago Primary  hypertension   [facility] [place]. [facility] Claiborne Rigg, NP   2 years ago Primary hypertension   [facility] [place]. [facility] Claiborne Rigg, NP   2 years ago Essential hypertension   [facility] [place]. [facility] Claiborne Rigg, NP   4 years ago Essential hypertension   [facility] [place]. [facility] Claiborne Rigg, [place]

## 2023-06-08 MED ORDER — FUROSEMIDE 20 MG PO TABS
20.0000 mg | ORAL_TABLET | Freq: Every day | ORAL | 0 refills | Status: DC
  Filled 2023-06-08: qty 3, 3d supply, fill #0

## 2023-06-10 ENCOUNTER — Other Ambulatory Visit: Payer: Self-pay

## 2023-06-13 ENCOUNTER — Other Ambulatory Visit: Payer: Self-pay

## 2023-06-17 ENCOUNTER — Other Ambulatory Visit: Payer: Self-pay

## 2023-06-17 ENCOUNTER — Encounter (HOSPITAL_COMMUNITY): Payer: Self-pay

## 2023-06-17 ENCOUNTER — Emergency Department (HOSPITAL_COMMUNITY): Payer: Medicaid Other

## 2023-06-17 ENCOUNTER — Inpatient Hospital Stay (HOSPITAL_COMMUNITY)
Admission: EM | Admit: 2023-06-17 | Discharge: 2023-07-14 | DRG: 216 | Disposition: A | Discharge status: Home Health | Payer: Medicaid Other | Attending: Internal Medicine | Admitting: Internal Medicine

## 2023-06-17 ENCOUNTER — Ambulatory Visit
Admission: RE | Admit: 2023-06-17 | Discharge: 2023-06-17 | Disposition: A | Discharge status: Home / Self Care | Payer: Medicaid Other | Source: Ambulatory Visit | Attending: Physical Medicine and Rehabilitation | Admitting: Physical Medicine and Rehabilitation

## 2023-06-17 DIAGNOSIS — R0602 Shortness of breath: Secondary | ICD-10-CM | POA: Diagnosis not present

## 2023-06-17 DIAGNOSIS — R079 Chest pain, unspecified: Secondary | ICD-10-CM | POA: Diagnosis not present

## 2023-06-17 DIAGNOSIS — E871 Hypo-osmolality and hyponatremia: Secondary | ICD-10-CM | POA: Diagnosis present

## 2023-06-17 DIAGNOSIS — R748 Abnormal levels of other serum enzymes: Secondary | ICD-10-CM

## 2023-06-17 DIAGNOSIS — I509 Heart failure, unspecified: Principal | ICD-10-CM

## 2023-06-17 DIAGNOSIS — I110 Hypertensive heart disease with heart failure: Secondary | ICD-10-CM | POA: Diagnosis present

## 2023-06-17 DIAGNOSIS — L2989 Other pruritus: Secondary | ICD-10-CM | POA: Diagnosis not present

## 2023-06-17 DIAGNOSIS — Z91041 Radiographic dye allergy status: Secondary | ICD-10-CM

## 2023-06-17 DIAGNOSIS — K648 Other hemorrhoids: Secondary | ICD-10-CM | POA: Diagnosis present

## 2023-06-17 DIAGNOSIS — Y838 Other surgical procedures as the cause of abnormal reaction of the patient, or of later complication, without mention of misadventure at the time of the procedure: Secondary | ICD-10-CM | POA: Diagnosis not present

## 2023-06-17 DIAGNOSIS — Z720 Tobacco use: Secondary | ICD-10-CM

## 2023-06-17 DIAGNOSIS — I421 Obstructive hypertrophic cardiomyopathy: Secondary | ICD-10-CM | POA: Diagnosis present

## 2023-06-17 DIAGNOSIS — F1090 Alcohol use, unspecified, uncomplicated: Secondary | ICD-10-CM | POA: Diagnosis present

## 2023-06-17 DIAGNOSIS — D509 Iron deficiency anemia, unspecified: Secondary | ICD-10-CM | POA: Diagnosis present

## 2023-06-17 DIAGNOSIS — G4700 Insomnia, unspecified: Secondary | ICD-10-CM | POA: Diagnosis not present

## 2023-06-17 DIAGNOSIS — D696 Thrombocytopenia, unspecified: Secondary | ICD-10-CM | POA: Diagnosis present

## 2023-06-17 DIAGNOSIS — M5117 Intervertebral disc disorders with radiculopathy, lumbosacral region: Secondary | ICD-10-CM | POA: Diagnosis present

## 2023-06-17 DIAGNOSIS — I5033 Acute on chronic diastolic (congestive) heart failure: Secondary | ICD-10-CM | POA: Diagnosis present

## 2023-06-17 DIAGNOSIS — I10 Essential (primary) hypertension: Secondary | ICD-10-CM | POA: Diagnosis present

## 2023-06-17 DIAGNOSIS — M5416 Radiculopathy, lumbar region: Secondary | ICD-10-CM

## 2023-06-17 DIAGNOSIS — K5900 Constipation, unspecified: Secondary | ICD-10-CM | POA: Diagnosis not present

## 2023-06-17 DIAGNOSIS — M48061 Spinal stenosis, lumbar region without neurogenic claudication: Secondary | ICD-10-CM | POA: Diagnosis present

## 2023-06-17 DIAGNOSIS — I059 Rheumatic mitral valve disease, unspecified: Principal | ICD-10-CM | POA: Insufficient documentation

## 2023-06-17 DIAGNOSIS — I452 Bifascicular block: Secondary | ICD-10-CM | POA: Diagnosis present

## 2023-06-17 DIAGNOSIS — Q244 Congenital subaortic stenosis: Secondary | ICD-10-CM

## 2023-06-17 DIAGNOSIS — I97618 Postprocedural hemorrhage and hematoma of a circulatory system organ or structure following other circulatory system procedure: Secondary | ICD-10-CM | POA: Diagnosis not present

## 2023-06-17 DIAGNOSIS — Z1152 Encounter for screening for COVID-19: Secondary | ICD-10-CM

## 2023-06-17 DIAGNOSIS — R932 Abnormal findings on diagnostic imaging of liver and biliary tract: Secondary | ICD-10-CM

## 2023-06-17 DIAGNOSIS — Z713 Dietary counseling and surveillance: Secondary | ICD-10-CM

## 2023-06-17 DIAGNOSIS — T508X5A Adverse effect of diagnostic agents, initial encounter: Secondary | ICD-10-CM | POA: Diagnosis not present

## 2023-06-17 DIAGNOSIS — Z5941 Food insecurity: Secondary | ICD-10-CM

## 2023-06-17 DIAGNOSIS — D120 Benign neoplasm of cecum: Secondary | ICD-10-CM | POA: Diagnosis present

## 2023-06-17 DIAGNOSIS — D62 Acute posthemorrhagic anemia: Secondary | ICD-10-CM | POA: Diagnosis not present

## 2023-06-17 DIAGNOSIS — E8809 Other disorders of plasma-protein metabolism, not elsewhere classified: Secondary | ICD-10-CM | POA: Diagnosis present

## 2023-06-17 DIAGNOSIS — Z5982 Transportation insecurity: Secondary | ICD-10-CM

## 2023-06-17 DIAGNOSIS — R7881 Bacteremia: Secondary | ICD-10-CM | POA: Diagnosis present

## 2023-06-17 DIAGNOSIS — K3189 Other diseases of stomach and duodenum: Secondary | ICD-10-CM | POA: Diagnosis present

## 2023-06-17 DIAGNOSIS — K635 Polyp of colon: Secondary | ICD-10-CM | POA: Diagnosis present

## 2023-06-17 DIAGNOSIS — I051 Rheumatic mitral insufficiency: Secondary | ICD-10-CM | POA: Diagnosis present

## 2023-06-17 DIAGNOSIS — E872 Acidosis, unspecified: Secondary | ICD-10-CM | POA: Diagnosis present

## 2023-06-17 DIAGNOSIS — B954 Other streptococcus as the cause of diseases classified elsewhere: Secondary | ICD-10-CM | POA: Diagnosis present

## 2023-06-17 DIAGNOSIS — K5730 Diverticulosis of large intestine without perforation or abscess without bleeding: Secondary | ICD-10-CM | POA: Diagnosis present

## 2023-06-17 DIAGNOSIS — Z6829 Body mass index (BMI) 29.0-29.9, adult: Secondary | ICD-10-CM

## 2023-06-17 DIAGNOSIS — B3781 Candidal esophagitis: Secondary | ICD-10-CM | POA: Diagnosis not present

## 2023-06-17 DIAGNOSIS — Z79899 Other long term (current) drug therapy: Secondary | ICD-10-CM

## 2023-06-17 DIAGNOSIS — E781 Pure hyperglyceridemia: Secondary | ICD-10-CM | POA: Diagnosis present

## 2023-06-17 DIAGNOSIS — Z8249 Family history of ischemic heart disease and other diseases of the circulatory system: Secondary | ICD-10-CM

## 2023-06-17 DIAGNOSIS — I444 Left anterior fascicular block: Secondary | ICD-10-CM | POA: Diagnosis present

## 2023-06-17 DIAGNOSIS — E1169 Type 2 diabetes mellitus with other specified complication: Secondary | ICD-10-CM | POA: Diagnosis present

## 2023-06-17 DIAGNOSIS — I340 Nonrheumatic mitral (valve) insufficiency: Secondary | ICD-10-CM

## 2023-06-17 DIAGNOSIS — E663 Overweight: Secondary | ICD-10-CM | POA: Diagnosis present

## 2023-06-17 DIAGNOSIS — J9811 Atelectasis: Secondary | ICD-10-CM | POA: Diagnosis not present

## 2023-06-17 DIAGNOSIS — D126 Benign neoplasm of colon, unspecified: Secondary | ICD-10-CM

## 2023-06-17 DIAGNOSIS — I330 Acute and subacute infective endocarditis: Principal | ICD-10-CM | POA: Diagnosis present

## 2023-06-17 DIAGNOSIS — M545 Low back pain, unspecified: Secondary | ICD-10-CM | POA: Diagnosis not present

## 2023-06-17 DIAGNOSIS — I2510 Atherosclerotic heart disease of native coronary artery without angina pectoris: Secondary | ICD-10-CM | POA: Diagnosis present

## 2023-06-17 DIAGNOSIS — Z7982 Long term (current) use of aspirin: Secondary | ICD-10-CM

## 2023-06-17 DIAGNOSIS — Z716 Tobacco abuse counseling: Secondary | ICD-10-CM

## 2023-06-17 DIAGNOSIS — E875 Hyperkalemia: Secondary | ICD-10-CM | POA: Diagnosis not present

## 2023-06-17 DIAGNOSIS — Z791 Long term (current) use of non-steroidal anti-inflammatories (NSAID): Secondary | ICD-10-CM

## 2023-06-17 DIAGNOSIS — Z952 Presence of prosthetic heart valve: Secondary | ICD-10-CM

## 2023-06-17 DIAGNOSIS — I5031 Acute diastolic (congestive) heart failure: Secondary | ICD-10-CM | POA: Diagnosis present

## 2023-06-17 DIAGNOSIS — K7460 Unspecified cirrhosis of liver: Secondary | ICD-10-CM | POA: Diagnosis present

## 2023-06-17 HISTORY — DX: Obstructive hypertrophic cardiomyopathy: I42.1

## 2023-06-17 HISTORY — DX: Atherosclerotic heart disease of native coronary artery without angina pectoris: I25.10

## 2023-06-17 HISTORY — DX: Nonrheumatic mitral (valve) insufficiency: I34.0

## 2023-06-17 LAB — CBC
HCT: 26.9 % — ABNORMAL LOW (ref 39.0–52.0)
Hemoglobin: 8.3 g/dL — ABNORMAL LOW (ref 13.0–17.0)
MCH: 29.6 pg (ref 26.0–34.0)
MCHC: 30.9 g/dL (ref 30.0–36.0)
MCV: 96.1 fL (ref 80.0–100.0)
Platelets: 142 10*3/uL — ABNORMAL LOW (ref 150–400)
RBC: 2.8 MIL/uL — ABNORMAL LOW (ref 4.22–5.81)
RDW: 15.5 % (ref 11.5–15.5)
WBC: 9.6 10*3/uL (ref 4.0–10.5)
nRBC: 0 % (ref 0.0–0.2)

## 2023-06-17 LAB — BASIC METABOLIC PANEL
Anion gap: 8 (ref 5–15)
BUN: 7 mg/dL (ref 6–20)
CO2: 22 mmol/L (ref 22–32)
Calcium: 8.5 mg/dL — ABNORMAL LOW (ref 8.9–10.3)
Chloride: 97 mmol/L — ABNORMAL LOW (ref 98–111)
Creatinine, Ser: 0.63 mg/dL (ref 0.61–1.24)
GFR, Estimated: >60 mL/min (ref >60)
Glucose, Bld: 134 mg/dL — ABNORMAL HIGH (ref 70–99)
Potassium: 3.7 mmol/L (ref 3.5–5.1)
Sodium: 127 mmol/L — ABNORMAL LOW (ref 135–145)

## 2023-06-17 LAB — BRAIN NATRIURETIC PEPTIDE: B Natriuretic Peptide: 592.9 pg/mL — ABNORMAL HIGH (ref 0.0–100.0)

## 2023-06-17 NOTE — ED Triage Notes (Signed)
BLE swelling, shortness of breath aggravated with movement and generalized chest pains x 1 week.

## 2023-06-18 ENCOUNTER — Encounter (HOSPITAL_COMMUNITY): Payer: Self-pay | Admitting: Internal Medicine

## 2023-06-18 ENCOUNTER — Inpatient Hospital Stay (HOSPITAL_COMMUNITY): Payer: Medicaid Other

## 2023-06-18 DIAGNOSIS — E663 Overweight: Secondary | ICD-10-CM | POA: Diagnosis present

## 2023-06-18 DIAGNOSIS — R0602 Shortness of breath: Principal | ICD-10-CM | POA: Diagnosis present

## 2023-06-18 DIAGNOSIS — I5033 Acute on chronic diastolic (congestive) heart failure: Secondary | ICD-10-CM | POA: Diagnosis not present

## 2023-06-18 DIAGNOSIS — K3189 Other diseases of stomach and duodenum: Secondary | ICD-10-CM | POA: Diagnosis not present

## 2023-06-18 DIAGNOSIS — I5031 Acute diastolic (congestive) heart failure: Secondary | ICD-10-CM | POA: Diagnosis present

## 2023-06-18 DIAGNOSIS — D696 Thrombocytopenia, unspecified: Secondary | ICD-10-CM | POA: Diagnosis not present

## 2023-06-18 DIAGNOSIS — E8809 Other disorders of plasma-protein metabolism, not elsewhere classified: Secondary | ICD-10-CM | POA: Diagnosis present

## 2023-06-18 DIAGNOSIS — I452 Bifascicular block: Secondary | ICD-10-CM | POA: Diagnosis present

## 2023-06-18 DIAGNOSIS — L2989 Other pruritus: Secondary | ICD-10-CM | POA: Diagnosis not present

## 2023-06-18 DIAGNOSIS — I110 Hypertensive heart disease with heart failure: Secondary | ICD-10-CM | POA: Diagnosis not present

## 2023-06-18 DIAGNOSIS — Z1152 Encounter for screening for COVID-19: Secondary | ICD-10-CM | POA: Diagnosis not present

## 2023-06-18 DIAGNOSIS — I3139 Other pericardial effusion (noninflammatory): Secondary | ICD-10-CM | POA: Diagnosis not present

## 2023-06-18 DIAGNOSIS — K2970 Gastritis, unspecified, without bleeding: Secondary | ICD-10-CM | POA: Diagnosis not present

## 2023-06-18 DIAGNOSIS — J9811 Atelectasis: Secondary | ICD-10-CM | POA: Diagnosis not present

## 2023-06-18 DIAGNOSIS — I059 Rheumatic mitral valve disease, unspecified: Secondary | ICD-10-CM | POA: Diagnosis not present

## 2023-06-18 DIAGNOSIS — I339 Acute and subacute endocarditis, unspecified: Secondary | ICD-10-CM | POA: Diagnosis not present

## 2023-06-18 DIAGNOSIS — I10 Essential (primary) hypertension: Secondary | ICD-10-CM | POA: Diagnosis not present

## 2023-06-18 DIAGNOSIS — I340 Nonrheumatic mitral (valve) insufficiency: Secondary | ICD-10-CM | POA: Diagnosis not present

## 2023-06-18 DIAGNOSIS — D123 Benign neoplasm of transverse colon: Secondary | ICD-10-CM | POA: Diagnosis not present

## 2023-06-18 DIAGNOSIS — E871 Hypo-osmolality and hyponatremia: Secondary | ICD-10-CM | POA: Diagnosis not present

## 2023-06-18 DIAGNOSIS — M5416 Radiculopathy, lumbar region: Secondary | ICD-10-CM | POA: Diagnosis not present

## 2023-06-18 DIAGNOSIS — F1090 Alcohol use, unspecified, uncomplicated: Secondary | ICD-10-CM | POA: Diagnosis present

## 2023-06-18 DIAGNOSIS — K7460 Unspecified cirrhosis of liver: Secondary | ICD-10-CM | POA: Diagnosis not present

## 2023-06-18 DIAGNOSIS — Q244 Congenital subaortic stenosis: Secondary | ICD-10-CM | POA: Diagnosis not present

## 2023-06-18 DIAGNOSIS — I2489 Other forms of acute ischemic heart disease: Secondary | ICD-10-CM | POA: Diagnosis not present

## 2023-06-18 DIAGNOSIS — D120 Benign neoplasm of cecum: Secondary | ICD-10-CM | POA: Diagnosis not present

## 2023-06-18 DIAGNOSIS — D62 Acute posthemorrhagic anemia: Secondary | ICD-10-CM | POA: Diagnosis not present

## 2023-06-18 DIAGNOSIS — E872 Acidosis, unspecified: Secondary | ICD-10-CM | POA: Diagnosis present

## 2023-06-18 DIAGNOSIS — I97618 Postprocedural hemorrhage and hematoma of a circulatory system organ or structure following other circulatory system procedure: Secondary | ICD-10-CM | POA: Diagnosis not present

## 2023-06-18 DIAGNOSIS — I330 Acute and subacute infective endocarditis: Secondary | ICD-10-CM | POA: Diagnosis not present

## 2023-06-18 DIAGNOSIS — Q248 Other specified congenital malformations of heart: Secondary | ICD-10-CM | POA: Diagnosis not present

## 2023-06-18 DIAGNOSIS — I97610 Postprocedural hemorrhage and hematoma of a circulatory system organ or structure following a cardiac catheterization: Secondary | ICD-10-CM | POA: Diagnosis not present

## 2023-06-18 DIAGNOSIS — I350 Nonrheumatic aortic (valve) stenosis: Secondary | ICD-10-CM | POA: Diagnosis not present

## 2023-06-18 DIAGNOSIS — I2510 Atherosclerotic heart disease of native coronary artery without angina pectoris: Secondary | ICD-10-CM | POA: Diagnosis not present

## 2023-06-18 DIAGNOSIS — I051 Rheumatic mitral insufficiency: Secondary | ICD-10-CM | POA: Diagnosis not present

## 2023-06-18 DIAGNOSIS — E1169 Type 2 diabetes mellitus with other specified complication: Secondary | ICD-10-CM | POA: Diagnosis not present

## 2023-06-18 DIAGNOSIS — I5021 Acute systolic (congestive) heart failure: Secondary | ICD-10-CM | POA: Diagnosis not present

## 2023-06-18 DIAGNOSIS — R748 Abnormal levels of other serum enzymes: Secondary | ICD-10-CM | POA: Diagnosis not present

## 2023-06-18 DIAGNOSIS — R7881 Bacteremia: Secondary | ICD-10-CM | POA: Diagnosis not present

## 2023-06-18 DIAGNOSIS — I509 Heart failure, unspecified: Secondary | ICD-10-CM | POA: Diagnosis not present

## 2023-06-18 DIAGNOSIS — I421 Obstructive hypertrophic cardiomyopathy: Secondary | ICD-10-CM | POA: Diagnosis present

## 2023-06-18 DIAGNOSIS — R079 Chest pain, unspecified: Secondary | ICD-10-CM | POA: Diagnosis not present

## 2023-06-18 DIAGNOSIS — K5730 Diverticulosis of large intestine without perforation or abscess without bleeding: Secondary | ICD-10-CM | POA: Diagnosis not present

## 2023-06-18 DIAGNOSIS — M4620 Osteomyelitis of vertebra, site unspecified: Secondary | ICD-10-CM | POA: Diagnosis not present

## 2023-06-18 DIAGNOSIS — Y838 Other surgical procedures as the cause of abnormal reaction of the patient, or of later complication, without mention of misadventure at the time of the procedure: Secondary | ICD-10-CM | POA: Diagnosis not present

## 2023-06-18 DIAGNOSIS — B954 Other streptococcus as the cause of diseases classified elsewhere: Secondary | ICD-10-CM | POA: Diagnosis not present

## 2023-06-18 DIAGNOSIS — I444 Left anterior fascicular block: Secondary | ICD-10-CM | POA: Diagnosis present

## 2023-06-18 DIAGNOSIS — D509 Iron deficiency anemia, unspecified: Secondary | ICD-10-CM | POA: Diagnosis not present

## 2023-06-18 DIAGNOSIS — R932 Abnormal findings on diagnostic imaging of liver and biliary tract: Secondary | ICD-10-CM | POA: Diagnosis not present

## 2023-06-18 DIAGNOSIS — B3781 Candidal esophagitis: Secondary | ICD-10-CM | POA: Diagnosis not present

## 2023-06-18 LAB — ECHOCARDIOGRAM COMPLETE
AV Mean grad: 22 mm[Hg]
AV Peak grad: 37.2 mm[Hg]
Ao pk vel: 3.05 m/s
Area-P 1/2: 3.46 cm2
Height: 67 in
S' Lateral: 2.7 cm
Weight: 3040.58 [oz_av]

## 2023-06-18 LAB — HEMOGLOBIN A1C
Hgb A1c MFr Bld: 5.9 % — ABNORMAL HIGH (ref 4.8–5.6)
Mean Plasma Glucose: 122.63 mg/dL

## 2023-06-18 LAB — RETICULOCYTES
Immature Retic Fract: 26.9 % — ABNORMAL HIGH (ref 2.3–15.9)
RBC.: 2.7 MIL/uL — ABNORMAL LOW (ref 4.22–5.81)
Retic Count, Absolute: 61.6 10*3/uL (ref 19.0–186.0)
Retic Ct Pct: 2.3 % (ref 0.4–3.1)

## 2023-06-18 LAB — TROPONIN I (HIGH SENSITIVITY)
Troponin I (High Sensitivity): 60 ng/L — ABNORMAL HIGH (ref <18)
Troponin I (High Sensitivity): 53 ng/L — ABNORMAL HIGH (ref <18)

## 2023-06-18 LAB — IRON AND TIBC
Iron: 18 ug/dL — ABNORMAL LOW (ref 45–182)
Saturation Ratios: 7 % — ABNORMAL LOW (ref 17.9–39.5)
TIBC: 255 ug/dL (ref 250–450)
UIBC: 237 ug/dL

## 2023-06-18 LAB — POC OCCULT BLOOD, ED: Fecal Occult Bld: NEGATIVE

## 2023-06-18 LAB — FERRITIN: Ferritin: 248 ng/mL (ref 24–336)

## 2023-06-18 LAB — HIV ANTIBODY (ROUTINE TESTING W REFLEX): HIV Screen 4th Generation wRfx: NONREACTIVE

## 2023-06-18 LAB — FOLATE: Folate: 9.8 ng/mL (ref >5.9)

## 2023-06-18 LAB — VITAMIN B12: Vitamin B-12: 540 pg/mL (ref 180–914)

## 2023-06-18 LAB — TSH: TSH: 1.613 u[IU]/mL (ref 0.350–4.500)

## 2023-06-18 MED ORDER — FOLIC ACID 1 MG PO TABS
1.0000 mg | ORAL_TABLET | Freq: Every day | ORAL | Status: DC
Start: 2023-06-18 — End: 2023-07-08
  Administered 2023-06-18 – 2023-07-08 (×19): 1 mg via ORAL
  Filled 2023-06-18 (×19): qty 1

## 2023-06-18 MED ORDER — FUROSEMIDE 10 MG/ML IJ SOLN
40.0000 mg | Freq: Two times a day (BID) | INTRAMUSCULAR | Status: DC
  Administered 2023-06-18 – 2023-06-20 (×4): 40 mg via INTRAVENOUS
  Filled 2023-06-18 (×4): qty 4

## 2023-06-18 MED ORDER — SODIUM CHLORIDE 0.9% FLUSH
3.0000 mL | Freq: Two times a day (BID) | INTRAVENOUS | Status: DC
Start: 2023-06-18 — End: 2023-06-24
  Administered 2023-06-18 – 2023-06-23 (×11): 3 mL via INTRAVENOUS

## 2023-06-18 MED ORDER — FUROSEMIDE 10 MG/ML IJ SOLN
40.0000 mg | Freq: Once | INTRAMUSCULAR | Status: AC
  Administered 2023-06-18: 40 mg via INTRAVENOUS
  Filled 2023-06-18: qty 4

## 2023-06-18 MED ORDER — AMLODIPINE BESYLATE 5 MG PO TABS: 10.0000 mg | ORAL_TABLET | Freq: Every day | ORAL | Status: DC

## 2023-06-18 MED ORDER — METHOCARBAMOL 500 MG PO TABS
500.0000 mg | ORAL_TABLET | Freq: Three times a day (TID) | ORAL | Status: DC
  Administered 2023-06-18 – 2023-07-08 (×58): 500 mg via ORAL
  Filled 2023-06-18 (×58): qty 1

## 2023-06-18 MED ORDER — GUAIFENESIN 100 MG/5ML PO LIQD
5.0000 mL | ORAL | Status: DC | PRN
  Administered 2023-06-18 (×2): 5 mL via ORAL
  Filled 2023-06-18 (×2): qty 10

## 2023-06-18 MED ORDER — ONDANSETRON HCL 4 MG/2ML IJ SOLN
4.0000 mg | Freq: Four times a day (QID) | INTRAMUSCULAR | Status: DC | PRN
  Administered 2023-06-21: 4 mg via INTRAVENOUS
  Filled 2023-06-18: qty 2

## 2023-06-18 MED ORDER — SODIUM CHLORIDE 0.9% FLUSH: 3.0000 mL | INTRAVENOUS | Status: DC | PRN

## 2023-06-18 MED ORDER — GABAPENTIN 300 MG PO CAPS
300.0000 mg | ORAL_CAPSULE | Freq: Three times a day (TID) | ORAL | Status: DC
  Administered 2023-06-18 – 2023-07-14 (×76): 300 mg via ORAL
  Filled 2023-06-18 (×76): qty 1

## 2023-06-18 MED ORDER — ROSUVASTATIN CALCIUM 20 MG PO TABS
20.0000 mg | ORAL_TABLET | Freq: Every day | ORAL | Status: DC
  Administered 2023-06-18 – 2023-06-26 (×9): 20 mg via ORAL
  Filled 2023-06-18 (×9): qty 1

## 2023-06-18 MED ORDER — POTASSIUM CHLORIDE CRYS ER 20 MEQ PO TBCR
40.0000 meq | EXTENDED_RELEASE_TABLET | Freq: Every day | ORAL | Status: DC
  Administered 2023-06-18 – 2023-06-25 (×8): 40 meq via ORAL
  Filled 2023-06-18 (×8): qty 2

## 2023-06-18 MED ORDER — DOCUSATE SODIUM 100 MG PO CAPS
100.0000 mg | ORAL_CAPSULE | Freq: Two times a day (BID) | ORAL | Status: DC
Start: 2023-06-18 — End: 2023-07-02
  Administered 2023-06-18 – 2023-07-01 (×27): 100 mg via ORAL
  Filled 2023-06-18 (×27): qty 1

## 2023-06-18 MED ORDER — SODIUM CHLORIDE 0.9 % IV SOLN: 250.0000 mL | INTRAVENOUS | Status: AC | PRN

## 2023-06-18 MED ORDER — MENTHOL 3 MG MT LOZG
1.0000 | LOZENGE | OROMUCOSAL | Status: DC | PRN
  Filled 2023-06-18: qty 9

## 2023-06-18 MED ORDER — OXYCODONE HCL 5 MG PO TABS
5.0000 mg | ORAL_TABLET | ORAL | Status: DC | PRN
  Administered 2023-06-18 – 2023-07-01 (×11): 5 mg via ORAL
  Filled 2023-06-18 (×15): qty 1

## 2023-06-18 MED ORDER — HYDRALAZINE HCL 20 MG/ML IJ SOLN: 10.0000 mg | INTRAMUSCULAR | Status: DC | PRN

## 2023-06-18 MED ORDER — THIAMINE MONONITRATE 100 MG PO TABS
100.0000 mg | ORAL_TABLET | Freq: Every day | ORAL | Status: DC
Start: 2023-06-18 — End: 2023-07-08
  Administered 2023-06-18 – 2023-07-08 (×19): 100 mg via ORAL
  Filled 2023-06-18 (×19): qty 1

## 2023-06-18 MED ORDER — ONDANSETRON HCL 4 MG PO TABS
4.0000 mg | ORAL_TABLET | Freq: Four times a day (QID) | ORAL | Status: DC | PRN
  Filled 2023-06-18: qty 1

## 2023-06-18 MED ORDER — ADULT MULTIVITAMIN W/MINERALS CH
1.0000 | ORAL_TABLET | Freq: Every day | ORAL | Status: DC
Start: 2023-06-18 — End: 2023-07-08
  Administered 2023-06-18 – 2023-07-08 (×19): 1 via ORAL
  Filled 2023-06-18 (×19): qty 1

## 2023-06-18 MED ORDER — LOSARTAN POTASSIUM 50 MG PO TABS: 50.0000 mg | ORAL_TABLET | Freq: Every day | ORAL | Status: DC

## 2023-06-18 NOTE — Evaluation (Addendum)
Occupational Therapy Evaluation Patient Details Name: Victor [name] MRN: [id number] DOB: 09-27-1970 Today's Date: 06/18/2023   History of Present Illness 52 years old male with past medical history of hyperlipidemia, obesity, hypertension, and chronic back pain presented to hospital 11/26 with exertional shortness of breath, bilateral lower extremity edema and pain for the last 2 weeks.  Patient also complains of shortness of breath at rest and has been having difficulty lying down in bed.   Clinical Impression   Patient admitted for the diagnosis above.  PTA he lives at home alone, and despite low back pain, he remained Mod I with mobility at a [facility] level, and continued to perform all ADL and iADL.  Deficits include continued back discomfort and SOB with exertion.  Currently he is needing up to CGA for mobility at [facility] level and lower body ADL from a sit to stand level.  OT can follow in the acute setting to address deficits, but no post acute OT is anticipated.          If plan is discharge home, recommend the following: Assistance with cooking/housework    Functional Status Assessment  Patient has had a recent decline in their functional status and demonstrates the ability to make significant improvements in function in a reasonable and predictable amount of time.  Equipment Recommendations  None recommended by OT    Recommendations for Other Services       Precautions / Restrictions Precautions Precautions: Fall Restrictions Weight Bearing Restrictions: No      Mobility Bed Mobility Overal bed mobility: Needs Assistance Bed Mobility: Supine to Sit     Supine to sit: Min assist, Mod assist     General bed mobility comments: Usually sleeps in a recliner    Transfers Overall transfer level: Needs assistance   Transfers: Sit to/from Stand, Bed to chair/wheelchair/BSC Sit to Stand: Contact guard assist     Step pivot transfers: Supervision            Balance  Overall balance assessment: Needs assistance Sitting-balance support: Feet supported Sitting balance-Leahy Scale: Good     Standing balance support: Single extremity supported Standing balance-Leahy Scale: Fair                             ADL either performed or assessed with clinical judgement   ADL       Grooming: Wash/dry hands;Supervision/safety;Standing               Lower Body Dressing: Contact guard assist;Sit to/from stand   Toilet Transfer: Contact guard assist;Supervision/safety;Ambulation;Regular Toilet                   Vision Patient Visual Report: No change from baseline       Perception Perception: Within Functional Limits       Praxis Praxis: [facility]       Pertinent Vitals/Pain Pain Assessment Pain Assessment: Faces Faces Pain Scale: Hurts little more Pain Location: low back.  Is followed by NeuroSpine MD.  Was scheduled for spine injection. Pain Descriptors / Indicators: Shooting, Sharp Pain Intervention(s): Monitored during session     Extremity/Trunk Assessment Upper Extremity Assessment Upper Extremity Assessment: Overall WFL for tasks assessed   Lower Extremity Assessment Lower Extremity Assessment: Defer to PT evaluation   Cervical / Trunk Assessment Cervical / Trunk Assessment: Normal   Communication Communication Communication: No apparent difficulties   Cognition Arousal: Alert Behavior During Therapy: [facility] for tasks assessed/performed  Overall Cognitive Status: Within Functional Limits for tasks assessed                                       General Comments   HR to 104 with mobility    Exercises     Shoulder Instructions      Home Living Family/patient expects to be discharged to:: Private residence Living Arrangements: Alone   Type of Home: House Home Access: Stairs to enter [employer] of Steps: 4 Entrance Stairs-Rails: Right;Left Home Layout: One level     Bathroom  Shower/Tub: Chief Strategy Officer: Standard Bathroom Accessibility: Yes How Accessible: Accessible via walker Home Equipment: Cane - single point          Prior Functioning/Environment Prior Level of Function : Independent/Modified Independent;Working/employed;Driving             Mobility Comments: Uses a SPC for mobility due to chronic back pain ADLs Comments: Mod I for ADL, iADL.  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        OT Problem List: Decreased activity tolerance;Impaired balance (sitting and/or standing);Pain      OT Treatment/Interventions: Self-care/ADL training;Therapeutic activities;Balance training;Energy conservation    OT Goals(Current goals can be found in the care plan section) Acute Rehab OT Goals Patient Stated Goal: Feel better, and return home OT Goal Formulation: With patient Time For Goal Achievement: 07/01/23 Potential to Achieve Goals: Good ADL Goals Pt Will Perform Grooming: with modified independence;standing Pt Will Perform Lower Body Dressing: with modified independence;sit to/from stand Pt Will Transfer to Toilet: with modified independence;regular height toilet;ambulating  OT Frequency: Min 1X/week    Co-evaluation              AM-PAC OT "6 Clicks" Daily Activity     Outcome Measure Help from another person eating meals?: None Help from another person taking care of personal grooming?: None Help from another person toileting, which includes using toliet, bedpan, or urinal?: A Little Help from another person bathing (including washing, rinsing, drying)?: A Little Help from another person to put on and taking off regular upper body clothing?: None Help from another person to put on and taking off regular lower body clothing?: A Little 6 Click Score: 21   End of Session Equipment Utilized During Treatment: Other (comment) [facility]) Nurse  Communication: Mobility status  Activity Tolerance: Patient tolerated treatment well Patient left: in chair;with call bell/phone within reach  OT Visit Diagnosis: Unsteadiness on feet (R26.81)                Time: 6962-9528 OT Time Calculation (min): 22 min Charges:  OT General Charges $OT Visit: 1 Visit OT Evaluation $OT Eval Moderate Complexity: 1 Mod  06/18/2023  RP, OTR/L  Acute Rehabilitation Services  Office:  [phone]   Suzanna Obey 06/18/2023, 4:41 PM

## 2023-06-18 NOTE — ED Provider Notes (Signed)
[place] EMERGENCY DEPARTMENT AT [facility] Provider Note   CSN: [id number] Arrival date & time: 06/17/23  2156     History  Chief Complaint  Patient presents with   Shortness of Breath    Victor [name] is a 52 y.o. male.   Shortness of Breath Patient presents to the ED today complaining exertional shortness of breath, bilateral lower leg edema, bilateral lower leg pain.  He states that this began 2 weeks ago and got progressively worse.  He says that he occasionally has chest pain when eating spicy, acidic foods.  But does not have chest pain currently.  He says he also has shortness of breath when walking but not at rest.  Not when lying down.  He states he took 3 diuretic pills sometime last week but has not taken any diuretics since.  Endorses 1 episode of diarrhea yesterday.  States that when taking prednisone he did have dark stools.  But is unaware if stool looks different currently.  Denies previous history of DVT.     Home Medications Prior to Admission medications   Medication Sig Start Date End Date Taking? Authorizing Provider  meloxicam (MOBIC) 15 MG tablet Take 1 tablet (15 mg total) by mouth daily. 05/23/23   Juanda Chance, NP  methocarbamol (ROBAXIN) 500 MG tablet Take 1 tablet (500 mg total) by mouth 3 (three) times daily. 05/23/23   Juanda Chance, NP  amLODipine (NORVASC) 10 MG tablet Take 1 tablet (10 mg total) by mouth daily. 03/22/23   Gwyneth Sprout, MD  aspirin 81 MG EC tablet Take 1 tablet (81 mg total) by mouth daily. 07/27/20   Claiborne Rigg, NP  diclofenac (VOLTAREN) 75 MG EC tablet Take 1 tablet (75 mg total) by mouth 2 (two) times daily. 05/07/23   Storm Frisk, MD  docusate sodium (COLACE) 100 MG capsule Take 1 capsule (100 mg total) by mouth every 12 (twelve) hours. 12/31/19   Mesner, Barbara Cower, MD  furosemide (LASIX) 20 MG tablet Take 1 tablet (20 mg total) by mouth daily. 06/08/23   Claiborne Rigg, NP  gabapentin (NEURONTIN)  300 MG capsule Take 1 capsule (300 mg total) by mouth 3 (three) times daily. 05/07/23   Storm Frisk, MD  gemfibrozil (LOPID) 600 MG tablet Take 1 tablet (600 mg total) by mouth 2 (two) times daily before a meal. 03/22/23   Gwyneth Sprout, MD  losartan (COZAAR) 50 MG tablet Take 1 tablet (50 mg total) by mouth daily. 10/15/22   Hoy Register, MD  predniSONE (DELTASONE) 50 MG tablet Take 1 tablet (50 mg total) by mouth daily with breakfast. Take until completed. 05/15/23   Juanda Chance, NP  rosuvastatin (CRESTOR) 20 MG tablet Take 1 tablet (20 mg total) by mouth daily. 03/22/23   Gwyneth Sprout, MD  tiZANidine (ZANAFLEX) 4 MG tablet Take 1 tablet (4 mg total) by mouth every 6 (six) hours as needed for muscle spasms. 05/07/23   Storm Frisk, MD      Allergies    Patient has no known allergies.    Review of Systems   Review of Systems  Respiratory:  Positive for shortness of breath.     Physical Exam Updated Vital Signs BP 131/71   Pulse 89   Temp 98.9 F (37.2 C) (Oral)   Resp 15   Ht 5\' 6"  (1.676 m)   Wt 90.7 kg   SpO2 99%   BMI 32.28 kg/m  Physical Exam Vitals and  nursing note reviewed.  Constitutional:      Appearance: Normal appearance.  HENT:     Head: Normocephalic and atraumatic.  Eyes:     Extraocular Movements: Extraocular movements intact.     Conjunctiva/sclera: Conjunctivae normal.  Cardiovascular:     Rate and Rhythm: Normal rate and regular rhythm.     Pulses: Normal pulses.     Heart sounds: Murmur heard.  Pulmonary:     Effort: Pulmonary effort is normal. No tachypnea, bradypnea, accessory muscle usage or respiratory distress.     Breath sounds: No decreased breath sounds, wheezing, rhonchi or rales.  Chest:     Chest wall: No edema.  Abdominal:     General: Abdomen is flat.     Palpations: Abdomen is soft.     Tenderness: There is no abdominal tenderness.  Skin:    General: Skin is warm and dry.  Neurological:     General: No  focal deficit present.     Mental Status: He is alert. Mental status is at baseline.  Psychiatric:        Mood and Affect: Mood normal.     ED Results / Procedures / Treatments   Labs (all labs ordered are listed, but only abnormal results are displayed) Labs Reviewed  BASIC METABOLIC PANEL - Abnormal; Notable for the following components:      Result Value   Sodium 127 (*)    Chloride 97 (*)    Glucose, Bld 134 (*)    Calcium 8.5 (*)    All other components within normal limits  CBC - Abnormal; Notable for the following components:   RBC 2.80 (*)    Hemoglobin 8.3 (*)    HCT 26.9 (*)    Platelets 142 (*)    All other components within normal limits  BRAIN NATRIURETIC PEPTIDE - Abnormal; Notable for the following components:   B Natriuretic Peptide 592.9 (*)    All other components within normal limits  TROPONIN I (HIGH SENSITIVITY) - Abnormal; Notable for the following components:   Troponin I (High Sensitivity) 60 (*)    All other components within normal limits  POC OCCULT BLOOD, ED    EKG EKG Interpretation Date/Time:  Monday June 17 2023 22:41:56 EST Ventricular Rate:  108 PR Interval:  138 QRS Duration:  128 QT Interval:  394 QTC Calculation: 527 R Axis:   -66  Text Interpretation: Sinus tachycardia Right bundle branch block Left anterior fascicular block Bifascicular block Left ventricular hypertrophy with repolarization abnormality ( R in aVL , Romhilt-Estes ) Abnormal ECG Confirmed by Tilden Fossa [id number]) on 06/18/2023 3:29:31 AM  Radiology DG Chest 2 View  Result Date: 06/17/2023 CLINICAL DATA:  Chest pain, shortness of breath EXAM: CHEST - 2 VIEW COMPARISON:  03/22/2023 FINDINGS: The heart size and mediastinal contours are within normal limits. Both lungs are clear. The visualized skeletal structures are unremarkable. IMPRESSION: Normal study. Electronically Signed   By: Charlett Nose M.D.   On: 06/17/2023 23:24    Procedures Procedures     Medications Ordered in ED Medications  furosemide (LASIX) injection 40 mg (40 mg Intravenous Given 06/18/23 0554)    ED Course/ Medical Decision Making/ A&P                                 Medical Decision Making Amount and/or Complexity of Data Reviewed Labs: ordered. Radiology: ordered.     Patient presents to  the ED for concern of bilateral lower extremity edema , pain, and exertional dyspnea this involves an extensive number of treatment options, and is a complaint that carries with it a high risk of complications and morbidity.  The differential diagnosis includes heart failure, GI bleed, PE, ACS, DVT.   Co morbidities that complicate the patient evaluation  Hypertension   Additional history obtained:  Additional history obtained from  Nursing, Outside Medical Records, and Past Admission     Lab Tests:  I Ordered, and personally interpreted labs.  The pertinent results include:   Troponin 60 BMP 592.9 RBC 2.8 Hemoglobin 8.3 Hematocrit 26.9 Sodium 127   Imaging Studies ordered:  I ordered imaging studies including Chest x-ray  I independently visualized and interpreted imaging which showed unremarkable I agree with the radiologist interpretation   Cardiac Monitoring:  The patient was maintained on a cardiac monitor.  I personally viewed and interpreted the cardiac monitored which showed an underlying rhythm of:  Sinus tachycardia Right bundle branch block Left anterior fascicular block Bifascicular block Left ventricular hypertrophy with repolarization abnormality   Medicines ordered and prescription drug management:  I ordered medication including Lasix for status Reevaluation of the patient after these medicines showed that the patient stayed the same I have reviewed the patients home medicines and have made adjustments as needed   Test Considered:  CT abdomen, TEE, CT angio    Consultations Obtained:  I requested consultation with  the hospitalist,  and discussed lab and imaging findings as well as pertinent plan - they recommend: Admission   Problem List / ED Course:  Acute on chronic heart failure --patient is a 52 year old male who presented to the ED today complaining of ED today complaining exertional shortness of breath, bilateral lower leg edema, bilateral lower leg pain.  He states that this began 2 weeks ago and got progressively worse.  He says that he occasionally has chest pain when eating spicy, acidic foods.  But does not have chest pain currently.  He says he also has shortness of breath when walking but not at rest.  Not when lying down.  He states he took 3 diuretic pills sometime last week but has not taken any diuretics since and uncertain of what they were.  Endorses 1 episode of diarrhea yesterday.  States that when taking prednisone he did have dark stools.  But is unaware if stool looks different currently.  Denies smoking but endorses chewing tobacco.  Physical exam showed a new heart murmur present and edema noted bilaterally in lower limbs.  Lasix 40 mg was given IV to help begin diuresis.  Due to BNP being elevated troponin being elevated sodium being decreased and hemoglobin being 3 points lower than previous, hospitalist were then consulted for admission where patient was admitted to the hospital.  He seems to be in acute on chronic heart failure that could be caused by anemia or some other occult bleed.  Will need further evaluation.  Patient admitted.   Reevaluation:  After the interventions noted above, I reevaluated the patient and found that they have :improved   Dispostion:  After consideration of the diagnostic results and the patients response to treatment, I feel that the patent would benefit from admission.    Final Clinical Impression(s) / ED Diagnoses Final diagnoses:  None    Rx / DC Orders ED Discharge Orders     None         Lunette Stands, [place] 06/18/23 4098  Tilden Fossa, MD 06/19/23 [phone]

## 2023-06-18 NOTE — ED Notes (Signed)
Called pt 3 x no answer

## 2023-06-18 NOTE — Hospital Course (Addendum)
Victor [name] was admitted to the hospital with the working diagnosis of heart failure exacerbation.   52 years old male with past medical history of hyperlipidemia, obesity, hypertension presented to hospital with exertional shortness of breath, bilateral lower extremity edema and pain for the last 2 weeks.  Patient also complains of shortness of breath at rest and has been having difficulty lying down in bed. 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  Cardiothoracic surgery was consulted and they feel that his valve will need to be replaced but want him to go under further testing for likely HOCM.  Cardiac catheterization done and he had minimal nonobstructive CAD with elevated V waves and filling pressures as well as a subaortic gradient with increased PVCs suggesting potential hypertrophic cardiomyopathy.   Cardiac MRI moderate asymmetric basal to mid septal hypertrophy. Suspect LV outflow gradient given narrowed LVOT and turbulence in LVOT.  Positive mitral valve cordal SAM or MV SAM Moderate to severe mitral regurgitation.  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   Underwent MVR 12/10 with Dr. 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   Post operative admitted to the ICU, on mechanical ventilatory support.   12/11: extubated early AM POD 1. Pressors off. Tachypneic with small breaths. +diuretics. Dc swan and a-line   12/14  transferred back to [facility].  12/15 clinically stable, will need outpatient antibiotic therapy, he would like to go to short term rehab.  12/16 pending INR to be therapeutic in order to discharge home, he has no SNF insurance coverage.

## 2023-06-18 NOTE — ED Provider Notes (Signed)
Patient seen in conjunction with orienting PA [facility].  Patient here with SOB and leg swelling.  Concern for either new onset heart failure (no previous documented hx), or perhaps high output heart failure 2/2 anemia.  Patient reported some dark stools a couple of weeks ago and had hx of thrombocytopenia.  He was occult negative today though.  Feel that patient should be admitted.  Case discussed with Dr. Madilyn Hook, who agrees.   Roxy Horseman, PA-C 06/18/23 0536    Tilden Fossa, MD 06/19/23 [phone]

## 2023-06-18 NOTE — Progress Notes (Signed)
  Echocardiogram 2D Echocardiogram has been performed.  Delcie Roch 06/18/2023, 3:30 PM

## 2023-06-18 NOTE — H&P (Addendum)
[facility] History and Physical  Victor [name] IRJ:[id number] DOB: [birth year] DOA: 06/17/2023  Referring physician: ED  PCP: Claiborne Rigg, NP   Patient is coming from: Home  Chief Complaint: Shortness of breath  HPI:  Patient is a 52 years old male with past medical history of hyperlipidemia, obesity, hypertension presented to hospital with exertional shortness of breath, bilateral lower extremity edema and pain for the last 2 weeks.  Patient also complains of shortness of breath at rest and has been having difficulty lying down in bed.  Complains of mild cough with clear sputum production and has subjective fever at home.  Patient stated that he was taking prednisone and analgesics for his back pain and had been to a spine specialist as outpatient and had an MRI done yesterday.  Complains of some shooting pain on the lower legs.  Patient stated that he has been noticing increasing swelling of the legs over the last 2 weeks or so.  Patient denies any obvious blood in the stool but had some dark stools when he was taking prednisone.  His bowels have cleared up now.  He complains of gaseous feeling heartburn as well.  Patient admits to drinking alcohol 16 ounces not every day and denies any withdrawal symptoms from it.  Orthopnea or PND.  Was taking prednisone and analgesics as outpatient and noted some dark stools.  Denies any urinary urgency, frequency or dysuria.  Has not seen further black stools.  He complains of mild chest discomfort especially after eating food with some burning sensation.  Patient has been having difficulty ambulation due to dyspnea.  Denies any dizziness, lightheadedness or syncope.  In the ED, vitals were notable for tachycardia with tachypnea and temperature was borderline at 99.5 F.  Initial labs showed hyponatremia with sodium of 127 hemoglobin low at 8.3 and platelets low at 142.  BNP was elevated at 592.  Initial troponin I was 60.  EKG showed sinus tachycardia  with right bundle branch block.  Chest x-ray did not show any infiltrate.  In the ED patient received 40 mg of IV Lasix and was considered for admission to the hospital for new onset shortness of breath leg swelling.  Assessment plan.    Principal Problem:   Shortness of breath Active Problems:   HTN (hypertension)   Lumbar back pain with radiculopathy affecting lower extremity   Shortness of breath, dyspnea on exertion with lower extremity swelling.  Patient seems to be thinking that this is something new but did have a history of similar complaints in 2015 and had done 2D echocardiogram 2015 with LV function of 65%.  BNP elevated at 592.   Possibility of anemia causing dyspnea as well.  Chest x-ray was negative.   Received 40 milligram of IV of Lasix in the ED.  Will check 2D echocardiogram, intake and output charting, daily weights.  Check thyroid function test.  Continue with IV Lasix 40 twice daily for now.  Was given 20 mg Lasix as outpatient.  Might benefit from cardiology evaluation. Will consult cardiology this time.   Chest discomfort with spicy food.   Mild troponin elevation and flat.  Likely nonspecific or secondary to heart failure.Marland Kitchen  EKG without any ischemic changes.  Add Protonix, Maalox.  History of hypertension.  Continue to monitor closely.  On amlodipine losartan at home.  Will resume.  Anemia.  Hemoglobin of 8.3.  FOBT was negative but reported dark stools.  Will check iron profile.  Continue Protonix.  Might  benefit from iron transfusion.  Hold off with aspirin for now.  Will hold off with NSAIDs for now.  Mild likely chronic thrombocytopenia. Patient drinks alcohol.  No evidence of overt bleeding.   monitor closely.  Platelet borderline low at 142.  Previous level was low at 84.  Hyponatremia.  Uncertain etiology at this time.  Renal function within normal range.  Check TSH.  Could be related to drinking beer.  Check BMP in AM.  Alcohol use disorder.  Drinks 16 ounce  beer probably every day.  Denies any withdrawal symptoms.  Will put the patient on thiamine folic acid multivitamin.  Closely monitor for any withdrawal symptoms.  Low back pain, lumbar radiculopathy had seen a spine specialist as outpatient and MRI of the lumbar spine was ordered on 06/16/2025.  Completed course of prednisone.  Currently taking Tylenol for pain.  Will need to follow-up with orthopedics as outpatient.  Will get PT OT evaluation while in the hospital..  Will add oxycodone, continue Neurontin and muscle relaxant.   DVT Prophylaxis: SCD  Review of Systems:  All systems were reviewed and were negative unless otherwise mentioned in the HPI   Past Medical History:  Diagnosis Date   Dizziness 10/15/2013    "SWIMMY HEAD    "   Hypertension    Hypertriglyceridemia    Obesity    Past Surgical History:  Procedure Laterality Date   INGUINAL HERNIA REPAIR Right 02/03/2021   Procedure: HERNIA REPAIR INGUINAL ADULT;  Surgeon: Diamantina Monks, MD;  Location: [facility] OR;  Service: General;  Laterality: Right;   INSERTION OF MESH Right 02/03/2021   Procedure: INSERTION OF MESH;  Surgeon: Diamantina Monks, MD;  Location: [facility] OR;  Service: General;  Laterality: Right;   NO PAST SURGERIES      Social History:  reports that he has never smoked. His smokeless tobacco use includes chew. He reports current alcohol use. He reports that he does not use drugs.  No Known Allergies  Family History  Problem Relation Age of Onset   CAD Mother        stenting @ 31; alive @ 38   Aneurysm Mother        thoracic s/p surgery @ 72   Lung disease Mother    Other Father        unknown     Prior to Admission medications   Medication Sig Start Date End Date Taking? Authorizing Provider  acetaminophen (TYLENOL) 500 MG tablet Take 1,000 mg by mouth in the morning, at noon, and at bedtime.   Yes [provider]  aspirin 81 MG EC tablet Take 1 tablet (81 mg total) by mouth daily. 07/27/20  Yes  Claiborne Rigg, NP  meloxicam (MOBIC) 15 MG tablet Take 1 tablet (15 mg total) by mouth daily. Patient not taking: Reported on 06/18/2023 05/23/23   Juanda Chance, NP  methocarbamol (ROBAXIN) 500 MG tablet Take 1 tablet (500 mg total) by mouth 3 (three) times daily. Patient not taking: Reported on 06/18/2023 05/23/23   Juanda Chance, NP  amLODipine (NORVASC) 10 MG tablet Take 1 tablet (10 mg total) by mouth daily. Patient not taking: Reported on 06/18/2023 03/22/23   Gwyneth Sprout, MD  diclofenac (VOLTAREN) 75 MG EC tablet Take 1 tablet (75 mg total) by mouth 2 (two) times daily. Patient not taking: Reported on 06/18/2023 05/07/23   Storm Frisk, MD  furosemide (LASIX) 20 MG tablet Take 1 tablet (20 mg total) by  mouth daily. 06/08/23   Claiborne Rigg, NP  gabapentin (NEURONTIN) 300 MG capsule Take 1 capsule (300 mg total) by mouth 3 (three) times daily. Patient not taking: Reported on 06/18/2023 05/07/23   Storm Frisk, MD  gemfibrozil (LOPID) 600 MG tablet Take 1 tablet (600 mg total) by mouth 2 (two) times daily before a meal. Patient not taking: Reported on 06/18/2023 03/22/23   Gwyneth Sprout, MD  losartan (COZAAR) 50 MG tablet Take 1 tablet (50 mg total) by mouth daily. Patient not taking: Reported on 06/18/2023 10/15/22   Hoy Register, MD  predniSONE (DELTASONE) 50 MG tablet Take 1 tablet (50 mg total) by mouth daily with breakfast. Take until completed. Patient not taking: Reported on 06/18/2023 05/15/23   Juanda Chance, NP  rosuvastatin (CRESTOR) 20 MG tablet Take 1 tablet (20 mg total) by mouth daily. Patient not taking: Reported on 06/18/2023 03/22/23   Gwyneth Sprout, MD  tiZANidine (ZANAFLEX) 4 MG tablet Take 1 tablet (4 mg total) by mouth every 6 (six) hours as needed for muscle spasms. Patient not taking: Reported on 06/18/2023 05/07/23   Storm Frisk, MD    Physical Exam: Vitals:   06/18/23 0700 06/18/23 0730 06/18/23 0732 06/18/23 0800   BP: 115/70 119/69  (!) 125/57  Pulse: (!) 105 (!) 109  (!) 120  Resp:  (!) 23  (!) 26  Temp:   99.5 F (37.5 C)   TempSrc:      SpO2: 100% 99%  99%  Weight:      Height:       Wt Readings from Last 3 Encounters:  06/17/23 90.7 kg  05/07/23 90.5 kg  04/27/23 93 kg   Body mass index is 32.28 kg/m.  General: Obese built, not in obvious distress on supplemental oxygen HENT: Normocephalic, No scleral pallor or icterus noted. Oral mucosa is moist.  Chest: Diminished breath sounds bilaterally, coarse breath sounds noted CVS: S1 &S2 heard. No murmur.  Regular rate and rhythm. Abdomen: Soft, nontender, nondistended.  Bowel sounds are heard. No abdominal mass palpated Extremities: No cyanosis, clubbing with bilateral pitting edema +3 ,peripheral pulses are palpable. Psych: Alert, awake and oriented, normal mood CNS:  No cranial nerve deficits.  Positive straight leg raise test bilaterally. Skin: Warm and dry.  No rashes noted.  Labs on Admission:   CBC: Recent Labs  Lab 06/17/23 2240  WBC 9.6  HGB 8.3*  HCT 26.9*  MCV 96.1  PLT 142*    Basic Metabolic Panel: Recent Labs  Lab 06/17/23 2240  NA 127*  K 3.7  CL 97*  CO2 22  GLUCOSE 134*  BUN 7  CREATININE 0.63  CALCIUM 8.5*    Liver Function Tests: No results for input(s): "AST", "ALT", "ALKPHOS", "BILITOT", "PROT", "ALBUMIN" in the last 168 hours. No results for input(s): "LIPASE", "AMYLASE" in the last 168 hours. No results for input(s): "AMMONIA" in the last 168 hours.  Cardiac Enzymes: No results for input(s): "CKTOTAL", "CKMB", "CKMBINDEX", "TROPONINI" in the last 168 hours.  BNP (last 3 results) Recent Labs    03/22/23 1659 06/17/23 2240  BNP 160.9* 592.9*    ProBNP (last 3 results) No results for input(s): "PROBNP" in the last 8760 hours.  CBG: No results for input(s): "GLUCAP" in the last 168 hours.  Lipase     Component Value Date/Time   LIPASE 32 02/02/2021 2116     Urinalysis     Component Value Date/Time   COLORURINE YELLOW 04/27/2023 1204  APPEARANCEUR CLEAR 04/27/2023 1204   LABSPEC 1.017 04/27/2023 1204   PHURINE 5.0 04/27/2023 1204   GLUCOSEU NEGATIVE 04/27/2023 1204   HGBUR NEGATIVE 04/27/2023 1204   BILIRUBINUR NEGATIVE 04/27/2023 1204   KETONESUR NEGATIVE 04/27/2023 1204   PROTEINUR NEGATIVE 04/27/2023 1204   UROBILINOGEN 1.0 10/15/2013 1349   NITRITE NEGATIVE 04/27/2023 1204   LEUKOCYTESUR NEGATIVE 04/27/2023 1204     Drugs of Abuse  No results found for: "LABOPIA", "COCAINSCRNUR", "LABBENZ", "AMPHETMU", "THCU", "LABBARB"    Radiological Exams on Admission: DG Chest 2 View  Result Date: 06/17/2023 CLINICAL DATA:  Chest pain, shortness of breath EXAM: CHEST - 2 VIEW COMPARISON:  03/22/2023 FINDINGS: The heart size and mediastinal contours are within normal limits. Both lungs are clear. The visualized skeletal structures are unremarkable. IMPRESSION: Normal study. Electronically Signed   By: Charlett Nose M.D.   On: 06/17/2023 23:24    EKG: Personally reviewed by me which shows normal sinus rhythm.   Consultant: None  Code Status: Full code  Microbiology none  Antibiotics: None  Family Communication:  Patients' condition and plan of care including tests being ordered have been discussed with the patient  who indicate understanding and agree with the plan.   Status is: Inpatient    Severity of Illness: The appropriate patient status for this patient is INPATIENT. Inpatient status is judged to be reasonable and necessary in order to provide the required intensity of service to ensure the patient's safety. The patient's presenting symptoms, physical exam findings, and initial radiographic and laboratory data in the context of their chronic comorbidities is felt to place them at high risk for further clinical deterioration. Furthermore, it is not anticipated that the patient will be medically stable for discharge from the hospital within 2  midnights of admission.  I certify that at the point of admission it is my clinical judgment that the patient will require inpatient hospital care spanning beyond 2 midnights from the point of admission due to high intensity of service, high risk for further deterioration and high frequency of surveillance required.*  Signed, Joycelyn Das, MD [facility] 06/18/2023

## 2023-06-18 NOTE — Progress Notes (Signed)
OT Cancellation Note  Patient Details Name: Victor [name] MRN: [id number] DOB: January 13, 1971   Cancelled Treatment:    Reason Eval/Treat Not Completed: Other (comment).  Patient in process of transitioning to 3E01  Suzanna Obey 06/18/2023, 1:37 PM 06/18/2023  RP, OTR/L  Acute Rehabilitation Services  Office:  [phone]

## 2023-06-18 NOTE — ED Notes (Signed)
Provider at bedside

## 2023-06-18 NOTE — ED Notes (Signed)
ED TO INPATIENT HANDOFF REPORT  ED Nurse Name and Phone #: Beatris Ship RN [phone]  S Name/Age/Gender Victor [name] 52 y.o. male Room/Bed: 045C/045C  Code Status   Code Status: Full Code  Home/SNF/Other Home Patient oriented to: self, place, time, and situation Is this baseline? Yes   Triage Complete: Triage complete  Chief Complaint Acute on chronic heart failure with preserved ejection fraction (HFpEF) (HCC) [I50.33] Shortness of breath [R06.02]  Triage Note BLE swelling, shortness of breath aggravated with movement and generalized chest pains x 1 week.    Allergies No Known Allergies  Level of Care/Admitting Diagnosis ED Disposition     ED Disposition  Admit   Condition  --   Comment  Hospital Area: [facility] [[id number]]  Level of Care: Telemetry Cardiac [[id number]]  May admit patient to [facility] or [facility] if equivalent level of care is available:: No  Covid Evaluation: Asymptomatic - no recent exposure (last 10 days) testing not required  Diagnosis: Shortness of breath [786.05.ICD-9-CM]  Admitting Physician: Joycelyn Das [[id number]]  Attending Physician: Joycelyn Das [[id number]]  Certification:: I certify this patient will need inpatient services for at least 2 midnights          B Medical/Surgery History Past Medical History:  Diagnosis Date   Dizziness 10/15/2013    "SWIMMY HEAD    "   Hypertension    Hypertriglyceridemia    Obesity    Past Surgical History:  Procedure Laterality Date   INGUINAL HERNIA REPAIR Right 02/03/2021   Procedure: HERNIA REPAIR INGUINAL ADULT;  Surgeon: Diamantina Monks, MD;  Location: [facility] OR;  Service: General;  Laterality: Right;   INSERTION OF MESH Right 02/03/2021   Procedure: INSERTION OF MESH;  Surgeon: Diamantina Monks, MD;  Location: [facility] OR;  Service: General;  Laterality: Right;   NO PAST SURGERIES       A IV Location/Drains/Wounds Patient Lines/Drains/Airways Status     Active Line/Drains/Airways      Name Placement date Placement time Site Days   Peripheral IV 06/18/23 20 G 1" Anterior;Proximal;Right Forearm 06/18/23  0446  Forearm  less than 1            Intake/Output Last 24 hours No intake or output data in the 24 hours ending 06/18/23 1304  Labs/Imaging Results for orders placed or performed during the hospital encounter of 06/17/23 (from the past 48 hour(s))  Basic metabolic panel     Status: Abnormal   Collection Time: 06/17/23 10:40 PM  Result Value Ref Range   Sodium 127 (L) 135 - 145 mmol/L   Potassium 3.7 3.5 - 5.1 mmol/L   Chloride 97 (L) 98 - 111 mmol/L   CO2 22 22 - 32 mmol/L   Glucose, Bld 134 (H) 70 - 99 mg/dL    Comment: Glucose reference range applies only to samples taken after fasting for at least 8 hours.   BUN 7 6 - 20 mg/dL   Creatinine, Ser 9.52 0.61 - 1.24 mg/dL   Calcium 8.5 (L) 8.9 - 10.3 mg/dL   GFR, Estimated >84 >13 mL/min    Comment: (NOTE) Calculated using the CKD-EPI Creatinine Equation (2021)    Anion gap 8 5 - 15    Comment: Performed at [facility], [address], [place], [place] [address]  CBC     Status: Abnormal   Collection Time: 06/17/23 10:40 PM  Result Value Ref Range   WBC 9.6 4.0 - 10.5 K/uL  RBC 2.80 (L) 4.22 - 5.81 MIL/uL   Hemoglobin 8.3 (L) 13.0 - 17.0 g/dL   HCT 62.9 (L) 52.8 - 41.3 %   MCV 96.1 80.0 - 100.0 fL   MCH 29.6 26.0 - 34.0 pg   MCHC 30.9 30.0 - 36.0 g/dL   RDW 24.4 01.0 - 27.2 %   Platelets 142 (L) 150 - 400 K/uL   nRBC 0.0 0.0 - 0.2 %    Comment: Performed at [facility], [address], [place], [place] [address]  Brain natriuretic peptide     Status: Abnormal   Collection Time: 06/17/23 10:40 PM  Result Value Ref Range   B Natriuretic Peptide 592.9 (H) 0.0 - 100.0 pg/mL    Comment: Performed at [facility], [address], [place], [place] [address]  Troponin I (High Sensitivity)     Status: Abnormal   Collection Time: 06/18/23  4:40 AM  Result Value Ref Range    Troponin I (High Sensitivity) 60 (H) <18 ng/L    Comment: (NOTE) Elevated high sensitivity troponin I (hsTnI) values and significant  changes across serial measurements may suggest ACS but many other  chronic and acute conditions are known to elevate hsTnI results.  Refer to the "Links" section for chest pain algorithms and additional  guidance. Performed at [facility], [address], [place], [place] [address]   POC occult blood, ED     Status: None   Collection Time: 06/18/23  4:50 AM  Result Value Ref Range   Fecal Occult Bld NEGATIVE NEGATIVE  Troponin I (High Sensitivity)     Status: Abnormal   Collection Time: 06/18/23  8:25 AM  Result Value Ref Range   Troponin I (High Sensitivity) 53 (H) <18 ng/L    Comment: (NOTE) Elevated high sensitivity troponin I (hsTnI) values and significant  changes across serial measurements may suggest ACS but many other  chronic and acute conditions are known to elevate hsTnI results.  Refer to the "Links" section for chest pain algorithms and additional  guidance. Performed at [facility], [address] [place], [place] [address]   TSH     Status: None   Collection Time: 06/18/23  9:00 AM  Result Value Ref Range   TSH 1.613 0.350 - 4.500 uIU/mL    Comment: Performed by a 3rd Generation assay with a functional sensitivity of <=0.01 uIU/mL. Performed at [facility], [address] [place], [place] [address]   Hemoglobin A1c     Status: Abnormal   Collection Time: 06/18/23  9:00 AM  Result Value Ref Range   Hgb A1c MFr Bld 5.9 (H) 4.8 - 5.6 %    Comment: (NOTE) Pre diabetes:          5.7%-6.4%  Diabetes:              >6.4%  Glycemic control for   <7.0% adults with diabetes    Mean Plasma Glucose 122.63 mg/dL    Comment: Performed at [facility], [address] [place], [place] [address]  Reticulocytes     Status: Abnormal   Collection Time: 06/18/23  9:00 AM  Result Value Ref Range   Retic Ct Pct  2.3 0.4 - 3.1 %   RBC. 2.70 (L) 4.22 - 5.81 MIL/uL   Retic Count, Absolute 61.6 19.0 - 186.0 K/uL   Immature Retic Fract 26.9 (H) 2.3 - 15.9 %    Comment: Performed at [facility], [address] [place], [place] [address]  Vitamin B12     Status: None   Collection Time: 06/18/23  9:07 AM  Result Value Ref Range   Vitamin B-12 540 180 - 914 pg/mL    Comment: (NOTE) This assay is not validated for testing neonatal or myeloproliferative syndrome specimens for Vitamin B12 levels. Performed at [facility], [address] [place], [place] [address]   Folate     Status: None   Collection Time: 06/18/23  9:07 AM  Result Value Ref Range   Folate 9.8 >5.9 ng/mL    Comment: Performed at [facility], [address] [place], [place] [address]  Iron and TIBC     Status: Abnormal   Collection Time: 06/18/23 10:30 AM  Result Value Ref Range   Iron 18 (L) 45 - 182 ug/dL   TIBC 403 474 - 259 ug/dL   Saturation Ratios 7 (L) 17.9 - 39.5 %   UIBC 237 ug/dL    Comment: Performed at [facility], [address] [place], [place] [address]  Ferritin     Status: None   Collection Time: 06/18/23 10:30 AM  Result Value Ref Range   Ferritin 248 24 - 336 ng/mL    Comment: Performed at [facility], [address] [place], [place] [address]   DG Chest 2 View  Result Date: 06/17/2023 CLINICAL DATA:  Chest pain, shortness of breath EXAM: CHEST - 2 VIEW COMPARISON:  03/22/2023 FINDINGS: The heart size and mediastinal contours are within normal limits. Both lungs are clear. The visualized skeletal structures are unremarkable. IMPRESSION: Normal study. Electronically Signed   By: Charlett Nose M.D.   On: 06/17/2023 23:24    Pending Labs Unresulted Labs (From admission, onward)     Start     Ordered   06/19/23 0500  Basic metabolic panel  Tomorrow morning,   R        06/18/23 0900   06/19/23 0500  CBC  Tomorrow morning,   R        06/18/23 0900   06/19/23 0500   Magnesium  Tomorrow morning,   R        06/18/23 0900   06/18/23 0858  HIV Antibody (routine testing w rflx)  (HIV Antibody (Routine testing w reflex) panel)  Once,   R        06/18/23 0900            Vitals/Pain Today's Vitals   06/18/23 1000 06/18/23 1009 06/18/23 1203 06/18/23 1254  BP: 125/65   118/60  Pulse: (!) 106   (!) 110  Resp: (!) 33   (!) 35  Temp:   99.1 F (37.3 C)   TempSrc:   Oral   SpO2: 98%   95%  Weight:      Height:      PainSc:  6       Isolation Precautions No active isolations  Medications Medications  furosemide (LASIX) injection 40 mg (has no administration in time range)  rosuvastatin (CRESTOR) tablet 20 mg (20 mg Oral Given 06/18/23 1046)  methocarbamol (ROBAXIN) tablet 500 mg (500 mg Oral Given 06/18/23 1046)  gabapentin (NEURONTIN) capsule 300 mg (300 mg Oral Given 06/18/23 1047)  sodium chloride flush (NS) 0.9 % injection 3 mL (3 mLs Intravenous Given 06/18/23 1047)  sodium chloride flush (NS) 0.9 % injection 3 mL (has no administration in time range)  0.9 %  sodium chloride infusion (has no administration in time range)  oxyCODONE (Oxy IR/ROXICODONE) immediate release tablet 5 mg (5 mg Oral Given 06/18/23 0929)  docusate sodium (COLACE) capsule 100 mg (100 mg Oral Given 06/18/23 0929)  ondansetron (ZOFRAN) tablet 4 mg (has no administration in time range)    Or  ondansetron (ZOFRAN) injection 4 mg (has no administration in time range)  folic acid (FOLVITE) tablet 1 mg (1 mg Oral Given 06/18/23 1046)  multivitamin with minerals tablet 1 tablet (1 tablet Oral Given 06/18/23 0931)  thiamine (VITAMIN B1) tablet 100 mg (100 mg Oral Given 06/18/23 0933)  hydrALAZINE (APRESOLINE) injection 10 mg (has no administration in time range)  potassium chloride SA (KLOR-CON M) CR tablet 40 mEq (40 mEq Oral Given 06/18/23 0930)  furosemide (LASIX) injection 40 mg (40 mg Intravenous Given 06/18/23 0554)    Mobility walks     Focused  Assessments Cardiac Assessment Handoff:  Cardiac Rhythm: Sinus tachycardia Lab Results  Component Value Date   TROPONINI <0.30 10/16/2013   No results found for: "DDIMER" Does the Patient currently have chest pain? No   , Pulmonary Assessment Handoff:  Lung sounds: L Breath Sounds: Diminished        R Recommendations: See Admitting Provider Note  Report given to: 3E01

## 2023-06-18 NOTE — Consult Note (Addendum)
Cardiology Consultation   Patient ID: Travez Yaccarino MRN: [id number]; DOB: 1970-08-09  Admit date: 06/17/2023 Date of Consult: 06/18/2023  PCP:  Claiborne Rigg, NP   [facility] Providers Cardiologist:  Tonny Bollman, MD        Patient Profile:   Victor [name] is a 52 y.o. male with a hx of hypertension, diastolic heart failure, obesity, daily alcohol use, and hyperlipidemia who is being seen 06/18/2023 for the evaluation of CHF at the request of Dr. Tyson Babinski.  History of Present Illness:   Mr. Lunn has no prior cardiac history. He had a nuclear stress test 2015 that was nonischemic.  Echocardiogram in 2015 showed an LVEF 65%, no RWMA, no significant valvular disease, but question of SAM.  Follow-up TEE. He has a known RBBB and LAFB.   He was seen in the ER for back pain 04/27/2023 and was prescribed a short course of prednisone and NSAIDs.  He followed up with orthopedics who are planning a lumbar MRI and possible epidural injection.  He called community health and wellness 06/05/2023 with complaints of lower extremity swelling and was prescribed Lasix x 3 days.  He states that short course of Lasix did not improve his lower extremity swelling or shortness of breath.  He presented to [facility] ED 06/17/2023 with bilateral lower extremity swelling and shortness of breath.  He also reports chest pain x 1 week.  Lower extremity swelling and shortness of breath began approximately 2 weeks ago and has progressively worsened.  He has occasional chest pain when eating spicy or acidic food but no current chest pain in the ER. He also reported dark stool when taking prednisone but is unsure of stool color recently. He also reported subjective fever at home and cough.   HR 113-120 BP 124/71 RR up to 30, O2 99% on room air Hb 8.3 (11.1) Na 127 BNP 593 HS troponin 60 --> 53  CXR negative for edema/congestion  He has received 40 mg IV lasix x 1 dose.  Cardiology consulted for suspected  CHF. During my evaluation he describes long history of orthopnea - he sleeps in a recliner due to SOB when he tries to sleep flat. He reports being seen for back pain, as described above.  Lower extremity swelling started 2 to 3 weeks ago.  He also experienced shortness of breath with activity.  He denies shortness of breath at rest when sitting up.  He denies chest pain with activity.  He states he never gets chest pain outside of eating.  He can no longer eat processed or fried foods due to immediate chest pain.    He works as a Museum/gallery conservator work. He has had to turn down several jobs due to back pain and DOE/LE swelling.  He does not smoke cigarettes but does use chewing tobacco.  He drinks alcohol every other day, 16 ounce beer.  He has reduced alcohol intake.  He follows with community health and wellness for hypertension and hyperlipidemia.   Past Medical History:  Diagnosis Date   Dizziness 10/15/2013    "SWIMMY HEAD    "   Hypertension    Hypertriglyceridemia    Obesity     Past Surgical History:  Procedure Laterality Date   INGUINAL HERNIA REPAIR Right 02/03/2021   Procedure: HERNIA REPAIR INGUINAL ADULT;  Surgeon: Diamantina Monks, MD;  Location: [facility] OR;  Service: General;  Laterality: Right;   INSERTION OF MESH Right 02/03/2021   Procedure: INSERTION  OF MESH;  Surgeon: Diamantina Monks, MD;  Location: [facility] OR;  Service: General;  Laterality: Right;   NO PAST SURGERIES       Home Medications:  Prior to Admission medications   Medication Sig Start Date End Date Taking? Authorizing Provider  acetaminophen (TYLENOL) 500 MG tablet Take 1,000 mg by mouth in the morning, at noon, and at bedtime.   Yes [provider]  aspirin 81 MG EC tablet Take 1 tablet (81 mg total) by mouth daily. 07/27/20  Yes Claiborne Rigg, NP  meloxicam (MOBIC) 15 MG tablet Take 1 tablet (15 mg total) by mouth daily. Patient not taking: Reported on 06/18/2023 05/23/23   Juanda Chance, NP   methocarbamol (ROBAXIN) 500 MG tablet Take 1 tablet (500 mg total) by mouth 3 (three) times daily. Patient not taking: Reported on 06/18/2023 05/23/23   Juanda Chance, NP  amLODipine (NORVASC) 10 MG tablet Take 1 tablet (10 mg total) by mouth daily. Patient not taking: Reported on 06/18/2023 03/22/23   Gwyneth Sprout, MD  diclofenac (VOLTAREN) 75 MG EC tablet Take 1 tablet (75 mg total) by mouth 2 (two) times daily. Patient not taking: Reported on 06/18/2023 05/07/23   Storm Frisk, MD  furosemide (LASIX) 20 MG tablet Take 1 tablet (20 mg total) by mouth daily. 06/08/23   Claiborne Rigg, NP  gabapentin (NEURONTIN) 300 MG capsule Take 1 capsule (300 mg total) by mouth 3 (three) times daily. Patient not taking: Reported on 06/18/2023 05/07/23   Storm Frisk, MD  gemfibrozil (LOPID) 600 MG tablet Take 1 tablet (600 mg total) by mouth 2 (two) times daily before a meal. Patient not taking: Reported on 06/18/2023 03/22/23   Gwyneth Sprout, MD  losartan (COZAAR) 50 MG tablet Take 1 tablet (50 mg total) by mouth daily. Patient not taking: Reported on 06/18/2023 10/15/22   Hoy Register, MD  predniSONE (DELTASONE) 50 MG tablet Take 1 tablet (50 mg total) by mouth daily with breakfast. Take until completed. Patient not taking: Reported on 06/18/2023 05/15/23   Juanda Chance, NP  rosuvastatin (CRESTOR) 20 MG tablet Take 1 tablet (20 mg total) by mouth daily. Patient not taking: Reported on 06/18/2023 03/22/23   Gwyneth Sprout, MD  tiZANidine (ZANAFLEX) 4 MG tablet Take 1 tablet (4 mg total) by mouth every 6 (six) hours as needed for muscle spasms. Patient not taking: Reported on 06/18/2023 05/07/23   Storm Frisk, MD    Inpatient Medications: Scheduled Meds:  docusate sodium  100 mg Oral BID   folic acid  1 mg Oral Daily   furosemide  40 mg Intravenous Q12H   gabapentin  300 mg Oral TID   methocarbamol  500 mg Oral TID   multivitamin with minerals  1 tablet Oral  Daily   potassium chloride  40 mEq Oral Daily   rosuvastatin  20 mg Oral Daily   sodium chloride flush  3 mL Intravenous Q12H   thiamine  100 mg Oral Daily   Continuous Infusions:  sodium chloride     PRN Meds: sodium chloride, hydrALAZINE, ondansetron **OR** ondansetron (ZOFRAN) IV, oxyCODONE, sodium chloride flush  Allergies:   No Known Allergies  Social History:   Social History   Socioeconomic History   Marital status: Single    Spouse name: Not on file   Number of children: Not on file   Years of education: Not on file   Highest education level: Not on file  Occupational History  Not on file  Tobacco Use   Smoking status: Never   Smokeless tobacco: Current    Types: Chew  Vaping Use   Vaping status: Never Used  Substance and Sexual Activity   Alcohol use: Yes    Comment: 4 12 oz beers twice/wk.   Drug use: No   Sexual activity: Yes  Other Topics Concern   Not on file  Social History Narrative   Lives in [place].  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   Social Determinants of Health   Financial Resource Strain: Not on file  Food Insecurity: Not on file  Transportation Needs: Not on file  Physical Activity: Not on file  Stress: Not on file  Social Connections: Not on file  Intimate Partner Violence: Not on file    Family History:    Family History  Problem Relation Age of Onset   CAD Mother        stenting @ 7; alive @ 86   Aneurysm Mother        thoracic s/p surgery @ 9   Lung disease Mother    Other Father        unknown     ROS:  Please see the history of present illness.   All other ROS reviewed and negative.     Physical Exam/Data:   Vitals:   06/18/23 0732 06/18/23 0800 06/18/23 0930 06/18/23 1000  BP:  (!) 125/57 125/64 125/65  Pulse:  (!) 120 (!) 115 (!) 106  Resp:  (!) 26 16 (!) 33  Temp: 99.5 F (37.5 C)     TempSrc:      SpO2:  99% 97% 98%  Weight:      Height:       No intake or output data in the 24  hours ending 06/18/23 1114    06/17/2023   10:24 PM 05/07/2023    4:13 PM 04/27/2023   11:53 AM  Last 3 Weights  Weight (lbs) 200 lb 199 lb 9.6 oz 205 lb  Weight (kg) 90.719 kg 90.538 kg 92.987 kg     Body mass index is 32.28 kg/m.  General:  Well nourished, well developed, in no acute distress HEENT: normal Neck: + JVD, but exam difficult Vascular: No carotid bruits; Distal pulses 2+ bilaterally Cardiac:  regular rhythm, tachycardic rate, 2/6 systolic murmur Lungs:  clear to auscultation bilaterally, no wheezing, rhonchi or rales  Abd: soft, nontender, no hepatomegaly  Ext: B LE edema to knees Musculoskeletal:  No deformities, BUE and BLE strength normal and equal Skin: warm and dry  Neuro:  CNs 2-12 intact, no focal abnormalities noted Psych:  Normal affect   EKG:  The EKG was personally reviewed and demonstrates:  sinus tachycardia HR 108, RBBB, LAFB, LVH Telemetry:  Telemetry was personally reviewed and demonstrates:  sinus tachycardia with HR 100-120s  Relevant CV Studies:  Echo pending  Laboratory Data:  High Sensitivity Troponin:   Recent Labs  Lab 06/18/23 0440 06/18/23 0825  TROPONINIHS 60* 53*     Chemistry Recent Labs  Lab 06/17/23 2240  NA 127*  K 3.7  CL 97*  CO2 22  GLUCOSE 134*  BUN 7  CREATININE 0.63  CALCIUM 8.5*  GFRNONAA >60  ANIONGAP 8    No results for input(s): "PROT", "ALBUMIN", "AST", "ALT", "ALKPHOS", "BILITOT" in the last 168 hours. Lipids No results for input(s): "CHOL", "TRIG", "HDL", "LABVLDL", "LDLCALC", "CHOLHDL" in the last 168 hours.  Hematology Recent  Labs  Lab 06/17/23 2240 06/18/23 0900  WBC 9.6  --   RBC 2.80* 2.70*  HGB 8.3*  --   HCT 26.9*  --   MCV 96.1  --   MCH 29.6  --   MCHC 30.9  --   RDW 15.5  --   PLT 142*  --    Thyroid No results for input(s): "TSH", "FREET4" in the last 168 hours.  BNP Recent Labs  Lab 06/17/23 2240  BNP 592.9*    DDimer No results for input(s): "DDIMER" in the last 168  hours.   Radiology/Studies:  DG Chest 2 View  Result Date: 06/17/2023 CLINICAL DATA:  Chest pain, shortness of breath EXAM: CHEST - 2 VIEW COMPARISON:  03/22/2023 FINDINGS: The heart size and mediastinal contours are within normal limits. Both lungs are clear. The visualized skeletal structures are unremarkable. IMPRESSION: Normal study. Electronically Signed   By: Charlett Nose M.D.   On: 06/17/2023 23:24     Assessment and Plan:   DOE / LE edema Acute on chronic diastolic heart failure - echo 2952 with diastolic dysfunction - BNP elevated, CXR reassuring - has received IV lasix 40 mg x 1 dose - no I&Os charted - echo pending - he remains volume up and tachycardic with a systolic murmur - agree with diuresis - pt is sleepy, but maintaining O2 saturations on room air   Chest pain - CE mild and flat - EKG stable from prior - chest pain sounds atypical, but has not had a recent evaluation - if echo abnormal, may ultimately need a R/L HC - no current chest pain   Sinus tachycardia - unclear if this is related to his anemia vs CHF - echo pending    Cardiac murmur - 2015 echo mentioned possible systolic anterior motion of mitral valve, no TEE was obtained at that time - echo pending   Hypertension - PTA: 10 mg amlodipine, 50 mg losartan   Hyperlipidemia  - PTA:  20 mg crestor, 600 mg lopid BID   Anemia - Hb 8.3 (11.1) - fecal occult card negative   Alcohol use  - he states he has reduced his alcohol use to 16 oz every other day - he denies history of alcohol withdrawal - question an alcohol-related cardiomyopathy    Risk Assessment/Risk Scores:       New York Heart Association (NYHA) Functional Class NYHA Class IV        For questions or updates, please contact [facility] Please consult [email or web address] for contact info under    Signed, Marcelino Duster, PA  06/18/2023 11:14 AM  Patient seen, examined. Available data reviewed. Agree  with findings, assessment, and plan as outlined by Bettina Gavia, PA-C.  The patient is independently interviewed and examined.  He is alert, oriented, in no distress.  HEENT is normal, JVP is moderately elevated, carotid upstrokes are normal with transmitted murmur to both carotids, lungs are diminished in the bases but otherwise clear, heart is regular rate and rhythm with a 2/6 harsh mid peaking systolic murmur at the left lower sternal border, abdomen is soft and nontender, extremities have 2+ pretibial and ankle edema, skin is warm and dry with some chronic stasis changes in the lower extremities.  The patient presents with symptoms of acute heart failure.  BNP is elevated at 593.  Chest x-ray shows no evidence of interstitial edema.  He clearly has evidence of volume overload on exam as outlined above.  He is already  feeling better after IV diuresis last night.  Would continue IV furosemide 40 mg every 12 hours.  An echocardiogram is ordered and I have reviewed the findings.  He has complex cardiac findings with subvalvular obstruction and a high gradient with Valsalva.  He also has calcified aortic and mitral valve leaflets with at least mild to moderate aortic stenosis.  There may be chordal SAM of the mitral valve.  Transesophageal echo is recommended.  Will see how he is doing clinically tomorrow and whether he has improved enough to undergo transesophageal echo in the next 24 to 48 hours.  Would continue to diurese him in the meantime.  Will try to get him started on a low-dose of a beta-blocker.  He is counseled regarding alcohol cessation.  I do not think he needs assessment for coronary artery disease at this point as his LVEF is hyperdynamic with no regional wall motion abnormalities and his troponin is only minimally elevated in the setting of acute heart failure.  Our team will follow with you.  We may need to ultimately work him up for hypertrophic cardiomyopathy and we will make some  recommendations for this once his TEE is completed.  Tonny Bollman, M.D. 06/18/2023 4:53 PM

## 2023-06-18 NOTE — ED Notes (Signed)
CN was notified that pt was asleep in the waiting room. Pt placed in room.

## 2023-06-19 ENCOUNTER — Inpatient Hospital Stay (HOSPITAL_COMMUNITY): Payer: Medicaid Other

## 2023-06-19 DIAGNOSIS — I5031 Acute diastolic (congestive) heart failure: Secondary | ICD-10-CM | POA: Diagnosis not present

## 2023-06-19 DIAGNOSIS — R7881 Bacteremia: Secondary | ICD-10-CM | POA: Diagnosis not present

## 2023-06-19 DIAGNOSIS — M5416 Radiculopathy, lumbar region: Secondary | ICD-10-CM | POA: Diagnosis not present

## 2023-06-19 DIAGNOSIS — R0602 Shortness of breath: Secondary | ICD-10-CM | POA: Diagnosis not present

## 2023-06-19 DIAGNOSIS — B955 Unspecified streptococcus as the cause of diseases classified elsewhere: Secondary | ICD-10-CM | POA: Diagnosis not present

## 2023-06-19 DIAGNOSIS — R509 Fever, unspecified: Secondary | ICD-10-CM

## 2023-06-19 DIAGNOSIS — I059 Rheumatic mitral valve disease, unspecified: Secondary | ICD-10-CM | POA: Diagnosis not present

## 2023-06-19 DIAGNOSIS — R0989 Other specified symptoms and signs involving the circulatory and respiratory systems: Secondary | ICD-10-CM | POA: Diagnosis not present

## 2023-06-19 DIAGNOSIS — I10 Essential (primary) hypertension: Secondary | ICD-10-CM | POA: Diagnosis not present

## 2023-06-19 LAB — BLOOD CULTURE ID PANEL (REFLEXED) - BCID2

## 2023-06-19 LAB — RESPIRATORY PANEL BY PCR

## 2023-06-19 LAB — URINALYSIS, ROUTINE W REFLEX MICROSCOPIC
Bilirubin Urine: NEGATIVE
Glucose, UA: NEGATIVE mg/dL
Hgb urine dipstick: NEGATIVE
Ketones, ur: NEGATIVE mg/dL
Leukocytes,Ua: NEGATIVE
Nitrite: NEGATIVE
Protein, ur: NEGATIVE mg/dL
Specific Gravity, Urine: 1.011 (ref 1.005–1.030)
pH: 7 (ref 5.0–8.0)

## 2023-06-19 LAB — CBC
HCT: 24.9 % — ABNORMAL LOW (ref 39.0–52.0)
Hemoglobin: 8 g/dL — ABNORMAL LOW (ref 13.0–17.0)
MCH: 30.1 pg (ref 26.0–34.0)
MCHC: 32.1 g/dL (ref 30.0–36.0)
MCV: 93.6 fL (ref 80.0–100.0)
Platelets: 146 10*3/uL — ABNORMAL LOW (ref 150–400)
RBC: 2.66 MIL/uL — ABNORMAL LOW (ref 4.22–5.81)
RDW: 15.7 % — ABNORMAL HIGH (ref 11.5–15.5)
WBC: 8.5 10*3/uL (ref 4.0–10.5)
nRBC: 0 % (ref 0.0–0.2)

## 2023-06-19 LAB — SARS CORONAVIRUS 2 BY RT PCR: SARS Coronavirus 2 by RT PCR: NEGATIVE

## 2023-06-19 LAB — BASIC METABOLIC PANEL
Anion gap: 10 (ref 5–15)
BUN: 7 mg/dL (ref 6–20)
CO2: 22 mmol/L (ref 22–32)
Calcium: 8.4 mg/dL — ABNORMAL LOW (ref 8.9–10.3)
Chloride: 96 mmol/L — ABNORMAL LOW (ref 98–111)
Creatinine, Ser: 0.87 mg/dL (ref 0.61–1.24)
GFR, Estimated: >60 mL/min (ref >60)
Glucose, Bld: 133 mg/dL — ABNORMAL HIGH (ref 70–99)
Potassium: 4 mmol/L (ref 3.5–5.1)
Sodium: 128 mmol/L — ABNORMAL LOW (ref 135–145)

## 2023-06-19 LAB — MAGNESIUM: Magnesium: 1.7 mg/dL (ref 1.7–2.4)

## 2023-06-19 LAB — LACTIC ACID, PLASMA: Lactic Acid, Venous: 1.4 mmol/L (ref 0.5–1.9)

## 2023-06-19 MED ORDER — VANCOMYCIN HCL 1250 MG/250ML IV SOLN
1250.0000 mg | Freq: Two times a day (BID) | INTRAVENOUS | Status: DC
  Administered 2023-06-19 – 2023-06-21 (×4): 1250 mg via INTRAVENOUS
  Filled 2023-06-19 (×4): qty 250

## 2023-06-19 MED ORDER — VANCOMYCIN HCL 1750 MG/350ML IV SOLN
1750.0000 mg | Freq: Once | INTRAVENOUS | Status: AC
  Administered 2023-06-19: 1750 mg via INTRAVENOUS
  Filled 2023-06-19: qty 350

## 2023-06-19 MED ORDER — ACETAMINOPHEN 325 MG PO TABS
650.0000 mg | ORAL_TABLET | Freq: Four times a day (QID) | ORAL | Status: DC | PRN
  Administered 2023-06-19 – 2023-07-01 (×11): 650 mg via ORAL
  Filled 2023-06-19 (×10): qty 2

## 2023-06-19 MED ORDER — METOPROLOL TARTRATE 12.5 MG HALF TABLET
12.5000 mg | ORAL_TABLET | Freq: Two times a day (BID) | ORAL | Status: DC
  Administered 2023-06-19 – 2023-06-21 (×6): 12.5 mg via ORAL
  Filled 2023-06-19 (×6): qty 1

## 2023-06-19 MED ORDER — SODIUM CHLORIDE 0.9 % IV SOLN
2.0000 g | Freq: Three times a day (TID) | INTRAVENOUS | Status: DC
  Administered 2023-06-19 – 2023-06-20 (×4): 2 g via INTRAVENOUS
  Filled 2023-06-19 (×4): qty 12.5

## 2023-06-19 MED ORDER — PANTOPRAZOLE SODIUM 40 MG PO TBEC
40.0000 mg | DELAYED_RELEASE_TABLET | Freq: Every day | ORAL | Status: DC
  Administered 2023-06-19 – 2023-07-01 (×12): 40 mg via ORAL
  Filled 2023-06-19 (×12): qty 1

## 2023-06-19 MED ORDER — METRONIDAZOLE 500 MG/100ML IV SOLN
500.0000 mg | Freq: Two times a day (BID) | INTRAVENOUS | Status: DC
  Administered 2023-06-19 – 2023-06-20 (×4): 500 mg via INTRAVENOUS
  Filled 2023-06-19 (×4): qty 100

## 2023-06-19 MED ORDER — MAGNESIUM SULFATE 2 GM/50ML IV SOLN
2.0000 g | Freq: Once | INTRAVENOUS | Status: AC
  Administered 2023-06-19: 2 g via INTRAVENOUS
  Filled 2023-06-19: qty 50

## 2023-06-19 MED ORDER — SODIUM CHLORIDE 0.9 % IV SOLN
2.0000 g | Freq: Once | INTRAVENOUS | Status: AC
  Administered 2023-06-19: 2 g via INTRAVENOUS
  Filled 2023-06-19: qty 12.5

## 2023-06-19 NOTE — Plan of Care (Signed)
?  Problem: Clinical Measurements: ?Goal: Respiratory complications will improve ?Outcome: Progressing ?  ?Problem: Activity: ?Goal: Risk for activity intolerance will decrease ?Outcome: Progressing ?  ?Problem: Coping: ?Goal: Level of anxiety will decrease ?Outcome: Progressing ?  ?Problem: Elimination: ?Goal: Will not experience complications related to urinary retention ?Outcome: Progressing ?  ?

## 2023-06-19 NOTE — Progress Notes (Signed)
   06/19/23 1155  Assess: MEWS Score  Temp 97.6 F (36.4 C)  BP (!) 118/54  MAP (mmHg) 73  Pulse Rate 100  ECG Heart Rate (!) 111  Resp 20  SpO2 97 %  Assess: MEWS Score  MEWS Temp 0  MEWS Systolic 0  MEWS Pulse 2  MEWS RR 0  MEWS LOC 0  MEWS Score 2  MEWS Score Color Yellow  Assess: SIRS CRITERIA  SIRS Temperature  0  SIRS Pulse 1  SIRS Respirations  0  SIRS WBC 0  SIRS Score Sum  1   Patient sitting in chair eating lunch and is a sepsis workup. MD ordered PO metoprolol as IV Abx infusing. RN administered PO metoprolol.

## 2023-06-19 NOTE — Evaluation (Signed)
Physical Therapy Evaluation Patient Details Name: Victor [name] MRN: [id number] DOB: [birth year] Today's Date: 06/19/2023  History of Present Illness  52 years old male with past medical history of hyperlipidemia, obesity, hypertension, and chronic back pain presented to hospital 11/26 with exertional shortness of breath, bilateral lower extremity edema and pain for the last 2 weeks.  Patient also complains of shortness of breath at rest and has been having difficulty lying down in bed.  Clinical Impression  PTA pt living with a friend in single story home with 5 steps to enter. Pt was using cane for limited community ambulation and independent with ADLs and iADLs. Until the last 2 weeks when he was limited in safe mobility by DoE needing frequent rest breaks. Pt is currently limited in safe mobility by back pain, and 4/4 DoE with stair climbing, as well as numbness and tingling in B LE onset coincided with increase LE swelling in the last 2 weeks. Pt is contact guard for transfers, ambulation and climbing 1 step 5x. Pt will have no PT needs at discharge however PT will continue to follow acutely.       If plan is discharge home, recommend the following: A little help with walking and/or transfers;A little help with bathing/dressing/bathroom;Help with stairs or ramp for entrance;Assist for transportation   Can travel by private vehicle    Yes    Equipment Recommendations Rolling walker (2 wheels) (may refuse so ask first)     Functional Status Assessment Patient has had a recent decline in their functional status and demonstrates the ability to make significant improvements in function in a reasonable and predictable amount of time.     Precautions / Restrictions Precautions Precautions: Fall Restrictions Weight Bearing Restrictions: No      Mobility  Bed Mobility               General bed mobility comments: sitting up in recliner, does not tolerate laying in bed due to increased  SoB    Transfers Overall transfer level: Needs assistance Equipment used: Straight cane Transfers: Sit to/from Stand, Bed to chair/wheelchair/BSC Sit to Stand: Contact guard assist           General transfer comment: contact guard for safety, increased time and effort to come all the way to standing    Ambulation/Gait Ambulation/Gait assistance: Contact guard assist Gait Distance (Feet): 10 Feet Assistive device: Straight cane Gait Pattern/deviations: Step-through pattern, Trunk flexed, Shuffle Gait velocity: slowed Gait velocity interpretation: <1.31 ft/sec, indicative of household ambulator   General Gait Details: contact guard for safety with slowed shuffling gait, trunk flexed over cane due to back pain  Stairs Stairs: Yes Stairs assistance: Contact guard assist Stair Management: One rail Left, Step to pattern, Forwards, Backwards Number of Stairs:  (x5) General stair comments: simulated 5 steps into his home with use of bed rail as rail on L and cane in R hand, pt with 4/4  DoE requiring standing rest break to recover before walking 10 feet back to the recliner      Balance Overall balance assessment: Needs assistance Sitting-balance support: Feet supported Sitting balance-Leahy Scale: Good     Standing balance support: Single extremity supported Standing balance-Leahy Scale: Fair                               Pertinent Vitals/Pain Pain Assessment Pain Assessment: Faces Faces Pain Scale: Hurts little more Pain Location: low back.  Is  followed by NeuroSpine MD.  Was scheduled for spine injection. Pain Descriptors / Indicators: Shooting, Sharp Pain Intervention(s): Limited activity within patient's tolerance, Monitored during session, Repositioned    Home Living Family/patient expects to be discharged to:: Private residence Living Arrangements: Alone Available Help at Discharge: Friend(s);Available PRN/intermittently Type of Home: House Home  Access: Stairs to enter Entrance Stairs-Rails: Doctor, general practice of Steps: 4   Home Layout: One level Home Equipment: Cane - single point      Prior Function Prior Level of Function : Independent/Modified Independent;Working/employed;Driving             Mobility Comments: Uses a SPC for mobility due to chronic back pain ADLs Comments: Mod I for ADL, iADL.  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     Extremity/Trunk Assessment   Upper Extremity Assessment Upper Extremity Assessment: Defer to OT evaluation    Lower Extremity Assessment Lower Extremity Assessment: RLE deficits/detail;LLE deficits/detail RLE Deficits / Details: pt with numbness and tingling from calf down, onset corresponded to increased LE swelling, generalized weakness LLE Deficits / Details: pt with numbness and tingling from calf down, onset corresponded to increased LE swelling, generalized weakness    Cervical / Trunk Assessment Cervical / Trunk Assessment: Normal  Communication   Communication Communication: No apparent difficulties  Cognition Arousal: Alert Behavior During Therapy: WFL for tasks assessed/performed Overall Cognitive Status: Within Functional Limits for tasks assessed                                          General Comments General comments (skin integrity, edema, etc.): Pt with 4/4 DoE with stepping up step, SpO2 97% O2 on RA        Assessment/Plan    PT Assessment Patient needs continued PT services  PT Problem List Decreased strength;Decreased activity tolerance;Decreased mobility;Cardiopulmonary status limiting activity;Impaired sensation;Pain       PT Treatment Interventions DME instruction;Gait training;Stair training;Functional mobility training;Therapeutic activities;Therapeutic exercise;Balance training;Cognitive remediation;Patient/family education    PT Goals  (Current goals can be found in the Care Plan section)  Acute Rehab PT Goals PT Goal Formulation: With patient Time For Goal Achievement: 07/02/23 Potential to Achieve Goals: Fair    Frequency Min 1X/week        AM-PAC PT "6 Clicks" Mobility  Outcome Measure Help needed turning from your back to your side while in a flat bed without using bedrails?: A Lot Help needed moving from lying on your back to sitting on the side of a flat bed without using bedrails?: A Lot Help needed moving to and from a bed to a chair (including a wheelchair)?: A Little Help needed standing up from a chair using your arms (e.g., wheelchair or bedside chair)?: A Little Help needed to walk in hospital room?: A Little Help needed climbing 3-5 steps with a railing? : A Little 6 Click Score: 16    End of Session Equipment Utilized During Treatment: Gait belt Activity Tolerance: Patient limited by fatigue Patient left: in chair;with call bell/phone within reach Nurse Communication: Mobility status PT Visit Diagnosis: Unsteadiness on feet (R26.81);Difficulty in walking, not elsewhere classified (R26.2);Muscle weakness (generalized) (M62.81)    Time: 4270-6237 PT Time Calculation (min) (ACUTE ONLY): 19 min   Charges:   PT Evaluation $PT Eval Low Complexity: 1 Low   PT General  Charges $$ ACUTE PT VISIT: 1 Visit         Geena Weinhold B. Beverely Risen PT, DPT Acute Rehabilitation Services Please use secure chat or  Call Office [phone]   [facility] 06/19/2023, 11:31 AM

## 2023-06-19 NOTE — Progress Notes (Signed)
Pharmacy Antibiotic Note  Victor [name] is a 52 y.o. male admitted on 06/17/2023 c/o SOB thought to be c/w acute on chronic HF, now w/ fever and tachycardia >> concern for sepsis.  Pharmacy has been consulted for vancomycin and cefepime dosing.  Plan: Vancomycin 1750mg  IV x1 then 1250mg  IV Q12H. Goal AUC 400-550.  Expected AUC 470.  SCr used 0.8 (actual 0.63). Cefepime 2g IV Q8H. Also on metronidazole per MD.  Height: 5\' 7"  (170.2 cm) Weight: 86.2 kg (190 lb 0.6 oz) IBW/kg (Calculated) : 66.1  Temp (24hrs), Avg:99.5 F (37.5 C), Min:98.3 F (36.8 C), Max:102.8 F (39.3 C)  Recent Labs  Lab 06/17/23 2240 06/19/23 0239  WBC 9.6 8.5  CREATININE 0.63  --     Estimated Creatinine Clearance: 110.6 mL/min (by C-G formula based on SCr of 0.63 mg/dL).    No Known Allergies  Thank you for allowing pharmacy to be a part of this patient's care.  Vernard Gambles, PharmD, BCPS  06/19/2023 3:24 AM

## 2023-06-19 NOTE — Progress Notes (Addendum)
Patient Name: Victor [name] Date of Encounter: 06/19/2023 [facility] Cardiologist: Tonny Bollman, MD   Interval Summary  .    Sitting up in the chair eating lunch. Says the breathing is better. Having lower back pain  Vital Signs .    Vitals:   06/19/23 0153 06/19/23 0531 06/19/23 0802 06/19/23 1155  BP:  107/63 (!) 123/58 (!) 118/54  Pulse: (!) 104 96 96 100  Resp:  20 20 20   Temp: 99.7 F (37.6 C) 97.6 F (36.4 C) 97.6 F (36.4 C) 97.6 F (36.4 C)  TempSrc: Oral Oral Oral Oral  SpO2:  97% 97% 97%  Weight:  85.4 kg    Height:        Intake/Output Summary (Last 24 hours) at 06/19/2023 1211 Last data filed at 06/19/2023 1153 Gross per 24 hour  Intake 323 ml  Output 1825 ml  Net -1502 ml      06/19/2023    5:31 AM 06/18/2023    2:15 PM 06/17/2023   10:24 PM  Last 3 Weights  Weight (lbs) 188 lb 4.8 oz 190 lb 0.6 oz 200 lb  Weight (kg) 85.412 kg 86.2 kg 90.719 kg      Telemetry/ECG    Sinus Rhythm--Sinus Tachycardia - Personally Reviewed  Physical Exam .   GEN: No acute distress.   Neck: No JVD Cardiac: RRR, + 2/6 harsh systolic murmur LLSB, no rubs, or gallops.  Respiratory: Clear to auscultation bilaterally. GI: Soft, nontender, non-distended  MS: No edema  Assessment & Plan .     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 Currently treated with IV lasix, net - 1.5L. Weight down 190-188lbs. Continue IV lasix through today, then would probably stop. Need to be cautious with his volume status -- echo showed LVEF of 70-75%, concern for subvalvular obstruction with peak gradient as high as with valsalva. Also noted to have moderate mitral stenosis with possible SAM and moderate aortic stenosis -- he will need to undergo TEE for more definitive evaluation, will attempt place on schedule for Friday -- given concern for HCM, will attempt to add low dose  BB therapy, metoprolol 12.5mg  BID  Chest pain -- hsTn 60>>53, echo shows LVEF of 70-75% -- suspect demand in the setting of heart failure  Febrile Possible Sepsis? -- developed fever of 102 overnight -- started on vancomycin, cefepime, and metronidazole  -- respiratory panel neg, WBC and lactic WNL -- BC NTG -- he reports having ongoing lower back pain, and had an MRI 11/25 that has not resulted yet -- per primary  HLD -- on statin  Anemia -- Hgb 8.3>>8.0 -- fecal occult card negative -- per primary   Alcohol use  -- reports 16 oz every other day -- denies history of alcohol withdrawal   For questions or updates, please contact [facility] Please consult [email or web address] for contact info under        Signed, Laverda Page, NP   Patient seen, examined. Available data reviewed. Agree with findings, assessment, and plan as outlined by Laverda Page, NP.  The patient is independently interviewed and examined.  He is alert, oriented, in no distress.  HEENT is normal, JVP is mildly elevated, lungs are clear bilaterally, heart is regular rate and rhythm with a 3/6 systolic murmur best heard at the left lower sternal border, abdomen is soft and nontender, extremities have 1+ edema significantly improved  from yesterday in the pretibial regions.  The patient's echo is reviewed and he has both LV outflow obstruction with subvalvular gradient as well as evidence of moderate mitral and aortic stenosis.  He is also having fevers.  I agree that TEE is indicated to further evaluate his valvular and myocardial disease and also rule out infective endocarditis as a source of his fever. Informed Consent   Shared Decision Making/Informed Consent   The risks [esophageal damage, perforation (1:10,000 risk), bleeding, pharyngeal hematoma as well as other potential complications associated with conscious sedation including aspiration, arrhythmia, respiratory failure and death], benefits  (treatment guidance and diagnostic support) and alternatives of a transesophageal echocardiogram were discussed in detail with Victor [name] and he is willing to proceed.       Tonny Bollman, M.D. 06/19/2023 1:49 PM

## 2023-06-19 NOTE — Progress Notes (Signed)
Notified by RN that patient is febrile with temperature 102.8 F.  Heart rate currently 113 (sinus tachycardia) and has been persistently tachycardic since admission.  Blood pressure currently 117/67.  Satting 95% on room air.    Chest x-ray done yesterday on admission was not suggestive of pneumonia.  Patient had an outpatient MRI of lumbar spine done on 06/17/2023 for evaluation of low back pain and results currently pending. ?Osteomyelitis versus epidural abscess given fever.  Patient currently has no complaints.  Not complaining of back pain at this time.  No neck pain/stiffness or confusion to suggest meningitis.  Blood cultures ordered and will start broad-spectrum antibiotics including vancomycin, cefepime, and metronidazole for now given fever and tachycardia/concern for possible sepsis.  Check WBC count and lactate.  UA ordered to rule out UTI.  Will also test for COVID and flu/RVP ordered.  Tylenol as needed for fever.

## 2023-06-19 NOTE — Progress Notes (Signed)
PROGRESS NOTE    Victor [name]  ZOX:[id number] DOB: 10/06/1970 DOA: 06/17/2023 PCP: Claiborne Rigg, NP   Brief Narrative:  HPI per Dr. Ramon Dredge   Patient is a 52 years old male with past medical history of hyperlipidemia, obesity, hypertension presented to hospital with exertional shortness of breath, bilateral lower extremity edema and pain for the last 2 weeks.  Patient also complains of shortness of breath at rest and has been having difficulty lying down in bed.  Complains of mild cough with clear sputum production and has subjective fever at home.  Patient stated that he was taking prednisone and analgesics for his back pain and had been to a spine specialist as outpatient and had an MRI done yesterday.  Complains of some shooting pain on the lower legs.  Patient stated that he has been noticing increasing swelling of the legs over the last 2 weeks or so.  Patient denies any obvious blood in the stool but had some dark stools when he was taking prednisone.  His bowels have cleared up now.  He complains of gaseous feeling heartburn as well.  Patient admits to drinking alcohol 16 ounces not every day and denies any withdrawal symptoms from it.  Orthopnea or PND.  Was taking prednisone and analgesics as outpatient and noted some dark stools.  Denies any urinary urgency, frequency or dysuria.  Has not seen further black stools.  He complains of mild chest discomfort especially after eating food with some burning sensation.  Patient has been having difficulty ambulation due to dyspnea.  Denies any dizziness, lightheadedness or syncope.   In the ED, vitals were notable for tachycardia with tachypnea and temperature was borderline at 99.5 F.  Initial labs showed hyponatremia with sodium of 127 hemoglobin low at 8.3 and platelets low at 142.  BNP was elevated at 592.  Initial troponin I was 60.  EKG showed sinus tachycardia with right bundle branch block.  Chest x-ray did not show any infiltrate.  In  the ED patient received 40 mg of IV Lasix and was considered for admission to the hospital for new onset shortness of breath leg swelling.  **Interim History He was consulted and patient is being diuresed today but cardiology is concerned given that he has his mitral stenosis which is moderate with possible systolic anterior motion.  Cardiology recommending undergoing a TEE for more definitive evaluation.  Overnight he had a temperature of unclear etiology but does not appear septic appearing. Have asked Radiology to Read the outpatient MRI done a few days ago.   Assessment and Plan:  Shortness of Breath, Dyspnea on Exertion with lower extremity swelling in the setting of Acute on Chronic Diastolic CHF Moderate AS and Moderate MS with possible Systolic Anterior Motion -Patient seems to be thinking that this is something new but did have a history of similar complaints in 2015 and had done 2D echocardiogram 2015 with LV function of 65%.  -BNP elevated at 592.9    -Possibility of anemia causing dyspnea as well.  -Chest x-ray was negative.   Received 40 mg of IV of Lasix in the ED and now on IV Lasix 40 mg BID and cardiology recommending continuing through today -Strict I's and O's and daily weights -Echocardiogram done and showed an LVEF of 70 to 75% but did show concern for subvalvular obstruction with peak gradient as high as 117 mmHg with Valsalva.  Patient also noted to have moderate mitral stenosis with possible SAM and moderate aortic stenosis -  Cardiology recommends continuing diuresis today but recommending that he undergo TEE for more definitive evaluation and we will attempt to placee the patient on the schedule for Friday -Given his concern for HCM they are going to add low-dose beta-blocker therapy with metoprolol tartrate 12.5 mg p.o. twice daily -TSH was  -Appreciate Cardiology Evaluation and Recommendations  Chest Discomfort -Associated with Spicy Food -Mild troponin elevation and  flat.   -Likely nonspecific or secondary to heart failure. -EKG without any ischemic changes.   -Added PPI with Pantoprazole 40 mg po daily -If necessary will place on Maalox. -Troponin I went from 60 -> 53 -ECHO showed EF of 70-75% -Allergy suspects that he has chest discomfort in the setting of his heart failure   History of Hypertension -Continue to monitor closely.   -On Amlodipine and Losartan at home.   -C/w IV Hydralazine 10 mg q4hprn HBP  -Continue to Monitor BP per Protocol -Last BP reading was 135/75   Fever of Unclear Etiology -Spiked a Temp of 102.8 overnight -Afebrile now but WBC is not elevated -WBC Trend: Recent Labs  Lab 06/17/23 2240 06/19/23 0239  WBC 9.6 8.5  -Urinalysis was unremarkable -Respiratory virus panel via PCR is negative as well as SARS-CoV-2 and DG chest x-ray done and showed "Slightly more prominent interstitial markings at both lung bases could reflect mild interstitial edema or atypical infection." -Empirically placed on IV antibiotics with cefepime and IV Vancomycin and IV Metronidazole given unclear etiology of Fever -De-escalate Abx as needed pending further culture results -Radiology recommends a TEE to further evaluate his valvular and myocardial disease and also rule out infective endocarditis as a source of vascular  Mild likely Chronic Thrombocytopenia. -Patient drinks alcohol.   -No evidence of overt bleeding.    -Platelet Count Trend: Recent Labs  Lab 06/17/23 2240 06/19/23 0239  PLT 142* 146*  -Continue to monitor for signs and symptoms of bleeding -No overt bleeding noted and repeat CBC in the a.m.  HLD -C/w Rosuvastatin 20 mg po Daily    Hyponatremia -Uncertain etiology at this time but likely in the setting of volume overload -Renal function within normal range.   -Check TSH.   -Na+ Trend: Recent Labs  Lab 06/17/23 2240 06/19/23 0241  NA 127* 128*  -Could be related to drinking beer.   -Repeat CMP in the AM    Alcohol Use Disorder -Drinks 16 ounce beer probably every day.  Denies any withdrawal symptoms.   -Placed patient on Thiamine 100 mg po Daily,  Folic Acid 1 ,g po daily and Multivitamin + Minerals 1 tab po Daily.   -Closely monitor for any withdrawal symptoms.   Low Back Pain, Lumbar Radiculopathy  -Had seen a spine specialist as outpatient and MRI of the lumbar spine was ordered on 06/16/2025.   -Completed course of prednisone.   -Currently taking Tylenol for pain.   -Will need to follow-up with orthopedics as outpatient.   -Will get PT OT evaluation while in the hospital..   -Added Oxycodone IR 5 mg po q4hprn -Continue Gabapentin 300 mg po TID and muscle relaxants with Methocarbamol 500 mg po TID -Patient had an outpatient MRI on 06/17/2023 which has not yet resulted  Normocytic Anemia -Hgb/Hct Trend: Recent Labs  Lab 06/17/23 2240 06/19/23 0239  HGB 8.3* 8.0*  HCT 26.9* 24.9*  MCV 96.1 93.6  -FOBT negative but reported Dark Stools; Continue PPI with Pantoprazole -May benefit from Iron Transfusion -Hold ASA for now as well as NSAIDs -Anemia panel was  done and showed an iron level of 18, UIBC of 237, TIBC 255, saturation ratios of 7%, ferritin of 248, folate level 9.8, vitamin B12 level 540 -Continue to Monitor for S/Sx of Bleeding; no overt bleeding noted -Repeat CBC in the AM  Overweight -Complicates overall prognosis and care -Estimated body mass index is 29.49 kg/m as calculated from the following:   Height as of this encounter: 5\' 7"  (1.702 m).   Weight as of this encounter: 85.4 kg.  -Weight Loss and Dietary Counseling given   DVT prophylaxis: SCDs Start: 06/18/23 0858    Code Status: Full Code Family Communication: No family currently at bedside  Disposition Plan:  Level of care: Telemetry Cardiac Status is: Inpatient Remains inpatient appropriate because: Needs further clinical improvement and clearance by cardiology   Consultants:  Cardiology    Procedures:  ECHOCARDIOGRAM IMPRESSIONS     1. Left ventricular ejection fraction, by estimation, is 70 to 75%. The  left ventricle has hyperdynamic function. The LV outflow tract is  narrowed. There is a mid-cavity gradient peak 48 mmHg. There appears to be  an LV outflow peak gradient as high as  117 mmHg with Valsalva. Possible mild systolic anterior motion of the  mitral valve. The left ventricle has no regional wall motion  abnormalities. There is mild concentric left ventricular hypertrophy. Left  ventricular diastolic parameters are consistent  with Grade II diastolic dysfunction (pseudonormalization).   2. Right ventricular systolic function is normal. The right ventricular  size is normal. Tricuspid regurgitation signal is inadequate for assessing  PA pressure.   3. Left atrial size was moderately dilated.   4. The mitral valve is abnormal. The valve is moderately calcified and  restricted with calcification of the chords. Possible mild SAM but cannot  be definitive. Trivial mitral valve regurgitation. Probably moderate  mitral stenosis. The mean mitral valve  gradient is 11.0 mmHg.   5. The aortic valve was not well visualized. Aortic valve regurgitation  is not visualized. Moderate aortic valve stenosis. Aortic valve mean  gradient measures 22.0 mmHg.   6. The inferior vena cava is normal in size with <50% respiratory  variability, suggesting right atrial pressure of 8 mmHg.   7. Cannot rule out PFO vs small ASD (possible color flow across septum).   8. This patient needs a TEE. There are a number of abnormalities that are  difficult to work out by TTE. He has a hyperdynamic left ventricle with a  mid-cavity gradient. The LVOT is also significantly narrowed, possibly  with SAM, with very high LVOT  gradient with valsalva. 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 However, the mitral valve and aortic valve themselves are  both calcified and thickened and  appear to have moderate stenosis.   FINDINGS   Left Ventricle: Left ventricular ejection fraction, by estimation, is 70  to 75%. The left ventricle has hyperdynamic function. The left ventricle  has no regional wall motion abnormalities. The left ventricular internal  cavity size was normal in size.  There is mild concentric left ventricular hypertrophy. Left ventricular  diastolic parameters are consistent with Grade II diastolic dysfunction  (pseudonormalization).   Right Ventricle: The right ventricular size is normal. No increase in  right ventricular wall thickness. Right ventricular systolic function is  normal. Tricuspid regurgitation signal is inadequate for assessing PA  pressure.   Left Atrium: Left atrial size was moderately dilated.   Right Atrium: Right atrial size was normal in size.   Pericardium:  Trivial pericardial effusion is present.   Mitral Valve: The mitral valve is abnormal. There is moderate  calcification of the mitral valve leaflet(s). Moderate mitral annular  calcification. Trivial mitral valve regurgitation. Moderate mitral valve  stenosis. MV peak gradient, 19.0 mmHg. The mean  mitral valve gradient is 11.0 mmHg.   Tricuspid Valve: The tricuspid valve is normal in structure. Tricuspid  valve regurgitation is not demonstrated.   Aortic Valve: The aortic valve was not well visualized. Aortic valve  regurgitation is not visualized. Moderate aortic stenosis is present.  Aortic valve mean gradient measures 22.0 mmHg. Aortic valve peak gradient  measures 37.2 mmHg.   Pulmonic Valve: The pulmonic valve was normal in structure. Pulmonic valve  regurgitation is not visualized.   Aorta: The aortic root is normal in size and structure.   Venous: The inferior vena cava is normal in size with less than 50%  respiratory variability, suggesting right atrial pressure of 8 mmHg.   IAS/Shunts: Cannot rule out PFO vs small ASD (possible color flow across   septum).     LEFT VENTRICLE  PLAX 2D  LVIDd:         4.30 cm   Diastology  LVIDs:         2.70 cm   LV e' medial:    7.46 cm/s  LV PW:         1.20 cm   LV E/e' medial:  28.2  LV IVS:        1.00 cm   LV e' lateral:   9.48 cm/s  LVOT diam:     2.00 cm   LV E/e' lateral: 22.2  LVOT Area:     3.14 cm     RIGHT VENTRICLE             IVC  RV Basal diam:  2.60 cm     IVC diam: 2.10 cm  RV S prime:     16.00 cm/s  TAPSE (M-mode): 2.5 cm   LEFT ATRIUM             Index        RIGHT ATRIUM           Index  LA diam:        4.50 cm 2.27 cm/m   RA Area:     14.70 cm  LA Vol (A2C):   86.4 ml 43.66 ml/m  RA Volume:   36.50 ml  18.45 ml/m  LA Vol (A4C):   96.0 ml 48.51 ml/m  LA Biplane Vol: 92.2 ml 46.59 ml/m   AORTIC VALVE  AV Vmax:      305.00 cm/s  AV Vmean:     221.000 cm/s  AV VTI:       0.471 m  AV Peak Grad: 37.2 mmHg  AV Mean Grad: 22.0 mmHg    AORTA  Ao Root diam: 2.70 cm  Ao Asc diam:  2.80 cm   MITRAL VALVE  MV Area (PHT): 3.46 cm     SHUNTS  MV Peak grad:  19.0 mmHg    Systemic Diam: 2.00 cm  MV Mean grad:  11.0 mmHg  MV Vmax:       2.18 m/s  MV Vmean:      156.0 cm/s  MV Decel Time: 219 msec  MV E velocity: 210.00 cm/s  MV A velocity: 162.00 cm/s  MV E/A ratio:  1.30   Antimicrobials:  Anti-infectives (From admission, onward)    Start  Dose/Rate Route Frequency Ordered Stop   06/19/23 1600  vancomycin (VANCOREADY) IVPB 1250 mg/250 mL        1,250 mg 166.7 mL/hr over 90 Minutes Intravenous Every 12 hours 06/19/23 0323     06/19/23 1200  ceFEPIme (MAXIPIME) 2 g in sodium chloride 0.9 % 100 mL IVPB        2 g 200 mL/hr over 30 Minutes Intravenous Every 8 hours 06/19/23 0323     06/19/23 0145  vancomycin (VANCOREADY) IVPB 1750 mg/350 mL        1,750 mg 175 mL/hr over 120 Minutes Intravenous  Once 06/19/23 0051 06/19/23 1044   06/19/23 0145  ceFEPIme (MAXIPIME) 2 g in sodium chloride 0.9 % 100 mL IVPB        2 g 200 mL/hr over 30 Minutes Intravenous   Once 06/19/23 0051 06/19/23 0908   06/19/23 0045  metroNIDAZOLE (FLAGYL) IVPB 500 mg        500 mg 100 mL/hr over 60 Minutes Intravenous Every 12 hours 06/19/23 0041         Subjective: Seen and examined at bedside and was doing little bit better and was not as short of breath.  Was febrile overnight.  No chest pain or lightheadedness or dizziness but feels weak.  No other concerns or complaints at this time.  Objective: Vitals:   06/19/23 0531 06/19/23 0802 06/19/23 1155 06/19/23 1244  BP: 107/63 (!) 123/58 (!) 118/54 135/75  Pulse: 96 96 100   Resp: 20 20 20 20   Temp: 97.6 F (36.4 C) 97.6 F (36.4 C) 97.6 F (36.4 C)   TempSrc: Oral Oral Oral   SpO2: 97% 97% 97%   Weight: 85.4 kg     Height:        Intake/Output Summary (Last 24 hours) at 06/19/2023 1611 Last data filed at 06/19/2023 1606 Gross per 24 hour  Intake 323 ml  Output 1650 ml  Net -1327 ml   Filed Weights   06/17/23 2224 06/18/23 1415 06/19/23 0531  Weight: 90.7 kg 86.2 kg 85.4 kg   Examination: Physical Exam:  Constitutional: WN/WD overweight African-American male in no acute distress Respiratory: Diminished to auscultation bilaterally with coarse breath sounds, no wheezing, rales, rhonchi or crackles. Normal respiratory effort and patient is not tachypenic. No accessory muscle use. Unlabored breathing Cardiovascular: RRR, no murmurs / rubs / gallops. S1 and S2 auscultated. No extremity edema. Abdomen: Soft, non-tender, Distended 2/2 body habitus. Bowel sounds positive.  GU: Deferred. Musculoskeletal: No clubbing / cyanosis of digits/nails. No joint deformity upper and lower extremities. Skin: No rashes, lesions, ulcers on a limited skin evaluation. No induration; Warm and dry.  Neurologic: CN 2-12 grossly intact with no focal deficits. Romberg sign and cerebellar reflexes not assessed.  Psychiatric: Normal judgment and insight. Alert and oriented x 3. Normal mood and appropriate affect.   Data  Reviewed: I have personally reviewed following labs and imaging studies  CBC: Recent Labs  Lab 06/17/23 2240 06/19/23 0239  WBC 9.6 8.5  HGB 8.3* 8.0*  HCT 26.9* 24.9*  MCV 96.1 93.6  PLT 142* 146*   Basic Metabolic Panel: Recent Labs  Lab 06/17/23 2240 06/19/23 0241  NA 127* 128*  K 3.7 4.0  CL 97* 96*  CO2 22 22  GLUCOSE 134* 133*  BUN 7 7  CREATININE 0.63 0.87  CALCIUM 8.5* 8.4*  MG  --  1.7   GFR: Estimated Creatinine Clearance: 101.3 mL/min (by C-G formula based on SCr of 0.87  mg/dL). Liver Function Tests: No results for input(s): "AST", "ALT", "ALKPHOS", "BILITOT", "PROT", "ALBUMIN" in the last 168 hours. No results for input(s): "LIPASE", "AMYLASE" in the last 168 hours. No results for input(s): "AMMONIA" in the last 168 hours. Coagulation Profile: No results for input(s): "INR", "PROTIME" in the last 168 hours. Cardiac Enzymes: No results for input(s): "CKTOTAL", "CKMB", "CKMBINDEX", "TROPONINI" in the last 168 hours. BNP (last 3 results) No results for input(s): "PROBNP" in the last 8760 hours. HbA1C: Recent Labs    06/18/23 0900  HGBA1C 5.9*   CBG: No results for input(s): "GLUCAP" in the last 168 hours. Lipid Profile: No results for input(s): "CHOL", "HDL", "LDLCALC", "TRIG", "CHOLHDL", "LDLDIRECT" in the last 72 hours. Thyroid Function Tests: Recent Labs    06/18/23 0900  TSH 1.613   Anemia Panel: Recent Labs    06/18/23 0900 06/18/23 0907 06/18/23 1030  VITAMINB12  --  540  --   FOLATE  --  9.8  --   FERRITIN  --   --  248  TIBC  --   --  255  IRON  --   --  18*  RETICCTPCT 2.3  --   --    Sepsis Labs: Recent Labs  Lab 06/19/23 0239  LATICACIDVEN 1.4   Recent Results (from the past 240 hour(s))  Culture, blood (Routine X 2) w Reflex to ID Panel     Status: None (Preliminary result)   Collection Time: 06/19/23  2:39 AM   Specimen: BLOOD  Result Value Ref Range Status   Specimen Description BLOOD BLOOD LEFT ARM  Final    Special Requests   Final    BOTTLES DRAWN AEROBIC AND ANAEROBIC Blood Culture adequate volume   Culture   Final    NO GROWTH < 12 HOURS Performed at [facility], [address] [place], [place] [address]    Report Status PENDING  Incomplete  Culture, blood (Routine X 2) w Reflex to ID Panel     Status: None (Preliminary result)   Collection Time: 06/19/23  2:41 AM   Specimen: BLOOD  Result Value Ref Range Status   Specimen Description BLOOD BLOOD RIGHT ARM  Final   Special Requests   Final    BOTTLES DRAWN AEROBIC AND ANAEROBIC Blood Culture adequate volume   Culture   Final    NO GROWTH < 12 HOURS Performed at [facility], [address], [place], [place] [address]    Report Status PENDING  Incomplete  SARS Coronavirus 2 by RT PCR (hospital order, performed in [facility] hospital lab) *cepheid single result test* Anterior Nasal Swab     Status: None   Collection Time: 06/19/23  2:45 AM   Specimen: Anterior Nasal Swab  Result Value Ref Range Status   SARS Coronavirus 2 by RT PCR NEGATIVE NEGATIVE Final    Comment: Performed at [facility], [address] [place], [place] [address]  Respiratory (~20 pathogens) panel by PCR     Status: None   Collection Time: 06/19/23  2:45 AM   Specimen: Nasopharyngeal Swab; Respiratory  Result Value Ref Range Status   Adenovirus NOT DETECTED NOT DETECTED Final   Coronavirus 229E NOT DETECTED NOT DETECTED Final    Comment: (NOTE) The Coronavirus on the Respiratory Panel, DOES NOT test for the novel  Coronavirus (2019 nCoV)    Coronavirus HKU1 NOT DETECTED NOT DETECTED Final   Coronavirus NL63 NOT DETECTED NOT DETECTED Final   Coronavirus OC43  NOT DETECTED NOT DETECTED Final   Metapneumovirus NOT DETECTED NOT DETECTED Final   Rhinovirus / Enterovirus NOT DETECTED NOT DETECTED Final   Influenza A NOT DETECTED NOT DETECTED Final   Influenza B NOT DETECTED NOT DETECTED Final   Parainfluenza Virus 1 NOT DETECTED NOT  DETECTED Final   Parainfluenza Virus 2 NOT DETECTED NOT DETECTED Final   Parainfluenza Virus 3 NOT DETECTED NOT DETECTED Final   Parainfluenza Virus 4 NOT DETECTED NOT DETECTED Final   Respiratory Syncytial Virus NOT DETECTED NOT DETECTED Final   Bordetella pertussis NOT DETECTED NOT DETECTED Final   Bordetella Parapertussis NOT DETECTED NOT DETECTED Final   Chlamydophila pneumoniae NOT DETECTED NOT DETECTED Final   Mycoplasma pneumoniae NOT DETECTED NOT DETECTED Final    Comment: Performed at [facility], [address] [place], [place] [address]    Radiology Studies: DG CHEST PORT 1 VIEW  Result Date: 06/19/2023 CLINICAL DATA:  Fever. EXAM: PORTABLE CHEST 1 VIEW COMPARISON:  Chest x-ray dated June 17, 2023. FINDINGS: The heart size and mediastinal contours are within normal limits. Lower lung volumes compared to prior. Slightly more prominent interstitial markings at both lung bases. No focal consolidation, pleural effusion, or pneumothorax. No acute osseous abnormality. IMPRESSION: 1. Slightly more prominent interstitial markings at both lung bases could reflect mild interstitial edema or atypical infection. Electronically Signed   By: Obie Dredge M.D.   On: 06/19/2023 12:34   ECHOCARDIOGRAM COMPLETE  Result Date: 06/18/2023    ECHOCARDIOGRAM REPORT   Patient Name:   HAMID HASER Date of Exam: 06/18/2023 Medical Rec #:  [id number]   Height:       67.0 in Accession #:    [id number]  Weight:       190.0 lb Date of Birth:  07-08-1971    BSA:          1.979 m Patient Age:    52 years    BP:           122/58 mmHg Patient Gender: M           HR:           109 bpm. Exam Location:  Inpatient Procedure: 2D Echo, Cardiac Doppler and Color Doppler Indications:    acute systolic chf  History:        Patient has prior history of Echocardiogram examinations, most                 recent 10/16/2013. Signs/Symptoms:Dyspnea; Risk                 Factors:Hypertension and Dyslipidemia.  Sonographer:     Delcie Roch RDCS Referring Phys: [phone] [facility] POKHREL IMPRESSIONS  1. Left ventricular ejection fraction, by estimation, is 70 to 75%. The left ventricle has hyperdynamic function. The LV outflow tract is narrowed. There is a mid-cavity gradient peak 48 mmHg. There appears to be an LV outflow peak gradient as high as 117 mmHg with Valsalva. Possible mild systolic anterior motion of the mitral valve. The left ventricle has no regional wall motion abnormalities. There is mild concentric left ventricular hypertrophy. Left ventricular diastolic parameters are consistent with Grade II diastolic dysfunction (pseudonormalization).  2. Right ventricular systolic function is normal. The right ventricular size is normal. Tricuspid regurgitation signal is inadequate for assessing PA pressure.  3. Left atrial size was moderately dilated.  4. The mitral valve is abnormal. The valve is moderately calcified and restricted with calcification of the chords. Possible mild SAM  but cannot be definitive. Trivial mitral valve regurgitation. Probably moderate mitral stenosis. The mean mitral valve gradient is 11.0 mmHg.  5. The aortic valve was not well visualized. Aortic valve regurgitation is not visualized. Moderate aortic valve stenosis. Aortic valve mean gradient measures 22.0 mmHg.  6. The inferior vena cava is normal in size with <50% respiratory variability, suggesting right atrial pressure of 8 mmHg.  7. Cannot rule out PFO vs small ASD (possible color flow across septum).  8. This patient needs a TEE. There are a number of abnormalities that are difficult to work out by TTE. He has a hyperdynamic left ventricle with a mid-cavity gradient. The LVOT is also significantly narrowed, possibly with SAM, with very high LVOT gradient with valsalva. 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 However, the mitral valve and aortic valve themselves are both calcified and thickened and appear to have moderate stenosis.  FINDINGS  Left Ventricle: Left ventricular ejection fraction, by estimation, is 70 to 75%. The left ventricle has hyperdynamic function. The left ventricle has no regional wall motion abnormalities. The left ventricular internal cavity size was normal in size. There is mild concentric left ventricular hypertrophy. Left ventricular diastolic parameters are consistent with Grade II diastolic dysfunction (pseudonormalization). Right Ventricle: The right ventricular size is normal. No increase in right ventricular wall thickness. Right ventricular systolic function is normal. Tricuspid regurgitation signal is inadequate for assessing PA pressure. Left Atrium: Left atrial size was moderately dilated. Right Atrium: Right atrial size was normal in size. Pericardium: Trivial pericardial effusion is present. Mitral Valve: The mitral valve is abnormal. There is moderate calcification of the mitral valve leaflet(s). Moderate mitral annular calcification. Trivial mitral valve regurgitation. Moderate mitral valve stenosis. MV peak gradient, 19.0 mmHg. The mean mitral valve gradient is 11.0 mmHg. Tricuspid Valve: The tricuspid valve is normal in structure. Tricuspid valve regurgitation is not demonstrated. Aortic Valve: The aortic valve was not well visualized. Aortic valve regurgitation is not visualized. Moderate aortic stenosis is present. Aortic valve mean gradient measures 22.0 mmHg. Aortic valve peak gradient measures 37.2 mmHg. Pulmonic Valve: The pulmonic valve was normal in structure. Pulmonic valve regurgitation is not visualized. Aorta: The aortic root is normal in size and structure. Venous: The inferior vena cava is normal in size with less than 50% respiratory variability, suggesting right atrial pressure of 8 mmHg. IAS/Shunts: Cannot rule out PFO vs small ASD (possible color flow across septum).  LEFT VENTRICLE PLAX 2D LVIDd:         4.30 cm   Diastology LVIDs:         2.70 cm   LV e' medial:    7.46 cm/s LV PW:          1.20 cm   LV E/e' medial:  28.2 LV IVS:        1.00 cm   LV e' lateral:   9.48 cm/s LVOT diam:     2.00 cm   LV E/e' lateral: 22.2 LVOT Area:     3.14 cm  RIGHT VENTRICLE             IVC RV Basal diam:  2.60 cm     IVC diam: 2.10 cm RV S prime:     16.00 cm/s TAPSE (M-mode): 2.5 cm LEFT ATRIUM             Index        RIGHT ATRIUM           Index LA diam:  4.50 cm 2.27 cm/m   RA Area:     14.70 cm LA Vol (A2C):   86.4 ml 43.66 ml/m  RA Volume:   36.50 ml  18.45 ml/m LA Vol (A4C):   96.0 ml 48.51 ml/m LA Biplane Vol: 92.2 ml 46.59 ml/m  AORTIC VALVE AV Vmax:      305.00 cm/s AV Vmean:     221.000 cm/s AV VTI:       0.471 m AV Peak Grad: 37.2 mmHg AV Mean Grad: 22.0 mmHg  AORTA Ao Root diam: 2.70 cm Ao Asc diam:  2.80 cm MITRAL VALVE MV Area (PHT): 3.46 cm     SHUNTS MV Peak grad:  19.0 mmHg    Systemic Diam: 2.00 cm MV Mean grad:  11.0 mmHg MV Vmax:       2.18 m/s MV Vmean:      156.0 cm/s MV Decel Time: 219 msec MV E velocity: 210.00 cm/s MV A velocity: 162.00 cm/s MV E/A ratio:  1.30 Dalton McleanMD Electronically signed by Wilfred Lacy Signature Date/Time: 06/18/2023/3:44:12 PM    Final    DG Chest 2 View  Result Date: 06/17/2023 CLINICAL DATA:  Chest pain, shortness of breath EXAM: CHEST - 2 VIEW COMPARISON:  03/22/2023 FINDINGS: The heart size and mediastinal contours are within normal limits. Both lungs are clear. The visualized skeletal structures are unremarkable. IMPRESSION: Normal study. Electronically Signed   By: Charlett Nose M.D.   On: 06/17/2023 23:24    Scheduled Meds:  docusate sodium  100 mg Oral BID   folic acid  1 mg Oral Daily   furosemide  40 mg Intravenous Q12H   gabapentin  300 mg Oral TID   methocarbamol  500 mg Oral TID   metoprolol tartrate  12.5 mg Oral BID   multivitamin with minerals  1 tablet Oral Daily   pantoprazole  40 mg Oral Daily   potassium chloride  40 mEq Oral Daily   rosuvastatin  20 mg Oral Daily   sodium chloride flush  3 mL Intravenous  Q12H   thiamine  100 mg Oral Daily   Continuous Infusions:  ceFEPime (MAXIPIME) IV 2 g (06/19/23 1153)   metronidazole 500 mg (06/19/23 1050)   vancomycin      LOS: 1 day   Marguerita Merles, DO [facility] Available via Epic secure chat 7am-7pm After these hours, please refer to coverage provider listed on [email or web address] 06/19/2023, 4:11 PM

## 2023-06-19 NOTE — Progress Notes (Signed)
Mobility Specialist Progress Note:   06/19/23 0915  Mobility  Activity Ambulated with assistance in hallway  Level of Assistance Contact guard assist, steadying assist  Assistive Device Cane  Distance Ambulated (ft) 200 ft  Activity Response Tolerated well  Mobility Referral Yes  $Mobility charge 1 Mobility  Mobility Specialist Start Time (ACUTE ONLY) 0915  Mobility Specialist Stop Time (ACUTE ONLY) 0930  Mobility Specialist Time Calculation (min) (ACUTE ONLY) 15 min   Pt agreeable to mobility session. Required only minG assist during ambulation with SPC. C/o back pain, and BLE weakness/pins & needles. Pt left sitting in chair with all needs met.   Addison Lank Mobility Specialist Please contact via SecureChat or  Rehab office at [phone]

## 2023-06-20 ENCOUNTER — Inpatient Hospital Stay (HOSPITAL_COMMUNITY): Payer: Medicaid Other

## 2023-06-20 DIAGNOSIS — R7881 Bacteremia: Secondary | ICD-10-CM

## 2023-06-20 DIAGNOSIS — M47816 Spondylosis without myelopathy or radiculopathy, lumbar region: Secondary | ICD-10-CM | POA: Diagnosis not present

## 2023-06-20 DIAGNOSIS — I5031 Acute diastolic (congestive) heart failure: Secondary | ICD-10-CM | POA: Diagnosis not present

## 2023-06-20 DIAGNOSIS — I110 Hypertensive heart disease with heart failure: Secondary | ICD-10-CM

## 2023-06-20 DIAGNOSIS — I509 Heart failure, unspecified: Secondary | ICD-10-CM

## 2023-06-20 DIAGNOSIS — I059 Rheumatic mitral valve disease, unspecified: Secondary | ICD-10-CM | POA: Diagnosis not present

## 2023-06-20 DIAGNOSIS — M5127 Other intervertebral disc displacement, lumbosacral region: Secondary | ICD-10-CM | POA: Diagnosis not present

## 2023-06-20 DIAGNOSIS — R0602 Shortness of breath: Secondary | ICD-10-CM | POA: Diagnosis not present

## 2023-06-20 DIAGNOSIS — I10 Essential (primary) hypertension: Secondary | ICD-10-CM | POA: Diagnosis not present

## 2023-06-20 DIAGNOSIS — M48061 Spinal stenosis, lumbar region without neurogenic claudication: Secondary | ICD-10-CM | POA: Diagnosis not present

## 2023-06-20 DIAGNOSIS — B955 Unspecified streptococcus as the cause of diseases classified elsewhere: Secondary | ICD-10-CM

## 2023-06-20 DIAGNOSIS — M5416 Radiculopathy, lumbar region: Secondary | ICD-10-CM | POA: Diagnosis not present

## 2023-06-20 DIAGNOSIS — R509 Fever, unspecified: Secondary | ICD-10-CM | POA: Diagnosis not present

## 2023-06-20 LAB — CBC WITH DIFFERENTIAL/PLATELET
Abs Immature Granulocytes: 0.05 10*3/uL (ref 0.00–0.07)
Basophils Absolute: 0 10*3/uL (ref 0.0–0.1)
Basophils Relative: 0 %
Eosinophils Absolute: 0.1 10*3/uL (ref 0.0–0.5)
Eosinophils Relative: 2 %
HCT: 24.4 % — ABNORMAL LOW (ref 39.0–52.0)
Hemoglobin: 7.6 g/dL — ABNORMAL LOW (ref 13.0–17.0)
Immature Granulocytes: 1 %
Lymphocytes Relative: 22 %
Lymphs Abs: 1.7 10*3/uL (ref 0.7–4.0)
MCH: 29.1 pg (ref 26.0–34.0)
MCHC: 31.1 g/dL (ref 30.0–36.0)
MCV: 93.5 fL (ref 80.0–100.0)
Monocytes Absolute: 0.7 10*3/uL (ref 0.1–1.0)
Monocytes Relative: 9 %
Neutro Abs: 5.2 10*3/uL (ref 1.7–7.7)
Neutrophils Relative %: 66 %
Platelets: 155 10*3/uL (ref 150–400)
RBC: 2.61 MIL/uL — ABNORMAL LOW (ref 4.22–5.81)
RDW: 15.5 % (ref 11.5–15.5)
WBC: 7.8 10*3/uL (ref 4.0–10.5)
nRBC: 0 % (ref 0.0–0.2)

## 2023-06-20 LAB — COMPREHENSIVE METABOLIC PANEL
ALT: 33 U/L (ref 0–44)
AST: 50 U/L — ABNORMAL HIGH (ref 15–41)
Albumin: 2.3 g/dL — ABNORMAL LOW (ref 3.5–5.0)
Alkaline Phosphatase: 48 U/L (ref 38–126)
Anion gap: 8 (ref 5–15)
BUN: 13 mg/dL (ref 6–20)
CO2: 22 mmol/L (ref 22–32)
Calcium: 8.3 mg/dL — ABNORMAL LOW (ref 8.9–10.3)
Chloride: 100 mmol/L (ref 98–111)
Creatinine, Ser: 0.74 mg/dL (ref 0.61–1.24)
GFR, Estimated: >60 mL/min (ref >60)
Glucose, Bld: 121 mg/dL — ABNORMAL HIGH (ref 70–99)
Potassium: 4.4 mmol/L (ref 3.5–5.1)
Sodium: 130 mmol/L — ABNORMAL LOW (ref 135–145)
Total Bilirubin: 0.5 mg/dL (ref <1.2)
Total Protein: 6.8 g/dL (ref 6.5–8.1)

## 2023-06-20 LAB — MAGNESIUM: Magnesium: 1.9 mg/dL (ref 1.7–2.4)

## 2023-06-20 LAB — PHOSPHORUS: Phosphorus: 3.8 mg/dL (ref 2.5–4.6)

## 2023-06-20 MED ORDER — GADOBUTROL 1 MMOL/ML IV SOLN
9.0000 mL | Freq: Once | INTRAVENOUS | Status: AC | PRN
  Administered 2023-06-20: 9 mL via INTRAVENOUS

## 2023-06-20 MED ORDER — FUROSEMIDE 40 MG PO TABS
40.0000 mg | ORAL_TABLET | Freq: Every day | ORAL | Status: DC
  Administered 2023-06-21: 40 mg via ORAL
  Filled 2023-06-20: qty 1

## 2023-06-20 MED ORDER — MELATONIN 3 MG PO TABS
3.0000 mg | ORAL_TABLET | Freq: Every evening | ORAL | Status: DC | PRN
  Administered 2023-06-20 – 2023-06-30 (×12): 3 mg via ORAL
  Filled 2023-06-20 (×15): qty 1

## 2023-06-20 NOTE — Progress Notes (Signed)
PHARMACY - PHYSICIAN COMMUNICATION CRITICAL VALUE ALERT - BLOOD CULTURE IDENTIFICATION (BCID)  Victor [name] is an 52 y.o. male who presented to [facility] on 06/17/2023 with a chief complaint of shortness of breath, fever   Name of physician (or Provider) Contacted: Dr. Arlean Hopping  Current antibiotics: Vancomycin, Cefepime, Flagyl  Changes to prescribed antibiotics recommended:  No changes for now  Results for orders placed or performed during the hospital encounter of 06/17/23  Blood Culture ID Panel (Reflexed) (Collected: 06/19/2023  2:41 AM)  Result Value Ref Range   Enterococcus faecalis NOT DETECTED NOT DETECTED   Enterococcus Faecium NOT DETECTED NOT DETECTED   Listeria monocytogenes NOT DETECTED NOT DETECTED   Staphylococcus species NOT DETECTED NOT DETECTED   Staphylococcus aureus (BCID) NOT DETECTED NOT DETECTED   Staphylococcus epidermidis NOT DETECTED NOT DETECTED   Staphylococcus lugdunensis NOT DETECTED NOT DETECTED   Streptococcus species DETECTED (A) NOT DETECTED   Streptococcus agalactiae NOT DETECTED NOT DETECTED   Streptococcus pneumoniae NOT DETECTED NOT DETECTED   Streptococcus pyogenes NOT DETECTED NOT DETECTED   A.calcoaceticus-baumannii NOT DETECTED NOT DETECTED   Bacteroides fragilis NOT DETECTED NOT DETECTED   Enterobacterales NOT DETECTED NOT DETECTED   Enterobacter cloacae complex NOT DETECTED NOT DETECTED   Escherichia coli NOT DETECTED NOT DETECTED   Klebsiella aerogenes NOT DETECTED NOT DETECTED   Klebsiella oxytoca NOT DETECTED NOT DETECTED   Klebsiella pneumoniae NOT DETECTED NOT DETECTED   Proteus species NOT DETECTED NOT DETECTED   Salmonella species NOT DETECTED NOT DETECTED   Serratia marcescens NOT DETECTED NOT DETECTED   Haemophilus influenzae NOT DETECTED NOT DETECTED   Neisseria meningitidis NOT DETECTED NOT DETECTED   Pseudomonas aeruginosa NOT DETECTED NOT DETECTED   Stenotrophomonas maltophilia NOT DETECTED NOT DETECTED   Candida  albicans NOT DETECTED NOT DETECTED   Candida auris NOT DETECTED NOT DETECTED   Candida glabrata NOT DETECTED NOT DETECTED   Candida krusei NOT DETECTED NOT DETECTED   Candida parapsilosis NOT DETECTED NOT DETECTED   Candida tropicalis NOT DETECTED NOT DETECTED   Cryptococcus neoformans/gattii NOT DETECTED NOT DETECTED    Abran Duke 06/20/2023  12:23 AM

## 2023-06-20 NOTE — Progress Notes (Signed)
   Patient Name: Victor [name] Date of Encounter: 06/20/2023 [facility] Cardiologist: Tonny Bollman, MD   Interval Summary  .    No chest pain or shortness of breath this morning. States that he had a fever overnight. Otherwise doing ok without complaints.   Vital Signs .    Vitals:   06/19/23 2352 06/20/23 0400 06/20/23 0417 06/20/23 0910  BP: (!) 112/59  (!) 107/57 121/62  Pulse: 97  96 95  Resp: 20  15 20   Temp: 98.9 F (37.2 C)  100.2 F (37.9 C) 98.2 F (36.8 C)  TempSrc: Oral  Oral Oral  SpO2: 96%  97% 96%  Weight:  85.7 kg    Height:        Intake/Output Summary (Last 24 hours) at 06/20/2023 1048 Last data filed at 06/20/2023 0911 Gross per 24 hour  Intake 2535.07 ml  Output 3400 ml  Net -864.93 ml      06/20/2023    4:00 AM 06/19/2023    5:31 AM 06/18/2023    2:15 PM  Last 3 Weights  Weight (lbs) 189 lb 188 lb 4.8 oz 190 lb 0.6 oz  Weight (kg) 85.73 kg 85.412 kg 86.2 kg      Telemetry/ECG    Sinus rhythm, sinus tach, no significant arrhythmia - Personally Reviewed  Physical Exam .   GEN: No acute distress.   Neck: No JVD Cardiac: RRR, 3/6 pansystolic murmur at the LLSB Respiratory: Clear to auscultation bilaterally. GI: Soft, nontender, non-distended  MS: No edema  Assessment & Plan .     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 Pt has diuresed well. His edema is markedly improved and weight down 11 pounds from admission. Would back off on diuretics to avoid over-diuresis with HCM physiology. Will change from IV to oral lasix today.  Moderate AS and MS. TEE tomorrow. See prior notes. Informed consent obtained. NPO after midnight.  Fever - reviewed [facility] notes. Blood culture data reviewed with 2 positive cultures for strep species. TEE will evaluate for endocarditis, no obvious vegetation on 2D echo imaging. Currently on cefepime, flagyl, and vanc Hyponatremia - stable with diuresis, slight improvement Na 128--->130 today Anemia, acute on  chronic, per primary team  Will follow. TEE tomorrow.   For questions or updates, please contact [facility] Please consult [email or web address] for contact info under        Signed, Tonny Bollman, MD

## 2023-06-20 NOTE — Progress Notes (Signed)
[facility] night cross cover note:   I was notified by RN of the patient's request for a sleep aid. I subsequently placed order for prn melatonin for insomnia.     Keelan Tripodi, DO Hospitalist  

## 2023-06-20 NOTE — Progress Notes (Signed)
PROGRESS NOTE    Victor [name]  WUJ:[id number] DOB: 1970/11/17 DOA: 06/17/2023 PCP: Claiborne Rigg, NP   Brief Narrative:  HPI per Dr. Ramon Dredge   Patient is a 52 years old male with past medical history of hyperlipidemia, obesity, hypertension presented to hospital with exertional shortness of breath, bilateral lower extremity edema and pain for the last 2 weeks.  Patient also complains of shortness of breath at rest and has been having difficulty lying down in bed.  Complains of mild cough with clear sputum production and has subjective fever at home.  Patient stated that he was taking prednisone and analgesics for his back pain and had been to a spine specialist as outpatient and had an MRI done yesterday.  Complains of some shooting pain on the lower legs.  Patient stated that he has been noticing increasing swelling of the legs over the last 2 weeks or so.  Patient denies any obvious blood in the stool but had some dark stools when he was taking prednisone.  His bowels have cleared up now.  He complains of gaseous feeling heartburn as well.  Patient admits to drinking alcohol 16 ounces not every day and denies any withdrawal symptoms from it.  Orthopnea or PND.  Was taking prednisone and analgesics as outpatient and noted some dark stools.  Denies any urinary urgency, frequency or dysuria.  Has not seen further black stools.  He complains of mild chest discomfort especially after eating food with some burning sensation.  Patient has been having difficulty ambulation due to dyspnea.  Denies any dizziness, lightheadedness or syncope.   In the ED, vitals were notable for tachycardia with tachypnea and temperature was borderline at 99.5 F.  Initial labs showed hyponatremia with sodium of 127 hemoglobin low at 8.3 and platelets low at 142.  BNP was elevated at 592.  Initial troponin I was 60.  EKG showed sinus tachycardia with right bundle branch block.  Chest x-ray did not show any infiltrate.  In  the ED patient received 40 mg of IV Lasix and was considered for admission to the hospital for new onset shortness of breath leg swelling.  **Interim History Cardiology was consulted and patient is being diuresed today but cardiology is concerned given that he has his mitral stenosis which is moderate with possible systolic anterior motion.  Cardiology recommending undergoing a TEE for more definitive evaluation.  On admission he had a temperature of unclear etiology and was not septic appearing but Blood Cx showing Streptococcal Species. Blood Cx being repeated and ID has been consulted. Have asked Radiology to Read the outpatient MRI done a few days ago and still not done.   Assessment and Plan:  Shortness of Breath, Dyspnea on Exertion with lower extremity swelling in the setting of Acute on Chronic Diastolic CHF Moderate AS and Moderate MS with possible Systolic Anterior Motion -Patient seems to be thinking that this is something new but did have a history of similar complaints in 2015 and had done 2D echocardiogram 2015 with LV function of 65%.  -BNP elevated at 592.9    -Possibility of anemia causing dyspnea as well.  -Chest x-ray was negative.   Received 40 mg of IV of Lasix in the ED and was on IV Lasix 40 mg BID but now Cardiology holding IV diuresis and changing to po Lasix today ordered after overdiuresis with his HCM physiology -Strict I's and O's and daily weights -Echocardiogram done and showed an LVEF of 70 to 75% but  did show concern for subvalvular obstruction with peak gradient as high as 117 mmHg with Valsalva.  Patient also noted to have moderate mitral stenosis with possible SAM and moderate aortic stenosis -Cardiology recommends continuing diuresis today but recommending that he undergo TEE for more definitive evaluation and we will attempt to placee the patient on the schedule for Friday -Given his concern for HCM they are going to add low-dose beta-blocker therapy with  metoprolol tartrate 12.5 mg p.o. twice daily -TSH was  -Appreciate Cardiology Evaluation and Recommendations  Chest Discomfort -Associated with Spicy Food -Mild troponin elevation and flat.   -Likely nonspecific or secondary to heart failure. -EKG without any ischemic changes.   -Added PPI with Pantoprazole 40 mg po daily -If necessary will place on Maalox. -Troponin I went from 60 -> 53 -ECHO showed EF of 70-75% -Allergy suspects that he has chest discomfort in the setting of his heart failure   History of Hypertension -Continue to monitor closely.   -On Amlodipine and Losartan at home.   -C/w IV Hydralazine 10 mg q4hprn HBP  -Continue to Monitor BP per Protocol -Last BP reading was was a little soft at 107/57   Fever in the setting of Streptococcal Bacteremia  -Spiked a Temp of 102.8 the night before last -Afebrile now but WBC is not elevated -WBC Trend: Recent Labs  Lab 06/17/23 2240 06/19/23 0239 06/20/23 0214  WBC 9.6 8.5 7.8  -Urinalysis was unremarkable -Blood Cx x2 done and showed GPC in Chains and was showing Streptococcal Species in 4/4 Cx's with Sensitivities pending; Repeat Blood Cx today -Respiratory virus panel via PCR is negative as well as SARS-CoV-2 and DG chest x-ray done and showed "Slightly more prominent interstitial markings at both lung bases could reflect mild interstitial edema or atypical infection." -Repeat CXR this AM pending  -Empirically placed on IV antibiotics with cefepime and IV Vancomycin and IV Metronidazole and will continue for now -De-escalate Abx as needed pending further culture results -Cardiology recommends a TEE to further evaluate his valvular and myocardial disease and also rule out infective endocarditis as a source of his fevers as there was no obvious vegetations on TTE -Will get ID involved for further evaluation and management and defer further Abx management to them   Thrombocytopenia, improving  -Patient drinks alcohol.    -No evidence of overt bleeding.    -Platelet Count Trend: Recent Labs  Lab 06/17/23 2240 06/19/23 0239 06/20/23 0214  PLT 142* 146* 155  -Continue to monitor for signs and symptoms of bleeding -No overt bleeding noted and repeat CBC in the a.m.  HLD -C/w Rosuvastatin 20 mg po Daily   Prediabetes -HbA1c was 5.9 -Glucose Trend:  Recent Labs  Lab 06/17/23 2240 06/19/23 0241 06/20/23 0214  GLUCOSE 134* 133* 121*  -If necessary will place on Sensitive Novolog SSI AC/HS   Hyponatremia -Uncertain etiology at this time but likely in the setting of volume overload -Renal function within normal range.   -Check TSH.   -Na+ Trend: Recent Labs  Lab 06/17/23 2240 06/19/23 0241 06/20/23 0214  NA 127* 128* 130*  -Could be related to drinking beer.   -Repeat CMP in the AM   Alcohol Use Disorder -Drinks 16 ounce beer probably every day.  Denies any withdrawal symptoms.   -Placed patient on Thiamine 100 mg po Daily,  Folic Acid 1 ,g po daily and Multivitamin + Minerals 1 tab po Daily.   -Closely monitor for any withdrawal symptoms.   Low Back Pain, Lumbar  Radiculopathy  -Had seen a spine specialist as outpatient and MRI of the lumbar spine was ordered on 06/16/2025.   -Completed course of prednisone.   -Currently taking Tylenol for pain.   -Will need to follow-up with orthopedics as outpatient.   -Will get PT OT evaluation while in the hospital..   -Added Oxycodone IR 5 mg po q4hprn and will continue  -Continue Gabapentin 300 mg po TID and muscle relaxants with Methocarbamol 500 mg po TID -Patient had an outpatient MRI on 06/17/2023 which has not yet resulted and I called yesterday to get it read as a Priority and still has not been read yet.   Normocytic Anemia -Hgb/Hct Trend: Recent Labs  Lab 06/17/23 2240 06/19/23 0239 06/20/23 0214  HGB 8.3* 8.0* 7.6*  HCT 26.9* 24.9* 24.4*  MCV 96.1 93.6 93.5  -FOBT negative but reported Dark Stools; Continue PPI with  Pantoprazole -May benefit from Iron Transfusion but will hold off given bacteremia -Hold ASA for now as well as NSAIDs -Anemia panel was done and showed an iron level of 18, UIBC of 237, TIBC 255, saturation ratios of 7%, ferritin of 248, folate level 9.8, vitamin B12 level 540 -Continue to Monitor for S/Sx of Bleeding; no overt bleeding noted -Repeat CBC in the AM  Hypoalbuminemia -Patient's Albumin Trend: Recent Labs  Lab 06/20/23 0214  ALBUMIN 2.3*  -Continue to Monitor and Trend and repeat CMP in the AM  Overweight -Complicates overall prognosis and care -Estimated body mass index is 29.6 kg/m as calculated from the following:   Height as of this encounter: 5\' 7"  (1.702 m).   Weight as of this encounter: 85.7 kg.  -Weight Loss and Dietary Counseling given   DVT prophylaxis: SCDs Start: 06/18/23 0858    Code Status: Full Code Family Communication: No family present at bedside   Disposition Plan:  Level of care: Telemetry Cardiac Status is: Inpatient Remains inpatient appropriate because: Needs further clinical improvement and will be getting a TEE tomorrow and needs further evaluation by infectious diseases  Consultants:  Cardiology Infectious Diseases  Procedures:  ECHOCARDIOGRAM IMPRESSIONS     1. Left ventricular ejection fraction, by estimation, is 70 to 75%. The  left ventricle has hyperdynamic function. The LV outflow tract is  narrowed. There is a mid-cavity gradient peak 48 mmHg. There appears to be  an LV outflow peak gradient as high as  117 mmHg with Valsalva. Possible mild systolic anterior motion of the  mitral valve. The left ventricle has no regional wall motion  abnormalities. There is mild concentric left ventricular hypertrophy. Left  ventricular diastolic parameters are consistent  with Grade II diastolic dysfunction (pseudonormalization).   2. Right ventricular systolic function is normal. The right ventricular  size is normal. Tricuspid  regurgitation signal is inadequate for assessing  PA pressure.   3. Left atrial size was moderately dilated.   4. The mitral valve is abnormal. The valve is moderately calcified and  restricted with calcification of the chords. Possible mild SAM but cannot  be definitive. Trivial mitral valve regurgitation. Probably moderate  mitral stenosis. The mean mitral valve  gradient is 11.0 mmHg.   5. The aortic valve was not well visualized. Aortic valve regurgitation  is not visualized. Moderate aortic valve stenosis. Aortic valve mean  gradient measures 22.0 mmHg.   6. The inferior vena cava is normal in size with <50% respiratory  variability, suggesting right atrial pressure of 8 mmHg.   7. Cannot rule out PFO vs small ASD (  possible color flow across septum).   8. This patient needs a TEE. There are a number of abnormalities that are  difficult to work out by TTE. He has a hyperdynamic left ventricle with a  mid-cavity gradient. The LVOT is also significantly narrowed, possibly  with SAM, with very high LVOT  gradient with valsalva. 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 However, the mitral valve and aortic valve themselves are  both calcified and thickened and appear to have moderate stenosis.   FINDINGS   Left Ventricle: Left ventricular ejection fraction, by estimation, is 70  to 75%. The left ventricle has hyperdynamic function. The left ventricle  has no regional wall motion abnormalities. The left ventricular internal  cavity size was normal in size.  There is mild concentric left ventricular hypertrophy. Left ventricular  diastolic parameters are consistent with Grade II diastolic dysfunction  (pseudonormalization).   Right Ventricle: The right ventricular size is normal. No increase in  right ventricular wall thickness. Right ventricular systolic function is  normal. Tricuspid regurgitation signal is inadequate for assessing PA  pressure.   Left Atrium: Left  atrial size was moderately dilated.   Right Atrium: Right atrial size was normal in size.   Pericardium: Trivial pericardial effusion is present.   Mitral Valve: The mitral valve is abnormal. There is moderate  calcification of the mitral valve leaflet(s). Moderate mitral annular  calcification. Trivial mitral valve regurgitation. Moderate mitral valve  stenosis. MV peak gradient, 19.0 mmHg. The mean  mitral valve gradient is 11.0 mmHg.   Tricuspid Valve: The tricuspid valve is normal in structure. Tricuspid  valve regurgitation is not demonstrated.   Aortic Valve: The aortic valve was not well visualized. Aortic valve  regurgitation is not visualized. Moderate aortic stenosis is present.  Aortic valve mean gradient measures 22.0 mmHg. Aortic valve peak gradient  measures 37.2 mmHg.   Pulmonic Valve: The pulmonic valve was normal in structure. Pulmonic valve  regurgitation is not visualized.   Aorta: The aortic root is normal in size and structure.   Venous: The inferior vena cava is normal in size with less than 50%  respiratory variability, suggesting right atrial pressure of 8 mmHg.   IAS/Shunts: Cannot rule out PFO vs small ASD (possible color flow across  septum).     LEFT VENTRICLE  PLAX 2D  LVIDd:         4.30 cm   Diastology  LVIDs:         2.70 cm   LV e' medial:    7.46 cm/s  LV PW:         1.20 cm   LV E/e' medial:  28.2  LV IVS:        1.00 cm   LV e' lateral:   9.48 cm/s  LVOT diam:     2.00 cm   LV E/e' lateral: 22.2  LVOT Area:     3.14 cm     RIGHT VENTRICLE             IVC  RV Basal diam:  2.60 cm     IVC diam: 2.10 cm  RV S prime:     16.00 cm/s  TAPSE (M-mode): 2.5 cm   LEFT ATRIUM             Index        RIGHT ATRIUM           Index  LA diam:  4.50 cm 2.27 cm/m   RA Area:     14.70 cm  LA Vol (A2C):   86.4 ml 43.66 ml/m  RA Volume:   36.50 ml  18.45 ml/m  LA Vol (A4C):   96.0 ml 48.51 ml/m  LA Biplane Vol: 92.2 ml 46.59 ml/m    AORTIC VALVE  AV Vmax:      305.00 cm/s  AV Vmean:     221.000 cm/s  AV VTI:       0.471 m  AV Peak Grad: 37.2 mmHg  AV Mean Grad: 22.0 mmHg    AORTA  Ao Root diam: 2.70 cm  Ao Asc diam:  2.80 cm   MITRAL VALVE  MV Area (PHT): 3.46 cm     SHUNTS  MV Peak grad:  19.0 mmHg    Systemic Diam: 2.00 cm  MV Mean grad:  11.0 mmHg  MV Vmax:       2.18 m/s  MV Vmean:      156.0 cm/s  MV Decel Time: 219 msec  MV E velocity: 210.00 cm/s  MV A velocity: 162.00 cm/s  MV E/A ratio:  1.30   Antimicrobials:  Anti-infectives (From admission, onward)    Start     Dose/Rate Route Frequency Ordered Stop   06/19/23 1600  vancomycin (VANCOREADY) IVPB 1250 mg/250 mL        1,250 mg 166.7 mL/hr over 90 Minutes Intravenous Every 12 hours 06/19/23 0323     06/19/23 1200  ceFEPIme (MAXIPIME) 2 g in sodium chloride 0.9 % 100 mL IVPB  Status:  Discontinued        2 g 200 mL/hr over 30 Minutes Intravenous Every 8 hours 06/19/23 0323 06/20/23 1156   06/19/23 0145  vancomycin (VANCOREADY) IVPB 1750 mg/350 mL        1,750 mg 175 mL/hr over 120 Minutes Intravenous  Once 06/19/23 0051 06/19/23 1044   06/19/23 0145  ceFEPIme (MAXIPIME) 2 g in sodium chloride 0.9 % 100 mL IVPB        2 g 200 mL/hr over 30 Minutes Intravenous  Once 06/19/23 0051 06/19/23 0908   06/19/23 0045  metroNIDAZOLE (FLAGYL) IVPB 500 mg  Status:  Discontinued        500 mg 100 mL/hr over 60 Minutes Intravenous Every 12 hours 06/19/23 0041 06/20/23 1156       Subjective: Seen and examined at bedside and thinks he is doing better today but states he did not sleep very well last night.  Sleepy today.  No nausea or vomiting.  Thinks his shortness of breath is better.  No other concerns or complaints at this time.  Objective: Vitals:   06/20/23 0400 06/20/23 0417 06/20/23 0910 06/20/23 1049  BP:  (!) 107/57 121/62 (!) 114/51  Pulse:  96 95 90  Resp:  15 20 17   Temp:  100.2 F (37.9 C) 98.2 F (36.8 C) 98 F (36.7 C)  TempSrc:   Oral Oral Oral  SpO2:  97% 96% 98%  Weight: 85.7 kg     Height:        Intake/Output Summary (Last 24 hours) at 06/20/2023 1221 Last data filed at 06/20/2023 1128 Gross per 24 hour  Intake 2535.07 ml  Output 3550 ml  Net -1014.93 ml   Filed Weights   06/18/23 1415 06/19/23 0531 06/20/23 0400  Weight: 86.2 kg 85.4 kg 85.7 kg   Examination: Physical Exam:  Constitutional: WN/WD overweight AAM in NAD Respiratory: Diminished to auscultation bilaterally, no wheezing,  rales, rhonchi or crackles. Normal respiratory effort and patient is not tachypenic. No accessory muscle use. Unlabored breathing  Cardiovascular: RRR, no murmurs / rubs / gallops. S1 and S2 auscultated. No extremity edema.  Abdomen: Soft, non-tender, distended 2/2 body habitus. Bowel sounds positive.  GU: Deferred. Musculoskeletal: No clubbing / cyanosis of digits/nails. No joint deformity upper and lower extremities.  Skin: No rashes, lesions, ulcers on a limited skin evaluation. No induration; Warm and dry.  Neurologic: CN 2-12 grossly intact with no focal deficits. Romberg sign and cerebellar reflexes not assessed.  Psychiatric: Normal judgment and insight. Alert and oriented x 3. Normal mood and appropriate affect.   Data Reviewed: I have personally reviewed following labs and imaging studies  CBC: Recent Labs  Lab 06/17/23 2240 06/19/23 0239 06/20/23 0214  WBC 9.6 8.5 7.8  NEUTROABS  --   --  5.2  HGB 8.3* 8.0* 7.6*  HCT 26.9* 24.9* 24.4*  MCV 96.1 93.6 93.5  PLT 142* 146* 155   Basic Metabolic Panel: Recent Labs  Lab 06/17/23 2240 06/19/23 0241 06/20/23 0214  NA 127* 128* 130*  K 3.7 4.0 4.4  CL 97* 96* 100  CO2 22 22 22   GLUCOSE 134* 133* 121*  BUN 7 7 13   CREATININE 0.63 0.87 0.74  CALCIUM 8.5* 8.4* 8.3*  MG  --  1.7 1.9  PHOS  --   --  3.8   GFR: Estimated Creatinine Clearance: 110.3 mL/min (by C-G formula based on SCr of 0.74 mg/dL). Liver Function Tests: Recent Labs  Lab  06/20/23 0214  AST 50*  ALT 33  ALKPHOS 48  BILITOT 0.5  PROT 6.8  ALBUMIN 2.3*   No results for input(s): "LIPASE", "AMYLASE" in the last 168 hours. No results for input(s): "AMMONIA" in the last 168 hours. Coagulation Profile: No results for input(s): "INR", "PROTIME" in the last 168 hours. Cardiac Enzymes: No results for input(s): "CKTOTAL", "CKMB", "CKMBINDEX", "TROPONINI" in the last 168 hours. BNP (last 3 results) No results for input(s): "PROBNP" in the last 8760 hours. HbA1C: Recent Labs    06/18/23 0900  HGBA1C 5.9*   CBG: No results for input(s): "GLUCAP" in the last 168 hours. Lipid Profile: No results for input(s): "CHOL", "HDL", "LDLCALC", "TRIG", "CHOLHDL", "LDLDIRECT" in the last 72 hours. Thyroid Function Tests: Recent Labs    06/18/23 0900  TSH 1.613   Anemia Panel: Recent Labs    06/18/23 0900 06/18/23 0907 06/18/23 1030  VITAMINB12  --  540  --   FOLATE  --  9.8  --   FERRITIN  --   --  248  TIBC  --   --  255  IRON  --   --  18*  RETICCTPCT 2.3  --   --    Sepsis Labs: Recent Labs  Lab 06/19/23 0239  LATICACIDVEN 1.4   Recent Results (from the past 240 hour(s))  Culture, blood (Routine X 2) w Reflex to ID Panel     Status: None (Preliminary result)   Collection Time: 06/19/23  2:39 AM   Specimen: BLOOD  Result Value Ref Range Status   Specimen Description BLOOD BLOOD LEFT ARM  Final   Special Requests   Final    BOTTLES DRAWN AEROBIC AND ANAEROBIC Blood Culture adequate volume   Culture  Setup Time   Final    GRAM POSITIVE COCCI IN CHAINS IN BOTH AEROBIC AND ANAEROBIC BOTTLES CRITICAL RESULT CALLED TO, READ BACK BY AND VERIFIED WITH: PHARMD JAMES LEDFORD ON 06/19/23 @  2313 BY DRT    Culture   Final    GRAM POSITIVE COCCI IN CHAINS CULTURE REINCUBATED FOR BETTER GROWTH Performed at [facility], [address], [place], [place] [address]    Report Status PENDING  Incomplete  Culture, blood (Routine X 2) w Reflex to ID  Panel     Status: None (Preliminary result)   Collection Time: 06/19/23  2:41 AM   Specimen: BLOOD  Result Value Ref Range Status   Specimen Description BLOOD BLOOD RIGHT ARM  Final   Special Requests   Final    BOTTLES DRAWN AEROBIC AND ANAEROBIC Blood Culture adequate volume   Culture  Setup Time   Final    GRAM POSITIVE COCCI IN CHAINS IN BOTH AEROBIC AND ANAEROBIC BOTTLES CRITICAL RESULT CALLED TO, READ BACK BY AND VERIFIED WITH: PHARMD JAMES LEDFORD ON 06/19/23 @ 2313 BY DRT    Culture   Final    GRAM POSITIVE COCCI CULTURE REINCUBATED FOR BETTER GROWTH Performed at [facility], [address] [place], [place] [address]    Report Status PENDING  Incomplete  Blood Culture ID Panel (Reflexed)     Status: Abnormal   Collection Time: 06/19/23  2:41 AM  Result Value Ref Range Status   Enterococcus faecalis NOT DETECTED NOT DETECTED Final   Enterococcus Faecium NOT DETECTED NOT DETECTED Final   Listeria monocytogenes NOT DETECTED NOT DETECTED Final   Staphylococcus species NOT DETECTED NOT DETECTED Final   Staphylococcus aureus (BCID) NOT DETECTED NOT DETECTED Final   Staphylococcus epidermidis NOT DETECTED NOT DETECTED Final   Staphylococcus lugdunensis NOT DETECTED NOT DETECTED Final   Streptococcus species DETECTED (A) NOT DETECTED Final    Comment: Not Enterococcus species, Streptococcus agalactiae, Streptococcus pyogenes, or Streptococcus pneumoniae. CRITICAL RESULT CALLED TO, READ BACK BY AND VERIFIED WITH: PHARMD JAMES LEDFORD ON 06/19/23 @ 2313 BY DRT    Streptococcus agalactiae NOT DETECTED NOT DETECTED Final   Streptococcus pneumoniae NOT DETECTED NOT DETECTED Final   Streptococcus pyogenes NOT DETECTED NOT DETECTED Final   A.calcoaceticus-baumannii NOT DETECTED NOT DETECTED Final   Bacteroides fragilis NOT DETECTED NOT DETECTED Final   Enterobacterales NOT DETECTED NOT DETECTED Final   Enterobacter cloacae complex NOT DETECTED NOT DETECTED Final   Escherichia  coli NOT DETECTED NOT DETECTED Final   Klebsiella aerogenes NOT DETECTED NOT DETECTED Final   Klebsiella oxytoca NOT DETECTED NOT DETECTED Final   Klebsiella pneumoniae NOT DETECTED NOT DETECTED Final   Proteus species NOT DETECTED NOT DETECTED Final   Salmonella species NOT DETECTED NOT DETECTED Final   Serratia marcescens NOT DETECTED NOT DETECTED Final   Haemophilus influenzae NOT DETECTED NOT DETECTED Final   Neisseria meningitidis NOT DETECTED NOT DETECTED Final   Pseudomonas aeruginosa NOT DETECTED NOT DETECTED Final   Stenotrophomonas maltophilia NOT DETECTED NOT DETECTED Final   Candida albicans NOT DETECTED NOT DETECTED Final   Candida auris NOT DETECTED NOT DETECTED Final   Candida glabrata NOT DETECTED NOT DETECTED Final   Candida krusei NOT DETECTED NOT DETECTED Final   Candida parapsilosis NOT DETECTED NOT DETECTED Final   Candida tropicalis NOT DETECTED NOT DETECTED Final   Cryptococcus neoformans/gattii NOT DETECTED NOT DETECTED Final    Comment: Performed at [facility], [address] [place], [place] [address]  SARS Coronavirus 2 by RT PCR (hospital order, performed in [facility] hospital lab) *cepheid single result test* Anterior Nasal Swab     Status: None   Collection Time: 06/19/23  2:45 AM   Specimen: Anterior Nasal Swab  Result Value Ref Range Status   SARS Coronavirus 2 by RT PCR NEGATIVE NEGATIVE Final    Comment: Performed at [facility], [address] [place], [place] [address]  Respiratory (~20 pathogens) panel by PCR     Status: None   Collection Time: 06/19/23  2:45 AM   Specimen: Nasopharyngeal Swab; Respiratory  Result Value Ref Range Status   Adenovirus NOT DETECTED NOT DETECTED Final   Coronavirus 229E NOT DETECTED NOT DETECTED Final    Comment: (NOTE) The Coronavirus on the Respiratory Panel, DOES NOT test for the novel  Coronavirus (2019 nCoV)    Coronavirus HKU1 NOT DETECTED NOT DETECTED Final   Coronavirus NL63 NOT DETECTED  NOT DETECTED Final   Coronavirus OC43 NOT DETECTED NOT DETECTED Final   Metapneumovirus NOT DETECTED NOT DETECTED Final   Rhinovirus / Enterovirus NOT DETECTED NOT DETECTED Final   Influenza A NOT DETECTED NOT DETECTED Final   Influenza B NOT DETECTED NOT DETECTED Final   Parainfluenza Virus 1 NOT DETECTED NOT DETECTED Final   Parainfluenza Virus 2 NOT DETECTED NOT DETECTED Final   Parainfluenza Virus 3 NOT DETECTED NOT DETECTED Final   Parainfluenza Virus 4 NOT DETECTED NOT DETECTED Final   Respiratory Syncytial Virus NOT DETECTED NOT DETECTED Final   Bordetella pertussis NOT DETECTED NOT DETECTED Final   Bordetella Parapertussis NOT DETECTED NOT DETECTED Final   Chlamydophila pneumoniae NOT DETECTED NOT DETECTED Final   Mycoplasma pneumoniae NOT DETECTED NOT DETECTED Final    Comment: Performed at [facility], [address] [place], [place] [address]    Radiology Studies: DG CHEST PORT 1 VIEW  Result Date: 06/20/2023 CLINICAL DATA:  Short of breath. EXAM: PORTABLE CHEST 1 VIEW COMPARISON:  06/19/2023 and older studies. FINDINGS: Cardiac silhouette is normal in size and configuration. Normal mediastinal and hilar contours. Clear lungs.  No pleural effusion or pneumothorax. Skeletal structures are grossly intact. IMPRESSION: No active disease. Electronically Signed   By: Amie Portland M.D.   On: 06/20/2023 09:08   DG CHEST PORT 1 VIEW  Result Date: 06/19/2023 CLINICAL DATA:  Fever. EXAM: PORTABLE CHEST 1 VIEW COMPARISON:  Chest x-ray dated June 17, 2023. FINDINGS: The heart size and mediastinal contours are within normal limits. Lower lung volumes compared to prior. Slightly more prominent interstitial markings at both lung bases. No focal consolidation, pleural effusion, or pneumothorax. No acute osseous abnormality. IMPRESSION: 1. Slightly more prominent interstitial markings at both lung bases could reflect mild interstitial edema or atypical infection. Electronically Signed    By: Obie Dredge M.D.   On: 06/19/2023 12:34   ECHOCARDIOGRAM COMPLETE  Result Date: 06/18/2023    ECHOCARDIOGRAM REPORT   Patient Name:   Victor [name] Date of Exam: 06/18/2023 Medical Rec #:  [id number]   Height:       67.0 in Accession #:    [id number]  Weight:       190.0 lb Date of Birth:  [birth year]    BSA:          1.979 m Patient Age:    52 years    BP:           122/58 mmHg Patient Gender: M           HR:           109 bpm. Exam Location:  Inpatient Procedure: 2D Echo, Cardiac Doppler and Color Doppler Indications:    acute systolic  chf  History:        Patient has prior history of Echocardiogram examinations, most                 recent 10/16/2013. Signs/Symptoms:Dyspnea; Risk                 Factors:Hypertension and Dyslipidemia.  Sonographer:    Delcie Roch RDCS Referring Phys: [phone] [facility] [place] IMPRESSIONS  1. Left ventricular ejection fraction, by estimation, is 70 to 75%. The left ventricle has hyperdynamic function. The LV outflow tract is narrowed. There is a mid-cavity gradient peak 48 mmHg. There appears to be an LV outflow peak gradient as high as 117 mmHg with Valsalva. Possible mild systolic anterior motion of the mitral valve. The left ventricle has no regional wall motion abnormalities. There is mild concentric left ventricular hypertrophy. Left ventricular diastolic parameters are consistent with Grade II diastolic dysfunction (pseudonormalization).  2. Right ventricular systolic function is normal. The right ventricular size is normal. Tricuspid regurgitation signal is inadequate for assessing PA pressure.  3. Left atrial size was moderately dilated.  4. The mitral valve is abnormal. The valve is moderately calcified and restricted with calcification of the chords. Possible mild SAM but cannot be definitive. Trivial mitral valve regurgitation. Probably moderate mitral stenosis. The mean mitral valve gradient is 11.0 mmHg.  5. The aortic valve was not well visualized. Aortic  valve regurgitation is not visualized. Moderate aortic valve stenosis. Aortic valve mean gradient measures 22.0 mmHg.  6. The inferior vena cava is normal in size with <50% respiratory variability, suggesting right atrial pressure of 8 mmHg.  7. Cannot rule out PFO vs small ASD (possible color flow across septum).  8. This patient needs a TEE. There are a number of abnormalities that are difficult to work out by TTE. He has a hyperdynamic left ventricle with a mid-cavity gradient. The LVOT is also significantly narrowed, possibly with SAM, with very high LVOT gradient with valsalva. 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 However, the mitral valve and aortic valve themselves are both calcified and thickened and appear to have moderate stenosis. FINDINGS  Left Ventricle: Left ventricular ejection fraction, by estimation, is 70 to 75%. The left ventricle has hyperdynamic function. The left ventricle has no regional wall motion abnormalities. The left ventricular internal cavity size was normal in size. There is mild concentric left ventricular hypertrophy. Left ventricular diastolic parameters are consistent with Grade II diastolic dysfunction (pseudonormalization). Right Ventricle: The right ventricular size is normal. No increase in right ventricular wall thickness. Right ventricular systolic function is normal. Tricuspid regurgitation signal is inadequate for assessing PA pressure. Left Atrium: Left atrial size was moderately dilated. Right Atrium: Right atrial size was normal in size. Pericardium: Trivial pericardial effusion is present. Mitral Valve: The mitral valve is abnormal. There is moderate calcification of the mitral valve leaflet(s). Moderate mitral annular calcification. Trivial mitral valve regurgitation. Moderate mitral valve stenosis. MV peak gradient, 19.0 mmHg. The mean mitral valve gradient is 11.0 mmHg. Tricuspid Valve: The tricuspid valve is normal in structure. Tricuspid valve  regurgitation is not demonstrated. Aortic Valve: The aortic valve was not well visualized. Aortic valve regurgitation is not visualized. Moderate aortic stenosis is present. Aortic valve mean gradient measures 22.0 mmHg. Aortic valve peak gradient measures 37.2 mmHg. Pulmonic Valve: The pulmonic valve was normal in structure. Pulmonic valve regurgitation is not visualized. Aorta: The aortic root is normal in size and structure. Venous: The inferior vena cava is normal in  size with less than 50% respiratory variability, suggesting right atrial pressure of 8 mmHg. IAS/Shunts: Cannot rule out PFO vs small ASD (possible color flow across septum).  LEFT VENTRICLE PLAX 2D LVIDd:         4.30 cm   Diastology LVIDs:         2.70 cm   LV e' medial:    7.46 cm/s LV PW:         1.20 cm   LV E/e' medial:  28.2 LV IVS:        1.00 cm   LV e' lateral:   9.48 cm/s LVOT diam:     2.00 cm   LV E/e' lateral: 22.2 LVOT Area:     3.14 cm  RIGHT VENTRICLE             IVC RV Basal diam:  2.60 cm     IVC diam: 2.10 cm RV S prime:     16.00 cm/s TAPSE (M-mode): 2.5 cm LEFT ATRIUM             Index        RIGHT ATRIUM           Index LA diam:        4.50 cm 2.27 cm/m   RA Area:     14.70 cm LA Vol (A2C):   86.4 ml 43.66 ml/m  RA Volume:   36.50 ml  18.45 ml/m LA Vol (A4C):   96.0 ml 48.51 ml/m LA Biplane Vol: 92.2 ml 46.59 ml/m  AORTIC VALVE AV Vmax:      305.00 cm/s AV Vmean:     221.000 cm/s AV VTI:       0.471 m AV Peak Grad: 37.2 mmHg AV Mean Grad: 22.0 mmHg  AORTA Ao Root diam: 2.70 cm Ao Asc diam:  2.80 cm MITRAL VALVE MV Area (PHT): 3.46 cm     SHUNTS MV Peak grad:  19.0 mmHg    Systemic Diam: 2.00 cm MV Mean grad:  11.0 mmHg MV Vmax:       2.18 m/s MV Vmean:      156.0 cm/s MV Decel Time: 219 msec MV E velocity: 210.00 cm/s MV A velocity: 162.00 cm/s MV E/A ratio:  1.30 Dalton McleanMD Electronically signed by Wilfred Lacy Signature Date/Time: 06/18/2023/3:44:12 PM    Final     Scheduled Meds:  docusate sodium  100 mg  Oral BID   folic acid  1 mg Oral Daily   [START ON 06/21/2023] furosemide  40 mg Oral Daily   gabapentin  300 mg Oral TID   methocarbamol  500 mg Oral TID   metoprolol tartrate  12.5 mg Oral BID   multivitamin with minerals  1 tablet Oral Daily   pantoprazole  40 mg Oral Daily   potassium chloride  40 mEq Oral Daily   rosuvastatin  20 mg Oral Daily   sodium chloride flush  3 mL Intravenous Q12H   thiamine  100 mg Oral Daily   Continuous Infusions:  vancomycin 1,250 mg (06/20/23 0551)    LOS: 2 days   Marguerita Merles, DO [facility] Available via Epic secure chat 7am-7pm After these hours, please refer to coverage provider listed on [email or web address] 06/20/2023, 12:21 PM

## 2023-06-20 NOTE — Consult Note (Addendum)
[facility]    Date of Admission:  06/17/2023   Total days of inpatient antibiotics 2        Reason for Consult: Strep bacteremia    Principal Problem:   Shortness of breath Active Problems:   HTN (hypertension)   Lumbar back pain with radiculopathy affecting lower extremity   Acute heart failure with preserved ejection fraction [facility])   Assessment: 52 year old male with history of low back pain for about a couple months radiating to bilateral legs, hypertension, hyperlipidemia presented to the ED with shortness of breath and bilateral lower extremity edema for about a couple weeks admitted for further evaluation found to have: #Strep species bacteremia - He had a lumbar spine that was done day prior to admission which has not been read yet he was seen by outpatient physician who prescribed oral prednisone and order MRI on 05/15/2023. - Blood cultures on admission grew 2/2 GPC, BC ID stress species - Patient on vancomycin, cefepime, metronidazole.Marland Kitchen  Hospitalist course complicated by Tmax of 1 and 2.8. - Given dyspnea on exertion cardiology consulted for CHF evaluation.  Echo showed concern for subvalvular obstruction, SAM, TEE planned for Friday.  # Question of alcohol use disorder - Management per primary - LFTs appear to be within normal limits, ALT 50. Recommendations:  -Discontinue cefepime metronidazole - Continue vancomycin - Follow blood cultures to ensure this clearance - Follow sensitivities from blood cultures from admission -  TEE Friday - MRI spine lumbar spine Microbiology:   Antibiotics: Vancomycin, cefepime, metronidazole 9/26 chest present  Cultures: Blood 11/27 2/2 Streptococcus species 9/28 pending Urine  Other   HPI: Victor [name] is a 52 y.o. male with past medical history of hyperlipidemia, obesity, hypertension presented to hospital exertional shortness of breath, bilateral lower extremity edema x 2 weeks.   Reported a mild cough.  He has a history of back pain for which she had MRI done day prior to admission.  He has also been on prednisone and analgesics.  On arrival to the ED temp 99.5, WBC 9.8K.  Admitted with shortness of breath.  Started on broad-spectrum antibiotics.  TTE showed EF 7075% with concern for subvalvular obstruction, possible SAM.  Recommended TEE for further evaluation.Marland Kitchen  He engaged as blood cultures requiring positive for Streptococcus.   Review of Systems: Review of Systems  All other systems reviewed and are negative.   Past Medical History:  Diagnosis Date   Dizziness 10/15/2013    "SWIMMY HEAD    "   Hypertension    Hypertriglyceridemia    Obesity     Social History   Tobacco Use   Smoking status: Never   Smokeless tobacco: Current    Types: Chew  Vaping Use   Vaping status: Never Used  Substance Use Topics   Alcohol use: Yes    Comment: 4 12 oz beers twice/wk.   Drug use: No    Family History  Problem Relation Age of Onset   CAD Mother        stenting @ 56; alive @ 66   Aneurysm Mother        thoracic s/p surgery @ 65   Lung disease Mother    Other Father        unknown   Scheduled Meds:  docusate sodium  100 mg Oral BID   folic acid  1 mg Oral Daily   [START ON 06/21/2023] furosemide  40 mg Oral Daily  gabapentin  300 mg Oral TID   methocarbamol  500 mg Oral TID   metoprolol tartrate  12.5 mg Oral BID   multivitamin with minerals  1 tablet Oral Daily   pantoprazole  40 mg Oral Daily   potassium chloride  40 mEq Oral Daily   rosuvastatin  20 mg Oral Daily   sodium chloride flush  3 mL Intravenous Q12H   thiamine  100 mg Oral Daily   Continuous Infusions:  vancomycin 1,250 mg (06/20/23 0551)   PRN Meds:.acetaminophen, guaiFENesin, hydrALAZINE, melatonin, menthol-cetylpyridinium, ondansetron **OR** ondansetron (ZOFRAN) IV, oxyCODONE, sodium chloride flush No Known Allergies  OBJECTIVE: Blood pressure (!) 114/51, pulse 90, temperature  98 F (36.7 C), temperature source Oral, resp. rate 17, height 5\' 7"  (1.702 m), weight 85.7 kg, SpO2 98%.  Physical Exam Constitutional:      General: He is not in acute distress.    Appearance: He is normal weight. He is not toxic-appearing.  HENT:     Head: Normocephalic and atraumatic.     Comments: adnetulous    Right Ear: External ear normal.     Left Ear: External ear normal.     Nose: No congestion or rhinorrhea.     Mouth/Throat:     Mouth: Mucous membranes are moist.     Pharynx: Oropharynx is clear.  Eyes:     Extraocular Movements: Extraocular movements intact.     Conjunctiva/sclera: Conjunctivae normal.     Pupils: Pupils are equal, round, and reactive to light.  Cardiovascular:     Rate and Rhythm: Normal rate and regular rhythm.     Heart sounds: No murmur heard.    No friction rub. No gallop.  Pulmonary:     Effort: Pulmonary effort is normal.     Breath sounds: Normal breath sounds.  Abdominal:     General: Abdomen is flat. Bowel sounds are normal.     Palpations: Abdomen is soft.  Musculoskeletal:        General: No swelling.     Cervical back: Normal range of motion and neck supple.  Skin:    General: Skin is warm and dry.  Neurological:     General: No focal deficit present.     Mental Status: He is oriented to person, place, and time.  Psychiatric:        Mood and Affect: Mood normal.     Lab Results Lab Results  Component Value Date   WBC 7.8 06/20/2023   HGB 7.6 (L) 06/20/2023   HCT 24.4 (L) 06/20/2023   MCV 93.5 06/20/2023   PLT 155 06/20/2023    Lab Results  Component Value Date   CREATININE 0.74 06/20/2023   BUN 13 06/20/2023   NA 130 (L) 06/20/2023   K 4.4 06/20/2023   CL 100 06/20/2023   CO2 22 06/20/2023    Lab Results  Component Value Date   ALT 33 06/20/2023   AST 50 (H) 06/20/2023   GGT 630 (H) 01/09/2019   ALKPHOS 48 06/20/2023   BILITOT 0.5 06/20/2023       Danelle Earthly, MD [facility] 06/20/2023, 11:56 AM I have personally spent 85 minutes involved in face-to-face and non-face-to-face activities for this patient on the day of the visit. Professional time spent includes the following activities: Preparing to see the patient (review of tests), Obtaining and/or reviewing separately obtained history (admission/discharge record), Performing a medically appropriate examination and/or evaluation , Ordering medications/tests/procedures, referring and  communicating with other health care professionals, Documenting clinical information in the EMR, Independently interpreting results (not separately reported), Communicating results to the patient/family/caregiver, Counseling and educating the patient/family/caregiver and Care coordination (not separately reported).

## 2023-06-20 NOTE — Plan of Care (Signed)

## 2023-06-21 ENCOUNTER — Inpatient Hospital Stay (HOSPITAL_COMMUNITY): Payer: Medicaid Other | Admitting: Anesthesiology

## 2023-06-21 ENCOUNTER — Encounter (HOSPITAL_COMMUNITY): Payer: Self-pay | Admitting: Internal Medicine

## 2023-06-21 ENCOUNTER — Encounter (HOSPITAL_COMMUNITY)
Admission: EM | Disposition: A | Discharge status: Home Health | Payer: Self-pay | Source: Home / Self Care | Attending: Internal Medicine

## 2023-06-21 ENCOUNTER — Inpatient Hospital Stay (HOSPITAL_COMMUNITY): Payer: Medicaid Other

## 2023-06-21 DIAGNOSIS — I5033 Acute on chronic diastolic (congestive) heart failure: Secondary | ICD-10-CM | POA: Diagnosis not present

## 2023-06-21 DIAGNOSIS — I340 Nonrheumatic mitral (valve) insufficiency: Secondary | ICD-10-CM

## 2023-06-21 DIAGNOSIS — D696 Thrombocytopenia, unspecified: Secondary | ICD-10-CM | POA: Diagnosis not present

## 2023-06-21 DIAGNOSIS — I059 Rheumatic mitral valve disease, unspecified: Secondary | ICD-10-CM | POA: Diagnosis not present

## 2023-06-21 DIAGNOSIS — I110 Hypertensive heart disease with heart failure: Secondary | ICD-10-CM | POA: Diagnosis not present

## 2023-06-21 DIAGNOSIS — B955 Unspecified streptococcus as the cause of diseases classified elsewhere: Secondary | ICD-10-CM | POA: Diagnosis not present

## 2023-06-21 DIAGNOSIS — I350 Nonrheumatic aortic (valve) stenosis: Secondary | ICD-10-CM

## 2023-06-21 DIAGNOSIS — I10 Essential (primary) hypertension: Secondary | ICD-10-CM | POA: Diagnosis not present

## 2023-06-21 DIAGNOSIS — K7460 Unspecified cirrhosis of liver: Secondary | ICD-10-CM | POA: Diagnosis not present

## 2023-06-21 DIAGNOSIS — R7881 Bacteremia: Secondary | ICD-10-CM | POA: Diagnosis not present

## 2023-06-21 DIAGNOSIS — I509 Heart failure, unspecified: Secondary | ICD-10-CM | POA: Diagnosis not present

## 2023-06-21 DIAGNOSIS — I5031 Acute diastolic (congestive) heart failure: Secondary | ICD-10-CM | POA: Diagnosis not present

## 2023-06-21 DIAGNOSIS — R509 Fever, unspecified: Secondary | ICD-10-CM | POA: Diagnosis not present

## 2023-06-21 DIAGNOSIS — M5416 Radiculopathy, lumbar region: Secondary | ICD-10-CM | POA: Diagnosis not present

## 2023-06-21 DIAGNOSIS — B3781 Candidal esophagitis: Secondary | ICD-10-CM | POA: Diagnosis not present

## 2023-06-21 DIAGNOSIS — R0602 Shortness of breath: Secondary | ICD-10-CM | POA: Diagnosis not present

## 2023-06-21 DIAGNOSIS — Z1152 Encounter for screening for COVID-19: Secondary | ICD-10-CM | POA: Diagnosis not present

## 2023-06-21 DIAGNOSIS — I051 Rheumatic mitral insufficiency: Secondary | ICD-10-CM | POA: Diagnosis not present

## 2023-06-21 DIAGNOSIS — I330 Acute and subacute infective endocarditis: Secondary | ICD-10-CM | POA: Diagnosis not present

## 2023-06-21 DIAGNOSIS — Q244 Congenital subaortic stenosis: Secondary | ICD-10-CM | POA: Diagnosis not present

## 2023-06-21 HISTORY — PX: TRANSESOPHAGEAL ECHOCARDIOGRAM (CATH LAB): EP1270

## 2023-06-21 LAB — CBC WITH DIFFERENTIAL/PLATELET
Abs Immature Granulocytes: 0.05 10*3/uL (ref 0.00–0.07)
Basophils Absolute: 0 10*3/uL (ref 0.0–0.1)
Basophils Relative: 1 %
Eosinophils Absolute: 0.2 10*3/uL (ref 0.0–0.5)
Eosinophils Relative: 2 %
HCT: 24.1 % — ABNORMAL LOW (ref 39.0–52.0)
Hemoglobin: 7.4 g/dL — ABNORMAL LOW (ref 13.0–17.0)
Immature Granulocytes: 1 %
Lymphocytes Relative: 20 %
Lymphs Abs: 1.6 10*3/uL (ref 0.7–4.0)
MCH: 29.1 pg (ref 26.0–34.0)
MCHC: 30.7 g/dL (ref 30.0–36.0)
MCV: 94.9 fL (ref 80.0–100.0)
Monocytes Absolute: 0.7 10*3/uL (ref 0.1–1.0)
Monocytes Relative: 8 %
Neutro Abs: 5.8 10*3/uL (ref 1.7–7.7)
Neutrophils Relative %: 68 %
Platelets: 170 10*3/uL (ref 150–400)
RBC: 2.54 MIL/uL — ABNORMAL LOW (ref 4.22–5.81)
RDW: 15.5 % (ref 11.5–15.5)
WBC: 8.3 10*3/uL (ref 4.0–10.5)
nRBC: 0.2 % (ref 0.0–0.2)

## 2023-06-21 LAB — MAGNESIUM: Magnesium: 1.8 mg/dL (ref 1.7–2.4)

## 2023-06-21 LAB — COMPREHENSIVE METABOLIC PANEL
ALT: 35 U/L (ref 0–44)
AST: 53 U/L — ABNORMAL HIGH (ref 15–41)
Albumin: 2.3 g/dL — ABNORMAL LOW (ref 3.5–5.0)
Alkaline Phosphatase: 56 U/L (ref 38–126)
Anion gap: 11 (ref 5–15)
BUN: 12 mg/dL (ref 6–20)
CO2: 18 mmol/L — ABNORMAL LOW (ref 22–32)
Calcium: 8.8 mg/dL — ABNORMAL LOW (ref 8.9–10.3)
Chloride: 102 mmol/L (ref 98–111)
Creatinine, Ser: 0.77 mg/dL (ref 0.61–1.24)
GFR, Estimated: >60 mL/min (ref >60)
Glucose, Bld: 122 mg/dL — ABNORMAL HIGH (ref 70–99)
Potassium: 4.5 mmol/L (ref 3.5–5.1)
Sodium: 131 mmol/L — ABNORMAL LOW (ref 135–145)
Total Bilirubin: 0.5 mg/dL (ref <1.2)
Total Protein: 6.8 g/dL (ref 6.5–8.1)

## 2023-06-21 LAB — ECHO TEE
AV Mean grad: 19.4 mm[Hg]
AV Peak grad: 38.1 mm[Hg]
Ao pk vel: 3.09 m/s

## 2023-06-21 LAB — PHOSPHORUS: Phosphorus: 3.9 mg/dL (ref 2.5–4.6)

## 2023-06-21 LAB — TROPONIN I (HIGH SENSITIVITY)
Troponin I (High Sensitivity): 63 ng/L — ABNORMAL HIGH (ref <18)
Troponin I (High Sensitivity): 97 ng/L — ABNORMAL HIGH (ref <18)

## 2023-06-21 SURGERY — TRANSESOPHAGEAL ECHOCARDIOGRAM (TEE) (CATHLAB)
Anesthesia: Monitor Anesthesia Care

## 2023-06-21 MED ORDER — SODIUM CHLORIDE 0.9 % IV SOLN
2.0000 g | INTRAVENOUS | Status: DC
  Administered 2023-06-21 – 2023-06-24 (×4): 2 g via INTRAVENOUS
  Filled 2023-06-21 (×4): qty 20

## 2023-06-21 MED ORDER — PROPOFOL 10 MG/ML IV BOLUS
INTRAVENOUS | Status: DC | PRN
  Administered 2023-06-21: 30 mg via INTRAVENOUS
  Administered 2023-06-21: 100 mg via INTRAVENOUS
  Administered 2023-06-21: 40 mg via INTRAVENOUS
  Administered 2023-06-21 (×3): 50 mg via INTRAVENOUS
  Administered 2023-06-21: 80 mg via INTRAVENOUS

## 2023-06-21 MED ORDER — MAGNESIUM SULFATE 2 GM/50ML IV SOLN
2.0000 g | Freq: Once | INTRAVENOUS | Status: AC
  Administered 2023-06-21: 2 g via INTRAVENOUS
  Filled 2023-06-21: qty 50

## 2023-06-21 MED ORDER — PROPOFOL 500 MG/50ML IV EMUL
INTRAVENOUS | Status: DC | PRN
  Administered 2023-06-21: 100 ug/kg/min via INTRAVENOUS

## 2023-06-21 MED ORDER — FUROSEMIDE 10 MG/ML IJ SOLN: 40.0000 mg | Freq: Two times a day (BID) | INTRAMUSCULAR | Status: DC

## 2023-06-21 MED ORDER — SODIUM CHLORIDE 0.9 % IV SOLN: INTRAVENOUS | Status: DC

## 2023-06-21 MED ORDER — FUROSEMIDE 10 MG/ML IJ SOLN
80.0000 mg | Freq: Once | INTRAMUSCULAR | Status: AC
  Administered 2023-06-21: 80 mg via INTRAVENOUS
  Filled 2023-06-21: qty 8

## 2023-06-21 NOTE — Progress Notes (Signed)
OT Cancellation Note  Patient Details Name: Fayne Ahn MRN: [id number] DOB: 10-11-1970   Cancelled Treatment:    Reason Eval/Treat Not Completed: Patient at procedure or test/ unavailable (Pt off unit for transesophageal echocardiogram. OT to reattempt to see pt at a later time as appropriate/available.)  Alivya Wegman "Orson Eva., OTR/L, MA Acute Rehab [phone]  Lendon Colonel 06/21/2023, 1:54 PM

## 2023-06-21 NOTE — Progress Notes (Signed)
Echocardiogram Echocardiogram Transesophageal has been performed.  Lucendia Herrlich 06/21/2023, 3:07 PM

## 2023-06-21 NOTE — Progress Notes (Signed)
   Patient Name: Camrin Reagan Date of Encounter: 06/21/2023 [facility] Cardiologist: Tonny Bollman, MD   Interval Summary  .    No chest pain or shortness of breath.  Tmax 100.1 yesterday.  Vital Signs .    Vitals:   06/20/23 1915 06/20/23 2351 06/21/23 0408 06/21/23 0806  BP: 116/64 116/61 125/67 115/71  Pulse:  94  89  Resp: 20 20 17 20   Temp: 97.8 F (36.6 C) 100.1 F (37.8 C) 98.9 F (37.2 C) 98.9 F (37.2 C)  TempSrc: Oral Oral Oral Oral  SpO2: 98% 97% 97% 96%  Weight:   86.2 kg   Height:        Intake/Output Summary (Last 24 hours) at 06/21/2023 0925 Last data filed at 06/21/2023 0732 Gross per 24 hour  Intake 480 ml  Output 2850 ml  Net -2370 ml      06/21/2023    4:08 AM 06/20/2023    4:00 AM 06/19/2023    5:31 AM  Last 3 Weights  Weight (lbs) 190 lb 1.6 oz 189 lb 188 lb 4.8 oz  Weight (kg) 86.229 kg 85.73 kg 85.412 kg      Telemetry/ECG    Sinus rhythm without arrhythmia - Personally Reviewed  Physical Exam .   GEN: No acute distress.   Neck: No JVD Cardiac: RRR, 2/6 pansystolic murmur at the left lower sternal border Respiratory: Clear to auscultation bilaterally. GI: Soft, nontender, non-distended  MS: No edema  Assessment & Plan .     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 Pt has diuresed well. His edema is markedly improved.  Changed to oral diuretic therapy yesterday.  Blood pressure stable in normal range.  Tachycardia has resolved.  Currently on furosemide and low-dose metoprolol.  Moderate AS and MS. TEE today. See prior notes. Informed consent obtained.  Fever - reviewed TR[facility] notes. Blood culture data reviewed with 2 positive cultures for strep species. TEE will evaluate for endocarditis, no obvious vegetation on 2D echo imaging.  Was on cefepime, flagyl, and vanc.  ID consulted and discontinued Flagyl and cefepime, now on vancomycin alone. Hyponatremia - stable with diuresis, slight improvement Na 128--->130---> 131  today Anemia, acute on chronic, hemoglobin stable at 7.4 today.  Management per primary team  We will follow-up with patient after his TEE today, on the schedule for 1 PM.  For questions or updates, please contact [facility] Please consult [email or web address] for contact info under        Signed, Tonny Bollman, MD

## 2023-06-21 NOTE — Plan of Care (Signed)

## 2023-06-21 NOTE — Plan of Care (Signed)

## 2023-06-21 NOTE — Progress Notes (Signed)
[facility]  Date of Admission:  06/17/2023   Total days of inpatient antibiotics 3  Principal Problem:   Shortness of breath Active Problems:   HTN (hypertension)   Lumbar back pain with radiculopathy affecting lower extremity   Acute heart failure with preserved ejection fraction [facility])          Assessment: 52 year old male with history of low back pain for about a couple months radiating to bilateral legs, hypertension, hyperlipidemia presented to the ED with shortness of breath and bilateral lower extremity edema for about a couple weeks admitted for further evaluation found to have: #Strep species bacteremia - He had a lumbar spine that was done day prior to admission which has not been read yet he was seen by outpatient physician who prescribed oral prednisone and order MRI on 05/15/2023. - Blood cultures on admission grew 2/2 GPC, BC ID stress species - Patient on vancomycin, cefepime, metronidazole.Marland Kitchen  Hospitalist course complicated by Tmax of 1 and 2.8. - Given dyspnea on exertion cardiology consulted for CHF evaluation.  Echo showed concern for subvalvular obstruction, SAM,  -TEE showed MV vegetation (0.4x0.4cm) with degenerative calcified leaflets and sever MR -MRI lumbar spine showed L5-S1 reactive endplate enhancement with infection diffilult o exclude # Question of alcohol use disorder - Management per primary - LFTs appear to be within normal limits, ALT 50. -Denies IVDA  Recommendations: -Continue ceftriaxone -Place PICC if blood Cx remain negative x 72H -Follow repeat blood cx -Nsy input per vertebral osteo -CTS input per valvular vegeaton   Microbiology:   Antibiotics: Vancomycin, cefepime, metronidazole 11/26 - p  Cultures: Blood 11/27 2/2 Streptococcus species 11/28 pending Urine   SUBJECTIVE: Sitting in bed. No new complaints.  Interval: Afebrile overnight. No new complaints.   Review of Systems: Review of  Systems  All other systems reviewed and are negative.    Scheduled Meds:  docusate sodium  100 mg Oral BID   folic acid  1 mg Oral Daily   furosemide  40 mg Oral Daily   gabapentin  300 mg Oral TID   methocarbamol  500 mg Oral TID   metoprolol tartrate  12.5 mg Oral BID   multivitamin with minerals  1 tablet Oral Daily   pantoprazole  40 mg Oral Daily   potassium chloride  40 mEq Oral Daily   rosuvastatin  20 mg Oral Daily   sodium chloride flush  3 mL Intravenous Q12H   thiamine  100 mg Oral Daily   Continuous Infusions:  cefTRIAXone (ROCEPHIN)  IV 2 g (06/21/23 1204)   PRN Meds:.acetaminophen, guaiFENesin, hydrALAZINE, melatonin, menthol-cetylpyridinium, ondansetron **OR** ondansetron (ZOFRAN) IV, oxyCODONE, sodium chloride flush No Known Allergies  OBJECTIVE: Vitals:   06/21/23 0806 06/21/23 1143 06/21/23 1245 06/21/23 1534  BP: 115/71 (!) 105/52 108/71 110/70  Pulse: 89 78 77 80  Resp: 20 19  19   Temp: 98.9 F (37.2 C) 98 F (36.7 C) 98.3 F (36.8 C) 98.3 F (36.8 C)  TempSrc: Oral Oral Temporal Oral  SpO2: 96% 96% 95% 99%  Weight:      Height:       Body mass index is 29.77 kg/m.  Physical Exam Constitutional:      General: He is not in acute distress.    Appearance: He is normal weight. He is not toxic-appearing.  HENT:     Head: Normocephalic and atraumatic.     Right Ear: External ear normal.  Left Ear: External ear normal.     Nose: No congestion or rhinorrhea.     Mouth/Throat:     Mouth: Mucous membranes are moist.     Pharynx: Oropharynx is clear.  Eyes:     Extraocular Movements: Extraocular movements intact.     Conjunctiva/sclera: Conjunctivae normal.     Pupils: Pupils are equal, round, and reactive to light.  Cardiovascular:     Rate and Rhythm: Normal rate and regular rhythm.     Heart sounds: No murmur heard.    No friction rub. No gallop.  Pulmonary:     Effort: Pulmonary effort is normal.     Breath sounds: Normal breath  sounds.  Abdominal:     General: Abdomen is flat. Bowel sounds are normal.     Palpations: Abdomen is soft.  Musculoskeletal:        General: No swelling. Normal range of motion.     Cervical back: Normal range of motion and neck supple.  Skin:    General: Skin is warm and dry.  Neurological:     General: No focal deficit present.     Mental Status: He is oriented to person, place, and time.  Psychiatric:        Mood and Affect: Mood normal.       Lab Results Lab Results  Component Value Date   WBC 8.3 06/21/2023   HGB 7.4 (L) 06/21/2023   HCT 24.1 (L) 06/21/2023   MCV 94.9 06/21/2023   PLT 170 06/21/2023    Lab Results  Component Value Date   CREATININE 0.77 06/21/2023   BUN 12 06/21/2023   NA 131 (L) 06/21/2023   K 4.5 06/21/2023   CL 102 06/21/2023   CO2 18 (L) 06/21/2023    Lab Results  Component Value Date   ALT 35 06/21/2023   AST 53 (H) 06/21/2023   GGT 630 (H) 01/09/2019   ALKPHOS 56 06/21/2023   BILITOT 0.5 06/21/2023        Danelle Earthly, MD [facility] 06/21/2023, 5:50 PM I have personally spent 52 minutes involved in face-to-face and non-face-to-face activities for this patient on the day of the visit. Professional time spent includes the following activities: Preparing to see the patient (review of tests), Obtaining and/or reviewing separately obtained history (admission/discharge record), Performing a medically appropriate examination and/or evaluation , Ordering medications/tests/procedures, referring and communicating with other health care professionals, Documenting clinical information in the EMR, Independently interpreting results (not separately reported), Communicating results to the patient/family/caregiver, Counseling and educating the patient/family/caregiver and Care coordination (not separately reported).

## 2023-06-21 NOTE — Anesthesia Preprocedure Evaluation (Addendum)
Anesthesia Evaluation  Patient identified by MRN, date of birth, ID band Patient awake    Reviewed: Allergy & Precautions, NPO status , Patient's Chart, lab work & pertinent test results, reviewed documented beta blocker date and time   Airway Mallampati: II  TM Distance: >3 FB     Dental  (+) Edentulous Upper, Edentulous Lower   Pulmonary shortness of breath and with exertion   Pulmonary exam normal breath sounds clear to auscultation       Cardiovascular hypertension, Pt. on medications and Pt. on home beta blockers + Valvular Problems/Murmurs AS  Rhythm:Regular Rate:Normal + Systolic murmurs Echo 06/18/23  1. Left ventricular ejection fraction, by estimation, is 70 to 75%. The  left ventricle has hyperdynamic function. The LV outflow tract is  narrowed. There is a mid-cavity gradient peak 48 mmHg. There appears to be  an LV outflow peak gradient as high as  117 mmHg with Valsalva. Possible mild systolic anterior motion of the  mitral valve. The left ventricle has no regional wall motion  abnormalities. There is mild concentric left ventricular hypertrophy. Left  ventricular diastolic parameters are consistent  with Grade II diastolic dysfunction (pseudonormalization).   2. Right ventricular systolic function is normal. The right ventricular  size is normal. Tricuspid regurgitation signal is inadequate for assessing  PA pressure.   3. Left atrial size was moderately dilated.   4. The mitral valve is abnormal. The valve is moderately calcified and  restricted with calcification of the chords. Possible mild SAM but cannot  be definitive. Trivial mitral valve regurgitation. Probably moderate  mitral stenosis. The mean mitral valve  gradient is 11.0 mmHg.   5. The aortic valve was not well visualized. Aortic valve regurgitation  is not visualized. Moderate aortic valve stenosis. Aortic valve mean  gradient measures 22.0 mmHg.    6. The inferior vena cava is normal in size with <50% respiratory  variability, suggesting right atrial pressure of 8 mmHg.   7. Cannot rule out PFO vs small ASD (possible color flow across septum).   8. This patient needs a TEE. There are a number of abnormalities that are  difficult to work out by TTE. He has a hyperdynamic left ventricle with a  mid-cavity gradient. The LVOT is also significantly narrowed, possibly  with SAM, with very high LVOT  gradient with valsalva. 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 However, the mitral valve and aortic valve themselves are  both calcified and thickened and appear to have moderate stenosis.   EKG 06/18/23 ST, RBBB + LAFB, LVH with repolarization abnormality      Neuro/Psych  Neuromuscular disease  negative psych ROS   GI/Hepatic negative GI ROS, Neg liver ROS,,,  Endo/Other  Hyperlipidemia  Renal/GU negative Renal ROS  negative genitourinary   Musculoskeletal Chronic LBP   Abdominal   Peds  Hematology negative hematology ROS (+)   Anesthesia Other Findings   Reproductive/Obstetrics                              Anesthesia Physical Anesthesia Plan  ASA: 3  Anesthesia Plan: MAC   Post-op Pain Management: Minimal or no pain anticipated   Induction: Intravenous  PONV Risk Score and Plan: 1 and Treatment may vary due to age or medical condition and Propofol infusion  Airway Management Planned: Natural Airway and Nasal Cannula  Additional Equipment:   Intra-op Plan:   Post-operative Plan:   Informed  Consent: I have reviewed the patients History and Physical, chart, labs and discussed the procedure including the risks, benefits and alternatives for the proposed anesthesia with the patient or authorized representative who has indicated his/her understanding and acceptance.     Dental advisory given  Plan Discussed with: CRNA and Anesthesiologist  Anesthesia Plan Comments:           Anesthesia Quick Evaluation

## 2023-06-21 NOTE — Plan of Care (Signed)
  Problem: Activity: Goal: Risk for activity intolerance will decrease Outcome: Progressing   Problem: Elimination: Goal: Will not experience complications related to urinary retention Outcome: Progressing   

## 2023-06-21 NOTE — Progress Notes (Signed)
Cardiology progress note:  Called to see patient for shortness of breath and chest pain.  The patient underwent transesophageal echo this afternoon and I discussed the findings with Dr. Bufford Buttner.  The patient had been doing fine until the last 30 minutes when he developed increased shortness of breath and chest discomfort.  At the time of my evaluation this evening, his daughter is present at the bedside and I was able to update her on the patient's condition.  The patient states that he is now feeling better after sitting upright for a few minutes.  He is still mildly short of breath but this is improving.  We checked his temperature and he is currently afebrile.  An EKG is performed and shows sinus tachycardia heart rate 106 bpm, right bundle branch block with left anterior fascicular block.  No changes compared to his previous EKG 4 days ago.  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  He has a subaortic membrane, chordal SAM, and severe MR as above.  I think he will require continued treatment of infective endocarditis as well as surgical evaluation for timing of mitral valve replacement and subaortic membrane resection as far as definitive treatment.  If he can be stabilized medically, it would probably be best to treat him fully for endocarditis before considering cardiac surgery.  For tonight, I am going to diurese him with furosemide 80 mg IV x 1, check a chest x-ray tomorrow morning, and our team will continue to follow with you.  Plan explained to the patient and his daughter who both understand and agree.  Tonny Bollman 06/21/2023 6:17 PM

## 2023-06-21 NOTE — Progress Notes (Signed)
Mobility Specialist Progress Note:    06/21/23 1025  Mobility  Activity Ambulated with assistance in hallway  Level of Assistance Contact guard assist, steadying assist  Assistive Device Cane  Distance Ambulated (ft) 200 ft  Activity Response Tolerated well  Mobility Referral Yes  $Mobility charge 1 Mobility  Mobility Specialist Start Time (ACUTE ONLY) [id number]  Mobility Specialist Stop Time (ACUTE ONLY) 0850  Mobility Specialist Time Calculation (min) (ACUTE ONLY) 12 min   Pt received in bed agreeable to mobility. MinA needed for bed mobility, contact guard for STS and ambulation. Towards EOS pt c/o mild lightheadedness, took BP, VSS. Returned to supine in bed, call bell and personal belongings in reach.  Post Mobility BP 134/74 HR 91  Thompson Grayer Mobility Specialist  Please contact vis Secure Chat or  Rehab Office [phone]

## 2023-06-21 NOTE — Progress Notes (Signed)
     [address]       [name] [id number]             [phone]       Consult received Chart and echo reviewed Full note to follow

## 2023-06-21 NOTE — Progress Notes (Signed)
PROGRESS NOTE    Victor [name]  NGE:[id number] DOB: [birth year] DOA: 06/17/2023 PCP: Claiborne Rigg, NP   Brief Narrative:  HPI per Dr. Ramon Dredge   Patient is a 52 years old male with past medical history of hyperlipidemia, obesity, hypertension presented to hospital with exertional shortness of breath, bilateral lower extremity edema and pain for the last 2 weeks.  Patient also complains of shortness of breath at rest and has been having difficulty lying down in bed.  Complains of mild cough with clear sputum production and has subjective fever at home.  Patient stated that he was taking prednisone and analgesics for his back pain and had been to a spine specialist as outpatient and had an MRI done yesterday.  Complains of some shooting pain on the lower legs.  Patient stated that he has been noticing increasing swelling of the legs over the last 2 weeks or so.  Patient denies any obvious blood in the stool but had some dark stools when he was taking prednisone.  His bowels have cleared up now.  He complains of gaseous feeling heartburn as well.  Patient admits to drinking alcohol 16 ounces not every day and denies any withdrawal symptoms from it.  Orthopnea or PND.  Was taking prednisone and analgesics as outpatient and noted some dark stools.  Denies any urinary urgency, frequency or dysuria.  Has not seen further black stools.  He complains of mild chest discomfort especially after eating food with some burning sensation.  Patient has been having difficulty ambulation due to dyspnea.  Denies any dizziness, lightheadedness or syncope.   In the ED, vitals were notable for tachycardia with tachypnea and temperature was borderline at 99.5 F.  Initial labs showed hyponatremia with sodium of 127 hemoglobin low at 8.3 and platelets low at 142.  BNP was elevated at 592.  Initial troponin I was 60.  EKG showed sinus tachycardia with right bundle branch block.  Chest x-ray did not show any infiltrate.  In  the ED patient received 40 mg of IV Lasix and was considered for admission to the hospital for new onset shortness of breath leg swelling.  **Interim History Cardiology was consulted and patient is being diuresed today but cardiology is concerned given that he has his mitral stenosis which is moderate with possible systolic anterior motion.  Cardiology recommending undergoing a TEE for more definitive evaluation.  On admission he had a temperature of unclear etiology and was not septic appearing but Blood Cx showing Streptococcal Species. Blood Cx being repeated 06/20/23 and ID has been consulted and Abx just de-escalated to IV Vancomycin. Have asked Radiology to Read the outpatient MRI done a few days ago and still not done but ID felt that his MRI should be repeated and he underwent MRI of the lumbar spine with contrast yesterday.  Repeat blood cultures currently showing no growth to date and TEE is being done today.  Assessment and Plan:  Shortness of Breath, Dyspnea on Exertion with lower extremity swelling in the setting of Acute on Chronic Diastolic CHF Moderate AS and Moderate MS with possible Systolic Anterior Motion -Patient seems to be thinking that this is something new but did have a history of similar complaints in 2015 and had done 2D echocardiogram 2015 with LV function of 65%.  -BNP elevated at 592.9    -Possibility of anemia causing dyspnea as well.  -Chest x-ray was negative.   Received 40 mg of IV of Lasix in the ED and was  on IV Lasix 40 mg BID but now Cardiology holding IV diuresis and changing to po Lasix today ordered after overdiuresis with his HCM physiology -Strict I's and O's and daily weights -Echocardiogram done and showed an LVEF of 70 to 75% but did show concern for subvalvular obstruction with peak gradient as high as 117 mmHg with Valsalva.  Patient also noted to have moderate mitral stenosis with possible SAM and moderate aortic stenosis -Cardiology recommends  continuing diuresis today but recommending that he undergo TEE for more definitive evaluation and we will attempt to placee the patient on the schedule for Friday -Given his concern for HCM they are going to add low-dose beta-blocker therapy with metoprolol tartrate 12.5 mg p.o. twice daily -TSH was  -Appreciate Cardiology Evaluation and Recommendations and he is getting a TEE  Chest Discomfort, improved  -Associated with Spicy Food -Mild troponin elevation and flat.   -Likely nonspecific or secondary to heart failure. -EKG without any ischemic changes.   -Added PPI with Pantoprazole 40 mg po daily -If necessary will place on Maalox. -Troponin I went from 60 -> 53 -ECHO showed EF of 70-75% -Cardiology suspects that he has chest discomfort in the setting of his heart failure   History of Hypertension -Continue to monitor closely.   -On Amlodipine and Losartan at home.   -C/w IV Hydralazine 10 mg q4hprn HBP  -Continue to Monitor BP per Protocol -Last BP reading was was a little soft at 108/71   Fever in the setting of Streptococcal Bacteremia  -Spiked a Temp of 102.8 the night before last -Afebrile now but WBC is not elevated -WBC Trend: Recent Labs  Lab 06/17/23 2240 06/19/23 0239 06/20/23 0214 06/21/23 0224  WBC 9.6 8.5 7.8 8.3  -Urinalysis was unremarkable -Blood Cx x2 done and showed GPC in Chains and was showing Streptococcal Species in 4/4 Cx's with Sensitivities pending; Repeat Blood Cx 06/20/23 showing NGTD at 1 Day -Respiratory virus panel via PCR is negative as well as SARS-CoV-2 and DG chest x-ray done and showed "Slightly more prominent interstitial markings at both lung bases could reflect mild interstitial edema or atypical infection." -Repeat CXR yesterday and showed no active Disease  -Empirically placed on IV antibiotics with cefepime and IV Vancomycin and IV Metronidazole and now ID discontinued IV Metronidazole and IV Cefepime and will just continue IV  Vancomycin -De-escalate Abx as needed pending further culture results -Cardiology recommends a TEE to further evaluate his valvular and myocardial disease and also rule out infective endocarditis as a source of his fevers as there was no obvious vegetations on TTE; TEE being done today currently -Will get ID involved for further evaluation and management and defer further Abx management to them   Thrombocytopenia, improving  -Patient drinks alcohol.   -No evidence of overt bleeding.    -Platelet Count Trend: Recent Labs  Lab 06/17/23 2240 06/19/23 0239 06/20/23 0214 06/21/23 0224  PLT 142* 146* 155 170  -Continue to monitor for signs and symptoms of bleeding -No overt bleeding noted and repeat CBC in the a.m.  HLD -C/w Rosuvastatin 20 mg po Daily but may need to hold if AST continues to worsen  Elevated AST -LFT Trend: Recent Labs  Lab 06/20/23 0214 06/21/23 0224  AST 50* 53*  ALT 33 35  -Continue to Monitor and Trend and if Necessary will obtain RUQ U/S and an Acute Hepatitis Panel in the AM -Repeat CMP within 1 week  Prediabetes -HbA1c was 5.9 -Glucose Trend:  Recent Labs  Lab 06/17/23 2240 06/19/23 0241 06/20/23 0214 06/21/23 0224  GLUCOSE 134* 133* 121* 122*  -If necessary will place on Sensitive Novolog SSI AC/HS   Hyponatremia -Uncertain etiology at this time but likely in the setting of volume overload -Renal function within normal range.   -Check TSH.   -Na+ Trend: Recent Labs  Lab 06/17/23 2240 06/19/23 0241 06/20/23 0214 06/21/23 0224  NA 127* 128* 130* 131*  -Could be related to drinking beer.   -Repeat CMP in the AM   Alcohol Use Disorder -Drinks 16 ounce beer probably every day.  Denies any withdrawal symptoms.   -Placed patient on Thiamine 100 mg po Daily,  Folic Acid 1 ,g po daily and Multivitamin + Minerals 1 tab po Daily.   -Closely monitor for any withdrawal symptoms.   Low Back Pain, Lumbar Radiculopathy  -Had seen a spine  specialist as outpatient and MRI of the lumbar spine was ordered on 06/16/2025.   -Completed course of prednisone.   -Currently taking Tylenol for pain.   -Will need to follow-up with orthopedics as outpatient.   -Will get PT OT evaluation while in the hospital..   -Added Oxycodone IR 5 mg po q4hprn and will continue  -Continue Gabapentin 300 mg po TID and muscle relaxants with Methocarbamol 500 mg po TID -Patient had an outpatient MRI on 06/17/2023 which has not yet resulted and I called yesterday to get it read as a Priority and still has not been read yet.  -ID repeated MRI Lumbar Spine with Contrast and showed "Reactive endplate change with endplate enhancement about the L5-S1 interspace, favored to be degenerative in nature, although possible changes of infection are difficult to exclude, and could be considered in the correct clinical setting. If the clinical picture is equivocal for spinal infection, a short interval follow-up exam to evaluate for interval changes may be helpful for further evaluation as warranted. No other evidence for acute or active infection elsewhere within the lumbar spine. Advanced degenerative disc disease at L5-S1 with resultant mild bilateral L5 foraminal stenosis. Additional mild spondylosis elsewhere within the lumbar spine as above. No other significant stenosis or impingement."  Normocytic Anemia -Hgb/Hct Trend: Recent Labs  Lab 06/17/23 2240 06/19/23 0239 06/20/23 0214 06/21/23 0224  HGB 8.3* 8.0* 7.6* 7.4*  HCT 26.9* 24.9* 24.4* 24.1*  MCV 96.1 93.6 93.5 94.9  -FOBT negative but reported Dark Stools; Continue PPI with Pantoprazole -May benefit from Iron Transfusion but will hold off given bacteremia -Hold ASA for now as well as NSAIDs -Anemia panel was done and showed an iron level of 18, UIBC of 237, TIBC 255, saturation ratios of 7%, ferritin of 248, folate level 9.8, vitamin B12 level 540 -Continue to Monitor for S/Sx of Bleeding; no overt bleeding  noted -Repeat CBC in the AM  Hypoalbuminemia -Patient's Albumin Trend: Recent Labs  Lab 06/20/23 0214 06/21/23 0224  ALBUMIN 2.3* 2.3*  -Continue to Monitor and Trend and repeat CMP in the AM  Overweight -Complicates overall prognosis and care -Estimated body mass index is 29.77 kg/m as calculated from the following:   Height as of this encounter: 5\' 7"  (1.702 m).   Weight as of this encounter: 86.2 kg.  -Weight Loss and Dietary Counseling given   DVT prophylaxis: SCDs Start: 06/18/23 0858    Code Status: Full Code Family Communication: Discussed with family present at bedside  Disposition Plan:  Level of care: Telemetry Cardiac Status is: Inpatient Remains inpatient appropriate because: Needs further clinical improvement and clearance by  ID and Cardiology    Consultants:  Cardiology Infectious Diseases   Procedures:  ECHOCARDIOGRAM IMPRESSIONS     1. Left ventricular ejection fraction, by estimation, is 70 to 75%. The  left ventricle has hyperdynamic function. The LV outflow tract is  narrowed. There is a mid-cavity gradient peak 48 mmHg. There appears to be  an LV outflow peak gradient as high as  117 mmHg with Valsalva. Possible mild systolic anterior motion of the  mitral valve. The left ventricle has no regional wall motion  abnormalities. There is mild concentric left ventricular hypertrophy. Left  ventricular diastolic parameters are consistent  with Grade II diastolic dysfunction (pseudonormalization).   2. Right ventricular systolic function is normal. The right ventricular  size is normal. Tricuspid regurgitation signal is inadequate for assessing  PA pressure.   3. Left atrial size was moderately dilated.   4. The mitral valve is abnormal. The valve is moderately calcified and  restricted with calcification of the chords. Possible mild SAM but cannot  be definitive. Trivial mitral valve regurgitation. Probably moderate  mitral stenosis. The mean  mitral valve  gradient is 11.0 mmHg.   5. The aortic valve was not well visualized. Aortic valve regurgitation  is not visualized. Moderate aortic valve stenosis. Aortic valve mean  gradient measures 22.0 mmHg.   6. The inferior vena cava is normal in size with <50% respiratory  variability, suggesting right atrial pressure of 8 mmHg.   7. Cannot rule out PFO vs small ASD (possible color flow across septum).   8. This patient needs a TEE. There are a number of abnormalities that are  difficult to work out by TTE. He has a hyperdynamic left ventricle with a  mid-cavity gradient. The LVOT is also significantly narrowed, possibly  with SAM, with very high LVOT  gradient with valsalva. 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 However, the mitral valve and aortic valve themselves are  both calcified and thickened and appear to have moderate stenosis.   FINDINGS   Left Ventricle: Left ventricular ejection fraction, by estimation, is 70  to 75%. The left ventricle has hyperdynamic function. The left ventricle  has no regional wall motion abnormalities. The left ventricular internal  cavity size was normal in size.  There is mild concentric left ventricular hypertrophy. Left ventricular  diastolic parameters are consistent with Grade II diastolic dysfunction  (pseudonormalization).   Right Ventricle: The right ventricular size is normal. No increase in  right ventricular wall thickness. Right ventricular systolic function is  normal. Tricuspid regurgitation signal is inadequate for assessing PA  pressure.   Left Atrium: Left atrial size was moderately dilated.   Right Atrium: Right atrial size was normal in size.   Pericardium: Trivial pericardial effusion is present.   Mitral Valve: The mitral valve is abnormal. There is moderate  calcification of the mitral valve leaflet(s). Moderate mitral annular  calcification. Trivial mitral valve regurgitation. Moderate mitral valve   stenosis. MV peak gradient, 19.0 mmHg. The mean  mitral valve gradient is 11.0 mmHg.   Tricuspid Valve: The tricuspid valve is normal in structure. Tricuspid  valve regurgitation is not demonstrated.   Aortic Valve: The aortic valve was not well visualized. Aortic valve  regurgitation is not visualized. Moderate aortic stenosis is present.  Aortic valve mean gradient measures 22.0 mmHg. Aortic valve peak gradient  measures 37.2 mmHg.   Pulmonic Valve: The pulmonic valve was normal in structure. Pulmonic valve  regurgitation is not visualized.  Aorta: The aortic root is normal in size and structure.   Venous: The inferior vena cava is normal in size with less than 50%  respiratory variability, suggesting right atrial pressure of 8 mmHg.   IAS/Shunts: Cannot rule out PFO vs small ASD (possible color flow across  septum).     LEFT VENTRICLE  PLAX 2D  LVIDd:         4.30 cm   Diastology  LVIDs:         2.70 cm   LV e' medial:    7.46 cm/s  LV PW:         1.20 cm   LV E/e' medial:  28.2  LV IVS:        1.00 cm   LV e' lateral:   9.48 cm/s  LVOT diam:     2.00 cm   LV E/e' lateral: 22.2  LVOT Area:     3.14 cm     RIGHT VENTRICLE             IVC  RV Basal diam:  2.60 cm     IVC diam: 2.10 cm  RV S prime:     16.00 cm/s  TAPSE (M-mode): 2.5 cm   LEFT ATRIUM             Index        RIGHT ATRIUM           Index  LA diam:        4.50 cm 2.27 cm/m   RA Area:     14.70 cm  LA Vol (A2C):   86.4 ml 43.66 ml/m  RA Volume:   36.50 ml  18.45 ml/m  LA Vol (A4C):   96.0 ml 48.51 ml/m  LA Biplane Vol: 92.2 ml 46.59 ml/m   AORTIC VALVE  AV Vmax:      305.00 cm/s  AV Vmean:     221.000 cm/s  AV VTI:       0.471 m  AV Peak Grad: 37.2 mmHg  AV Mean Grad: 22.0 mmHg    AORTA  Ao Root diam: 2.70 cm  Ao Asc diam:  2.80 cm   MITRAL VALVE  MV Area (PHT): 3.46 cm     SHUNTS  MV Peak grad:  19.0 mmHg    Systemic Diam: 2.00 cm  MV Mean grad:  11.0 mmHg  MV Vmax:       2.18 m/s   MV Vmean:      156.0 cm/s  MV Decel Time: 219 msec  MV E velocity: 210.00 cm/s  MV A velocity: 162.00 cm/s  MV E/A ratio:  1.30   TRANSESOPHAGEAL ECHOCARDIOGRAM  Antimicrobials:  Anti-infectives (From admission, onward)    Start     Dose/Rate Route Frequency Ordered Stop   06/21/23 1115  [MAR Hold]  cefTRIAXone (ROCEPHIN) 2 g in sodium chloride 0.9 % 100 mL IVPB        (MAR Hold since Fri 06/21/2023 at 1245.Hold Reason: Transfer to a Procedural area)   2 g 200 mL/hr over 30 Minutes Intravenous Every 24 hours 06/21/23 1017     06/19/23 1600  vancomycin (VANCOREADY) IVPB 1250 mg/250 mL  Status:  Discontinued        1,250 mg 166.7 mL/hr over 90 Minutes Intravenous Every 12 hours 06/19/23 0323 06/21/23 1017   06/19/23 1200  ceFEPIme (MAXIPIME) 2 g in sodium chloride 0.9 % 100 mL IVPB  Status:  Discontinued  2 g 200 mL/hr over 30 Minutes Intravenous Every 8 hours 06/19/23 0323 06/20/23 1156   06/19/23 0145  vancomycin (VANCOREADY) IVPB 1750 mg/350 mL        1,750 mg 175 mL/hr over 120 Minutes Intravenous  Once 06/19/23 0051 06/19/23 1044   06/19/23 0145  ceFEPIme (MAXIPIME) 2 g in sodium chloride 0.9 % 100 mL IVPB        2 g 200 mL/hr over 30 Minutes Intravenous  Once 06/19/23 0051 06/19/23 0908   06/19/23 0045  metroNIDAZOLE (FLAGYL) IVPB 500 mg  Status:  Discontinued        500 mg 100 mL/hr over 60 Minutes Intravenous Every 12 hours 06/19/23 0041 06/20/23 1156       Subjective: Seen and examined at bedside and is sitting in bedside chair and still having some back pain.  States that he is extremely hungry and wanting something to eat.  Objective: Vitals:   06/21/23 0408 06/21/23 0806 06/21/23 1143 06/21/23 1245  BP: 125/67 115/71 (!) 105/52 108/71  Pulse:  89 78 77  Resp: 17 20 19    Temp: 98.9 F (37.2 C) 98.9 F (37.2 C) 98 F (36.7 C) 98.3 F (36.8 C)  TempSrc: Oral Oral Oral Temporal  SpO2: 97% 96% 96% 95%  Weight: 86.2 kg     Height:        Intake/Output  Summary (Last 24 hours) at 06/21/2023 1422 Last data filed at 06/21/2023 1402 Gross per 24 hour  Intake 480 ml  Output 1950 ml  Net -1470 ml   Filed Weights   06/19/23 0531 06/20/23 0400 06/21/23 0408  Weight: 85.4 kg 85.7 kg 86.2 kg   Examination: Physical Exam:  Constitutional: WN/WD overweight African-American male in no acute distress Respiratory: Diminished to auscultation bilaterally, no wheezing, rales, rhonchi or crackles. Normal respiratory effort and patient is not tachypenic. No accessory muscle use.  Unlabored breathing Cardiovascular: RRR, no murmurs / rubs / gallops. S1 and S2 auscultated. No extremity edema Abdomen: Soft, non-tender, distended secondary to body habitus. Bowel sounds positive.  GU: Deferred. Musculoskeletal: No clubbing / cyanosis of digits/nails. No joint deformity upper and lower extremities.  Skin: No rashes, lesions, ulcers on limited skin evaluation. No induration; Warm and dry.  Neurologic: CN 2-12 grossly intact with no focal deficits. Romberg sign and cerebellar reflexes not assessed.  Psychiatric: Normal judgment and insight. Alert and oriented x 3. Normal mood and appropriate affect.   Data Reviewed: I have personally reviewed following labs and imaging studies  CBC: Recent Labs  Lab 06/17/23 2240 06/19/23 0239 06/20/23 0214 06/21/23 0224  WBC 9.6 8.5 7.8 8.3  NEUTROABS  --   --  5.2 5.8  HGB 8.3* 8.0* 7.6* 7.4*  HCT 26.9* 24.9* 24.4* 24.1*  MCV 96.1 93.6 93.5 94.9  PLT 142* 146* 155 170   Basic Metabolic Panel: Recent Labs  Lab 06/17/23 2240 06/19/23 0241 06/20/23 0214 06/21/23 0224  NA 127* 128* 130* 131*  K 3.7 4.0 4.4 4.5  CL 97* 96* 100 102  CO2 22 22 22  18*  GLUCOSE 134* 133* 121* 122*  BUN 7 7 13 12   CREATININE 0.63 0.87 0.74 0.77  CALCIUM 8.5* 8.4* 8.3* 8.8*  MG  --  1.7 1.9 1.8  PHOS  --   --  3.8 3.9   GFR: Estimated Creatinine Clearance: 110.6 mL/min (by C-G formula based on SCr of 0.77 mg/dL). Liver  Function Tests: Recent Labs  Lab 06/20/23 0214 06/21/23 0224  AST 50*  53*  ALT 33 35  ALKPHOS 48 56  BILITOT 0.5 0.5  PROT 6.8 6.8  ALBUMIN 2.3* 2.3*   No results for input(s): "LIPASE", "AMYLASE" in the last 168 hours. No results for input(s): "AMMONIA" in the last 168 hours. Coagulation Profile: No results for input(s): "INR", "PROTIME" in the last 168 hours. Cardiac Enzymes: No results for input(s): "CKTOTAL", "CKMB", "CKMBINDEX", "TROPONINI" in the last 168 hours. BNP (last 3 results) No results for input(s): "PROBNP" in the last 8760 hours. HbA1C: No results for input(s): "HGBA1C" in the last 72 hours. CBG: No results for input(s): "GLUCAP" in the last 168 hours. Lipid Profile: No results for input(s): "CHOL", "HDL", "LDLCALC", "TRIG", "CHOLHDL", "LDLDIRECT" in the last 72 hours. Thyroid Function Tests: No results for input(s): "TSH", "T4TOTAL", "FREET4", "T3FREE", "THYROIDAB" in the last 72 hours. Anemia Panel: No results for input(s): "VITAMINB12", "FOLATE", "FERRITIN", "TIBC", "IRON", "RETICCTPCT" in the last 72 hours. Sepsis Labs: Recent Labs  Lab 06/19/23 0239  LATICACIDVEN 1.4   Recent Results (from the past 240 hour(s))  Culture, blood (Routine X 2) w Reflex to ID Panel     Status: None (Preliminary result)   Collection Time: 06/19/23  2:39 AM   Specimen: BLOOD  Result Value Ref Range Status   Specimen Description BLOOD BLOOD LEFT ARM  Final   Special Requests   Final    BOTTLES DRAWN AEROBIC AND ANAEROBIC Blood Culture adequate volume   Culture  Setup Time   Final    GRAM POSITIVE COCCI IN CHAINS IN BOTH AEROBIC AND ANAEROBIC BOTTLES CRITICAL RESULT CALLED TO, READ BACK BY AND VERIFIED WITH: PHARMD JAMES LEDFORD ON 06/19/23 @ 2313 BY DRT    Culture   Final    GRAM POSITIVE COCCI IN CHAINS CULTURE REINCUBATED FOR BETTER GROWTH Performed at [facility], [address], [place], [place] [address]    Report Status PENDING  Incomplete  Culture,  blood (Routine X 2) w Reflex to ID Panel     Status: None (Preliminary result)   Collection Time: 06/19/23  2:41 AM   Specimen: BLOOD  Result Value Ref Range Status   Specimen Description BLOOD BLOOD RIGHT ARM  Final   Special Requests   Final    BOTTLES DRAWN AEROBIC AND ANAEROBIC Blood Culture adequate volume   Culture  Setup Time   Final    GRAM POSITIVE COCCI IN CHAINS IN BOTH AEROBIC AND ANAEROBIC BOTTLES CRITICAL RESULT CALLED TO, READ BACK BY AND VERIFIED WITH: PHARMD JAMES LEDFORD ON 06/19/23 @ 2313 BY DRT    Culture   Final    GRAM POSITIVE COCCI IDENTIFICATION TO FOLLOW Performed at [facility], [address], [place], [place] [address]    Report Status PENDING  Incomplete  Blood Culture ID Panel (Reflexed)     Status: Abnormal   Collection Time: 06/19/23  2:41 AM  Result Value Ref Range Status   Enterococcus faecalis NOT DETECTED NOT DETECTED Final   Enterococcus Faecium NOT DETECTED NOT DETECTED Final   Listeria monocytogenes NOT DETECTED NOT DETECTED Final   Staphylococcus species NOT DETECTED NOT DETECTED Final   Staphylococcus aureus (BCID) NOT DETECTED NOT DETECTED Final   Staphylococcus epidermidis NOT DETECTED NOT DETECTED Final   Staphylococcus lugdunensis NOT DETECTED NOT DETECTED Final   Streptococcus species DETECTED (A) NOT DETECTED Final    Comment: Not Enterococcus species, Streptococcus agalactiae, Streptococcus pyogenes, or Streptococcus pneumoniae. CRITICAL RESULT CALLED TO, READ BACK BY AND VERIFIED WITH: PHARMD JAMES LEDFORD  ON 06/19/23 @ 2313 BY DRT    Streptococcus agalactiae NOT DETECTED NOT DETECTED Final   Streptococcus pneumoniae NOT DETECTED NOT DETECTED Final   Streptococcus pyogenes NOT DETECTED NOT DETECTED Final   A.calcoaceticus-baumannii NOT DETECTED NOT DETECTED Final   Bacteroides fragilis NOT DETECTED NOT DETECTED Final   Enterobacterales NOT DETECTED NOT DETECTED Final   Enterobacter cloacae complex NOT DETECTED NOT  DETECTED Final   Escherichia coli NOT DETECTED NOT DETECTED Final   Klebsiella aerogenes NOT DETECTED NOT DETECTED Final   Klebsiella oxytoca NOT DETECTED NOT DETECTED Final   Klebsiella pneumoniae NOT DETECTED NOT DETECTED Final   Proteus species NOT DETECTED NOT DETECTED Final   Salmonella species NOT DETECTED NOT DETECTED Final   Serratia marcescens NOT DETECTED NOT DETECTED Final   Haemophilus influenzae NOT DETECTED NOT DETECTED Final   Neisseria meningitidis NOT DETECTED NOT DETECTED Final   Pseudomonas aeruginosa NOT DETECTED NOT DETECTED Final   Stenotrophomonas maltophilia NOT DETECTED NOT DETECTED Final   Candida albicans NOT DETECTED NOT DETECTED Final   Candida auris NOT DETECTED NOT DETECTED Final   Candida glabrata NOT DETECTED NOT DETECTED Final   Candida krusei NOT DETECTED NOT DETECTED Final   Candida parapsilosis NOT DETECTED NOT DETECTED Final   Candida tropicalis NOT DETECTED NOT DETECTED Final   Cryptococcus neoformans/gattii NOT DETECTED NOT DETECTED Final    Comment: Performed at [facility], [address] [place], [place] [address]  SARS Coronavirus 2 by RT PCR (hospital order, performed in [facility] hospital lab) *cepheid single result test* Anterior Nasal Swab     Status: None   Collection Time: 06/19/23  2:45 AM   Specimen: Anterior Nasal Swab  Result Value Ref Range Status   SARS Coronavirus 2 by RT PCR NEGATIVE NEGATIVE Final    Comment: Performed at [facility], [address] [place], [place] [address]  Respiratory (~20 pathogens) panel by PCR     Status: None   Collection Time: 06/19/23  2:45 AM   Specimen: Nasopharyngeal Swab; Respiratory  Result Value Ref Range Status   Adenovirus NOT DETECTED NOT DETECTED Final   Coronavirus 229E NOT DETECTED NOT DETECTED Final    Comment: (NOTE) The Coronavirus on the Respiratory Panel, DOES NOT test for the novel  Coronavirus (2019 nCoV)    Coronavirus HKU1 NOT DETECTED NOT DETECTED Final    Coronavirus NL63 NOT DETECTED NOT DETECTED Final   Coronavirus OC43 NOT DETECTED NOT DETECTED Final   Metapneumovirus NOT DETECTED NOT DETECTED Final   Rhinovirus / Enterovirus NOT DETECTED NOT DETECTED Final   Influenza A NOT DETECTED NOT DETECTED Final   Influenza B NOT DETECTED NOT DETECTED Final   Parainfluenza Virus 1 NOT DETECTED NOT DETECTED Final   Parainfluenza Virus 2 NOT DETECTED NOT DETECTED Final   Parainfluenza Virus 3 NOT DETECTED NOT DETECTED Final   Parainfluenza Virus 4 NOT DETECTED NOT DETECTED Final   Respiratory Syncytial Virus NOT DETECTED NOT DETECTED Final   Bordetella pertussis NOT DETECTED NOT DETECTED Final   Bordetella Parapertussis NOT DETECTED NOT DETECTED Final   Chlamydophila pneumoniae NOT DETECTED NOT DETECTED Final   Mycoplasma pneumoniae NOT DETECTED NOT DETECTED Final    Comment: Performed at [facility], [address] [place], [place] [address]  Culture, blood (Routine X 2) w Reflex to ID Panel     Status: None (Preliminary result)   Collection Time: 06/20/23  9:40 AM   Specimen: BLOOD LEFT ARM  Result Value  Ref Range Status   Specimen Description BLOOD LEFT ARM  Final   Special Requests   Final    BOTTLES DRAWN AEROBIC AND ANAEROBIC Blood Culture adequate volume   Culture   Final    NO GROWTH 1 DAY Performed at [facility], [address] [place], [place] [address]    Report Status PENDING  Incomplete  Culture, blood (Routine X 2) w Reflex to ID Panel     Status: None (Preliminary result)   Collection Time: 06/20/23  9:49 AM   Specimen: BLOOD RIGHT HAND  Result Value Ref Range Status   Specimen Description BLOOD RIGHT HAND  Final   Special Requests   Final    BOTTLES DRAWN AEROBIC AND ANAEROBIC Blood Culture adequate volume   Culture   Final    NO GROWTH 1 DAY Performed at [facility], [address], [place], [place] [address]    Report Status PENDING  Incomplete    Radiology Studies: MR LUMBAR SPINE W  CONTRAST  Result Date: 06/20/2023 CLINICAL DATA:  Initial evaluation for low back pain, evaluate for infection. EXAM: MRI LUMBAR SPINE WITH CONTRAST TECHNIQUE: Multiplanar and multiecho pulse sequences of the lumbar spine were obtained with intravenous contrast. CONTRAST:  9mL GADAVIST GADOBUTROL 1 MMOL/ML IV SOLN COMPARISON:  Prior study from 06/17/2023. FINDINGS: Segmentation: Standard. Lowest well-formed disc space labeled the L5-S1 level. Alignment: 6 mm degenerative retrolisthesis of L5 on S1. Alignment otherwise normal preservation of the normal lumbar lordosis. Vertebrae: Vertebral body height maintained without acute or chronic fracture. Bone marrow signal intensity heterogeneous and decreased on T1 weighted imaging, nonspecific, but most commonly related to anemia, smoking or obesity. No worrisome or aggressive osseous lesions. Reactive endplate change with endplate enhancement seen about the L5-S1 interspace. No significant surrounding paraspinous inflammatory changes. No fluid signal intensity within the intervening L5-S1 disc space on prior MRI. Findings are favored to be degenerative in nature. Possible early changes of infection are difficult to exclude. No other evidence for acute or active infection elsewhere within the lumbar spine. No other abnormal enhancement. Conus medullaris and cauda equina: Conus extends to the L1 level. Conus and cauda equina appear normal. Paraspinal and other soft tissues: Paraspinous soft tissues demonstrate no acute finding. Disc levels: L1-2:  Normal interspace.  Mild facet hypertrophy.  No stenosis. L2-3: Normal interspace. Mild facet hypertrophy. No significant stenosis. L3-4: Mild far lateral disc bulging. Mild bilateral facet hypertrophy. No spinal stenosis. Foramina remain patent. L4-5: Mild disc bulge. Moderate bilateral facet hypertrophy. No spinal stenosis. Foramina remain patent. L5-S1: Advanced degenerative intervertebral disc space narrowing with  retrolisthesis. Diffuse disc bulge with reactive endplate spurring. Resultant posterior disc osteophyte complex closely approximates the descending S1 nerve roots without frank impingement or displacement. Mild facet hypertrophy. No significant spinal stenosis. Mild bilateral L5 foraminal stenosis. IMPRESSION: 1. Reactive endplate change with endplate enhancement about the L5-S1 interspace, favored to be degenerative in nature, although possible changes of infection are difficult to exclude, and could be considered in the correct clinical setting. If the clinical picture is equivocal for spinal infection, a short interval follow-up exam to evaluate for interval changes may be helpful for further evaluation as warranted. 2. No other evidence for acute or active infection elsewhere within the lumbar spine. 3. Advanced degenerative disc disease at L5-S1 with resultant mild bilateral L5 foraminal stenosis. 4. Additional mild spondylosis elsewhere within the lumbar spine as above. No other significant stenosis or impingement. Electronically Signed   By: Sharlet Salina  Phill Myron M.D.   On: 06/20/2023 20:59   DG CHEST PORT 1 VIEW  Result Date: 06/20/2023 CLINICAL DATA:  Short of breath. EXAM: PORTABLE CHEST 1 VIEW COMPARISON:  06/19/2023 and older studies. FINDINGS: Cardiac silhouette is normal in size and configuration. Normal mediastinal and hilar contours. Clear lungs.  No pleural effusion or pneumothorax. Skeletal structures are grossly intact. IMPRESSION: No active disease. Electronically Signed   By: Amie Portland M.D.   On: 06/20/2023 09:08    Scheduled Meds:  [MAR Hold] docusate sodium  100 mg Oral BID   [MAR Hold] folic acid  1 mg Oral Daily   [MAR Hold] furosemide  40 mg Oral Daily   [MAR Hold] gabapentin  300 mg Oral TID   [MAR Hold] methocarbamol  500 mg Oral TID   [MAR Hold] metoprolol tartrate  12.5 mg Oral BID   [MAR Hold] multivitamin with minerals  1 tablet Oral Daily   [MAR Hold] pantoprazole   40 mg Oral Daily   [MAR Hold] potassium chloride  40 mEq Oral Daily   [MAR Hold] rosuvastatin  20 mg Oral Daily   [MAR Hold] sodium chloride flush  3 mL Intravenous Q12H   [MAR Hold] thiamine  100 mg Oral Daily   Continuous Infusions:  sodium chloride 20 mL/hr at 06/21/23 1400   [MAR Hold] cefTRIAXone (ROCEPHIN)  IV 2 g (06/21/23 1204)    LOS: 3 days   Marguerita Merles, DO [facility] Available via Epic secure chat 7am-7pm After these hours, please refer to coverage provider listed on [email or web address] 06/21/2023, 2:22 PM

## 2023-06-21 NOTE — Interval H&P Note (Signed)
History and Physical Interval Note:  06/21/2023 12:55 PM  Victor [name]  has presented today for surgery, with the diagnosis of AS.  The various methods of treatment have been discussed with the patient and family. After consideration of risks, benefits and other options for treatment, the patient has consented to  Procedure(s): TRANSESOPHAGEAL ECHOCARDIOGRAM (N/A) as a surgical intervention.  The patient's history has been reviewed, patient examined, no change in status, stable for surgery.  I have reviewed the patient's chart and labs.  Questions were answered to the patient's satisfaction.    NPO for TEE.   Gerri Spore T. Flora Lipps, MD, [facility]  [facility]  [address], [address] [place], [place] [address] [phone]  12:55 PM

## 2023-06-21 NOTE — H&P (View-Only) (Signed)
   Patient Name: Camrin Reagan Date of Encounter: 06/21/2023 [facility] Cardiologist: Tonny Bollman, MD   Interval Summary  .    No chest pain or shortness of breath.  Tmax 100.1 yesterday.  Vital Signs .    Vitals:   06/20/23 1915 06/20/23 2351 06/21/23 0408 06/21/23 0806  BP: 116/64 116/61 125/67 115/71  Pulse:  94  89  Resp: 20 20 17 20   Temp: 97.8 F (36.6 C) 100.1 F (37.8 C) 98.9 F (37.2 C) 98.9 F (37.2 C)  TempSrc: Oral Oral Oral Oral  SpO2: 98% 97% 97% 96%  Weight:   86.2 kg   Height:        Intake/Output Summary (Last 24 hours) at 06/21/2023 0925 Last data filed at 06/21/2023 0732 Gross per 24 hour  Intake 480 ml  Output 2850 ml  Net -2370 ml      06/21/2023    4:08 AM 06/20/2023    4:00 AM 06/19/2023    5:31 AM  Last 3 Weights  Weight (lbs) 190 lb 1.6 oz 189 lb 188 lb 4.8 oz  Weight (kg) 86.229 kg 85.73 kg 85.412 kg      Telemetry/ECG    Sinus rhythm without arrhythmia - Personally Reviewed  Physical Exam .   GEN: No acute distress.   Neck: No JVD Cardiac: RRR, 2/6 pansystolic murmur at the left lower sternal border Respiratory: Clear to auscultation bilaterally. GI: Soft, nontender, non-distended  MS: No edema  Assessment & Plan .     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 Pt has diuresed well. His edema is markedly improved.  Changed to oral diuretic therapy yesterday.  Blood pressure stable in normal range.  Tachycardia has resolved.  Currently on furosemide and low-dose metoprolol.  Moderate AS and MS. TEE today. See prior notes. Informed consent obtained.  Fever - reviewed TRH notes. Blood culture data reviewed with 2 positive cultures for strep species. TEE will evaluate for endocarditis, no obvious vegetation on 2D echo imaging.  Was on cefepime, flagyl, and vanc.  ID consulted and discontinued Flagyl and cefepime, now on vancomycin alone. Hyponatremia - stable with diuresis, slight improvement Na 128--->130---> 131  today Anemia, acute on chronic, hemoglobin stable at 7.4 today.  Management per primary team  We will follow-up with patient after his TEE today, on the schedule for 1 PM.  For questions or updates, please contact [facility] Please consult [email or web address] for contact info under        Signed, Tonny Bollman, MD

## 2023-06-21 NOTE — CV Procedure (Addendum)
    TRANSESOPHAGEAL ECHOCARDIOGRAM   NAME:  Victor [name]    MRN: [id number] DOB:  [birth year]    ADMIT DATE: 06/17/2023  INDICATIONS: Bacteremia  PROCEDURE:   Informed consent was obtained prior to the procedure. The risks, benefits and alternatives for the procedure were discussed and the patient comprehended these risks.  Risks include, but are not limited to, cough, sore throat, vomiting, nausea, somnolence, esophageal and stomach trauma or perforation, bleeding, low blood pressure, aspiration, pneumonia, infection, trauma to the teeth and death.    Procedural time out performed. The oropharynx was anesthetized with topical 1% benzocaine.    Anesthesia was administered by Dr. Ace Gins.  The patient was administered 700 mg of propofol and 0 mg of lidocaine to achieve and maintain moderate conscious sedation.  The patient's heart rate, blood pressure, and oxygen saturation are monitored continuously during the procedure. The period of conscious sedation is 30 minutes, of which I was present face-to-face 100% of this time.   The transesophageal probe was inserted in the esophagus and stomach without difficulty and multiple views were obtained.   COMPLICATIONS:    There were no immediate complications.  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  Normal LV/RV function.  Full report to follow. Further management per primary team.   Gerri Spore T. Flora Lipps, MD, [facility]  [facility]  [address], [address] [place], [place] [address] [phone]  2:43 PM

## 2023-06-21 NOTE — Transfer of Care (Signed)
Immediate Anesthesia Transfer of Care Note  Patient: Victor [name]  Procedure(s) Performed: TRANSESOPHAGEAL ECHOCARDIOGRAM  Patient Location: PACU  Anesthesia Type:MAC  Level of Consciousness: drowsy  Airway & Oxygen Therapy: Patient connected to nasal cannula oxygen  Post-op Assessment: Post -op Vital signs reviewed and stable  Post vital signs: stable  Last Vitals:  Vitals Value Taken Time  BP    Temp    Pulse    Resp    SpO2      Last Pain:  Vitals:   06/21/23 1245  TempSrc: Temporal  PainSc:       Patients Stated Pain Goal: 0 (06/21/23 0015)  Complications: There were no known notable events for this encounter.

## 2023-06-21 NOTE — TOC Progression Note (Signed)
Transition of Care [facility]) - Progression Note    Patient Details  Name: Victor [name] MRN: [id number] Date of Birth: [birth year]  Transition of Care [facility]) CM/SW Contact  Kermit Balo, RN Phone Number: 06/21/2023, 11:12 AM  Clinical Narrative:     TEE will evaluate for endocarditis  today.  TOC following for d/c needs.  No f/u per PT/OT.        Expected Discharge Plan and Services                                               Social Determinants of Health (SDOH) Interventions SDOH Screenings   Food Insecurity: Food Insecurity Present (06/18/2023)  Housing: Medium Risk (06/18/2023)  Transportation Needs: Unmet Transportation Needs (06/18/2023)  Utilities: Not At Risk (06/18/2023)  Depression (PHQ2-9): Medium Risk (05/07/2023)  Tobacco Use: High Risk (06/17/2023)    Readmission Risk Interventions     No data to display

## 2023-06-22 ENCOUNTER — Inpatient Hospital Stay (HOSPITAL_COMMUNITY): Payer: Medicaid Other

## 2023-06-22 DIAGNOSIS — I10 Essential (primary) hypertension: Secondary | ICD-10-CM | POA: Diagnosis not present

## 2023-06-22 DIAGNOSIS — I2489 Other forms of acute ischemic heart disease: Secondary | ICD-10-CM

## 2023-06-22 DIAGNOSIS — Q248 Other specified congenital malformations of heart: Secondary | ICD-10-CM | POA: Diagnosis not present

## 2023-06-22 DIAGNOSIS — R509 Fever, unspecified: Secondary | ICD-10-CM | POA: Diagnosis not present

## 2023-06-22 DIAGNOSIS — R7881 Bacteremia: Secondary | ICD-10-CM | POA: Diagnosis not present

## 2023-06-22 DIAGNOSIS — I059 Rheumatic mitral valve disease, unspecified: Secondary | ICD-10-CM | POA: Diagnosis not present

## 2023-06-22 DIAGNOSIS — I340 Nonrheumatic mitral (valve) insufficiency: Secondary | ICD-10-CM | POA: Diagnosis not present

## 2023-06-22 DIAGNOSIS — I509 Heart failure, unspecified: Secondary | ICD-10-CM | POA: Diagnosis not present

## 2023-06-22 DIAGNOSIS — I5031 Acute diastolic (congestive) heart failure: Secondary | ICD-10-CM | POA: Diagnosis not present

## 2023-06-22 DIAGNOSIS — R0602 Shortness of breath: Secondary | ICD-10-CM | POA: Diagnosis not present

## 2023-06-22 DIAGNOSIS — M5416 Radiculopathy, lumbar region: Secondary | ICD-10-CM | POA: Diagnosis not present

## 2023-06-22 DIAGNOSIS — B955 Unspecified streptococcus as the cause of diseases classified elsewhere: Secondary | ICD-10-CM | POA: Diagnosis not present

## 2023-06-22 LAB — CBC WITH DIFFERENTIAL/PLATELET
Abs Immature Granulocytes: 0.03 10*3/uL (ref 0.00–0.07)
Basophils Absolute: 0 10*3/uL (ref 0.0–0.1)
Basophils Relative: 1 %
Eosinophils Absolute: 0.2 10*3/uL (ref 0.0–0.5)
Eosinophils Relative: 2 %
HCT: 23.8 % — ABNORMAL LOW (ref 39.0–52.0)
Hemoglobin: 7.6 g/dL — ABNORMAL LOW (ref 13.0–17.0)
Immature Granulocytes: 0 %
Lymphocytes Relative: 19 %
Lymphs Abs: 1.6 10*3/uL (ref 0.7–4.0)
MCH: 30.3 pg (ref 26.0–34.0)
MCHC: 31.9 g/dL (ref 30.0–36.0)
MCV: 94.8 fL (ref 80.0–100.0)
Monocytes Absolute: 0.7 10*3/uL (ref 0.1–1.0)
Monocytes Relative: 9 %
Neutro Abs: 5.7 10*3/uL (ref 1.7–7.7)
Neutrophils Relative %: 69 %
Platelets: 210 10*3/uL (ref 150–400)
RBC: 2.51 MIL/uL — ABNORMAL LOW (ref 4.22–5.81)
RDW: 15.5 % (ref 11.5–15.5)
WBC: 8.3 10*3/uL (ref 4.0–10.5)
nRBC: 0 % (ref 0.0–0.2)

## 2023-06-22 LAB — COMPREHENSIVE METABOLIC PANEL
ALT: 43 U/L (ref 0–44)
AST: 68 U/L — ABNORMAL HIGH (ref 15–41)
Albumin: 2.4 g/dL — ABNORMAL LOW (ref 3.5–5.0)
Alkaline Phosphatase: 58 U/L (ref 38–126)
Anion gap: 6 (ref 5–15)
BUN: 15 mg/dL (ref 6–20)
CO2: 24 mmol/L (ref 22–32)
Calcium: 8.7 mg/dL — ABNORMAL LOW (ref 8.9–10.3)
Chloride: 101 mmol/L (ref 98–111)
Creatinine, Ser: 0.74 mg/dL (ref 0.61–1.24)
GFR, Estimated: >60 mL/min (ref >60)
Glucose, Bld: 120 mg/dL — ABNORMAL HIGH (ref 70–99)
Potassium: 4.3 mmol/L (ref 3.5–5.1)
Sodium: 131 mmol/L — ABNORMAL LOW (ref 135–145)
Total Bilirubin: 0.5 mg/dL (ref <1.2)
Total Protein: 7.2 g/dL (ref 6.5–8.1)

## 2023-06-22 LAB — CULTURE, BLOOD (ROUTINE X 2)
Special Requests: ADEQUATE
Special Requests: ADEQUATE

## 2023-06-22 LAB — TROPONIN I (HIGH SENSITIVITY): Troponin I (High Sensitivity): 162 ng/L (ref <18)

## 2023-06-22 LAB — MAGNESIUM: Magnesium: 2.2 mg/dL (ref 1.7–2.4)

## 2023-06-22 LAB — PHOSPHORUS: Phosphorus: 4.5 mg/dL (ref 2.5–4.6)

## 2023-06-22 MED ORDER — FUROSEMIDE 40 MG PO TABS
40.0000 mg | ORAL_TABLET | Freq: Every day | ORAL | Status: DC
  Administered 2023-06-22 – 2023-06-23 (×2): 40 mg via ORAL
  Filled 2023-06-22 (×2): qty 1

## 2023-06-22 MED ORDER — METOPROLOL TARTRATE 50 MG PO TABS
50.0000 mg | ORAL_TABLET | Freq: Two times a day (BID) | ORAL | Status: DC
  Administered 2023-06-22 – 2023-07-01 (×19): 50 mg via ORAL
  Filled 2023-06-22 (×19): qty 1

## 2023-06-22 NOTE — Progress Notes (Signed)
PROGRESS NOTE    Victor [name]  XBJ:[id number] DOB: [birth year] DOA: 06/17/2023 PCP: Claiborne Rigg, NP   Brief Narrative:  HPI per Dr. Ramon Dredge   Patient is a 52 years old male with past medical history of hyperlipidemia, obesity, hypertension presented to hospital with exertional shortness of breath, bilateral lower extremity edema and pain for the last 2 weeks.  Patient also complains of shortness of breath at rest and has been having difficulty lying down in bed.  Complains of mild cough with clear sputum production and has subjective fever at home.  Patient stated that he was taking prednisone and analgesics for his back pain and had been to a spine specialist as outpatient and had an MRI done yesterday.  Complains of some shooting pain on the lower legs.  Patient stated that he has been noticing increasing swelling of the legs over the last 2 weeks or so.  Patient denies any obvious blood in the stool but had some dark stools when he was taking prednisone.  His bowels have cleared up now.  He complains of gaseous feeling heartburn as well.  Patient admits to drinking alcohol 16 ounces not every day and denies any withdrawal symptoms from it.  Orthopnea or PND.  Was taking prednisone and analgesics as outpatient and noted some dark stools.  Denies any urinary urgency, frequency or dysuria.  Has not seen further black stools.  He complains of mild chest discomfort especially after eating food with some burning sensation.  Patient has been having difficulty ambulation due to dyspnea.  Denies any dizziness, lightheadedness or syncope.   In the ED, vitals were notable for tachycardia with tachypnea and temperature was borderline at 99.5 F.  Initial labs showed hyponatremia with sodium of 127 hemoglobin low at 8.3 and platelets low at 142.  BNP was elevated at 592.  Initial troponin I was 60.  EKG showed sinus tachycardia with right bundle branch block.  Chest x-ray did not show any infiltrate.  In  the ED patient received 40 mg of IV Lasix and was considered for admission to the hospital for new onset shortness of breath leg swelling.  **Interim History Cardiology was consulted and patient is being diuresed today but cardiology is concerned given that he has his mitral stenosis which is moderate with possible systolic anterior motion.  Cardiology recommending undergoing a TEE for more definitive evaluation.  On admission he had a temperature of unclear etiology and was not septic appearing but Blood Cx showing Streptococcal Species. Blood Cx being repeated 06/20/23 and ID has been consulted and Abx just de-escalated to IV Vancomycin. Have asked Radiology to Read the outpatient MRI done a few days ago and still not done but ID felt that his MRI should be repeated and he underwent MRI of the lumbar spine with contrast yesterday.  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  Assessment and Plan:  Shortness of Breath, Dyspnea on Exertion with lower extremity swelling in the setting of Acute on Chronic Diastolic CHF Moderate AS and Moderate MS  with possible Systolic Anterior Motion Severe mitral valve regurgitation with a subaortic membrane with mitral valve chordal calcifications and systolic anterior motion -Patient seems to be thinking that this is something new but did have a history of similar complaints in 2015 and had done 2D echocardiogram 2015 with LV function of 65%.  -BNP elevated at 592.9    -Possibility of anemia causing dyspnea as  well.  -Chest x-ray was negative.   Received 40 mg of IV of Lasix in the ED and was on IV Lasix 40 mg BID but now Cardiology holding IV diuresis and changing to po Lasix today ordered after overdiuresis with his HCM physiology however because of the shortness of breath yesterday did give him a dose of IV Lasix 80 g x 1 -Strict I's and O's and daily weights  Intake/Output Summary (Last 24 hours) at 06/22/2023 1439 Last data filed at 06/22/2023 1100 Gross per 24 hour  Intake 1852.58 ml  Output 2750 ml  Net -897.42 ml   -Echocardiogram done and showed an LVEF of 70 to 75% but did show concern for subvalvular obstruction with peak gradient as high as 117 mmHg with Valsalva.  Patient also noted to have moderate mitral stenosis with possible SAM and moderate aortic stenosis -Cardiology recommends continuing diuresis today but recommending that he undergo TEE for more definitive evaluation and we will attempt to placee the patient on the schedule for Friday -Given his concern for HCM they are going to add low-dose beta-blocker therapy with metoprolol tartrate 12.5 mg p.o. 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   -Likely nonspecific or secondary to heart failure. -EKG without any ischemic changes.   -Added PPI with Pantoprazole 40 mg po daily -If necessary will place on Maalox. -Troponin I went from 60 -> 53 -> 63 -> 97 -> 162 -ECHO showed EF of 70-75% -Cardiology suspects that he has chest discomfort in  the setting of his heart failure given 1 dose of IV Lasix 80 yesterday   History of Hypertension -Continue to monitor closely.   -On Amlodipine and Losartan at home.  -C/w IV Hydralazine 10 mg q4hprn HBP  -Continue to Monitor BP per Protocol -Last BP reading was was a little soft at 108/71   Fever in the setting of Streptococcal Bacteremia and severe mitral valve regurgitation with mitral valve Vegetation  -Spiked a Temp of 102.8 on admission and continues to spike temperatures -Afebrile now but WBC is not elevated -WBC Trend: Recent Labs  Lab 06/17/23 2240 06/19/23 0239 06/20/23 0214 06/21/23 0224 06/22/23 0210  WBC 9.6 8.5 7.8 8.3 8.3  -Urinalysis was unremarkable -Blood Cx x2 done and showed GPC in Chains and was showing Streptococcal Species in 4/4 Cx's with Sensitivities pending; Repeat Blood Cx 06/20/23 showing NGTD at 2 Day -Respiratory virus panel via PCR is negative as well as SARS-CoV-2 and DG chest x-ray done and showed "Slightly more prominent interstitial markings at both lung bases could reflect mild interstitial edema or atypical infection." -Repeat CXR yesterday and showed no active Disease  -  Empirically placed on IV antibiotics with cefepime and IV Vancomycin and IV Metronidazole and now ID discontinued IV Metronidazole and IV Cefepime and will just continue IV Vancomycin -De-escalate Abx as needed pending further culture results -Cardiology recommends a TEE to further evaluate his valvular and myocardial disease and also rule out infective endocarditis as a source of his fevers as there was no obvious vegetations on TTE; TEE as above -Will get ID involved for further evaluation and management and defer further Abx management to them  -Given the patient has a streptococcal bacteremia with severe mitral regurgitation and mitral valve apparatus calcification of the subaortic membrane and 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    -Platelet Count Trend: Recent Labs  Lab 06/17/23 2240 06/19/23 0239 06/20/23 0214 06/21/23 0224 06/22/23 0210  PLT 142* 146* 155 170 210  -Continue to monitor for signs and symptoms of bleeding -No overt bleeding noted and repeat CBC in the a.m.  HLD -C/w Rosuvastatin 20 mg po Daily but may need to hold if AST continues to worsen  Elevated AST, slightly worsening -LFT Trend: Recent Labs  Lab 06/20/23 0214 06/21/23 0224 06/22/23 0210  AST 50* 53* 68*  ALT 33 35 43  -Continue to Monitor and Trend and if Necessary will obtain RUQ U/S and an Acute Hepatitis Panel in the AM -Repeat CMP within 1 week  PreDiabetes -HbA1c was 5.9 -Glucose Trend:  Recent Labs  Lab 06/17/23 2240 06/19/23 0241 06/20/23 0214 06/21/23 0224 06/22/23 0210  GLUCOSE 134* 133* 121* 122* 120*  -If necessary will place on Sensitive Novolog SSI AC/HS   Hyponatremia -Uncertain etiology at this time but likely in the setting of volume overload -Renal function within normal range.   -Check TSH.   -Na+ Trend: Recent Labs  Lab 06/17/23 2240 06/19/23 0241 06/20/23 0214 06/21/23 0224 06/22/23 0210  NA 127* 128* 130* 131* 131*  -Could be related to drinking beer.   -Repeat CMP in the AM   Alcohol Use Disorder -Drinks 16 ounce beer probably every day.  Denies any withdrawal symptoms.   -Placed patient on Thiamine 100 mg po Daily,  Folic Acid 1 ,g po daily and Multivitamin + Minerals 1 tab po Daily.   -Closely monitor for any withdrawal symptoms.   Low Back Pain, Lumbar Radiculopathy  -Had seen a spine specialist as outpatient and MRI of the lumbar spine was ordered on 06/16/2025.   -Completed course of prednisone.    -Currently taking Tylenol for pain.   -Will need to follow-up with orthopedics as outpatient.   -Will get PT OT evaluation while in the hospital..   -Added Oxycodone IR 5 mg po q4hprn and will continue  -Continue Gabapentin 300 mg po TID and muscle relaxants with Methocarbamol 500 mg po TID -Patient had an outpatient MRI on 06/17/2023 which has not yet resulted and I called yesterday to get it read as a Priority and still has not been read yet.  -ID repeated MRI Lumbar Spine with Contrast and showed "Reactive endplate change with endplate enhancement about the L5-S1 interspace, favored to be degenerative in nature, although possible changes of infection are difficult to exclude, and could be considered in the correct clinical setting.  If the clinical picture is equivocal for spinal infection, a short interval follow-up exam to evaluate for interval changes may be helpful for further evaluation as warranted. No other evidence for acute or active infection elsewhere within the lumbar spine. Advanced degenerative disc disease at L5-S1 with resultant mild bilateral L5 foraminal stenosis. Additional mild spondylosis elsewhere within the lumbar spine as above. No other significant stenosis or impingement." -I spoke with Verlin Dike of Neurosurgery and the neurosurgery team is evaluated the imaging and feels that no neurosurgical intervention is needed and they are just recommending continuing antibiotics but if necessary recommending getting IR to see if they can drain her biopsy but they feel it would not change management  Normocytic Anemia -Hgb/Hct Trend: Recent Labs  Lab 06/17/23 2240 06/19/23 0239 06/20/23 0214 06/21/23 0224 06/22/23 0210  HGB 8.3* 8.0* 7.6* 7.4* 7.6*  HCT 26.9* 24.9* 24.4* 24.1* 23.8*  MCV 96.1 93.6 93.5 94.9 94.8  -FOBT negative but reported Dark Stools; Continue PPI with Pantoprazole -May benefit from Iron Transfusion but will hold off given bacteremia -Hold ASA for  now as well as NSAIDs -Anemia panel was done and showed an iron level of 18, UIBC of 237, TIBC 255, saturation ratios of 7%, ferritin of 248, folate level 9.8, vitamin B12 level 540 -Continue to Monitor for S/Sx of Bleeding; no overt bleeding noted -Repeat CBC in the AM  Hypoalbuminemia -Patient's Albumin Trend: Recent Labs  Lab 06/20/23 0214 06/21/23 0224 06/22/23 0210  ALBUMIN 2.3* 2.3* 2.4*  -Continue to Monitor and Trend and repeat CMP in the AM  Overweight -Complicates overall prognosis and care -Estimated body mass index is 29.18 kg/m as calculated from the following:   Height as of this encounter: 5\' 7"  (1.702 m).   Weight as of this encounter: 84.5 kg.  -Weight Loss and Dietary Counseling given   DVT prophylaxis: SCDs Start: 06/18/23 0858    Code Status: Full Code Family Communication: No family present at bedside  Disposition Plan:  Level of care: Telemetry Cardiac Status is: Inpatient Remains inpatient appropriate because: Needs further workup and clearance by the specialists   Consultants:  Cardiology Infectious Diseases Cardiothoracic Surgery Discussed with Neurosurgery  Procedures:  TRANSTHORACIC ECHOCARDIOGRAM IMPRESSIONS     1. Left ventricular ejection fraction, by estimation, is 70 to 75%. The  left ventricle has hyperdynamic function. The LV outflow tract is  narrowed. There is a mid-cavity gradient peak 48 mmHg. There appears to be  an LV outflow peak gradient as high as  117 mmHg with Valsalva. Possible mild systolic anterior motion of the  mitral valve. The left ventricle has no regional wall motion  abnormalities. There is mild concentric left ventricular hypertrophy. Left  ventricular diastolic parameters are consistent  with Grade II diastolic dysfunction (pseudonormalization).   2. Right ventricular systolic function is normal. The right ventricular  size is normal. Tricuspid regurgitation signal is inadequate for assessing  PA pressure.    3. Left atrial size was moderately dilated.   4. The mitral valve is abnormal. The valve is moderately calcified and  restricted with calcification of the chords. Possible mild SAM but cannot  be definitive. Trivial mitral valve regurgitation. Probably moderate  mitral stenosis. The mean mitral valve  gradient is 11.0 mmHg.   5. The aortic valve was not well visualized. Aortic valve regurgitation  is not visualized. Moderate aortic valve stenosis. Aortic valve mean  gradient measures 22.0 mmHg.   6. The inferior vena cava is normal in size with <  50% respiratory  variability, suggesting right atrial pressure of 8 mmHg.   7. Cannot rule out PFO vs small ASD (possible color flow across septum).   8. This patient needs a TEE. There are a number of abnormalities that are  difficult to work out by TTE. He has a hyperdynamic left ventricle with a  mid-cavity gradient. The LVOT is also significantly narrowed, possibly  with SAM, with very high LVOT  gradient with valsalva. 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 However, the mitral valve and aortic valve themselves are  both calcified and thickened and appear to have moderate stenosis.   FINDINGS   Left Ventricle: Left ventricular ejection fraction, by estimation, is 70  to 75%. The left ventricle has hyperdynamic function. The left ventricle  has no regional wall motion abnormalities. The left ventricular internal  cavity size was normal in size.  There is mild concentric left ventricular hypertrophy. Left ventricular  diastolic parameters are consistent with Grade II diastolic dysfunction  (pseudonormalization).   Right Ventricle: The right ventricular size is normal. No increase in  right ventricular wall thickness. Right ventricular systolic function is  normal. Tricuspid regurgitation signal is inadequate for assessing PA  pressure.   Left Atrium: Left atrial size was moderately dilated.   Right Atrium: Right atrial  size was normal in size.   Pericardium: Trivial pericardial effusion is present.   Mitral Valve: The mitral valve is abnormal. There is moderate  calcification of the mitral valve leaflet(s). Moderate mitral annular  calcification. Trivial mitral valve regurgitation. Moderate mitral valve  stenosis. MV peak gradient, 19.0 mmHg. The mean  mitral valve gradient is 11.0 mmHg.   Tricuspid Valve: The tricuspid valve is normal in structure. Tricuspid  valve regurgitation is not demonstrated.   Aortic Valve: The aortic valve was not well visualized. Aortic valve  regurgitation is not visualized. Moderate aortic stenosis is present.  Aortic valve mean gradient measures 22.0 mmHg. Aortic valve peak gradient  measures 37.2 mmHg.   Pulmonic Valve: The pulmonic valve was normal in structure. Pulmonic valve  regurgitation is not visualized.   Aorta: The aortic root is normal in size and structure.   Venous: The inferior vena cava is normal in size with less than 50%  respiratory variability, suggesting right atrial pressure of 8 mmHg.   IAS/Shunts: Cannot rule out PFO vs small ASD (possible color flow across  septum).     LEFT VENTRICLE  PLAX 2D  LVIDd:         4.30 cm   Diastology  LVIDs:         2.70 cm   LV e' medial:    7.46 cm/s  LV PW:         1.20 cm   LV E/e' medial:  28.2  LV IVS:        1.00 cm   LV e' lateral:   9.48 cm/s  LVOT diam:     2.00 cm   LV E/e' lateral: 22.2  LVOT Area:     3.14 cm     RIGHT VENTRICLE             IVC  RV Basal diam:  2.60 cm     IVC diam: 2.10 cm  RV S prime:     16.00 cm/s  TAPSE (M-mode): 2.5 cm   LEFT ATRIUM             Index        RIGHT ATRIUM  Index  LA diam:        4.50 cm 2.27 cm/m   RA Area:     14.70 cm  LA Vol (A2C):   86.4 ml 43.66 ml/m  RA Volume:   36.50 ml  18.45 ml/m  LA Vol (A4C):   96.0 ml 48.51 ml/m  LA Biplane Vol: 92.2 ml 46.59 ml/m   AORTIC VALVE  AV Vmax:      305.00 cm/s  AV Vmean:     221.000 cm/s   AV VTI:       0.471 m  AV Peak Grad: 37.2 mmHg  AV Mean Grad: 22.0 mmHg    AORTA  Ao Root diam: 2.70 cm  Ao Asc diam:  2.80 cm   MITRAL VALVE  MV Area (PHT): 3.46 cm     SHUNTS  MV Peak grad:  19.0 mmHg    Systemic Diam: 2.00 cm  MV Mean grad:  11.0 mmHg  MV Vmax:       2.18 m/s  MV Vmean:      156.0 cm/s  MV Decel Time: 219 msec  MV E velocity: 210.00 cm/s  MV A velocity: 162.00 cm/s  MV E/A ratio:  1.30    TRANSESOPHAGEAL ECHOCARDIOGRAM KEY FINDINGS:   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  Normal LV/RV function.  Full report to follow. Further management per primary team.   Antimicrobials:  Anti-infectives (From admission, onward)    Start     Dose/Rate Route Frequency Ordered Stop   06/21/23 1115  cefTRIAXone (ROCEPHIN) 2 g in sodium chloride 0.9 % 100 mL IVPB        2 g 200 mL/hr over 30 Minutes Intravenous Every 24 hours 06/21/23 1017     06/19/23 1600  vancomycin (VANCOREADY) IVPB 1250 mg/250 mL  Status:  Discontinued        1,250 mg 166.7 mL/hr over 90 Minutes Intravenous Every 12 hours 06/19/23 0323 06/21/23 1017   06/19/23 1200  ceFEPIme (MAXIPIME) 2 g in sodium chloride 0.9 % 100 mL IVPB  Status:  Discontinued        2 g 200 mL/hr over 30 Minutes Intravenous Every 8 hours 06/19/23 0323 06/20/23 1156   06/19/23 0145  vancomycin (VANCOREADY) IVPB 1750 mg/350 mL        1,750 mg 175 mL/hr over 120 Minutes Intravenous  Once 06/19/23 0051 06/19/23 1044   06/19/23 0145  ceFEPIme (MAXIPIME) 2 g in sodium chloride 0.9 % 100 mL IVPB        2 g 200 mL/hr over 30 Minutes Intravenous  Once 06/19/23 0051 06/19/23 0908   06/19/23 0045  metroNIDAZOLE (FLAGYL) IVPB 500 mg  Status:  Discontinued        500 mg 100 mL/hr over 60 Minutes Intravenous Every 12 hours 06/19/23 0041 06/20/23 1156       Subjective: Seen  and examined at bedside and states that he spiked a temperature last night.  States that he did not feel as well last night but is feeling better today.  Understands that his valve needs replaced and that he has infection on his heart valve.  Cardiology has already spoken to him about the plan of care and they are going to do a cardiac MRI on Monday and a left heart catheterization on them on Tuesday.  No other concerns  or complaints this time and does not feel short of breath this morning.  Continues to have some back pain.  Objective: Vitals:   06/21/23 1923 06/22/23 0127 06/22/23 0630 06/22/23 1140  BP: 118/73 103/61 125/69 107/60  Pulse: 96 87 83 77  Resp:  19  20  Temp: 98 F (36.7 C) (!) 100.8 F (38.2 C) 98.7 F (37.1 C) 97.9 F (36.6 C)  TempSrc: Oral Oral Oral Oral  SpO2: 100% 98% 99% 97%  Weight:   84.5 kg   Height:        Intake/Output Summary (Last 24 hours) at 06/22/2023 1446 Last data filed at 06/22/2023 1100 Gross per 24 hour  Intake 1852.58 ml  Output 2750 ml  Net -897.42 ml   Filed Weights   06/20/23 0400 06/21/23 0408 06/22/23 0630  Weight: 85.7 kg 86.2 kg 84.5 kg   Examination: Physical Exam:  Constitutional: WN/WD overweight African-American male in no acute distress Respiratory: Diminished to auscultation bilaterally with some coarse breath sounds, no wheezing, rales, rhonchi or crackles. Normal respiratory effort and patient is not tachypenic. No accessory muscle use.  Unlabored breathing Cardiovascular: RRR, has a 3/6 systolic murmur.  No extremity edema Abdomen: Soft, non-tender, distended secondary to body habitus. Bowel sounds positive.  GU: Deferred. Musculoskeletal: No clubbing / cyanosis of digits/nails. No joint deformity upper and lower extremities.  Skin: No rashes, lesions, ulcers on limited skin evaluation. No induration; Warm and dry.  Neurologic: CN 2-12 grossly intact with no focal deficits. Romberg sign and cerebellar reflexes not  assessed.  Psychiatric: Normal judgment and insight. Alert and oriented x 3. Normal mood and appropriate affect.   Data Reviewed: I have personally reviewed following labs and imaging studies  CBC: Recent Labs  Lab 06/17/23 2240 06/19/23 0239 06/20/23 0214 06/21/23 0224 06/22/23 0210  WBC 9.6 8.5 7.8 8.3 8.3  NEUTROABS  --   --  5.2 5.8 5.7  HGB 8.3* 8.0* 7.6* 7.4* 7.6*  HCT 26.9* 24.9* 24.4* 24.1* 23.8*  MCV 96.1 93.6 93.5 94.9 94.8  PLT 142* 146* 155 170 210   Basic Metabolic Panel: Recent Labs  Lab 06/17/23 2240 06/19/23 0241 06/20/23 0214 06/21/23 0224 06/22/23 0210  NA 127* 128* 130* 131* 131*  K 3.7 4.0 4.4 4.5 4.3  CL 97* 96* 100 102 101  CO2 22 22 22  18* 24  GLUCOSE 134* 133* 121* 122* 120*  BUN 7 7 13 12 15   CREATININE 0.63 0.87 0.74 0.77 0.74  CALCIUM 8.5* 8.4* 8.3* 8.8* 8.7*  MG  --  1.7 1.9 1.8 2.2  PHOS  --   --  3.8 3.9 4.5   GFR: Estimated Creatinine Clearance: 109.7 mL/min (by C-G formula based on SCr of 0.74 mg/dL). Liver Function Tests: Recent Labs  Lab 06/20/23 0214 06/21/23 0224 06/22/23 0210  AST 50* 53* 68*  ALT 33 35 43  ALKPHOS 48 56 58  BILITOT 0.5 0.5 0.5  PROT 6.8 6.8 7.2  ALBUMIN 2.3* 2.3* 2.4*   No results for input(s): "LIPASE", "AMYLASE" in the last 168 hours. No results for input(s): "AMMONIA" in the last 168 hours. Coagulation Profile: No results for input(s): "INR", "PROTIME" in the last 168 hours. Cardiac Enzymes: No results for input(s): "CKTOTAL", "CKMB", "CKMBINDEX", "TROPONINI" in the last 168 hours. BNP (last 3 results) No results for input(s): "PROBNP" in the last 8760 hours. HbA1C: No results for input(s): "HGBA1C" in the last 72 hours. CBG: No results for input(s): "GLUCAP" in the last 168 hours. Lipid  Profile: No results for input(s): "CHOL", "HDL", "LDLCALC", "TRIG", "CHOLHDL", "LDLDIRECT" in the last 72 hours. Thyroid Function Tests: No results for input(s): "TSH", "T4TOTAL", "FREET4", "T3FREE",  "THYROIDAB" in the last 72 hours. Anemia Panel: No results for input(s): "VITAMINB12", "FOLATE", "FERRITIN", "TIBC", "IRON", "RETICCTPCT" in the last 72 hours. Sepsis Labs: Recent Labs  Lab 06/19/23 0239  LATICACIDVEN 1.4   Recent Results (from the past 240 hour(s))  Culture, blood (Routine X 2) w Reflex to ID Panel     Status: Abnormal   Collection Time: 06/19/23  2:39 AM   Specimen: BLOOD  Result Value Ref Range Status   Specimen Description BLOOD BLOOD LEFT ARM  Final   Special Requests   Final    BOTTLES DRAWN AEROBIC AND ANAEROBIC Blood Culture adequate volume   Culture  Setup Time   Final    GRAM POSITIVE COCCI IN CHAINS IN BOTH AEROBIC AND ANAEROBIC BOTTLES CRITICAL RESULT CALLED TO, READ BACK BY AND VERIFIED WITH: PHARMD JAMES LEDFORD ON 06/19/23 @ 2313 BY DRT Performed at [facility], [address] [place], [place] [address]    Culture STREPTOCOCCUS SALIVARIUS (A)  Final   Report Status 06/22/2023 FINAL  Final   Organism ID, Bacteria STREPTOCOCCUS SALIVARIUS  Final      Susceptibility   Streptococcus salivarius - MIC*    PENICILLIN <=0.06 SENSITIVE Sensitive     CEFTRIAXONE <=0.12 SENSITIVE Sensitive     ERYTHROMYCIN <=0.12 SENSITIVE Sensitive     LEVOFLOXACIN 2 SENSITIVE Sensitive     VANCOMYCIN 0.5 SENSITIVE Sensitive     * STREPTOCOCCUS SALIVARIUS  Culture, blood (Routine X 2) w Reflex to ID Panel     Status: Abnormal   Collection Time: 06/19/23  2:41 AM   Specimen: BLOOD  Result Value Ref Range Status   Specimen Description BLOOD BLOOD RIGHT ARM  Final   Special Requests   Final    BOTTLES DRAWN AEROBIC AND ANAEROBIC Blood Culture adequate volume   Culture  Setup Time   Final    GRAM POSITIVE COCCI IN CHAINS IN BOTH AEROBIC AND ANAEROBIC BOTTLES CRITICAL RESULT CALLED TO, READ BACK BY AND VERIFIED WITH: PHARMD JAMES LEDFORD ON 06/19/23 @ 2313 BY DRT    Culture (A)  Final    STREPTOCOCCUS SALIVARIUS SUSCEPTIBILITIES PERFORMED ON PREVIOUS CULTURE  WITHIN THE LAST 5 DAYS.    Report Status 06/22/2023 FINAL  Final  Blood Culture ID Panel (Reflexed)     Status: Abnormal   Collection Time: 06/19/23  2:41 AM  Result Value Ref Range Status   Enterococcus faecalis NOT DETECTED NOT DETECTED Final   Enterococcus Faecium NOT DETECTED NOT DETECTED Final   Listeria monocytogenes NOT DETECTED NOT DETECTED Final   Staphylococcus species NOT DETECTED NOT DETECTED Final   Staphylococcus aureus (BCID) NOT DETECTED NOT DETECTED Final   Staphylococcus epidermidis NOT DETECTED NOT DETECTED Final   Staphylococcus lugdunensis NOT DETECTED NOT DETECTED Final   Streptococcus species DETECTED (A) NOT DETECTED Final    Comment: Not Enterococcus species, Streptococcus agalactiae, Streptococcus pyogenes, or Streptococcus pneumoniae. CRITICAL RESULT CALLED TO, READ BACK BY AND VERIFIED WITH: PHARMD JAMES LEDFORD ON 06/19/23 @ 2313 BY DRT    Streptococcus agalactiae NOT DETECTED NOT DETECTED Final   Streptococcus pneumoniae NOT DETECTED NOT DETECTED Final   Streptococcus pyogenes NOT DETECTED NOT DETECTED Final   A.calcoaceticus-baumannii NOT DETECTED NOT DETECTED Final   Bacteroides fragilis NOT DETECTED NOT DETECTED Final   Enterobacterales NOT DETECTED NOT DETECTED Final   Enterobacter  cloacae complex NOT DETECTED NOT DETECTED Final   Escherichia coli NOT DETECTED NOT DETECTED Final   Klebsiella aerogenes NOT DETECTED NOT DETECTED Final   Klebsiella oxytoca NOT DETECTED NOT DETECTED Final   Klebsiella pneumoniae NOT DETECTED NOT DETECTED Final   Proteus species NOT DETECTED NOT DETECTED Final   Salmonella species NOT DETECTED NOT DETECTED Final   Serratia marcescens NOT DETECTED NOT DETECTED Final   Haemophilus influenzae NOT DETECTED NOT DETECTED Final   Neisseria meningitidis NOT DETECTED NOT DETECTED Final   Pseudomonas aeruginosa NOT DETECTED NOT DETECTED Final   Stenotrophomonas maltophilia NOT DETECTED NOT DETECTED Final   Candida albicans NOT  DETECTED NOT DETECTED Final   Candida auris NOT DETECTED NOT DETECTED Final   Candida glabrata NOT DETECTED NOT DETECTED Final   Candida krusei NOT DETECTED NOT DETECTED Final   Candida parapsilosis NOT DETECTED NOT DETECTED Final   Candida tropicalis NOT DETECTED NOT DETECTED Final   Cryptococcus neoformans/gattii NOT DETECTED NOT DETECTED Final    Comment: Performed at [facility], [address] [place], [place] [address]  SARS Coronavirus 2 by RT PCR (hospital order, performed in [facility] hospital lab) *cepheid single result test* Anterior Nasal Swab     Status: None   Collection Time: 06/19/23  2:45 AM   Specimen: Anterior Nasal Swab  Result Value Ref Range Status   SARS Coronavirus 2 by RT PCR NEGATIVE NEGATIVE Final    Comment: Performed at [facility], [address], [place], [place] [address]  Respiratory (~20 pathogens) panel by PCR     Status: None   Collection Time: 06/19/23  2:45 AM   Specimen: Nasopharyngeal Swab; Respiratory  Result Value Ref Range Status   Adenovirus NOT DETECTED NOT DETECTED Final   Coronavirus 229E NOT DETECTED NOT DETECTED Final    Comment: (NOTE) The Coronavirus on the Respiratory Panel, DOES NOT test for the novel  Coronavirus (2019 nCoV)    Coronavirus HKU1 NOT DETECTED NOT DETECTED Final   Coronavirus NL63 NOT DETECTED NOT DETECTED Final   Coronavirus OC43 NOT DETECTED NOT DETECTED Final   Metapneumovirus NOT DETECTED NOT DETECTED Final   Rhinovirus / Enterovirus NOT DETECTED NOT DETECTED Final   Influenza A NOT DETECTED NOT DETECTED Final   Influenza B NOT DETECTED NOT DETECTED Final   Parainfluenza Virus 1 NOT DETECTED NOT DETECTED Final   Parainfluenza Virus 2 NOT DETECTED NOT DETECTED Final   Parainfluenza Virus 3 NOT DETECTED NOT DETECTED Final   Parainfluenza Virus 4 NOT DETECTED NOT DETECTED Final   Respiratory Syncytial Virus NOT DETECTED NOT DETECTED Final   Bordetella pertussis NOT DETECTED NOT DETECTED Final    Bordetella Parapertussis NOT DETECTED NOT DETECTED Final   Chlamydophila pneumoniae NOT DETECTED NOT DETECTED Final   Mycoplasma pneumoniae NOT DETECTED NOT DETECTED Final    Comment: Performed at [facility], [address], [place], [place] [address]  Culture, blood (Routine X 2) w Reflex to ID Panel     Status: None (Preliminary result)   Collection Time: 06/20/23  9:40 AM   Specimen: BLOOD LEFT ARM  Result Value Ref Range Status   Specimen Description BLOOD LEFT ARM  Final   Special Requests   Final    BOTTLES DRAWN AEROBIC AND ANAEROBIC Blood Culture adequate volume   Culture   Final    NO GROWTH 2 DAYS Performed at [facility], [address], [place], [place] [address]    Report Status  PENDING  Incomplete  Culture, blood (Routine X 2) w Reflex to ID Panel     Status: None (Preliminary result)   Collection Time: 06/20/23  9:49 AM   Specimen: BLOOD RIGHT HAND  Result Value Ref Range Status   Specimen Description BLOOD RIGHT HAND  Final   Special Requests   Final    BOTTLES DRAWN AEROBIC AND ANAEROBIC Blood Culture adequate volume   Culture   Final    NO GROWTH 2 DAYS Performed at [facility], [address], [place], [place] [address]    Report Status PENDING  Incomplete    Radiology Studies: DG Chest 2 View  Result Date: 06/22/2023 CLINICAL DATA:  [id number] with CHF. EXAM: CHEST - 2 VIEW COMPARISON:  Portable chest 06/20/2023 at 5:13 a.m. FINDINGS: PA Lat at 5:13 a.m. The cardiac size, vascular pattern and mediastinal configuration are normal. The lungs clear. Thoracic cage is intact with slight lower thoracic levoscoliosis. Compare: Unchanged. IMPRESSION: No active cardiopulmonary disease. Electronically Signed   By: Almira Bar M.D.   On: 06/22/2023 05:36   ECHO TEE  Result Date: 06/21/2023    TRANSESOPHOGEAL ECHO REPORT   Patient Name:   Victor [name] Date of Exam: 06/21/2023 Medical Rec #:  [id number]   Height:       67.0 in Accession #:    [id number]   Weight:       190.1 lb Date of Birth:  Apr 14, 1971    BSA:          1.979 m Patient Age:    52 years    BP:           115/69 mmHg Patient Gender: M           HR:           83 bpm. Exam Location:  Inpatient Procedure: Transesophageal Echo, Cardiac Doppler, Color Doppler, 3D Echo and            Saline Contrast Bubble Study Indications:     Endocarditis  History:         Patient has prior history of Echocardiogram examinations, most                  recent 06/18/2023. Mitral Valve Disease,                  Signs/Symptoms:Shortness of Breath; Risk Factors:Hypertension.  Sonographer:     Lucendia Herrlich RCS Referring Phys:  [id number] Ronnald Ramp O'NEAL Diagnosing Phys: Lennie Odor MD PROCEDURE: After discussion of the risks and benefits of a TEE, an informed consent was obtained from the patient. TEE procedure time was 30 minutes. The transesophogeal probe was passed without difficulty through the esophogus of the patient. Imaged were obtained with the patient in a left lateral decubitus position. Sedation performed by different physician. The patient was monitored while under deep sedation. Anesthestetic sedation was provided intravenously by Anesthesiology: 700mg  of Propofol. Image quality was excellent. The patient's vital signs; including heart rate, blood pressure, and oxygen saturation; remained stable throughout the procedure. The patient developed no complications during the procedure.  IMPRESSIONS  1. Aortic valve is trileaflet without evidence of stenosis. 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 MG 19.4 mmHG. Vmax 3.1 m/s. Peak gradient 38.1 mmHG. There is SAM of the subchordal apparatus which has calcifications. Suspect this explains the dynamic obstruction which is related to [facility] of the chordal calcification and a subaortic membrane. The aortic valve is tricuspid. Aortic  valve regurgitation is not visualized. No aortic stenosis is present.  2. Degnerative mitral valve with moderately calcified  anterior and posterior mitral valve leaflets. There is a vegetation on the PMVL that measures 0.8 cm x 0.6 cm. There is severe mitral regurgitation due to incomplete leaflet coaptation, but cannot exclude a posterior mitral valve cleft. There is also chordal calcifications with SAM and dynamic outflow obstruction associated with a subaortic membrane as described. MVA 2.89 cm2. MG 4 mmHG @ 86 bpm. The mitral valve is degenerative. Severe mitral valve regurgitation. Mild mitral stenosis. The mean mitral valve gradient is 4.0 mmHg with average heart rate of 86 bpm.  3. Left ventricular ejection fraction, by estimation, is 65 to 70%. The left ventricle has normal function. There is moderate asymmetric left ventricular hypertrophy of the basal-septal segment.  4. Right ventricular systolic function is normal. The right ventricular size is normal.  5. No left atrial/left atrial appendage thrombus was detected. The LAA emptying velocity was 83 cm/s.  6. Agitated saline contrast bubble study was negative, with no evidence of any interatrial shunt. Conclusion(s)/Recommendation(s): Findings are concerning for vegetation/infective endocarditis as detailed above. FINDINGS  Left Ventricle: Left ventricular ejection fraction, by estimation, is 65 to 70%. The left ventricle has normal function. The left ventricular internal cavity size was normal in size. There is moderate asymmetric left ventricular hypertrophy of the basal-septal segment. Right Ventricle: The right ventricular size is normal. No increase in right ventricular wall thickness. Right ventricular systolic function is normal. Left Atrium: Left atrial size was normal in size. No left atrial/left atrial appendage thrombus was detected. The LAA emptying velocity was 83 cm/s. Right Atrium: Right atrial size was normal in size. Pericardium: There is no evidence of pericardial effusion. Mitral Valve: Degnerative mitral valve with moderately calcified anterior and posterior  mitral valve leaflets. There is a vegetation on the PMVL that measures 0.8 cm x 0.6 cm. There is severe mitral regurgitation due to incomplete leaflet coaptation, but  cannot exclude a posterior mitral valve cleft. There is also chordal calcifications with SAM and dynamic outflow obstruction associated with a subaortic membrane as described. MVA 2.89 cm2. MG 4 mmHG @ 86 bpm. The mitral valve is degenerative in appearance. There is moderate calcification of the anterior and posterior mitral valve leaflet(s). Severe mitral valve regurgitation. Mild mitral valve stenosis. MV peak gradient, 6.5 mmHg. The mean mitral valve gradient is 4.0 mmHg with average heart rate of 86 bpm. Tricuspid Valve: The tricuspid valve is grossly normal. Tricuspid valve regurgitation is trivial. No evidence of tricuspid stenosis. Aortic Valve: Aortic valve is trileaflet without evidence of stenosis. 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 MG 19.4 mmHG. Vmax 3.1 m/s. Peak gradient 38.1 mmHG. There is SAM of the subchordal apparatus which has calcifications. Suspect this explains the dynamic obstruction which is related to [facility] of the chordal calcification and a subaortic membrane. The aortic valve is tricuspid. Aortic valve regurgitation is not visualized. No aortic stenosis is present. Aortic valve mean gradient measures 19.4 mmHg. Aortic valve peak gradient measures 38.1 mmHg. Pulmonic Valve: The pulmonic valve was grossly normal. Pulmonic valve regurgitation is not visualized. No evidence of pulmonic stenosis. Aorta: The aortic root and ascending aorta are structurally normal, with no evidence of dilitation. IAS/Shunts: The atrial septum is grossly normal. Agitated saline contrast was given intravenously to evaluate for intracardiac shunting. Agitated saline contrast bubble study was negative, with no evidence of any interatrial shunt. Additional Comments: Spectral Doppler performed.  AORTIC VALVE AV Vmax:      308.52 cm/s AV  Vmean:     205.507 cm/s AV VTI:       0.529 m AV Peak Grad: 38.1 mmHg AV Mean Grad: 19.4 mmHg  AORTA Ao Root diam: 3.10 cm Ao Asc diam:  3.00 cm MITRAL VALVE MV Peak grad: 6.5 mmHg MV Mean grad: 4.0 mmHg MV Vmax:      1.27 m/s MV Vmean:     91.4 cm/s Lennie Odor MD Electronically signed by Lennie Odor MD Signature Date/Time: 06/21/2023/3:51:51 PM    Final    EP STUDY  Result Date: 06/21/2023 See surgical note for result.  MR LUMBAR SPINE W CONTRAST  Result Date: 06/20/2023 CLINICAL DATA:  Initial evaluation for low back pain, evaluate for infection. EXAM: MRI LUMBAR SPINE WITH CONTRAST TECHNIQUE: Multiplanar and multiecho pulse sequences of the lumbar spine were obtained with intravenous contrast. CONTRAST:  9mL GADAVIST GADOBUTROL 1 MMOL/ML IV SOLN COMPARISON:  Prior study from 06/17/2023. FINDINGS: Segmentation: Standard. Lowest well-formed disc space labeled the L5-S1 level. Alignment: 6 mm degenerative retrolisthesis of L5 on S1. Alignment otherwise normal preservation of the normal lumbar lordosis. Vertebrae: Vertebral body height maintained without acute or chronic fracture. Bone marrow signal intensity heterogeneous and decreased on T1 weighted imaging, nonspecific, but most commonly related to anemia, smoking or obesity. No worrisome or aggressive osseous lesions. Reactive endplate change with endplate enhancement seen about the L5-S1 interspace. No significant surrounding paraspinous inflammatory changes. No fluid signal intensity within the intervening L5-S1 disc space on prior MRI. Findings are favored to be degenerative in nature. Possible early changes of infection are difficult to exclude. No other evidence for acute or active infection elsewhere within the lumbar spine. No other abnormal enhancement. Conus medullaris and cauda equina: Conus extends to the L1 level. Conus and cauda equina appear normal. Paraspinal and other soft tissues: Paraspinous soft tissues demonstrate no acute  finding. Disc levels: L1-2:  Normal interspace.  Mild facet hypertrophy.  No stenosis. L2-3: Normal interspace. Mild facet hypertrophy. No significant stenosis. L3-4: Mild far lateral disc bulging. Mild bilateral facet hypertrophy. No spinal stenosis. Foramina remain patent. L4-5: Mild disc bulge. Moderate bilateral facet hypertrophy. No spinal stenosis. Foramina remain patent. L5-S1: Advanced degenerative intervertebral disc space narrowing with retrolisthesis. Diffuse disc bulge with reactive endplate spurring. Resultant posterior disc osteophyte complex closely approximates the descending S1 nerve roots without frank impingement or displacement. Mild facet hypertrophy. No significant spinal stenosis. Mild bilateral L5 foraminal stenosis. IMPRESSION: 1. Reactive endplate change with endplate enhancement about the L5-S1 interspace, favored to be degenerative in nature, although possible changes of infection are difficult to exclude, and could be considered in the correct clinical setting. If the clinical picture is equivocal for spinal infection, a short interval follow-up exam to evaluate for interval changes may be helpful for further evaluation as warranted. 2. No other evidence for acute or active infection elsewhere within the lumbar spine. 3. Advanced degenerative disc disease at L5-S1 with resultant mild bilateral L5 foraminal stenosis. 4. Additional mild spondylosis elsewhere within the lumbar spine as above. No other significant stenosis or impingement. Electronically Signed   By: Rise Mu M.D.   On: 06/20/2023 20:59    Scheduled Meds:  docusate sodium  100 mg Oral BID   folic acid  1 mg Oral Daily   furosemide  40 mg Oral Daily   gabapentin  300 mg Oral TID   methocarbamol  500 mg Oral TID   metoprolol tartrate  50 mg Oral BID   multivitamin with minerals  1 tablet Oral Daily   pantoprazole  40 mg Oral Daily   potassium chloride  40 mEq Oral Daily   rosuvastatin  20 mg Oral Daily    sodium chloride flush  3 mL Intravenous Q12H   thiamine  100 mg Oral Daily   Continuous Infusions:  cefTRIAXone (ROCEPHIN)  IV 2 g (06/22/23 1217)    LOS: 4 days   Marguerita Merles, DO [facility] Available via Epic secure chat 7am-7pm After these hours, please refer to coverage provider listed on [email or web address] 06/22/2023, 2:46 PM

## 2023-06-22 NOTE — Progress Notes (Signed)
Cardiology Progress Note  Patient ID: Victor [name] MRN: [id number] DOB: Jun 25, 1971 Date of Encounter: 06/22/2023 Primary Cardiologist: Tonny Bollman, MD  Subjective   Chief Complaint: SOB  HPI: CP overnight.  Cardiac enzymes minimally elevated.  EKG unchanged.  No further chest pain this morning.  ROS:  All other ROS reviewed and negative. Pertinent positives noted in the HPI.     Telemetry  Overnight telemetry shows sinus rhythm 80s, which I personally reviewed.   ECG  The most recent ECG shows sinus tachycardia heart rate 106, right bundle branch block, left anterior fascicular block, no acute ischemic changes, which I personally reviewed.   Physical Exam   Vitals:   06/21/23 1743 06/21/23 1923 06/22/23 0127 06/22/23 0630  BP: 132/72 118/73 103/61 125/69  Pulse: (!) 111 96 87 83  Resp: 19  19   Temp: 98.1 F (36.7 C) 98 F (36.7 C) (!) 100.8 F (38.2 C) 98.7 F (37.1 C)  TempSrc: Oral Oral Oral Oral  SpO2: 98% 100% 98% 99%  Weight:    84.5 kg  Height:        Intake/Output Summary (Last 24 hours) at 06/22/2023 0730 Last data filed at 06/22/2023 8469 Gross per 24 hour  Intake 1612.58 ml  Output 2950 ml  Net -1337.42 ml       06/22/2023    6:30 AM 06/21/2023    4:08 AM 06/20/2023    4:00 AM  Last 3 Weights  Weight (lbs) 186 lb 4.6 oz 190 lb 1.6 oz 189 lb  Weight (kg) 84.5 kg 86.229 kg 85.73 kg    Body mass index is 29.18 kg/m.  General: Well nourished, well developed, in no acute distress Head: Atraumatic, normal size  Eyes: PEERLA, EOMI  Neck: Supple, no JVD Endocrine: No thryomegaly Cardiac: Normal S1, S2; RRR; 3 out of 6 harsh systolic ejection murmur, worsened with Valsalva Lungs: Clear to auscultation bilaterally, no wheezing, rhonchi or rales  Abd: Soft, nontender, no hepatomegaly  Ext: No edema, pulses 2+ Musculoskeletal: No deformities, BUE and BLE strength normal and equal Skin: Warm and dry, no rashes   Neuro: Alert and oriented to person,  place, time, and situation, CNII-XII grossly intact, no focal deficits  Psych: Normal mood and affect   Cardiac Studies  TEE 06/21/2023  1. Aortic valve is trileaflet without evidence of stenosis. 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 MG 19.4 mmHG. Vmax 3.1 m/s.  Peak gradient 38.1 mmHG. There is SAM of the subchordal apparatus which  has calcifications. Suspect this  explains the dynamic obstruction which is related to [facility] of the chordal  calcification and a subaortic membrane. The aortic valve is tricuspid.  Aortic valve regurgitation is not visualized. No aortic stenosis is  present.   2. Degnerative mitral valve with moderately calcified anterior and  posterior mitral valve leaflets. There is a vegetation on the PMVL that  measures 0.8 cm x 0.6 cm. There is severe mitral regurgitation due to  incomplete leaflet coaptation, but cannot  exclude a posterior mitral valve cleft. There is also chordal  calcifications with SAM and dynamic outflow obstruction associated with a  subaortic membrane as described. MVA 2.89 cm2. MG 4 mmHG @ 86 bpm. The  mitral valve is degenerative. Severe mitral  valve regurgitation. Mild mitral stenosis. The mean mitral valve gradient  is 4.0 mmHg with average heart rate of 86 bpm.   3. Left ventricular ejection fraction, by estimation, is 65 to 70%. The  left  ventricle has normal function. There is moderate asymmetric left  ventricular hypertrophy of the basal-septal segment.   4. Right ventricular systolic function is normal. The right ventricular  size is normal.   5. No left atrial/left atrial appendage thrombus was detected. The LAA  emptying velocity was 83 cm/s.   6. Agitated saline contrast bubble study was negative, with no evidence  of any interatrial shunt.   Patient Profile  Victor [name] is a 52 y.o. male with hypertension, obesity, hyperlipidemia who was admitted on 06/17/2023 with shortness of breath and acute diastolic heart  failure in the setting of sepsis.  Blood cultures are positive for gram-positive cocci.  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 -Currently admitted to the hospital with sepsis and Streptococcus bacteremia.  Cultures are pending.  Transesophageal echo yesterday shows mitral valve vegetation with severe mitral valve regurgitation. -He also has a subaortic membrane which could be a contact lesion due to mitral valve chordal calcifications with systolic anterior motion. -His echo is interesting.  He does not appear to have severe basal septal hypertrophy but does have mild asymmetric hypertrophy.  It is difficult to exclude hypertrophic cardiomyopathy on the images obtained. -Regardless he will need cardiac surgery.  I think it is best to get a cardiac MRI likely on Monday to evaluate him for hypertrophic cardiomyopathy.  This could explain the abnormal mitral valve and subpleural calcifications.  This could also explain subaortic membrane which can be a contact lesion in the setting of this condition.  This would be beneficial to know if he will need subaortic membrane resection but may need septal myectomy at the same time.  His mitral valve needs to be replaced regardless. -For now he is euvolemic.  Hold further IV diuresis.  I have ordered 40 of p.o. Lasix daily. -Increase metoprolol to tartrate to 50 mg twice daily to help with outflow tract obstruction. -He will need left and right heart catheterization which we will do early next week as well. -Blood culture seems to have cleared.  # Elevated troponin, demand -Suspect this is secondary to outflow tract obstruction and endocarditis.  No need for heparin at this time. -He will need a left and right heart  catheterization regardless.  # Anemia -Suspect this is secondary to endocarditis and acute illness.  Close monitoring.  No signs of bleeding. -This is making his outflow tract obstruction likely worse.      For questions or updates, please contact [facility] Please consult [email or web address] for contact info under        Signed, Gerri Spore T. Flora Lipps, MD, [facility] [place]  [facility]  06/22/2023 7:30 AM

## 2023-06-22 NOTE — Consult Note (Signed)
[address]       [place] [address]             [id number]        [name] Medical Record #[id number] Date of Birth: 06/15/1971  Referring: No ref. provider found Primary Care: Claiborne Rigg, NP Primary Cardiologist:Michael Excell Seltzer, MD  Chief Complaint:    Chief Complaint  Patient presents with   Shortness of Breath    History of Present Illness:     Victor [name] 52 y.o. 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  He originally was admitted with shortness of breath and lower extremity swelling.  His blood cultures have been positive for streptococcus.    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  CTS has been consulted to assist with management.    Creatinine:  Lab Results  Component Value Date   CREATININE 0.74 06/22/2023   CREATININE 0.77 06/21/2023   CREATININE 0.74 06/20/2023     Past Medical History:  Diagnosis Date   Dizziness 10/15/2013    "SWIMMY HEAD    "   Hypertension    Hypertriglyceridemia    Obesity     Past Surgical History:  Procedure Laterality Date   INGUINAL HERNIA REPAIR Right 02/03/2021   Procedure: HERNIA REPAIR INGUINAL ADULT;  Surgeon: Diamantina Monks, MD;  Location: [facility] OR;  Service: General;  Laterality: Right;   INSERTION OF MESH Right 02/03/2021   Procedure: INSERTION OF MESH;  Surgeon: Diamantina Monks, MD;  Location: [facility] OR;  Service: General;  Laterality: Right;   NO PAST SURGERIES      Social History:  Social History   Tobacco Use  Smoking Status Never  Smokeless Tobacco Current   Types: Chew    Social History   Substance and Sexual Activity  Alcohol Use Yes   Comment: 4 12 oz beers twice/wk.     No Known Allergies    Current Facility-Administered Medications  Medication Dose Route Frequency Provider Last Rate Last Admin   acetaminophen (TYLENOL) tablet 650 mg  650 mg Oral Q6H PRN Sande Rives, MD   650 mg at 06/22/23 0225   cefTRIAXone (ROCEPHIN) 2 g in sodium chloride 0.9 % 100 mL IVPB  2 g Intravenous Q24H Sande Rives, MD   Stopped at 06/21/23 1234   docusate sodium (COLACE) capsule 100 mg  100 mg Oral BID Sande Rives, MD   100 mg at 06/22/23 8657   folic acid (FOLVITE) tablet 1 mg  1 mg Oral Daily Sande Rives, MD   1 mg at 06/22/23 0935   furosemide (LASIX) tablet 40 mg  40 mg Oral Daily Sande Rives, MD   40 mg at 06/22/23 0935   gabapentin (NEURONTIN) capsule 300 mg  300 mg Oral TID Sande Rives, MD   300 mg at 06/22/23 0935   guaiFENesin (ROBITUSSIN) 100 MG/5ML liquid 5 mL  5 mL Oral Q4H PRN Sande Rives, MD   5 mL at 06/18/23 2119   hydrALAZINE (APRESOLINE) injection 10 mg  10 mg Intravenous Q4H PRN Sande Rives, MD       melatonin tablet 3 mg  3 mg Oral QHS PRN Sande Rives, MD   3 mg at 06/21/23 2223   menthol-cetylpyridinium (CEPACOL) lozenge 3 mg  1 lozenge Oral PRN O'Neal, Ronnald Ramp, MD  methocarbamol (ROBAXIN) tablet 500 mg  500 mg Oral TID Sande Rives, MD   500 mg at 06/22/23 1610   metoprolol tartrate (LOPRESSOR) tablet 50 mg  50 mg Oral BID Sande Rives, MD   50 mg at 06/22/23 0935   multivitamin with minerals tablet 1 tablet  1 tablet Oral Daily Sande Rives, MD   1 tablet at 06/22/23 0936   ondansetron (ZOFRAN) tablet 4 mg  4 mg Oral Q6H PRN Sande Rives, MD       Or   ondansetron [facility]) injection 4 mg  4 mg Intravenous Q6H PRN Sande Rives, MD   4 mg at 06/21/23 1039   oxyCODONE (Oxy IR/ROXICODONE) immediate release tablet 5 mg  5 mg Oral Q4H PRN Sande Rives, MD   5 mg at 06/18/23 0929   pantoprazole (PROTONIX) EC tablet 40 mg  40 mg Oral Daily Sande Rives, MD   40 mg at 06/22/23 9604   potassium chloride SA (KLOR-CON M) CR tablet 40 mEq  40 mEq Oral Daily Sande Rives, MD   40 mEq at 06/22/23  5409   rosuvastatin (CRESTOR) tablet 20 mg  20 mg Oral Daily Sande Rives, MD   20 mg at 06/22/23 0939   sodium chloride flush (NS) 0.9 % injection 3 mL  3 mL Intravenous Q12H Sande Rives, MD   3 mL at 06/22/23 0939   sodium chloride flush (NS) 0.9 % injection 3 mL  3 mL Intravenous PRN Sande Rives, MD       thiamine (VITAMIN B1) tablet 100 mg  100 mg Oral Daily O'Neal, Ronnald Ramp, MD   100 mg at 06/22/23 8119    Medications Prior to Admission  Medication Sig Dispense Refill Last Dose   acetaminophen (TYLENOL) 500 MG tablet Take 1,000 mg by mouth in the morning, at noon, and at bedtime.   06/17/2023 at pm   aspirin 81 MG EC tablet Take 1 tablet (81 mg total) by mouth daily. 30 tablet 4 Past Week   meloxicam (MOBIC) 15 MG tablet Take 1 tablet (15 mg total) by mouth daily. (Patient not taking: Reported on 06/18/2023) 30 tablet 0 Not Taking   methocarbamol (ROBAXIN) 500 MG tablet Take 1 tablet (500 mg total) by mouth 3 (three) times daily. (Patient not taking: Reported on 06/18/2023) 90 tablet 0 Not Taking   amLODipine (NORVASC) 10 MG tablet Take 1 tablet (10 mg total) by mouth daily. (Patient not taking: Reported on 06/18/2023) 30 tablet 0 Not Taking   diclofenac (VOLTAREN) 75 MG EC tablet Take 1 tablet (75 mg total) by mouth 2 (two) times daily. (Patient not taking: Reported on 06/18/2023) 60 tablet 0 Not Taking   furosemide (LASIX) 20 MG tablet Take 1 tablet (20 mg total) by mouth daily. 3 tablet 0    gabapentin (NEURONTIN) 300 MG capsule Take 1 capsule (300 mg total) by mouth 3 (three) times daily. (Patient not taking: Reported on 06/18/2023) 90 capsule 3 Not Taking   gemfibrozil (LOPID) 600 MG tablet Take 1 tablet (600 mg total) by mouth 2 (two) times daily before a meal. (Patient not taking: Reported on 06/18/2023) 60 tablet 0 Not Taking   losartan (COZAAR) 50 MG tablet Take 1 tablet (50 mg total) by mouth daily. (Patient not taking: Reported on 06/18/2023) 30  tablet 0 Not Taking   predniSONE (DELTASONE) 50 MG tablet Take 1 tablet (50 mg total) by mouth daily with breakfast. Take until  completed. (Patient not taking: Reported on 06/18/2023) 5 tablet 0 Not Taking   rosuvastatin (CRESTOR) 20 MG tablet Take 1 tablet (20 mg total) by mouth daily. (Patient not taking: Reported on 06/18/2023) 30 tablet 0 Not Taking   tiZANidine (ZANAFLEX) 4 MG tablet Take 1 tablet (4 mg total) by mouth every 6 (six) hours as needed for muscle spasms. (Patient not taking: Reported on 06/18/2023) 30 tablet 0 Not Taking    Family History  Problem Relation Age of Onset   CAD Mother        stenting @ 106; alive @ 80   Aneurysm Mother        thoracic s/p surgery @ 82   Lung disease Mother    Other Father        unknown     Review of Systems:   Review of Systems  Constitutional: Negative.   Respiratory: Negative.    Cardiovascular: Negative.       Physical Exam: BP 125/69 (BP Location: Right Arm)   Pulse 83   Temp 98.7 F (37.1 C) (Oral)   Resp 19   Ht 5\' 7"  (1.702 m)   Wt 84.5 kg   SpO2 99%   BMI 29.18 kg/m  Physical Exam Constitutional:      General: He is not in acute distress.    Appearance: He is well-developed.  HENT:     Head: Normocephalic.     Comments: Edentulous  Cardiovascular:     Rate and Rhythm: Normal rate and regular rhythm.  Pulmonary:     Effort: Pulmonary effort is normal.  Musculoskeletal:        General: Normal range of motion.  Skin:    General: Skin is warm and dry.  Neurological:     General: No focal deficit present.     Mental Status: He is alert.       Diagnostic Studies & Laboratory data:  Echo: IMPRESSIONS     1. Aortic valve is trileaflet without evidence of stenosis. 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 MG 19.4 mmHG. Vmax 3.1 m/s.  Peak gradient 38.1 mmHG. There is SAM of the subchordal apparatus which  has calcifications. Suspect this  explains the dynamic obstruction which is  related to [facility] of the chordal  calcification and a subaortic membrane. The aortic valve is tricuspid.  Aortic valve regurgitation is not visualized. No aortic stenosis is  present.   2. Degnerative mitral valve with moderately calcified anterior and  posterior mitral valve leaflets. There is a vegetation on the PMVL that  measures 0.8 cm x 0.6 cm. There is severe mitral regurgitation due to  incomplete leaflet coaptation, but cannot  exclude a posterior mitral valve cleft. There is also chordal  calcifications with SAM and dynamic outflow obstruction associated with a  subaortic membrane as described. MVA 2.89 cm2. MG 4 mmHG @ 86 bpm. The  mitral valve is degenerative. Severe mitral  valve regurgitation. Mild mitral stenosis. The mean mitral valve gradient  is 4.0 mmHg with average heart rate of 86 bpm.   3. Left ventricular ejection fraction, by estimation, is 65 to 70%. The  left ventricle has normal function. There is moderate asymmetric left  ventricular hypertrophy of the basal-septal segment.   4. Right ventricular systolic function is normal. The right ventricular  size is normal.   5. No left atrial/left atrial appendage thrombus was detected. The LAA  emptying velocity was 83 cm/s.   6. Agitated saline  contrast bubble study was negative, with no evidence  of any interatrial shunt.    EKG: RBBB Bifascicular block  I have independently reviewed the above radiologic studies and discussed with the patient   Recent Lab Findings: Lab Results  Component Value Date   WBC 8.3 06/22/2023   HGB 7.6 (L) 06/22/2023   HCT 23.8 (L) 06/22/2023   PLT 210 06/22/2023   GLUCOSE 120 (H) 06/22/2023   CHOL 146 07/27/2020   TRIG 146 07/27/2020   HDL 48 07/27/2020   LDLCALC 73 07/27/2020   ALT 43 06/22/2023   AST 68 (H) 06/22/2023   NA 131 (L) 06/22/2023   K 4.3 06/22/2023   CL 101 06/22/2023   CREATININE 0.74 06/22/2023   BUN 15 06/22/2023   CO2 24 06/22/2023   TSH 1.613 06/18/2023    HGBA1C 5.9 (H) 06/18/2023      Assessment / Plan:   52yo male with strep bacteremia, severe MR with mitral valve apparatus calcifications, a subaortic membrane, and 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  This case will be discussed with Dr. Leafy Ro, but he will need surgery for his valve and subaortic membrane.  Given his severe MR, this likely will need to be done as an inpatient.  He should get a LHC.   A cardiac MRI will also be helpful for further evaluation of his HOCM.         Corliss Skains 06/22/2023 9:54 AM

## 2023-06-22 NOTE — Progress Notes (Signed)
TRH night cross cover note:   I was notified by RN of the patient's updated troponin level of 97, up slightly from most recent prior of 63.   Per my discussions with patient's RN, he denies any chest pain or any new symptoms at this time.   It appears that cardiology is already following. I've ordered a repeat trop to occur around 8 AM this morning.    Newton Pigg, DO Hospitalist

## 2023-06-22 NOTE — Plan of Care (Signed)

## 2023-06-22 NOTE — Progress Notes (Signed)
Mobility Specialist Progress Note:   06/22/23 1400  Mobility  Activity Ambulated with assistance in hallway  Level of Assistance  (MinG)  Assistive Device [employer] Ambulated (ft) 110 ft  Activity Response Tolerated well  Mobility Referral Yes  $Mobility charge 1 Mobility  Mobility Specialist Start Time (ACUTE ONLY) 1415  Mobility Specialist Stop Time (ACUTE ONLY) 1423  Mobility Specialist Time Calculation (min) (ACUTE ONLY) 8 min   Pre Mobility: 88 HR  During Mobility: 100 HR , 99% SpO2 Post Mobility: 87 HR ,137/71 BP  Pt received in chair, agreeable to mobility. Pt c/o slight lightheadedness and SOB during ambulation requesting to return to room. VSS. Pt returned to chair asymptomatic with call bell in reach and all needs met. Family present.   Leory Plowman  Mobility Specialist Please contact via [employer] office at [phone]

## 2023-06-23 ENCOUNTER — Other Ambulatory Visit: Payer: Self-pay

## 2023-06-23 DIAGNOSIS — I2489 Other forms of acute ischemic heart disease: Secondary | ICD-10-CM | POA: Diagnosis not present

## 2023-06-23 DIAGNOSIS — Q248 Other specified congenital malformations of heart: Secondary | ICD-10-CM | POA: Diagnosis not present

## 2023-06-23 DIAGNOSIS — R7881 Bacteremia: Secondary | ICD-10-CM | POA: Diagnosis not present

## 2023-06-23 DIAGNOSIS — M5416 Radiculopathy, lumbar region: Secondary | ICD-10-CM | POA: Diagnosis not present

## 2023-06-23 DIAGNOSIS — I5031 Acute diastolic (congestive) heart failure: Secondary | ICD-10-CM | POA: Diagnosis not present

## 2023-06-23 DIAGNOSIS — I10 Essential (primary) hypertension: Secondary | ICD-10-CM | POA: Diagnosis not present

## 2023-06-23 DIAGNOSIS — R0602 Shortness of breath: Secondary | ICD-10-CM | POA: Diagnosis not present

## 2023-06-23 DIAGNOSIS — I059 Rheumatic mitral valve disease, unspecified: Secondary | ICD-10-CM | POA: Diagnosis not present

## 2023-06-23 DIAGNOSIS — Z419 Encounter for procedure for purposes other than remedying health state, unspecified: Secondary | ICD-10-CM | POA: Diagnosis not present

## 2023-06-23 DIAGNOSIS — I340 Nonrheumatic mitral (valve) insufficiency: Secondary | ICD-10-CM | POA: Diagnosis not present

## 2023-06-23 DIAGNOSIS — B955 Unspecified streptococcus as the cause of diseases classified elsewhere: Secondary | ICD-10-CM | POA: Diagnosis not present

## 2023-06-23 LAB — CBC WITH DIFFERENTIAL/PLATELET
Abs Immature Granulocytes: 0.05 10*3/uL (ref 0.00–0.07)
Basophils Absolute: 0 10*3/uL (ref 0.0–0.1)
Basophils Relative: 0 %
Eosinophils Absolute: 0.2 10*3/uL (ref 0.0–0.5)
Eosinophils Relative: 2 %
HCT: 24 % — ABNORMAL LOW (ref 39.0–52.0)
Hemoglobin: 7.5 g/dL — ABNORMAL LOW (ref 13.0–17.0)
Immature Granulocytes: 1 %
Lymphocytes Relative: 21 %
Lymphs Abs: 2 10*3/uL (ref 0.7–4.0)
MCH: 29.3 pg (ref 26.0–34.0)
MCHC: 31.3 g/dL (ref 30.0–36.0)
MCV: 93.8 fL (ref 80.0–100.0)
Monocytes Absolute: 0.7 10*3/uL (ref 0.1–1.0)
Monocytes Relative: 7 %
Neutro Abs: 6.5 10*3/uL (ref 1.7–7.7)
Neutrophils Relative %: 69 %
Platelets: 226 10*3/uL (ref 150–400)
RBC: 2.56 MIL/uL — ABNORMAL LOW (ref 4.22–5.81)
RDW: 15.7 % — ABNORMAL HIGH (ref 11.5–15.5)
WBC: 9.5 10*3/uL (ref 4.0–10.5)
nRBC: 0 % (ref 0.0–0.2)

## 2023-06-23 LAB — COMPREHENSIVE METABOLIC PANEL
ALT: 44 U/L (ref 0–44)
AST: 63 U/L — ABNORMAL HIGH (ref 15–41)
Albumin: 2.5 g/dL — ABNORMAL LOW (ref 3.5–5.0)
Alkaline Phosphatase: 55 U/L (ref 38–126)
Anion gap: 10 (ref 5–15)
BUN: 15 mg/dL (ref 6–20)
CO2: 21 mmol/L — ABNORMAL LOW (ref 22–32)
Calcium: 8.9 mg/dL (ref 8.9–10.3)
Chloride: 101 mmol/L (ref 98–111)
Creatinine, Ser: 0.79 mg/dL (ref 0.61–1.24)
GFR, Estimated: >60 mL/min (ref >60)
Glucose, Bld: 121 mg/dL — ABNORMAL HIGH (ref 70–99)
Potassium: 4.5 mmol/L (ref 3.5–5.1)
Sodium: 132 mmol/L — ABNORMAL LOW (ref 135–145)
Total Bilirubin: 0.5 mg/dL (ref <1.2)
Total Protein: 7.4 g/dL (ref 6.5–8.1)

## 2023-06-23 LAB — PHOSPHORUS: Phosphorus: 3.8 mg/dL (ref 2.5–4.6)

## 2023-06-23 LAB — MAGNESIUM: Magnesium: 2 mg/dL (ref 1.7–2.4)

## 2023-06-23 MED ORDER — ASPIRIN 81 MG PO CHEW
81.0000 mg | CHEWABLE_TABLET | ORAL | Status: AC
Start: 2023-06-24 — End: 2023-06-25
  Administered 2023-06-24: 81 mg via ORAL
  Filled 2023-06-23: qty 1

## 2023-06-23 MED ORDER — SODIUM CHLORIDE 0.9% FLUSH
10.0000 mL | Freq: Two times a day (BID) | INTRAVENOUS | Status: DC
  Administered 2023-06-23: 10 mL via INTRAVENOUS

## 2023-06-23 NOTE — Progress Notes (Signed)
PROGRESS NOTE    Victor [name]  NFA:[id number] DOB: 12/20/1970 DOA: 06/17/2023 PCP: Claiborne Rigg, NP   Brief Narrative:  HPI per Dr. Ramon Dredge   Patient is a 52 years old male with past medical history of hyperlipidemia, obesity, hypertension presented to hospital with exertional shortness of breath, bilateral lower extremity edema and pain for the last 2 weeks.  Patient also complains of shortness of breath at rest and has been having difficulty lying down in bed.  Complains of mild cough with clear sputum production and has subjective fever at home.  Patient stated that he was taking prednisone and analgesics for his back pain and had been to a spine specialist as outpatient and had an MRI done yesterday.  Complains of some shooting pain on the lower legs.  Patient stated that he has been noticing increasing swelling of the legs over the last 2 weeks or so.  Patient denies any obvious blood in the stool but had some dark stools when he was taking prednisone.  His bowels have cleared up now.  He complains of gaseous feeling heartburn as well.  Patient admits to drinking alcohol 16 ounces not every day and denies any withdrawal symptoms from it.  Orthopnea or PND.  Was taking prednisone and analgesics as outpatient and noted some dark stools.  Denies any urinary urgency, frequency or dysuria.  Has not seen further black stools.  He complains of mild chest discomfort especially after eating food with some burning sensation.  Patient has been having difficulty ambulation due to dyspnea.  Denies any dizziness, lightheadedness or syncope.   In the ED, vitals were notable for tachycardia with tachypnea and temperature was borderline at 99.5 F.  Initial labs showed hyponatremia with sodium of 127 hemoglobin low at 8.3 and platelets low at 142.  BNP was elevated at 592.  Initial troponin I was 60.  EKG showed sinus tachycardia with right bundle branch block.  Chest x-ray did not show any infiltrate.  In  the ED patient received 40 mg of IV Lasix and was considered for admission to the hospital for new onset shortness of breath leg swelling.  **Interim History Cardiology was consulted and patient is being diuresed today but cardiology is concerned given that he has his mitral stenosis which is moderate with possible systolic anterior motion.  Cardiology recommending undergoing a TEE for more definitive evaluation.  On admission he had a temperature of unclear etiology and was not septic appearing but Blood Cx showing Streptococcal Species. Blood Cx being repeated 06/20/23 and ID has been consulted and Abx just de-escalated to IV Vancomycin. Have asked Radiology to Read the outpatient MRI done a few days ago and still not done but ID felt that his MRI should be repeated and he underwent MRI of the lumbar spine with contrast yesterday.  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   Assessment and Plan:  Shortness of Breath, Dyspnea on Exertion with lower extremity swelling in the setting of Acute  on Chronic Diastolic CHF Moderate AS and Moderate MS with possible Systolic Anterior Motion Severe mitral valve regurgitation with a subaortic membrane with mitral valve chordal calcifications and systolic anterior motion -Patient seems to be thinking that this is something new but did have a history of similar complaints in 2015 and had done 2D echocardiogram 2015 with LV function of 65%.  -BNP elevated at 592.9     -Possibility of anemia causing dyspnea as well.  -Chest x-ray was negative.   Received 40 mg of IV of Lasix in the ED and was on IV Lasix 40 mg BID but now Cardiology holding IV diuresis and changing to po Lasix today ordered after overdiuresis with his HCM physiology however because of the shortness of breath the day before yesterday did give him a dose of IV Lasix 80 g x 1; Currently Cardiology recommending continuing po Lasix 40 mg Daily  -Strict I's and O's and daily weights  Intake/Output Summary (Last 24 hours) at 06/23/2023 1328 Last data filed at 06/23/2023 1316 Gross per 24 hour  Intake 1257 ml  Output 1675 ml  Net -418 ml   -Echocardiogram done and showed an LVEF of 70 to 75% but did show concern for subvalvular obstruction with peak gradient as high as 117 mmHg with Valsalva.  Patient also noted to have moderate mitral stenosis with possible SAM and moderate aortic stenosis -Cardiology recommends continuing diuresis today but recommending that he undergo TEE for more definitive evaluation and we will attempt to placee the patient on the schedule for Friday -Given his concern for HCM they are going to add low-dose beta-blocker therapy with metoprolol tartrate 12.5 mg p.o. twice daily but this was uptitrated to 50 mg po BID and being continued. 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   -Likely nonspecific or secondary  to heart failure. -EKG without any ischemic changes.   -Added PPI with Pantoprazole 40 mg po daily -If necessary will place on Maalox. -Troponin I went from 60 -> 53 -> 63 -> 97 -> 162 -ECHO showed EF of 70-75% -Cardiology suspects that he has chest discomfort in the setting of his heart failure and recommending continuing po Diuresis    History of Hypertension -Continue to monitor closely.   -On Amlodipine and Losartan at home which have been held. -Cardiology added po Metoprolol Tartrate 50 mg BID -C/w IV Hydralazine 10 mg q4hprn HBP  -Continue to Monitor BP per Protocol -Last BP reading was 121/75   Fever in the setting of Streptococcal Bacteremia and severe mitral valve regurgitation with mitral valve Vegetation  -Spiked a Temp of 102.8 on admission and continues to spike temperatures -Afebrile now but WBC is not elevated -WBC Trend: Recent Labs  Lab 06/17/23 2240 06/19/23 0239 06/20/23 0214 06/21/23 0224 06/22/23 0210 06/23/23 0230  WBC 9.6 8.5 7.8 8.3 8.3 9.5  -Urinalysis was unremarkable -Blood Cx x2 done and showed GPC in Chains and was showing Streptococcal Species  in 4/4 Cx's with Sensitivities pending; Repeat Blood Cx 06/20/23 showing NGTD at 3 Days -Respiratory virus panel via PCR is negative as well as SARS-CoV-2 and DG chest x-ray done and showed "Slightly more prominent interstitial markings at both lung bases could reflect mild interstitial edema or atypical infection." -Repeat CXR yesterday and showed no active Disease  -Empirically placed on IV antibiotics with cefepime and IV Vancomycin and IV Metronidazole and now ID discontinued IV Metronidazole and IV Cefepime and will just continue IV Vancomycin -De-escalate Abx as needed pending further culture results -Cardiology recommends a TEE to further evaluate his valvular and myocardial disease and also rule out infective endocarditis as a source of his fevers as there was no obvious vegetations on TTE; TEE as  above -Will get ID involved for further evaluation and management and defer further Abx management to them  -Given the patient has a streptococcal bacteremia with severe mitral regurgitation and mitral valve apparatus calcification of the subaortic membrane and 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    -Platelet Count Trend: Recent Labs  Lab 06/17/23 2240 06/19/23 0239 06/20/23 0214 06/21/23 0224 06/22/23 0210 06/23/23 0230  PLT 142* 146* 155 170 210 226  -Continue to monitor for signs and symptoms of bleeding -No overt bleeding noted and repeat CBC in the a.m.  Metabolic Acidosis -Mild. CO2 is now 21, AG is 10, and Chloride Level is 101 -Continue to Monitor and Trend and repeat CMP in the AM  HLD -C/w Rosuvastatin 20 mg po Daily but may need to hold if AST continues to worsen  Elevated AST -LFT Trend: Recent Labs  Lab 06/20/23 0214 06/21/23 0224 06/22/23 0210 06/23/23 0230  AST 50* 53* 68* 63*  ALT 33 35 43 44  -Continue to Monitor and Trend and if Necessary will obtain RUQ U/S and an Acute Hepatitis Panel in the AM -Repeat CMP within 1 week  Hyponatremia -Uncertain etiology at this time but likely in the setting of volume overload -Renal function within normal range.   -Checked TSH 1.163.   -Na+ Trend: Recent Labs  Lab 06/17/23 2240 06/19/23 0241 06/20/23 0214 06/21/23 0224 06/22/23 0210 06/23/23 0230  NA 127* 128* 130* 131* 131* 132*  -Could be related to drinking beer.   -Repeat CMP in the AM   Alcohol Use Disorder -Drinks 16 ounce beer probably every day.  Denies any withdrawal symptoms.   -Placed patient on Thiamine 100 mg po Daily,   Folic Acid 1 ,g po daily and Multivitamin + Minerals 1 tab po Daily.   -Closely monitor for any withdrawal symptoms.  Prediabetes -HbA1c was 5.9 -Continue to Monitor CBGs and Glucose per protocol -CBG Trend: No results for input(s): "GLUCAP" in the last 720 hours. -Glucose Trend:  Recent Labs  Lab 06/17/23 2240 06/19/23 0241 06/20/23 0214 06/21/23 0224 06/22/23 0210 06/23/23 0230  GLUCOSE 134* 133* 121* 122* 120* 121*  -If necessary will place on Sensitive Novolog SSI AC/HS   Low Back Pain, Lumbar Radiculopathy  -Had seen a spine specialist as outpatient and MRI of the lumbar spine was ordered on 06/16/2025.   -Completed course of prednisone.   -Currently taking Tylenol for pain.   -Will need to follow-up  with orthopedics as outpatient.   -Will get PT OT evaluation while in the hospital..   -Added Oxycodone IR 5 mg po q4hprn and will continue  -Continue Gabapentin 300 mg po TID and muscle relaxants with Methocarbamol 500 mg po TID -Patient had an outpatient MRI on 06/17/2023 which has not yet resulted and I called yesterday to get it read as a Priority and still has not been read yet.  -ID repeated MRI Lumbar Spine with Contrast and showed "Reactive endplate change with endplate enhancement about the L5-S1 interspace, favored to be degenerative in nature, although possible changes of infection are difficult to exclude, and could be considered in the correct clinical setting. If the clinical picture is equivocal for spinal infection, a short interval follow-up exam to evaluate for interval changes may be helpful for further evaluation as warranted. No other evidence for acute or active infection elsewhere within the lumbar spine. Advanced degenerative disc disease at L5-S1 with resultant mild bilateral L5 foraminal stenosis. Additional mild spondylosis elsewhere within the lumbar spine as above. No other significant stenosis or impingement." -I spoke with Verlin Dike of Neurosurgery  and the neurosurgery team is evaluated the imaging and feels that no neurosurgical intervention is needed and they are just recommending continuing antibiotics but if necessary recommending getting IR to see if they can drain her biopsy but they feel it would not change management  Normocytic Anemia -Hgb/Hct Trend: Recent Labs  Lab 06/17/23 2240 06/19/23 0239 06/20/23 0214 06/21/23 0224 06/22/23 0210 06/23/23 0230  HGB 8.3* 8.0* 7.6* 7.4* 7.6* 7.5*  HCT 26.9* 24.9* 24.4* 24.1* 23.8* 24.0*  MCV 96.1 93.6 93.5 94.9 94.8 93.8  -FOBT negative but reported Dark Stools; Continue PPI with Pantoprazole -May benefit from Iron Transfusion but will hold off given bacteremia -Hold ASA for now as well as NSAIDs -Anemia panel was done and showed an iron level of 18, UIBC of 237, TIBC 255, saturation ratios of 7%, ferritin of 248, folate level 9.8, vitamin B12 level 540 -Continue to Monitor for S/Sx of Bleeding; no overt bleeding noted -Repeat CBC in the AM  Hypoalbuminemia -Patient's Albumin Trend: Recent Labs  Lab 06/20/23 0214 06/21/23 0224 06/22/23 0210 06/23/23 0230  ALBUMIN 2.3* 2.3* 2.4* 2.5*  -Continue to Monitor and Trend and repeat CMP in the AM  Overweight -Complicates overall prognosis and care -Estimated body mass index is 29.26 kg/m as calculated from the following:   Height as of this encounter: 5\' 7"  (1.702 m).   Weight as of this encounter: 84.7 kg.  -Weight Loss and Dietary Counseling given   DVT prophylaxis: SCDs Start: 06/18/23 0858    Code Status: Full Code Family Communication: No family present at bedside  Disposition Plan:  Level of care: Telemetry Cardiac Status is: Inpatient Remains inpatient appropriate because: Needs further clinical improvement and clearance by the specialists   Consultants:  Cardiology Infectious Diseases Cardiothoracic Surgery Discussed with Neurosurgery  Procedures:  TRANSTHORACIC ECHOCARDIOGRAM IMPRESSIONS     1. Left  ventricular ejection fraction, by estimation, is 70 to 75%. The  left ventricle has hyperdynamic function. The LV outflow tract is  narrowed. There is a mid-cavity gradient peak 48 mmHg. There appears to be  an LV outflow peak gradient as high as  117 mmHg with Valsalva. Possible mild systolic anterior motion of the  mitral valve. The left ventricle has no regional wall motion  abnormalities. There is mild concentric left ventricular hypertrophy. Left  ventricular diastolic parameters are consistent  with Grade II  diastolic dysfunction (pseudonormalization).   2. Right ventricular systolic function is normal. The right ventricular  size is normal. Tricuspid regurgitation signal is inadequate for assessing  PA pressure.   3. Left atrial size was moderately dilated.   4. The mitral valve is abnormal. The valve is moderately calcified and  restricted with calcification of the chords. Possible mild SAM but cannot  be definitive. Trivial mitral valve regurgitation. Probably moderate  mitral stenosis. The mean mitral valve  gradient is 11.0 mmHg.   5. The aortic valve was not well visualized. Aortic valve regurgitation  is not visualized. Moderate aortic valve stenosis. Aortic valve mean  gradient measures 22.0 mmHg.   6. The inferior vena cava is normal in size with <50% respiratory  variability, suggesting right atrial pressure of 8 mmHg.   7. Cannot rule out PFO vs small ASD (possible color flow across septum).   8. This patient needs a TEE. There are a number of abnormalities that are  difficult to work out by TTE. He has a hyperdynamic left ventricle with a  mid-cavity gradient. The LVOT is also significantly narrowed, possibly  with SAM, with very high LVOT  gradient with valsalva. 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 However, the mitral valve and aortic valve themselves are  both calcified and thickened and appear to have moderate stenosis.   FINDINGS   Left  Ventricle: Left ventricular ejection fraction, by estimation, is 70  to 75%. The left ventricle has hyperdynamic function. The left ventricle  has no regional wall motion abnormalities. The left ventricular internal  cavity size was normal in size.  There is mild concentric left ventricular hypertrophy. Left ventricular  diastolic parameters are consistent with Grade II diastolic dysfunction  (pseudonormalization).   Right Ventricle: The right ventricular size is normal. No increase in  right ventricular wall thickness. Right ventricular systolic function is  normal. Tricuspid regurgitation signal is inadequate for assessing PA  pressure.   Left Atrium: Left atrial size was moderately dilated.   Right Atrium: Right atrial size was normal in size.   Pericardium: Trivial pericardial effusion is present.   Mitral Valve: The mitral valve is abnormal. There is moderate  calcification of the mitral valve leaflet(s). Moderate mitral annular  calcification. Trivial mitral valve regurgitation. Moderate mitral valve  stenosis. MV peak gradient, 19.0 mmHg. The mean  mitral valve gradient is 11.0 mmHg.   Tricuspid Valve: The tricuspid valve is normal in structure. Tricuspid  valve regurgitation is not demonstrated.   Aortic Valve: The aortic valve was not well visualized. Aortic valve  regurgitation is not visualized. Moderate aortic stenosis is present.  Aortic valve mean gradient measures 22.0 mmHg. Aortic valve peak gradient  measures 37.2 mmHg.   Pulmonic Valve: The pulmonic valve was normal in structure. Pulmonic valve  regurgitation is not visualized.   Aorta: The aortic root is normal in size and structure.   Venous: The inferior vena cava is normal in size with less than 50%  respiratory variability, suggesting right atrial pressure of 8 mmHg.   IAS/Shunts: Cannot rule out PFO vs small ASD (possible color flow across  septum).     LEFT VENTRICLE  PLAX 2D  LVIDd:          4.30 cm   Diastology  LVIDs:         2.70 cm   LV e' medial:    7.46 cm/s  LV PW:         1.20 cm  LV E/e' medial:  28.2  LV IVS:        1.00 cm   LV e' lateral:   9.48 cm/s  LVOT diam:     2.00 cm   LV E/e' lateral: 22.2  LVOT Area:     3.14 cm     RIGHT VENTRICLE             IVC  RV Basal diam:  2.60 cm     IVC diam: 2.10 cm  RV S prime:     16.00 cm/s  TAPSE (M-mode): 2.5 cm   LEFT ATRIUM             Index        RIGHT ATRIUM           Index  LA diam:        4.50 cm 2.27 cm/m   RA Area:     14.70 cm  LA Vol (A2C):   86.4 ml 43.66 ml/m  RA Volume:   36.50 ml  18.45 ml/m  LA Vol (A4C):   96.0 ml 48.51 ml/m  LA Biplane Vol: 92.2 ml 46.59 ml/m   AORTIC VALVE  AV Vmax:      305.00 cm/s  AV Vmean:     221.000 cm/s  AV VTI:       0.471 m  AV Peak Grad: 37.2 mmHg  AV Mean Grad: 22.0 mmHg    AORTA  Ao Root diam: 2.70 cm  Ao Asc diam:  2.80 cm   MITRAL VALVE  MV Area (PHT): 3.46 cm     SHUNTS  MV Peak grad:  19.0 mmHg    Systemic Diam: 2.00 cm  MV Mean grad:  11.0 mmHg  MV Vmax:       2.18 m/s  MV Vmean:      156.0 cm/s  MV Decel Time: 219 msec  MV E velocity: 210.00 cm/s  MV A velocity: 162.00 cm/s  MV E/A ratio:  1.30    TRANSESOPHAGEAL ECHOCARDIOGRAM KEY FINDINGS:   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  Normal LV/RV function.  Full report to follow. Further management per primary team.    Antimicrobials:  Anti-infectives (From admission, onward)    Start     Dose/Rate Route Frequency Ordered Stop   06/21/23 1115  cefTRIAXone (ROCEPHIN) 2 g in sodium chloride 0.9 % 100 mL IVPB        2 g 200 mL/hr over 30 Minutes Intravenous Every 24 hours 06/21/23 1017     06/19/23 1600  vancomycin (VANCOREADY) IVPB 1250 mg/250 mL  Status:  Discontinued        1,250 mg 166.7 mL/hr over 90 Minutes Intravenous Every  12 hours 06/19/23 0323 06/21/23 1017   06/19/23 1200  ceFEPIme (MAXIPIME) 2 g in sodium chloride 0.9 % 100 mL IVPB  Status:  Discontinued        2 g 200 mL/hr over 30 Minutes Intravenous Every 8 hours 06/19/23 0323 06/20/23 1156   06/19/23 0145  vancomycin (VANCOREADY) IVPB 1750 mg/350 mL        1,750 mg 175 mL/hr over 120 Minutes Intravenous  Once 06/19/23 0051 06/19/23 1044   06/19/23 0145  ceFEPIme (MAXIPIME) 2 g in sodium chloride 0.9 % 100 mL IVPB        2 g 200 mL/hr  over 30 Minutes Intravenous  Once 06/19/23 0051 06/19/23 0908   06/19/23 0045  metroNIDAZOLE (FLAGYL) IVPB 500 mg  Status:  Discontinued        500 mg 100 mL/hr over 60 Minutes Intravenous Every 12 hours 06/19/23 0041 06/20/23 1156       Subjective: Seen and examined at bedside and he states that he was short of breath this morning but is doing better now.  Understands that he will be going for cardiac MRI and hopefully catheterization in the morning.  No chest pain or shortness of breath now.  No lightheadedness or dizziness.  Feels good and just finished eating lunch  Objective: Vitals:   06/22/23 2345 06/23/23 0608 06/23/23 0822 06/23/23 1022  BP: 112/62 120/72 115/62 121/75  Pulse: 93 95 89 94  Resp: 20 20 20    Temp: 100.2 F (37.9 C) 98.4 F (36.9 C) 98.8 F (37.1 C) 98.5 F (36.9 C)  TempSrc: Oral Oral Oral Oral  SpO2: 99% 99%  97%  Weight:  84.7 kg    Height:        Intake/Output Summary (Last 24 hours) at 06/23/2023 1336 Last data filed at 06/23/2023 1316 Gross per 24 hour  Intake 1257 ml  Output 1675 ml  Net -418 ml   Filed Weights   06/21/23 0408 06/22/23 0630 06/23/23 1610  Weight: 86.2 kg 84.5 kg 84.7 kg   Examination: Physical Exam:  Constitutional: WN/WD overweight African-American male in no acute distress Respiratory: Diminished to auscultation bilaterally, no wheezing, rales, rhonchi or crackles. Normal respiratory effort and patient is not tachypenic. No accessory muscle use.   Unlabored breathing Cardiovascular: RRR, has a 3 out of 6 systolic murmur and no appreciable extremity edema Abdomen: Soft, non-tender, distended secondary to body habitus. Bowel sounds positive.  GU: Deferred. Musculoskeletal: No clubbing / cyanosis of digits/nails. No joint deformity upper and lower extremities. Skin: No rashes, lesions, ulcers on limited skin evaluation. No induration; Warm and dry.  Neurologic: CN 2-12 grossly intact with no focal deficits. Romberg sign and cerebellar reflexes not assessed.  Psychiatric: Normal judgment and insight. Alert and oriented x 3. Normal mood and appropriate affect.   Data Reviewed: I have personally reviewed following labs and imaging studies  CBC: Recent Labs  Lab 06/19/23 0239 06/20/23 0214 06/21/23 0224 06/22/23 0210 06/23/23 0230  WBC 8.5 7.8 8.3 8.3 9.5  NEUTROABS  --  5.2 5.8 5.7 6.5  HGB 8.0* 7.6* 7.4* 7.6* 7.5*  HCT 24.9* 24.4* 24.1* 23.8* 24.0*  MCV 93.6 93.5 94.9 94.8 93.8  PLT 146* 155 170 210 226   Basic Metabolic Panel: Recent Labs  Lab 06/19/23 0241 06/20/23 0214 06/21/23 0224 06/22/23 0210 06/23/23 0230  NA 128* 130* 131* 131* 132*  K 4.0 4.4 4.5 4.3 4.5  CL 96* 100 102 101 101  CO2 22 22 18* 24 21*  GLUCOSE 133* 121* 122* 120* 121*  BUN 7 13 12 15 15   CREATININE 0.87 0.74 0.77 0.74 0.79  CALCIUM 8.4* 8.3* 8.8* 8.7* 8.9  MG 1.7 1.9 1.8 2.2 2.0  PHOS  --  3.8 3.9 4.5 3.8   GFR: Estimated Creatinine Clearance: 109.7 mL/min (by C-G formula based on SCr of 0.79 mg/dL). Liver Function Tests: Recent Labs  Lab 06/20/23 0214 06/21/23 0224 06/22/23 0210 06/23/23 0230  AST 50* 53* 68* 63*  ALT 33 35 43 44  ALKPHOS 48 56 58 55  BILITOT 0.5 0.5 0.5 0.5  PROT 6.8 6.8 7.2 7.4  ALBUMIN 2.3*  2.3* 2.4* 2.5*   No results for input(s): "LIPASE", "AMYLASE" in the last 168 hours. No results for input(s): "AMMONIA" in the last 168 hours. Coagulation Profile: No results for input(s): "INR", "PROTIME" in the last  168 hours. Cardiac Enzymes: No results for input(s): "CKTOTAL", "CKMB", "CKMBINDEX", "TROPONINI" in the last 168 hours. BNP (last 3 results) No results for input(s): "PROBNP" in the last 8760 hours. HbA1C: No results for input(s): "HGBA1C" in the last 72 hours. CBG: No results for input(s): "GLUCAP" in the last 168 hours. Lipid Profile: No results for input(s): "CHOL", "HDL", "LDLCALC", "TRIG", "CHOLHDL", "LDLDIRECT" in the last 72 hours. Thyroid Function Tests: No results for input(s): "TSH", "T4TOTAL", "FREET4", "T3FREE", "THYROIDAB" in the last 72 hours. Anemia Panel: No results for input(s): "VITAMINB12", "FOLATE", "FERRITIN", "TIBC", "IRON", "RETICCTPCT" in the last 72 hours. Sepsis Labs: Recent Labs  Lab 06/19/23 0239  LATICACIDVEN 1.4   Recent Results (from the past 240 hour(s))  Culture, blood (Routine X 2) w Reflex to ID Panel     Status: Abnormal   Collection Time: 06/19/23  2:39 AM   Specimen: BLOOD  Result Value Ref Range Status   Specimen Description BLOOD BLOOD LEFT ARM  Final   Special Requests   Final    BOTTLES DRAWN AEROBIC AND ANAEROBIC Blood Culture adequate volume   Culture  Setup Time   Final    GRAM POSITIVE COCCI IN CHAINS IN BOTH AEROBIC AND ANAEROBIC BOTTLES CRITICAL RESULT CALLED TO, READ BACK BY AND VERIFIED WITH: PHARMD JAMES LEDFORD ON 06/19/23 @ 2313 BY DRT Performed at [facility], [address], [place], [place] [address]    Culture STREPTOCOCCUS SALIVARIUS (A)  Final   Report Status 06/22/2023 FINAL  Final   Organism ID, Bacteria STREPTOCOCCUS SALIVARIUS  Final      Susceptibility   Streptococcus salivarius - MIC*    PENICILLIN <=0.06 SENSITIVE Sensitive     CEFTRIAXONE <=0.12 SENSITIVE Sensitive     ERYTHROMYCIN <=0.12 SENSITIVE Sensitive     LEVOFLOXACIN 2 SENSITIVE Sensitive     VANCOMYCIN 0.5 SENSITIVE Sensitive     * STREPTOCOCCUS SALIVARIUS  Culture, blood (Routine X 2) w Reflex to ID Panel     Status: Abnormal    Collection Time: 06/19/23  2:41 AM   Specimen: BLOOD  Result Value Ref Range Status   Specimen Description BLOOD BLOOD RIGHT ARM  Final   Special Requests   Final    BOTTLES DRAWN AEROBIC AND ANAEROBIC Blood Culture adequate volume   Culture  Setup Time   Final    GRAM POSITIVE COCCI IN CHAINS IN BOTH AEROBIC AND ANAEROBIC BOTTLES CRITICAL RESULT CALLED TO, READ BACK BY AND VERIFIED WITH: PHARMD JAMES LEDFORD ON 06/19/23 @ 2313 BY DRT    Culture (A)  Final    STREPTOCOCCUS SALIVARIUS SUSCEPTIBILITIES PERFORMED ON PREVIOUS CULTURE WITHIN THE LAST 5 DAYS.    Report Status 06/22/2023 FINAL  Final  Blood Culture ID Panel (Reflexed)     Status: Abnormal   Collection Time: 06/19/23  2:41 AM  Result Value Ref Range Status   Enterococcus faecalis NOT DETECTED NOT DETECTED Final   Enterococcus Faecium NOT DETECTED NOT DETECTED Final   Listeria monocytogenes NOT DETECTED NOT DETECTED Final   Staphylococcus species NOT DETECTED NOT DETECTED Final   Staphylococcus aureus (BCID) NOT DETECTED NOT DETECTED Final   Staphylococcus epidermidis NOT DETECTED NOT DETECTED Final   Staphylococcus lugdunensis NOT DETECTED NOT DETECTED Final   Streptococcus species DETECTED (A) NOT  DETECTED Final    Comment: Not Enterococcus species, Streptococcus agalactiae, Streptococcus pyogenes, or Streptococcus pneumoniae. CRITICAL RESULT CALLED TO, READ BACK BY AND VERIFIED WITH: PHARMD JAMES LEDFORD ON 06/19/23 @ 2313 BY DRT    Streptococcus agalactiae NOT DETECTED NOT DETECTED Final   Streptococcus pneumoniae NOT DETECTED NOT DETECTED Final   Streptococcus pyogenes NOT DETECTED NOT DETECTED Final   A.calcoaceticus-baumannii NOT DETECTED NOT DETECTED Final   Bacteroides fragilis NOT DETECTED NOT DETECTED Final   Enterobacterales NOT DETECTED NOT DETECTED Final   Enterobacter cloacae complex NOT DETECTED NOT DETECTED Final   Escherichia coli NOT DETECTED NOT DETECTED Final   Klebsiella aerogenes NOT DETECTED NOT  DETECTED Final   Klebsiella oxytoca NOT DETECTED NOT DETECTED Final   Klebsiella pneumoniae NOT DETECTED NOT DETECTED Final   Proteus species NOT DETECTED NOT DETECTED Final   Salmonella species NOT DETECTED NOT DETECTED Final   Serratia marcescens NOT DETECTED NOT DETECTED Final   Haemophilus influenzae NOT DETECTED NOT DETECTED Final   Neisseria meningitidis NOT DETECTED NOT DETECTED Final   Pseudomonas aeruginosa NOT DETECTED NOT DETECTED Final   Stenotrophomonas maltophilia NOT DETECTED NOT DETECTED Final   Candida albicans NOT DETECTED NOT DETECTED Final   Candida auris NOT DETECTED NOT DETECTED Final   Candida glabrata NOT DETECTED NOT DETECTED Final   Candida krusei NOT DETECTED NOT DETECTED Final   Candida parapsilosis NOT DETECTED NOT DETECTED Final   Candida tropicalis NOT DETECTED NOT DETECTED Final   Cryptococcus neoformans/gattii NOT DETECTED NOT DETECTED Final    Comment: Performed at [facility], [address], [place], [place] [address]  SARS Coronavirus 2 by RT PCR (hospital order, performed in [facility] hospital lab) *cepheid single result test* Anterior Nasal Swab     Status: None   Collection Time: 06/19/23  2:45 AM   Specimen: Anterior Nasal Swab  Result Value Ref Range Status   SARS Coronavirus 2 by RT PCR NEGATIVE NEGATIVE Final    Comment: Performed at [facility], [address] [place], [place] [address]  Respiratory (~20 pathogens) panel by PCR     Status: None   Collection Time: 06/19/23  2:45 AM   Specimen: Nasopharyngeal Swab; Respiratory  Result Value Ref Range Status   Adenovirus NOT DETECTED NOT DETECTED Final   Coronavirus 229E NOT DETECTED NOT DETECTED Final    Comment: (NOTE) The Coronavirus on the Respiratory Panel, DOES NOT test for the novel  Coronavirus (2019 nCoV)    Coronavirus HKU1 NOT DETECTED NOT DETECTED Final   Coronavirus NL63 NOT DETECTED NOT DETECTED Final   Coronavirus OC43 NOT DETECTED NOT DETECTED Final    Metapneumovirus NOT DETECTED NOT DETECTED Final   Rhinovirus / Enterovirus NOT DETECTED NOT DETECTED Final   Influenza A NOT DETECTED NOT DETECTED Final   Influenza B NOT DETECTED NOT DETECTED Final   Parainfluenza Virus 1 NOT DETECTED NOT DETECTED Final   Parainfluenza Virus 2 NOT DETECTED NOT DETECTED Final   Parainfluenza Virus 3 NOT DETECTED NOT DETECTED Final   Parainfluenza Virus 4 NOT DETECTED NOT DETECTED Final   Respiratory Syncytial Virus NOT DETECTED NOT DETECTED Final   Bordetella pertussis NOT DETECTED NOT DETECTED Final   Bordetella Parapertussis NOT DETECTED NOT DETECTED Final   Chlamydophila pneumoniae NOT DETECTED NOT DETECTED Final   Mycoplasma pneumoniae NOT DETECTED NOT DETECTED Final    Comment: Performed at [facility], [address] [place], [place] [address]  Culture, blood (Routine X 2) w  Reflex to ID Panel     Status: None (Preliminary result)   Collection Time: 06/20/23  9:40 AM   Specimen: BLOOD LEFT ARM  Result Value Ref Range Status   Specimen Description BLOOD LEFT ARM  Final   Special Requests   Final    BOTTLES DRAWN AEROBIC AND ANAEROBIC Blood Culture adequate volume   Culture   Final    NO GROWTH 3 DAYS Performed at [facility], [address], [place], [place] [address]    Report Status PENDING  Incomplete  Culture, blood (Routine X 2) w Reflex to ID Panel     Status: None (Preliminary result)   Collection Time: 06/20/23  9:49 AM   Specimen: BLOOD RIGHT HAND  Result Value Ref Range Status   Specimen Description BLOOD RIGHT HAND  Final   Special Requests   Final    BOTTLES DRAWN AEROBIC AND ANAEROBIC Blood Culture adequate volume   Culture   Final    NO GROWTH 3 DAYS Performed at [facility], [address] [place], [place] [address]    Report Status PENDING  Incomplete    Radiology Studies: DG Chest 2 View  Result Date: 06/22/2023 CLINICAL DATA:  [phone] with CHF. EXAM: CHEST - 2 VIEW COMPARISON:  Portable chest  06/20/2023 at 5:13 a.m. FINDINGS: PA Lat at 5:13 a.m. The cardiac size, vascular pattern and mediastinal configuration are normal. The lungs clear. Thoracic cage is intact with slight lower thoracic levoscoliosis. Compare: Unchanged. IMPRESSION: No active cardiopulmonary disease. Electronically Signed   By: Almira Bar M.D.   On: 06/22/2023 05:36   ECHO TEE  Result Date: 06/21/2023    TRANSESOPHOGEAL ECHO REPORT   Patient Name:   Victor [name] Date of Exam: 06/21/2023 Medical Rec #:  [id number]   Height:       67.0 in Accession #:    [id number]  Weight:       190.1 lb Date of Birth:  [birth year]    BSA:          1.979 m Patient Age:    52 years    BP:           115/69 mmHg Patient Gender: M           HR:           83 bpm. Exam Location:  Inpatient Procedure: Transesophageal Echo, Cardiac Doppler, Color Doppler, 3D Echo and            Saline Contrast Bubble Study Indications:     Endocarditis  History:         Patient has prior history of Echocardiogram examinations, most                  recent 06/18/2023. Mitral Valve Disease,                  Signs/Symptoms:Shortness of Breath; Risk Factors:Hypertension.  Sonographer:     Lucendia Herrlich RCS Referring Phys:  [id number] Ronnald Ramp O'NEAL Diagnosing Phys: Lennie Odor MD PROCEDURE: After discussion of the risks and benefits of a TEE, an informed consent was obtained from the patient. TEE procedure time was 30 minutes. The transesophogeal probe was passed without difficulty through the esophogus of the patient. Imaged were obtained with the patient in a left lateral decubitus position. Sedation performed by different physician. The patient was monitored while under deep sedation. Anesthestetic sedation was provided intravenously by Anesthesiology: 700mg  of Propofol. Image quality was excellent. The  patient's vital signs; including heart rate, blood pressure, and oxygen saturation; remained stable throughout the procedure. The patient developed no complications  during the procedure.  IMPRESSIONS  1. Aortic valve is trileaflet without evidence of stenosis. 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 MG 19.4 mmHG. Vmax 3.1 m/s. Peak gradient 38.1 mmHG. There is SAM of the subchordal apparatus which has calcifications. Suspect this explains the dynamic obstruction which is related to [facility] of the chordal calcification and a subaortic membrane. The aortic valve is tricuspid. Aortic valve regurgitation is not visualized. No aortic stenosis is present.  2. Degnerative mitral valve with moderately calcified anterior and posterior mitral valve leaflets. There is a vegetation on the PMVL that measures 0.8 cm x 0.6 cm. There is severe mitral regurgitation due to incomplete leaflet coaptation, but cannot exclude a posterior mitral valve cleft. There is also chordal calcifications with SAM and dynamic outflow obstruction associated with a subaortic membrane as described. MVA 2.89 cm2. MG 4 mmHG @ 86 bpm. The mitral valve is degenerative. Severe mitral valve regurgitation. Mild mitral stenosis. The mean mitral valve gradient is 4.0 mmHg with average heart rate of 86 bpm.  3. Left ventricular ejection fraction, by estimation, is 65 to 70%. The left ventricle has normal function. There is moderate asymmetric left ventricular hypertrophy of the basal-septal segment.  4. Right ventricular systolic function is normal. The right ventricular size is normal.  5. No left atrial/left atrial appendage thrombus was detected. The LAA emptying velocity was 83 cm/s.  6. Agitated saline contrast bubble study was negative, with no evidence of any interatrial shunt. Conclusion(s)/Recommendation(s): Findings are concerning for vegetation/infective endocarditis as detailed above. FINDINGS  Left Ventricle: Left ventricular ejection fraction, by estimation, is 65 to 70%. The left ventricle has normal function. The left ventricular internal cavity size was normal in size. There is moderate  asymmetric left ventricular hypertrophy of the basal-septal segment. Right Ventricle: The right ventricular size is normal. No increase in right ventricular wall thickness. Right ventricular systolic function is normal. Left Atrium: Left atrial size was normal in size. No left atrial/left atrial appendage thrombus was detected. The LAA emptying velocity was 83 cm/s. Right Atrium: Right atrial size was normal in size. Pericardium: There is no evidence of pericardial effusion. Mitral Valve: Degnerative mitral valve with moderately calcified anterior and posterior mitral valve leaflets. There is a vegetation on the PMVL that measures 0.8 cm x 0.6 cm. There is severe mitral regurgitation due to incomplete leaflet coaptation, but  cannot exclude a posterior mitral valve cleft. There is also chordal calcifications with SAM and dynamic outflow obstruction associated with a subaortic membrane as described. MVA 2.89 cm2. MG 4 mmHG @ 86 bpm. The mitral valve is degenerative in appearance. There is moderate calcification of the anterior and posterior mitral valve leaflet(s). Severe mitral valve regurgitation. Mild mitral valve stenosis. MV peak gradient, 6.5 mmHg. The mean mitral valve gradient is 4.0 mmHg with average heart rate of 86 bpm. Tricuspid Valve: The tricuspid valve is grossly normal. Tricuspid valve regurgitation is trivial. No evidence of tricuspid stenosis. Aortic Valve: Aortic valve is trileaflet without evidence of stenosis. 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 MG 19.4 mmHG. Vmax 3.1 m/s. Peak gradient 38.1 mmHG. There is SAM of the subchordal apparatus which has calcifications. Suspect this explains the dynamic obstruction which is related to [facility] of the chordal calcification and a subaortic membrane. The aortic valve is tricuspid. Aortic valve regurgitation is not  visualized. No aortic stenosis is present. Aortic valve mean gradient measures 19.4 mmHg. Aortic valve peak gradient measures 38.1  mmHg. Pulmonic Valve: The pulmonic valve was grossly normal. Pulmonic valve regurgitation is not visualized. No evidence of pulmonic stenosis. Aorta: The aortic root and ascending aorta are structurally normal, with no evidence of dilitation. IAS/Shunts: The atrial septum is grossly normal. Agitated saline contrast was given intravenously to evaluate for intracardiac shunting. Agitated saline contrast bubble study was negative, with no evidence of any interatrial shunt. Additional Comments: Spectral Doppler performed. AORTIC VALVE AV Vmax:      308.52 cm/s AV Vmean:     205.507 cm/s AV VTI:       0.529 m AV Peak Grad: 38.1 mmHg AV Mean Grad: 19.4 mmHg  AORTA Ao Root diam: 3.10 cm Ao Asc diam:  3.00 cm MITRAL VALVE MV Peak grad: 6.5 mmHg MV Mean grad: 4.0 mmHg MV Vmax:      1.27 m/s MV Vmean:     91.4 cm/s Lennie Odor MD Electronically signed by Lennie Odor MD Signature Date/Time: 06/21/2023/3:51:51 PM    Final    EP STUDY  Result Date: 06/21/2023 See surgical note for result.   Scheduled Meds:  docusate sodium  100 mg Oral BID   folic acid  1 mg Oral Daily   furosemide  40 mg Oral Daily   gabapentin  300 mg Oral TID   methocarbamol  500 mg Oral TID   metoprolol tartrate  50 mg Oral BID   multivitamin with minerals  1 tablet Oral Daily   pantoprazole  40 mg Oral Daily   potassium chloride  40 mEq Oral Daily   rosuvastatin  20 mg Oral Daily   sodium chloride flush  3 mL Intravenous Q12H   thiamine  100 mg Oral Daily   Continuous Infusions:  cefTRIAXone (ROCEPHIN)  IV 2 g (06/23/23 1110)    LOS: 5 days   Marguerita Merles, DO [facility] Available via Epic secure chat 7am-7pm After these hours, please refer to coverage provider listed on [email or web address] 06/23/2023, 1:36 PM

## 2023-06-23 NOTE — H&P (View-Only) (Signed)
Cardiology Progress Note  Patient ID: Victor [name] MRN: [id number] DOB: 02/17/1971 Date of Encounter: 06/23/2023 Primary Cardiologist: Victor Bollman, MD  Subjective   Chief Complaint: SOB  HPI: Shortness of breath this morning.  Better with sitting up.  Euvolemic on exam.  ROS:  All other ROS reviewed and negative. Pertinent positives noted in the HPI.     Telemetry  Overnight telemetry shows sinus rhythm 90s, which I personally reviewed.    Physical Exam   Vitals:   06/22/23 1552 06/22/23 1947 06/22/23 2345 06/23/23 0608  BP: (!) 108/46 117/63 112/62 120/72  Pulse: 60 93 93 95  Resp: 17 18 20 20   Temp:  99.1 F (37.3 C) 100.2 F (37.9 C) 98.4 F (36.9 C)  TempSrc: Oral Oral Oral Oral  SpO2: 100% 99% 99% 99%  Weight:    84.7 kg  Height:        Intake/Output Summary (Last 24 hours) at 06/23/2023 0803 Last data filed at 06/23/2023 0610 Gross per 24 hour  Intake 1200 ml  Output 2150 ml  Net -950 ml       06/23/2023    6:08 AM 06/22/2023    6:30 AM 06/21/2023    4:08 AM  Last 3 Weights  Weight (lbs) 186 lb 12.8 oz 186 lb 4.6 oz 190 lb 1.6 oz  Weight (kg) 84.732 kg 84.5 kg 86.229 kg    Body mass index is 29.26 kg/m.  General: Well nourished, well developed, in no acute distress Head: Atraumatic, normal size  Eyes: PEERLA, EOMI  Neck: Supple, no JVD Endocrine: No thryomegaly Cardiac: Normal S1, S2; 3 out of 6 harsh holosystolic murmur Lungs: Clear to auscultation bilaterally, no wheezing, rhonchi or rales  Abd: Soft, nontender, no hepatomegaly  Ext: No edema, pulses 2+ Musculoskeletal: No deformities, BUE and BLE strength normal and equal Skin: Warm and dry, no rashes   Neuro: Alert and oriented to person, place, time, and situation, CNII-XII grossly intact, no focal deficits  Psych: Normal mood and affect   Cardiac Studies  TEE 06/21/2023  1. Aortic valve is trileaflet without evidence of stenosis. 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 MG  19.4 mmHG. Vmax 3.1 m/s.  Peak gradient 38.1 mmHG. There is SAM of the subchordal apparatus which  has calcifications. Suspect this  explains the dynamic obstruction which is related to [facility] of the chordal  calcification and a subaortic membrane. The aortic valve is tricuspid.  Aortic valve regurgitation is not visualized. No aortic stenosis is  present.   2. Degnerative mitral valve with moderately calcified anterior and  posterior mitral valve leaflets. There is a vegetation on the PMVL that  measures 0.8 cm x 0.6 cm. There is severe mitral regurgitation due to  incomplete leaflet coaptation, but cannot  exclude a posterior mitral valve cleft. There is also chordal  calcifications with SAM and dynamic outflow obstruction associated with a  subaortic membrane as described. MVA 2.89 cm2. MG 4 mmHG @ 86 bpm. The  mitral valve is degenerative. Severe mitral  valve regurgitation. Mild mitral stenosis. The mean mitral valve gradient  is 4.0 mmHg with average heart rate of 86 bpm.   3. Left ventricular ejection fraction, by estimation, is 65 to 70%. The  left ventricle has normal function. There is moderate asymmetric left  ventricular hypertrophy of the basal-septal segment.   4. Right ventricular systolic function is normal. The right ventricular  size is normal.   5. No left atrial/left atrial  appendage thrombus was detected. The LAA  emptying velocity was 83 cm/s.   6. Agitated saline contrast bubble study was negative, with no evidence  of any interatrial shunt.   Patient Profile  Victor [name] is a 52 y.o. male with hypertension, obesity, hyperlipidemia who was admitted on 06/17/2023 with shortness of breath and acute diastolic heart failure in the setting of sepsis.  Blood cultures are positive for Streptococcus salivarius.  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 -Admitted with bacteremia and mitral of endocarditis.  Has severe mitral valve regurgitation.  Symptomatic when he lays flat.  Continue with Lasix 40 mg daily.  Does not appear volume overloaded.  Chest x-ray is clear. -Also with subaortic membrane which could be a contact lesion due to mitral valve chordal calcifications with systolic anterior motion.  This could represent hypertrophic obstructive cardiomyopathy. -He will need mitral valve replacement and resection of the subaortic membrane.  We will proceed with cardiac MRI likely tomorrow to evaluate for hypertrophic cardiomyopathy. -We also discussed that he needs a left and right heart catheterization in anticipation for cardiac surgery.  We can try to get this done tomorrow as well. -N.p.o. at midnight.  Risk and benefits explained. -Continue metoprolol to tartrate 50 mg twice daily to help with outflow tract obstruction. -Blood cultures have cleared.  Informed Consent   Shared Decision Making/Informed Consent The risks [stroke (1 in 1000), death (1 in 1000), kidney failure [usually temporary] (1 in 500), bleeding (1 in 200), allergic reaction [possibly serious] (1 in 200)], benefits (diagnostic support and management of coronary artery disease) and alternatives of a cardiac catheterization were discussed in detail with Victor [name] and he is willing to proceed.      # Elevated troponin, demand -Suspect this is secondary to outflow tract obstruction and endocarditis.  Would hold on heparin for now.  Plan for left and right heart catheterization tomorrow.  # Anemia -Multifactorial likely related to endocarditis and acute illness.  Close monitoring.  No signs of bleeding.      For questions or updates, please contact [facility] Please consult [email or web address] for contact  info under     Signed, Victor Spore T. Flora Lipps, MD, [facility]  [facility]  06/23/2023 8:03 AM

## 2023-06-23 NOTE — Plan of Care (Signed)

## 2023-06-23 NOTE — Progress Notes (Signed)
Cardiology Progress Note  Patient ID: Victor [name] MRN: [id number] DOB: 02/17/1971 Date of Encounter: 06/23/2023 Primary Cardiologist: Tonny Bollman, MD  Subjective   Chief Complaint: SOB  HPI: Shortness of breath this morning.  Better with sitting up.  Euvolemic on exam.  ROS:  All other ROS reviewed and negative. Pertinent positives noted in the HPI.     Telemetry  Overnight telemetry shows sinus rhythm 90s, which I personally reviewed.    Physical Exam   Vitals:   06/22/23 1552 06/22/23 1947 06/22/23 2345 06/23/23 0608  BP: (!) 108/46 117/63 112/62 120/72  Pulse: 60 93 93 95  Resp: 17 18 20 20   Temp:  99.1 F (37.3 C) 100.2 F (37.9 C) 98.4 F (36.9 C)  TempSrc: Oral Oral Oral Oral  SpO2: 100% 99% 99% 99%  Weight:    84.7 kg  Height:        Intake/Output Summary (Last 24 hours) at 06/23/2023 0803 Last data filed at 06/23/2023 0610 Gross per 24 hour  Intake 1200 ml  Output 2150 ml  Net -950 ml       06/23/2023    6:08 AM 06/22/2023    6:30 AM 06/21/2023    4:08 AM  Last 3 Weights  Weight (lbs) 186 lb 12.8 oz 186 lb 4.6 oz 190 lb 1.6 oz  Weight (kg) 84.732 kg 84.5 kg 86.229 kg    Body mass index is 29.26 kg/m.  General: Well nourished, well developed, in no acute distress Head: Atraumatic, normal size  Eyes: PEERLA, EOMI  Neck: Supple, no JVD Endocrine: No thryomegaly Cardiac: Normal S1, S2; 3 out of 6 harsh holosystolic murmur Lungs: Clear to auscultation bilaterally, no wheezing, rhonchi or rales  Abd: Soft, nontender, no hepatomegaly  Ext: No edema, pulses 2+ Musculoskeletal: No deformities, BUE and BLE strength normal and equal Skin: Warm and dry, no rashes   Neuro: Alert and oriented to person, place, time, and situation, CNII-XII grossly intact, no focal deficits  Psych: Normal mood and affect   Cardiac Studies  TEE 06/21/2023  1. Aortic valve is trileaflet without evidence of stenosis. 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 MG  19.4 mmHG. Vmax 3.1 m/s.  Peak gradient 38.1 mmHG. There is SAM of the subchordal apparatus which  has calcifications. Suspect this  explains the dynamic obstruction which is related to [facility] of the chordal  calcification and a subaortic membrane. The aortic valve is tricuspid.  Aortic valve regurgitation is not visualized. No aortic stenosis is  present.   2. Degnerative mitral valve with moderately calcified anterior and  posterior mitral valve leaflets. There is a vegetation on the PMVL that  measures 0.8 cm x 0.6 cm. There is severe mitral regurgitation due to  incomplete leaflet coaptation, but cannot  exclude a posterior mitral valve cleft. There is also chordal  calcifications with SAM and dynamic outflow obstruction associated with a  subaortic membrane as described. MVA 2.89 cm2. MG 4 mmHG @ 86 bpm. The  mitral valve is degenerative. Severe mitral  valve regurgitation. Mild mitral stenosis. The mean mitral valve gradient  is 4.0 mmHg with average heart rate of 86 bpm.   3. Left ventricular ejection fraction, by estimation, is 65 to 70%. The  left ventricle has normal function. There is moderate asymmetric left  ventricular hypertrophy of the basal-septal segment.   4. Right ventricular systolic function is normal. The right ventricular  size is normal.   5. No left atrial/left atrial  appendage thrombus was detected. The LAA  emptying velocity was 83 cm/s.   6. Agitated saline contrast bubble study was negative, with no evidence  of any interatrial shunt.   Patient Profile  Victor [name] is a 52 y.o. male with hypertension, obesity, hyperlipidemia who was admitted on 06/17/2023 with shortness of breath and acute diastolic heart failure in the setting of sepsis.  Blood cultures are positive for Streptococcus salivarius.  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 -Admitted with bacteremia and mitral of endocarditis.  Has severe mitral valve regurgitation.  Symptomatic when he lays flat.  Continue with Lasix 40 mg daily.  Does not appear volume overloaded.  Chest x-ray is clear. -Also with subaortic membrane which could be a contact lesion due to mitral valve chordal calcifications with systolic anterior motion.  This could represent hypertrophic obstructive cardiomyopathy. -He will need mitral valve replacement and resection of the subaortic membrane.  We will proceed with cardiac MRI likely tomorrow to evaluate for hypertrophic cardiomyopathy. -We also discussed that he needs a left and right heart catheterization in anticipation for cardiac surgery.  We can try to get this done tomorrow as well. -N.p.o. at midnight.  Risk and benefits explained. -Continue metoprolol to tartrate 50 mg twice daily to help with outflow tract obstruction. -Blood cultures have cleared.  Informed Consent   Shared Decision Making/Informed Consent The risks [stroke (1 in 1000), death (1 in 1000), kidney failure [usually temporary] (1 in 500), bleeding (1 in 200), allergic reaction [possibly serious] (1 in 200)], benefits (diagnostic support and management of coronary artery disease) and alternatives of a cardiac catheterization were discussed in detail with Victor [name] and he is willing to proceed.      # Elevated troponin, demand -Suspect this is secondary to outflow tract obstruction and endocarditis.  Would hold on heparin for now.  Plan for left and right heart catheterization tomorrow.  # Anemia -Multifactorial likely related to endocarditis and acute illness.  Close monitoring.  No signs of bleeding.      For questions or updates, please contact [facility] Please consult [email or web address] for contact  info under     Signed, Gerri Spore T. Flora Lipps, MD, [facility] [place]  [facility]  06/23/2023 8:03 AM

## 2023-06-23 NOTE — Plan of Care (Signed)
  Problem: Nutrition: Goal: Adequate nutrition will be maintained Outcome: Progressing   Problem: Coping: Goal: Level of anxiety will decrease Outcome: Progressing   Problem: Elimination: Goal: Will not experience complications related to bowel motility Outcome: Progressing   

## 2023-06-23 NOTE — Progress Notes (Signed)
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 - Blood cultures on admission grew 2/2 GPC, BC ID stress species - Patient on vancomycin, cefepime, metronidazole.Marland Kitchen  Hospitalist course complicated by Tmax of 1 and 2.8. - Given dyspnea on exertion cardiology consulted for CHF evaluation.  Echo showed concern for subvalvular obstruction, SAM,  -TEE showed MV vegetation (0.4x0.4cm) with degenerative calcified leaflets and sever MR -MRI lumbar spine showed L5-S1 reactive endplate enhancement with infection diffilult to exclude  # Question of alcohol use disorder - Management per primary. No withdrawal symptoms thus far   Recommendations: -Continue ceftriaxone -Place PICC if blood Cx remain negative  at 4 days given he continues to fever -Follow repeat blood cx - CTS engaged recommend LHC and cardiac MRI for HOCM,  valve reapir  likely need to be done  inpatient -Primary discussed with NSY for management of vertebral osteo, no neurosurgical intervention needed

## 2023-06-24 ENCOUNTER — Encounter (HOSPITAL_COMMUNITY): Payer: Medicaid Other

## 2023-06-24 ENCOUNTER — Inpatient Hospital Stay (HOSPITAL_COMMUNITY): Payer: Medicaid Other

## 2023-06-24 ENCOUNTER — Encounter (HOSPITAL_COMMUNITY): Payer: Self-pay | Admitting: Cardiovascular Disease

## 2023-06-24 ENCOUNTER — Encounter (HOSPITAL_COMMUNITY)
Admission: EM | Disposition: A | Discharge status: Home Health | Payer: Self-pay | Source: Home / Self Care | Attending: Internal Medicine

## 2023-06-24 DIAGNOSIS — I3139 Other pericardial effusion (noninflammatory): Secondary | ICD-10-CM

## 2023-06-24 DIAGNOSIS — I340 Nonrheumatic mitral (valve) insufficiency: Secondary | ICD-10-CM | POA: Diagnosis not present

## 2023-06-24 DIAGNOSIS — B955 Unspecified streptococcus as the cause of diseases classified elsewhere: Secondary | ICD-10-CM | POA: Diagnosis not present

## 2023-06-24 DIAGNOSIS — M5416 Radiculopathy, lumbar region: Secondary | ICD-10-CM | POA: Diagnosis not present

## 2023-06-24 DIAGNOSIS — R0602 Shortness of breath: Secondary | ICD-10-CM | POA: Diagnosis not present

## 2023-06-24 DIAGNOSIS — I5031 Acute diastolic (congestive) heart failure: Secondary | ICD-10-CM | POA: Diagnosis not present

## 2023-06-24 DIAGNOSIS — I059 Rheumatic mitral valve disease, unspecified: Secondary | ICD-10-CM | POA: Diagnosis not present

## 2023-06-24 DIAGNOSIS — R7881 Bacteremia: Secondary | ICD-10-CM | POA: Diagnosis not present

## 2023-06-24 DIAGNOSIS — I2510 Atherosclerotic heart disease of native coronary artery without angina pectoris: Secondary | ICD-10-CM

## 2023-06-24 DIAGNOSIS — I10 Essential (primary) hypertension: Secondary | ICD-10-CM | POA: Diagnosis not present

## 2023-06-24 HISTORY — PX: RIGHT/LEFT HEART CATH AND CORONARY ANGIOGRAPHY: CATH118266

## 2023-06-24 LAB — CBC WITH DIFFERENTIAL/PLATELET
Abs Immature Granulocytes: 0.04 10*3/uL (ref 0.00–0.07)
Basophils Absolute: 0 10*3/uL (ref 0.0–0.1)
Basophils Relative: 0 %
Eosinophils Absolute: 0.2 10*3/uL (ref 0.0–0.5)
Eosinophils Relative: 2 %
HCT: 24 % — ABNORMAL LOW (ref 39.0–52.0)
Hemoglobin: 7.3 g/dL — ABNORMAL LOW (ref 13.0–17.0)
Immature Granulocytes: 1 %
Lymphocytes Relative: 22 %
Lymphs Abs: 1.8 10*3/uL (ref 0.7–4.0)
MCH: 28.9 pg (ref 26.0–34.0)
MCHC: 30.4 g/dL (ref 30.0–36.0)
MCV: 94.9 fL (ref 80.0–100.0)
Monocytes Absolute: 0.6 10*3/uL (ref 0.1–1.0)
Monocytes Relative: 8 %
Neutro Abs: 5.7 10*3/uL (ref 1.7–7.7)
Neutrophils Relative %: 67 %
Platelets: 221 10*3/uL (ref 150–400)
RBC: 2.53 MIL/uL — ABNORMAL LOW (ref 4.22–5.81)
RDW: 15.5 % (ref 11.5–15.5)
WBC: 8.4 10*3/uL (ref 4.0–10.5)
nRBC: 0 % (ref 0.0–0.2)

## 2023-06-24 LAB — POCT I-STAT EG7
Acid-base deficit: 3 mmol/L — ABNORMAL HIGH (ref 0.0–2.0)
Bicarbonate: 21.8 mmol/L (ref 20.0–28.0)
Calcium, Ion: 1.18 mmol/L (ref 1.15–1.40)
HCT: 23 % — ABNORMAL LOW (ref 39.0–52.0)
Hemoglobin: 7.8 g/dL — ABNORMAL LOW (ref 13.0–17.0)
O2 Saturation: 54 %
Potassium: 4.1 mmol/L (ref 3.5–5.1)
Sodium: 136 mmol/L (ref 135–145)
TCO2: 23 mmol/L (ref 22–32)
pCO2, Ven: 34.5 mm[Hg] — ABNORMAL LOW (ref 44–60)
pH, Ven: 7.408 (ref 7.25–7.43)
pO2, Ven: 28 mm[Hg] — CL (ref 32–45)

## 2023-06-24 LAB — COMPREHENSIVE METABOLIC PANEL
ALT: 63 U/L — ABNORMAL HIGH (ref 0–44)
AST: 84 U/L — ABNORMAL HIGH (ref 15–41)
Albumin: 2.5 g/dL — ABNORMAL LOW (ref 3.5–5.0)
Alkaline Phosphatase: 72 U/L (ref 38–126)
Anion gap: 8 (ref 5–15)
BUN: 15 mg/dL (ref 6–20)
CO2: 21 mmol/L — ABNORMAL LOW (ref 22–32)
Calcium: 8.7 mg/dL — ABNORMAL LOW (ref 8.9–10.3)
Chloride: 103 mmol/L (ref 98–111)
Creatinine, Ser: 0.63 mg/dL (ref 0.61–1.24)
GFR, Estimated: >60 mL/min (ref >60)
Glucose, Bld: 123 mg/dL — ABNORMAL HIGH (ref 70–99)
Potassium: 4.1 mmol/L (ref 3.5–5.1)
Sodium: 132 mmol/L — ABNORMAL LOW (ref 135–145)
Total Bilirubin: 0.3 mg/dL (ref <1.2)
Total Protein: 7.2 g/dL (ref 6.5–8.1)

## 2023-06-24 LAB — POCT I-STAT 7, (LYTES, BLD GAS, ICA,H+H)
Acid-base deficit: 3 mmol/L — ABNORMAL HIGH (ref 0.0–2.0)
Bicarbonate: 20.5 mmol/L (ref 20.0–28.0)
Calcium, Ion: 1.2 mmol/L (ref 1.15–1.40)
HCT: 23 % — ABNORMAL LOW (ref 39.0–52.0)
Hemoglobin: 7.8 g/dL — ABNORMAL LOW (ref 13.0–17.0)
O2 Saturation: 95 %
Potassium: 4.2 mmol/L (ref 3.5–5.1)
Sodium: 135 mmol/L (ref 135–145)
TCO2: 21 mmol/L — ABNORMAL LOW (ref 22–32)
pCO2 arterial: 29.7 mm[Hg] — ABNORMAL LOW (ref 32–48)
pH, Arterial: 7.446 (ref 7.35–7.45)
pO2, Arterial: 71 mm[Hg] — ABNORMAL LOW (ref 83–108)

## 2023-06-24 LAB — PHOSPHORUS: Phosphorus: 4 mg/dL (ref 2.5–4.6)

## 2023-06-24 LAB — MAGNESIUM: Magnesium: 2 mg/dL (ref 1.7–2.4)

## 2023-06-24 SURGERY — RIGHT/LEFT HEART CATH AND CORONARY ANGIOGRAPHY
Anesthesia: LOCAL

## 2023-06-24 MED ORDER — GADOBUTROL 1 MMOL/ML IV SOLN
8.0000 mL | Freq: Once | INTRAVENOUS | Status: AC | PRN
  Administered 2023-06-24: 8 mL via INTRAVENOUS

## 2023-06-24 MED ORDER — DIPHENHYDRAMINE HCL 50 MG/ML IJ SOLN
INTRAMUSCULAR | Status: AC
  Filled 2023-06-24: qty 1

## 2023-06-24 MED ORDER — DIPHENHYDRAMINE HCL 50 MG/ML IJ SOLN: 25.0000 mg | Freq: Once | INTRAMUSCULAR | Status: DC

## 2023-06-24 MED ORDER — HEPARIN SODIUM (PORCINE) 1000 UNIT/ML IJ SOLN
INTRAMUSCULAR | Status: DC | PRN
  Administered 2023-06-24: 4000 [IU] via INTRAVENOUS

## 2023-06-24 MED ORDER — HEPARIN (PORCINE) IN NACL 1000-0.9 UT/500ML-% IV SOLN
INTRAVENOUS | Status: DC | PRN
  Administered 2023-06-24: 1000 mL

## 2023-06-24 MED ORDER — PENICILLIN G POTASSIUM 20000000 UNITS IJ SOLR
12.0000 10*6.[IU] | Freq: Two times a day (BID) | INTRAVENOUS | Status: DC
  Administered 2023-06-24 – 2023-07-14 (×37): 12 10*6.[IU] via INTRAVENOUS
  Filled 2023-06-24 (×45): qty 12

## 2023-06-24 MED ORDER — DIPHENHYDRAMINE HCL 50 MG/ML IJ SOLN
INTRAMUSCULAR | Status: DC | PRN
  Administered 2023-06-24: 25 mg via INTRAVENOUS

## 2023-06-24 MED ORDER — FUROSEMIDE 40 MG PO TABS
40.0000 mg | ORAL_TABLET | Freq: Two times a day (BID) | ORAL | Status: DC
  Administered 2023-06-24 – 2023-07-01 (×15): 40 mg via ORAL
  Filled 2023-06-24 (×15): qty 1

## 2023-06-24 MED ORDER — SODIUM CHLORIDE 0.9 % IV SOLN: 250.0000 mL | INTRAVENOUS | Status: AC | PRN

## 2023-06-24 MED ORDER — LIDOCAINE HCL (PF) 1 % IJ SOLN
INTRAMUSCULAR | Status: AC
  Filled 2023-06-24: qty 30

## 2023-06-24 MED ORDER — ALBUTEROL SULFATE (2.5 MG/3ML) 0.083% IN NEBU: 2.5000 mg | INHALATION_SOLUTION | RESPIRATORY_TRACT | Status: DC | PRN

## 2023-06-24 MED ORDER — HEPARIN SODIUM (PORCINE) 1000 UNIT/ML IJ SOLN
INTRAMUSCULAR | Status: AC
  Filled 2023-06-24: qty 10

## 2023-06-24 MED ORDER — VERAPAMIL HCL 2.5 MG/ML IV SOLN
INTRAVENOUS | Status: DC | PRN
  Administered 2023-06-24: 10 mL via INTRA_ARTERIAL

## 2023-06-24 MED ORDER — FENTANYL CITRATE (PF) 100 MCG/2ML IJ SOLN
INTRAMUSCULAR | Status: AC
  Filled 2023-06-24: qty 2

## 2023-06-24 MED ORDER — SODIUM CHLORIDE 0.9% FLUSH
3.0000 mL | Freq: Two times a day (BID) | INTRAVENOUS | Status: DC
  Administered 2023-06-24 – 2023-07-01 (×12): 3 mL via INTRAVENOUS

## 2023-06-24 MED ORDER — METHYLPREDNISOLONE SODIUM SUCC 125 MG IJ SOLR
INTRAMUSCULAR | Status: AC
Start: 2023-06-24 — End: ?
  Filled 2023-06-24: qty 2

## 2023-06-24 MED ORDER — MIDAZOLAM HCL 2 MG/2ML IJ SOLN
INTRAMUSCULAR | Status: DC | PRN
  Administered 2023-06-24: 1 mg via INTRAVENOUS

## 2023-06-24 MED ORDER — FENTANYL CITRATE (PF) 100 MCG/2ML IJ SOLN
INTRAMUSCULAR | Status: DC | PRN
  Administered 2023-06-24: 25 ug via INTRAVENOUS

## 2023-06-24 MED ORDER — SODIUM CHLORIDE 0.9% FLUSH: 3.0000 mL | INTRAVENOUS | Status: DC | PRN

## 2023-06-24 MED ORDER — LABETALOL HCL 5 MG/ML IV SOLN: 10.0000 mg | INTRAVENOUS | Status: AC | PRN

## 2023-06-24 MED ORDER — MIDAZOLAM HCL 2 MG/2ML IJ SOLN
INTRAMUSCULAR | Status: AC
  Filled 2023-06-24: qty 2

## 2023-06-24 MED ORDER — IOHEXOL 350 MG/ML SOLN
INTRAVENOUS | Status: DC | PRN
  Administered 2023-06-24: 41 mL

## 2023-06-24 MED ORDER — VERAPAMIL HCL 2.5 MG/ML IV SOLN
INTRAVENOUS | Status: AC
  Filled 2023-06-24: qty 2

## 2023-06-24 MED ORDER — LIDOCAINE HCL (PF) 1 % IJ SOLN
INTRAMUSCULAR | Status: DC | PRN
  Administered 2023-06-24 (×2): 5 mL

## 2023-06-24 MED ORDER — ENOXAPARIN SODIUM 40 MG/0.4ML IJ SOSY
40.0000 mg | PREFILLED_SYRINGE | INTRAMUSCULAR | Status: DC
Start: 2023-06-25 — End: 2023-07-02
  Administered 2023-06-25 – 2023-07-01 (×6): 40 mg via SUBCUTANEOUS
  Filled 2023-06-24 (×6): qty 0.4

## 2023-06-24 MED ORDER — METHYLPREDNISOLONE SODIUM SUCC 125 MG IJ SOLR
INTRAMUSCULAR | Status: DC | PRN
  Administered 2023-06-24: 125 mg via INTRAVENOUS

## 2023-06-24 SURGICAL SUPPLY — 14 items
CATH 5FR JL3.5 JR4 ANG PIG MP (CATHETERS) IMPLANT
CATH BALLN WEDGE 5F 110CM (CATHETERS) IMPLANT
CATH LANGSTON DUAL LUM PIG 6FR (CATHETERS) IMPLANT
DEVICE RAD COMP TR BAND LRG (VASCULAR PRODUCTS) IMPLANT
ELECT DEFIB PAD ADLT CADENCE (PAD) IMPLANT
GLIDESHEATH SLEND SS 6F .021 (SHEATH) IMPLANT
GUIDEWIRE INQWIRE 1.5J.035X260 (WIRE) IMPLANT
INQWIRE 1.5J .035X260CM (WIRE) ×1 IMPLANT
PACK CARDIAC CATHETERIZATION (CUSTOM PROCEDURE TRAY) ×2 IMPLANT
SET ATX-X65L (MISCELLANEOUS) IMPLANT
SHEATH GLIDE SLENDER 4/5FR (SHEATH) IMPLANT
SHEATH PROBE COVER 6X72 (BAG) IMPLANT
TRANSDUCER W/STOPCOCK (MISCELLANEOUS) IMPLANT
TUBING ART PRESS 72 MALE/FEM (TUBING) IMPLANT

## 2023-06-24 NOTE — Progress Notes (Addendum)
Patient Name: Victor [name] Date of Encounter: 06/24/2023 [facility] Cardiologist: Tonny Bollman, MD   Interval Summary  .    Feeling better this morning. Breathing is much improved.   Vital Signs .    Vitals:   06/23/23 2359 06/24/23 0330 06/24/23 0348 06/24/23 0740  BP: 119/71  122/61 118/71  Pulse:    93  Resp: 18 (!) 24 19 18   Temp: 98.2 F (36.8 C)  99.5 F (37.5 C) 99.5 F (37.5 C)  TempSrc: Oral  Oral Oral  SpO2: 96%  95% 98%  Weight:  84.3 kg    Height:        Intake/Output Summary (Last 24 hours) at 06/24/2023 0905 Last data filed at 06/24/2023 1610 Gross per 24 hour  Intake 480 ml  Output 1450 ml  Net -970 ml      06/24/2023    3:30 AM 06/23/2023    6:08 AM 06/22/2023    6:30 AM  Last 3 Weights  Weight (lbs) 185 lb 13.6 oz 186 lb 12.8 oz 186 lb 4.6 oz  Weight (kg) 84.3 kg 84.732 kg 84.5 kg      Telemetry/ECG    Sinus rhythm- Personally Reviewed  Physical Exam .   GEN: No acute distress.   Neck: No JVD Cardiac: RRR, 3/6 harsh systolic murmurs at apex, no rubs, or gallops.  Respiratory: Clear to auscultation bilaterally. GI: Soft, nontender, non-distended  MS: No edema  Assessment & Plan .     52 y.o. male with hypertension, obesity, hyperlipidemia who was admitted on 06/17/2023 with shortness of breath and acute diastolic heart failure in the setting of sepsis.  Blood cultures are positive for Streptococcus salivarius.   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 twice daily -- Also found to have subaortic membrane which could be a contact lesion due to mitral valve chordal calcification with SAM, possible hypertrophic obstructive  cardiomyopathy -- Plan for right and left heart cath today -- Also plans for cardiac MRI to evaluate for hypertrophic cardiomyopathy -- Continue metoprolol 50 mg twice daily -- TCTS following   Bacteremia/endocarditis -- 2/2 blood cultures with Streptococcus salivarius bacteremia -- Had an outpatient MRI lumbar spine showing L5-S1 reactive endplate enhancement with infection difficult to exclude -- ID following  Elevated troponin -- Suspect secondary to possible HOCM as well as endocarditis  Per Primary Anemia Thrombocytopenia Hyponatremia EtOH use  For questions or updates, please contact [facility] Please consult [email or web address] for contact info under   Addendum: patient had itching with contrast during cath today. Given solu-medrol/benadryl. There was mention of possible allergy with gadolinium but confirmed with the patient no issues with his MRI that was done prior to admission.     Signed, Laverda Page, NP   Agree with note by Laverda Page NP-C  Right left heart cath was performed by Dr. Okey Dupre today.  He had minimal nonobstructive CAD with elevated V waves and filling pressures.  He also had a subaortic gradient which increased with PVCs suggesting potential hypertrophic cardiomyopathy.  Cardiac MRI plan for later today.  Patient does have severe MR with mitral valve SBE being followed by ID and T CTS for possible mitral valve replacement.  Stable exam with a loud MR murmur.  Christiane Ha  Erlene Quan, M.D., FACP, [facility], [facility] [address] [place], [place]  [address]  [phone] 06/24/2023 12:07 PM

## 2023-06-24 NOTE — Progress Notes (Signed)
PT Cancellation Note  Patient Details Name: Victor [name] MRN: [id number] DOB: Nov 13, 1970   Cancelled Treatment:    Reason Eval/Treat Not Completed: Patient at procedure or test/unavailable; patient down for MRI, will attempt at another time.    Elray Mcgregor 06/24/2023, 3:02 PM Sheran Lawless, PT Acute Rehabilitation Services [phone] 06/24/2023

## 2023-06-24 NOTE — Interval H&P Note (Signed)
History and Physical Interval Note:  06/24/2023 9:20 AM  Victor [name]  has presented today for surgery, with the diagnosis of mitral regurgitation and possible HOCM.  The various methods of treatment have been discussed with the patient and family. After consideration of risks, benefits and other options for treatment, the patient has consented to  Procedure(s): RIGHT/LEFT HEART CATH AND CORONARY ANGIOGRAPHY (N/A) as a surgical intervention.  The patient's history has been reviewed, patient examined, no change in status, stable for surgery.  I have reviewed the patient's chart and labs.  Questions were answered to the patient's satisfaction.    Cath Lab Visit (complete for each Cath Lab visit)  Clinical Evaluation Leading to the Procedure:   ACS: No.  Non-ACS:    Anginal/Heart Failure Classification: NYHA class IV  Anti-ischemic medical therapy: Minimal Therapy (1 class of medications)  Non-Invasive Test Results: No non-invasive testing performed  Prior CABG: No previous CABG  Victor [name]

## 2023-06-24 NOTE — Plan of Care (Signed)
  Problem: Activity: Goal: Risk for activity intolerance will decrease Outcome: Progressing   Problem: Nutrition: Goal: Adequate nutrition will be maintained Outcome: Progressing   

## 2023-06-24 NOTE — Anesthesia Postprocedure Evaluation (Signed)
Anesthesia Post Note  Patient: Victor [name]  Procedure(s) Performed: TRANSESOPHAGEAL ECHOCARDIOGRAM     Patient location during evaluation: PACU Anesthesia Type: MAC Level of consciousness: awake and alert Pain management: pain level controlled Vital Signs Assessment: post-procedure vital signs reviewed and stable Respiratory status: spontaneous breathing, nonlabored ventilation, respiratory function stable and patient connected to nasal cannula oxygen Cardiovascular status: stable and blood pressure returned to baseline Postop Assessment: no apparent nausea or vomiting Anesthetic complications: no   There were no known notable events for this encounter.           Mariann Barter

## 2023-06-24 NOTE — Progress Notes (Signed)
OT Cancellation Note  Patient Details Name: Reinhardt Hillis MRN: [id number] DOB: 1970/12/04   Cancelled Treatment:    Reason Eval/Treat Not Completed: Patient at procedure or test/ unavailable.  Pt down for cardiac procedure at this time.  Will check back and see later today vs tomorrow depending on medical limitations.    Brieanna Nau OTR/L 06/24/2023, 9:47 AM

## 2023-06-24 NOTE — Progress Notes (Signed)
PROGRESS NOTE    Victor [name]  ZOX:[id number] DOB: 07-21-1971 DOA: 06/17/2023 PCP: Claiborne Rigg, NP   Brief Narrative:  HPI per Dr. Ramon Dredge   Patient is a 52 years old male with past medical history of hyperlipidemia, obesity, hypertension presented to hospital with exertional shortness of breath, bilateral lower extremity edema and pain for the last 2 weeks.  Patient also complains of shortness of breath at rest and has been having difficulty lying down in bed.  Complains of mild cough with clear sputum production and has subjective fever at home.  Patient stated that he was taking prednisone and analgesics for his back pain and had been to a spine specialist as outpatient and had an MRI done yesterday.  Complains of some shooting pain on the lower legs.  Patient stated that he has been noticing increasing swelling of the legs over the last 2 weeks or so.  Patient denies any obvious blood in the stool but had some dark stools when he was taking prednisone.  His bowels have cleared up now.  He complains of gaseous feeling heartburn as well.  Patient admits to drinking alcohol 16 ounces not every day and denies any withdrawal symptoms from it.  Orthopnea or PND.  Was taking prednisone and analgesics as outpatient and noted some dark stools.  Denies any urinary urgency, frequency or dysuria.  Has not seen further black stools.  He complains of mild chest discomfort especially after eating food with some burning sensation.  Patient has been having difficulty ambulation due to dyspnea.  Denies any dizziness, lightheadedness or syncope.   In the ED, vitals were notable for tachycardia with tachypnea and temperature was borderline at 99.5 F.  Initial labs showed hyponatremia with sodium of 127 hemoglobin low at 8.3 and platelets low at 142.  BNP was elevated at 592.  Initial troponin I was 60.  EKG showed sinus tachycardia with right bundle branch block.  Chest x-ray did not show any infiltrate.  In  the ED patient received 40 mg of IV Lasix and was considered for admission to the hospital for new onset shortness of breath leg swelling.  **Interim History Cardiology was consulted and patient is being diuresed today but cardiology is concerned given that he has his mitral stenosis which is moderate with possible systolic anterior motion.  Cardiology recommending undergoing a TEE for more definitive evaluation.  On admission he had a temperature of unclear etiology and was not septic appearing but Blood Cx showing Streptococcal Species. Blood Cx being repeated 06/20/23 and ID has been consulted and Abx just de-escalated to IV Vancomycin. Have asked Radiology to Read the outpatient MRI done a few days ago and still not done but ID felt that his MRI should be repeated and he underwent MRI of the lumbar spine with contrast yesterday.  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   Cardiac catheterization done and he had minimal nonobstructive CAD with elevated V waves and filling pressures as well as  a subaortic gradient with increased PVCs suggesting potential hyperacute trophic cardiomyopathy.  Cardiac MRI is currently being done and pending read.  Assessment and Plan:  Shortness of Breath, Dyspnea on Exertion with lower extremity swelling in the setting of Acute on Chronic Diastolic CHF Moderate AS and Moderate MS with possible Systolic Anterior Motion Severe mitral valve regurgitation with a subaortic membrane  with mitral valve chordal calcifications and systolic anterior motion -Patient seems to be thinking that this is something new but did have a history of similar complaints in 2015 and had done 2D echocardiogram 2015 with LV function of 65%.  -BNP elevated at 592.9    -Possibility of anemia causing dyspnea as well.  -Chest x-ray was negative.   Received 40 mg of IV of Lasix in the ED and was on IV Lasix 40 mg BID but now Cardiology holding IV diuresis and changing to po Lasix today ordered after overdiuresis with his HCM physiology however because of the shortness of breath the day before yesterday did give him a dose of IV Lasix 80 g x 1; Currently Cardiology recommending continuing po Lasix 40 mg Daily  -Strict I's and O's and daily weights  Intake/Output Summary (Last 24 hours) at 06/24/2023 1558 Last data filed at 06/24/2023 1320 Gross per 24 hour  Intake 597 ml  Output 1750 ml  Net -1153 ml   -Echocardiogram done and showed an LVEF of 70 to 75% but did show concern for subvalvular obstruction with peak gradient as high as 117 mmHg with Valsalva.  Patient also noted to have moderate mitral stenosis with possible SAM and moderate aortic stenosis -Cardiology recommends continuing diuresis today but recommending that he undergo TEE for more definitive evaluation and we will attempt to placee the patient on the schedule for Friday -Given his concern for HCM they are going to add low-dose beta-blocker therapy with metoprolol tartrate 12.5 mg p.o. twice daily but this was uptitrated to 50 mg po BID and being continued. 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   -Likely nonspecific or secondary to heart failure. -EKG without any ischemic changes.   -Added PPI with Pantoprazole 40 mg po daily -If necessary will place on Maalox. -Troponin I went from 60 -> 53 -> 63 -> 97 -> 162 -ECHO showed EF of 70-75% -Cardiology suspects that he has chest discomfort in the setting of his heart failure and recommending continuing po Diuresis for now    History of Hypertension -Continue to monitor closely.   -On Amlodipine and Losartan at home which have been held. -Cardiology added po Metoprolol Tartrate 50 mg BID -C/w IV Hydralazine 10 mg q4hprn HBP  -Continue to Monitor BP per Protocol -Last BP reading was 122/59   Fever in the setting of Streptococcal Bacteremia and severe mitral valve regurgitation with mitral valve Vegetation  -Spiked a Temp of 102.8 on admission and continues to spike temperatures -Afebrile now but WBC is not elevated -WBC Trend: Recent Labs  Lab 06/17/23 2240 06/19/23 0239 06/20/23 0214 06/21/23 0224 06/22/23 0210 06/23/23  0230 06/24/23 0301  WBC 9.6 8.5 7.8 8.3 8.3 9.5 8.4  -Urinalysis was unremarkable -Blood Cx x2 done and showed GPC in Chains and was showing Streptococcal Species in 4/4 Cx's with Sensitivities pending; Repeat Blood Cx 06/20/23 showing NGTD at 3 Days -Respiratory virus panel via PCR is negative as well as SARS-CoV-2 and DG chest x-ray done and showed "Slightly more prominent interstitial markings at both lung bases could reflect mild interstitial edema or atypical infection." -Repeat CXR yesterday and showed no active Disease  -Empirically placed on IV antibiotics with cefepime and IV Vancomycin and IV Metronidazole; ID discontinued IV Metronidazole and IV Cefepime and continued IV Vancomycin but per discussion with the ID pharmacist on 06/24/2023 they will be  de-escalating to PCN -De-escalate Abx as needed pending further culture results -Cardiology recommends a TEE to further evaluate his valvular and myocardial disease and also rule out infective endocarditis as a source of his fevers as there was no obvious vegetations on TTE; TEE as above -Will get ID involved for further evaluation and management and defer further Abx management to them  -Given the patient has a streptococcal bacteremia with severe mitral regurgitation and mitral valve apparatus calcification of the subaortic membrane and 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    -Platelet Count Trend: Recent Labs  Lab 06/17/23 2240 06/19/23 0239 06/20/23 0214 06/21/23 0224 06/22/23 0210 06/23/23 0230 06/24/23 0301  PLT 142* 146* 155 170 210 226 221  -Continue to monitor for signs and symptoms of bleeding -No overt bleeding noted and repeat CBC in the a.m.  Metabolic Acidosis -Mild. CO2 is now 21, AG is 8, and Chloride Level is 103 -Continue to Monitor and Trend and repeat CMP in the AM  HLD -C/w Rosuvastatin 20 mg po Daily  but may need to hold if LFTs continue to worsen   Abnormal LFTs, -LFT Trend: Recent Labs  Lab 06/20/23 0214 06/21/23 0224 06/22/23 0210 06/23/23 0230 06/24/23 0301  AST 50* 53* 68* 63* 84*  ALT 33 35 43 44 63*  -Continue to Monitor and Trend and if Necessary will obtain RUQ U/S and an Acute Hepatitis Panel in the AM -Repeat CMP in the AM   Hyponatremia -Uncertain etiology at this time but likely in the setting of volume overload -Renal function within normal range.   -Checked TSH 1.163.   -Na+ Trend: Recent Labs  Lab 06/20/23 0214 06/21/23 0224 06/22/23 0210 06/23/23 0230 06/24/23 0301 06/24/23 0948 06/24/23 0950  NA 130* 131* 131* 132* 132* 136 135  -Could be related to drinking beer.   -Repeat CMP in the AM   Alcohol Use Disorder -Drinks 16 ounce beer probably every day.  Denies any  withdrawal symptoms.   -Placed patient on Thiamine 100 mg po Daily,  Folic Acid 1 ,g po daily and Multivitamin + Minerals 1 tab po Daily.   -Closely monitor for any withdrawal symptoms.  Prediabetes -HbA1c was 5.9 -Continue to Monitor CBGs and Glucose per protocol -CBG Trend: No results for input(s): "GLUCAP" in the last 720 hours. -Glucose Trend:  Recent Labs  Lab 06/17/23 2240 06/19/23 0241 06/20/23 0214 06/21/23 0224 06/22/23 0210 06/23/23 0230 06/24/23 0301  GLUCOSE 134* 133* 121* 122* 120* 121* 123*  -If necessary will place on Sensitive Novolog SSI AC/HS   Low Back Pain, Lumbar Radiculopathy  -Had seen a spine specialist as outpatient and MRI of the lumbar spine was ordered on 06/16/2025.   -Completed course of prednisone.   -Currently taking Tylenol for pain.   -Will need to follow-up with orthopedics as outpatient.   -Will get PT OT evaluation while in the hospital..   -Added Oxycodone IR 5 mg po q4hprn and will continue  -Continue Gabapentin 300 mg po TID and muscle relaxants with Methocarbamol 500 mg po TID -Patient had an outpatient MRI on 06/17/2023 which has  not yet resulted and I called yesterday to get it read as a Priority and still has not been read yet.  -ID repeated MRI Lumbar Spine with Contrast and showed "Reactive endplate change with endplate enhancement about the L5-S1 interspace, favored to be degenerative in nature, although possible changes of infection are difficult to exclude, and could be considered in the correct clinical setting. If the clinical picture is equivocal for spinal infection, a short interval follow-up exam to evaluate for interval changes may be helpful for further evaluation as warranted. No other evidence for acute or active infection elsewhere within the lumbar spine. Advanced degenerative disc disease at L5-S1 with resultant mild bilateral L5 foraminal stenosis. Additional mild spondylosis elsewhere within the lumbar spine as above. No other significant stenosis or impingement." -I spoke with Verlin Dike of Neurosurgery and the neurosurgery team is evaluated the imaging and feels that no neurosurgical intervention is needed and they are just recommending continuing antibiotics but if necessary recommending getting IR to see if they can drain her biopsy but they feel it would not change management  Normocytic Anemia -Hgb/Hct Trend: Recent Labs  Lab 06/20/23 0214 06/21/23 0224 06/22/23 0210 06/23/23 0230 06/24/23 0301 06/24/23 0948 06/24/23 0950  HGB 7.6* 7.4* 7.6* 7.5* 7.3* 7.8* 7.8*  HCT 24.4* 24.1* 23.8* 24.0* 24.0* 23.0* 23.0*  MCV 93.5 94.9 94.8 93.8 94.9  --   --   -FOBT negative but reported Dark Stools; Continue PPI with Pantoprazole -May benefit from Iron Transfusion but will hold off given bacteremia -Hold ASA for now as well as NSAIDs -Anemia panel was done and showed an iron level of 18, UIBC of 237, TIBC 255, saturation ratios of 7%, ferritin of 248, folate level 9.8, vitamin B12 level 540 -Continue to Monitor for S/Sx of Bleeding; no overt bleeding noted -Repeat CBC in the  AM  Hypoalbuminemia -Patient's Albumin Trend: Recent Labs  Lab 06/20/23 0214 06/21/23 0224 06/22/23 0210 06/23/23 0230 06/24/23 0301  ALBUMIN 2.3* 2.3* 2.4* 2.5* 2.5*  -Continue to Monitor and Trend and repeat CMP in the AM  Overweight -Complicates overall prognosis and care -Estimated body mass index is 29.11 kg/m as calculated from the following:   Height as of this encounter: 5\' 7"  (1.702 m).   Weight as of this encounter: 84.3 kg.  -Weight Loss and Dietary Counseling given  DVT prophylaxis: enoxaparin (LOVENOX) injection 40 mg Start: 06/25/23 0800 SCDs Start: 06/18/23 0858    Code Status: Full Code Family Communication: No family present at bedside  Disposition Plan:  Level of care: Telemetry Cardiac Status is: Inpatient Remains inpatient appropriate because: Needs further cardiac clearance and surgical intervention by CVTS   Consultants:  Cardiology Infectious Diseases Cardiothoracic Surgery Discussed with Neurosurgery  Procedures:  TRANSTHORACIC ECHOCARDIOGRAM IMPRESSIONS     1. Left ventricular ejection fraction, by estimation, is 70 to 75%. The  left ventricle has hyperdynamic function. The LV outflow tract is  narrowed. There is a mid-cavity gradient peak 48 mmHg. There appears to be  an LV outflow peak gradient as high as  117 mmHg with Valsalva. Possible mild systolic anterior motion of the  mitral valve. The left ventricle has no regional wall motion  abnormalities. There is mild concentric left ventricular hypertrophy. Left  ventricular diastolic parameters are consistent  with Grade II diastolic dysfunction (pseudonormalization).   2. Right ventricular systolic function is normal. The right ventricular  size is normal. Tricuspid regurgitation signal is inadequate for assessing  PA pressure.   3. Left atrial size was moderately dilated.   4. The mitral valve is abnormal. The valve is moderately calcified and  restricted with calcification of the  chords. Possible mild SAM but cannot  be definitive. Trivial mitral valve regurgitation. Probably moderate  mitral stenosis. The mean mitral valve  gradient is 11.0 mmHg.   5. The aortic valve was not well visualized. Aortic valve regurgitation  is not visualized. Moderate aortic valve stenosis. Aortic valve mean  gradient measures 22.0 mmHg.   6. The inferior vena cava is normal in size with <50% respiratory  variability, suggesting right atrial pressure of 8 mmHg.   7. Cannot rule out PFO vs small ASD (possible color flow across septum).   8. This patient needs a TEE. There are a number of abnormalities that are  difficult to work out by TTE. He has a hyperdynamic left ventricle with a  mid-cavity gradient. The LVOT is also significantly narrowed, possibly  with SAM, with very high LVOT  gradient with valsalva. 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 However, the mitral valve and aortic valve themselves are  both calcified and thickened and appear to have moderate stenosis.   FINDINGS   Left Ventricle: Left ventricular ejection fraction, by estimation, is 70  to 75%. The left ventricle has hyperdynamic function. The left ventricle  has no regional wall motion abnormalities. The left ventricular internal  cavity size was normal in size.  There is mild concentric left ventricular hypertrophy. Left ventricular  diastolic parameters are consistent with Grade II diastolic dysfunction  (pseudonormalization).   Right Ventricle: The right ventricular size is normal. No increase in  right ventricular wall thickness. Right ventricular systolic function is  normal. Tricuspid regurgitation signal is inadequate for assessing PA  pressure.   Left Atrium: Left atrial size was moderately dilated.   Right Atrium: Right atrial size was normal in size.   Pericardium: Trivial pericardial effusion is present.   Mitral Valve: The mitral valve is abnormal. There is moderate   calcification of the mitral valve leaflet(s). Moderate mitral annular  calcification. Trivial mitral valve regurgitation. Moderate mitral valve  stenosis. MV peak gradient, 19.0 mmHg. The mean  mitral valve gradient is 11.0 mmHg.   Tricuspid Valve: The tricuspid valve is normal in structure. Tricuspid  valve regurgitation is not demonstrated.   Aortic Valve: The  aortic valve was not well visualized. Aortic valve  regurgitation is not visualized. Moderate aortic stenosis is present.  Aortic valve mean gradient measures 22.0 mmHg. Aortic valve peak gradient  measures 37.2 mmHg.   Pulmonic Valve: The pulmonic valve was normal in structure. Pulmonic valve  regurgitation is not visualized.   Aorta: The aortic root is normal in size and structure.   Venous: The inferior vena cava is normal in size with less than 50%  respiratory variability, suggesting right atrial pressure of 8 mmHg.   IAS/Shunts: Cannot rule out PFO vs small ASD (possible color flow across  septum).     LEFT VENTRICLE  PLAX 2D  LVIDd:         4.30 cm   Diastology  LVIDs:         2.70 cm   LV e' medial:    7.46 cm/s  LV PW:         1.20 cm   LV E/e' medial:  28.2  LV IVS:        1.00 cm   LV e' lateral:   9.48 cm/s  LVOT diam:     2.00 cm   LV E/e' lateral: 22.2  LVOT Area:     3.14 cm     RIGHT VENTRICLE             IVC  RV Basal diam:  2.60 cm     IVC diam: 2.10 cm  RV S prime:     16.00 cm/s  TAPSE (M-mode): 2.5 cm   LEFT ATRIUM             Index        RIGHT ATRIUM           Index  LA diam:        4.50 cm 2.27 cm/m   RA Area:     14.70 cm  LA Vol (A2C):   86.4 ml 43.66 ml/m  RA Volume:   36.50 ml  18.45 ml/m  LA Vol (A4C):   96.0 ml 48.51 ml/m  LA Biplane Vol: 92.2 ml 46.59 ml/m   AORTIC VALVE  AV Vmax:      305.00 cm/s  AV Vmean:     221.000 cm/s  AV VTI:       0.471 m  AV Peak Grad: 37.2 mmHg  AV Mean Grad: 22.0 mmHg    AORTA  Ao Root diam: 2.70 cm  Ao Asc diam:  2.80 cm   MITRAL  VALVE  MV Area (PHT): 3.46 cm     SHUNTS  MV Peak grad:  19.0 mmHg    Systemic Diam: 2.00 cm  MV Mean grad:  11.0 mmHg  MV Vmax:       2.18 m/s  MV Vmean:      156.0 cm/s  MV Decel Time: 219 msec  MV E velocity: 210.00 cm/s  MV A velocity: 162.00 cm/s  MV E/A ratio:  1.30    TRANSESOPHAGEAL ECHOCARDIOGRAM KEY FINDINGS:   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  Normal LV/RV function.  Full report to follow. Further management per primary team.    RIGHT AND LEFT CARDIAC CATHETERIZATION AND CORONARY ANGIOGRAPHY Conclusions: Nonobstructive coronary artery disease, as detailed below, including sequential 20% and 20-30% proximal/mid LAD stenoses, 50-60% ostial D1 lesion, and mild plaquing of  OM1 and mid RCA. Severely elevated left heart and pulmonary artery pressures (PCWP 34 mmHg, LVEDP 40 mmHg, mean PA 46 mmHg). Mildly elevated right heart filling pressures (mean RA 7 mmHg, RVEDP 9 mmHg). Normal Fick cardiac output/index (CO 6.4 L/min, CI 3.3 L/min/m^2). 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 Suspected acute allergic reaction to IV contrast treated with IV diphenhydramine and methylprednisolone.   Recommendations: Escalate diuresis, as tolerated. 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   Antimicrobials:  Anti-infectives (From admission, onward)    Start     Dose/Rate Route Frequency Ordered Stop   06/24/23 2200  penicillin G potassium 12 Million Units in dextrose 5 % 500 mL CONTINUOUS infusion        12 Million Units 41.7 mL/hr over 12 Hours  Intravenous Every 12 hours 06/24/23 1522     06/21/23 1115  cefTRIAXone (ROCEPHIN) 2 g in sodium chloride 0.9 % 100 mL IVPB  Status:  Discontinued        2 g 200 mL/hr over 30 Minutes Intravenous Every 24 hours 06/21/23 1017 06/24/23 1522   06/19/23 1600  vancomycin (VANCOREADY) IVPB 1250 mg/250 mL  Status:  Discontinued        1,250 mg 166.7 mL/hr over 90 Minutes Intravenous Every 12 hours 06/19/23 0323 06/21/23 1017   06/19/23 1200  ceFEPIme (MAXIPIME) 2 g in sodium chloride 0.9 % 100 mL IVPB  Status:  Discontinued        2 g 200 mL/hr over 30 Minutes Intravenous Every 8 hours 06/19/23 0323 06/20/23 1156   06/19/23 0145  vancomycin (VANCOREADY) IVPB 1750 mg/350 mL        1,750 mg 175 mL/hr over 120 Minutes Intravenous  Once 06/19/23 0051 06/19/23 1044   06/19/23 0145  ceFEPIme (MAXIPIME) 2 g in sodium chloride 0.9 % 100 mL IVPB        2 g 200 mL/hr over 30 Minutes Intravenous  Once 06/19/23 0051 06/19/23 0908   06/19/23 0045  metroNIDAZOLE (FLAGYL) IVPB 500 mg  Status:  Discontinued        500 mg 100 mL/hr over 60 Minutes Intravenous Every 12 hours 06/19/23 0041 06/20/23 1156       Subjective: Seen and examined at bedside and thinks he is doing okay after his heart catheterization.  Awaiting to go for his cardiac MRI.  No nausea or vomiting.  Denies any shortness breath at this time.  States that he did not sleep very much.  No other concerns or complaints this time.  Objective: Vitals:   06/24/23 1045 06/24/23 1100 06/24/23 1115 06/24/23 1130  BP: 115/61 113/68 112/64 (!) 122/59  Pulse:    95  Resp: 20 15 20 20   Temp:      TempSrc:      SpO2: 96% 94% 93%   Weight:      Height:        Intake/Output Summary (Last 24 hours) at 06/24/2023 1605 Last data filed at 06/24/2023 1320 Gross per 24 hour  Intake 597 ml  Output 1750 ml  Net -1153 ml   Filed Weights   06/22/23 0630 06/23/23 0608 06/24/23 0330  Weight: 84.5 kg 84.7 kg 84.3 kg   Examination: Physical  Exam:  Constitutional: WN/WD overweight African-American male in no acute distress Respiratory: Diminished to auscultation bilaterally, no  wheezing, rales, rhonchi or crackles. Normal respiratory effort and patient is not tachypenic. No accessory muscle use.  Unlabored breathing Cardiovascular: RRR, has a loud 3 out of 6 systolic murmur noted.  No appreciable extremity edema Abdomen: Soft, non-tender, distended secondary to body habitus. Bowel sounds positive.  GU: Deferred. Musculoskeletal: No clubbing / cyanosis of digits/nails. No joint deformity upper and lower extremities.  Skin: No rashes, lesions, ulcers on limited skin evaluation. No induration; Warm and dry.  Neurologic: CN 2-12 grossly intact with no focal deficits.  Romberg sign and cerebellar reflexes not assessed.  Psychiatric: Normal judgment and insight. Alert and oriented x 3. Normal mood and appropriate affect.   Data Reviewed: I have personally reviewed following labs and imaging studies  CBC: Recent Labs  Lab 06/20/23 0214 06/21/23 0224 06/22/23 0210 06/23/23 0230 06/24/23 0301 06/24/23 0948 06/24/23 0950  WBC 7.8 8.3 8.3 9.5 8.4  --   --   NEUTROABS 5.2 5.8 5.7 6.5 5.7  --   --   HGB 7.6* 7.4* 7.6* 7.5* 7.3* 7.8* 7.8*  HCT 24.4* 24.1* 23.8* 24.0* 24.0* 23.0* 23.0*  MCV 93.5 94.9 94.8 93.8 94.9  --   --   PLT 155 170 210 226 221  --   --    Basic Metabolic Panel: Recent Labs  Lab 06/20/23 0214 06/21/23 0224 06/22/23 0210 06/23/23 0230 06/24/23 0301 06/24/23 0948 06/24/23 0950  NA 130* 131* 131* 132* 132* 136 135  K 4.4 4.5 4.3 4.5 4.1 4.1 4.2  CL 100 102 101 101 103  --   --   CO2 22 18* 24 21* 21*  --   --   GLUCOSE 121* 122* 120* 121* 123*  --   --   BUN 13 12 15 15 15   --   --   CREATININE 0.74 0.77 0.74 0.79 0.63  --   --   CALCIUM 8.3* 8.8* 8.7* 8.9 8.7*  --   --   MG 1.9 1.8 2.2 2.0 2.0  --   --   PHOS 3.8 3.9 4.5 3.8 4.0  --   --    GFR: Estimated Creatinine Clearance: 109.6 mL/min (by  C-G formula based on SCr of 0.63 mg/dL). Liver Function Tests: Recent Labs  Lab 06/20/23 0214 06/21/23 0224 06/22/23 0210 06/23/23 0230 06/24/23 0301  AST 50* 53* 68* 63* 84*  ALT 33 35 43 44 63*  ALKPHOS 48 56 58 55 72  BILITOT 0.5 0.5 0.5 0.5 0.3  PROT 6.8 6.8 7.2 7.4 7.2  ALBUMIN 2.3* 2.3* 2.4* 2.5* 2.5*   No results for input(s): "LIPASE", "AMYLASE" in the last 168 hours. No results for input(s): "AMMONIA" in the last 168 hours. Coagulation Profile: No results for input(s): "INR", "PROTIME" in the last 168 hours. Cardiac Enzymes: No results for input(s): "CKTOTAL", "CKMB", "CKMBINDEX", "TROPONINI" in the last 168 hours. BNP (last 3 results) No results for input(s): "PROBNP" in the last 8760 hours. HbA1C: No results for input(s): "HGBA1C" in the last 72 hours. CBG: No results for input(s): "GLUCAP" in the last 168 hours. Lipid Profile: No results for input(s): "CHOL", "HDL", "LDLCALC", "TRIG", "CHOLHDL", "LDLDIRECT" in the last 72 hours. Thyroid Function Tests: No results for input(s): "TSH", "T4TOTAL", "FREET4", "T3FREE", "THYROIDAB" in the last 72 hours. Anemia Panel: No results for input(s): "VITAMINB12", "FOLATE", "FERRITIN", "TIBC", "IRON", "RETICCTPCT" in the last 72 hours. Sepsis Labs: Recent Labs  Lab 06/19/23 0239  LATICACIDVEN 1.4   Recent Results (from the past 240 hour(s))  Culture, blood (  Routine X 2) w Reflex to ID Panel     Status: Abnormal   Collection Time: 06/19/23  2:39 AM   Specimen: BLOOD  Result Value Ref Range Status   Specimen Description BLOOD BLOOD LEFT ARM  Final   Special Requests   Final    BOTTLES DRAWN AEROBIC AND ANAEROBIC Blood Culture adequate volume   Culture  Setup Time   Final    GRAM POSITIVE COCCI IN CHAINS IN BOTH AEROBIC AND ANAEROBIC BOTTLES CRITICAL RESULT CALLED TO, READ BACK BY AND VERIFIED WITH: PHARMD JAMES LEDFORD ON 06/19/23 @ 2313 BY DRT Performed at [facility], [address] [place], [place]  [address]    Culture STREPTOCOCCUS SALIVARIUS (A)  Final   Report Status 06/22/2023 FINAL  Final   Organism ID, Bacteria STREPTOCOCCUS SALIVARIUS  Final      Susceptibility   Streptococcus salivarius - MIC*    PENICILLIN <=0.06 SENSITIVE Sensitive     CEFTRIAXONE <=0.12 SENSITIVE Sensitive     ERYTHROMYCIN <=0.12 SENSITIVE Sensitive     LEVOFLOXACIN 2 SENSITIVE Sensitive     VANCOMYCIN 0.5 SENSITIVE Sensitive     * STREPTOCOCCUS SALIVARIUS  Culture, blood (Routine X 2) w Reflex to ID Panel     Status: Abnormal   Collection Time: 06/19/23  2:41 AM   Specimen: BLOOD  Result Value Ref Range Status   Specimen Description BLOOD BLOOD RIGHT ARM  Final   Special Requests   Final    BOTTLES DRAWN AEROBIC AND ANAEROBIC Blood Culture adequate volume   Culture  Setup Time   Final    GRAM POSITIVE COCCI IN CHAINS IN BOTH AEROBIC AND ANAEROBIC BOTTLES CRITICAL RESULT CALLED TO, READ BACK BY AND VERIFIED WITH: PHARMD JAMES LEDFORD ON 06/19/23 @ 2313 BY DRT    Culture (A)  Final    STREPTOCOCCUS SALIVARIUS SUSCEPTIBILITIES PERFORMED ON PREVIOUS CULTURE WITHIN THE LAST 5 DAYS.    Report Status 06/22/2023 FINAL  Final  Blood Culture ID Panel (Reflexed)     Status: Abnormal   Collection Time: 06/19/23  2:41 AM  Result Value Ref Range Status   Enterococcus faecalis NOT DETECTED NOT DETECTED Final   Enterococcus Faecium NOT DETECTED NOT DETECTED Final   Listeria monocytogenes NOT DETECTED NOT DETECTED Final   Staphylococcus species NOT DETECTED NOT DETECTED Final   Staphylococcus aureus (BCID) NOT DETECTED NOT DETECTED Final   Staphylococcus epidermidis NOT DETECTED NOT DETECTED Final   Staphylococcus lugdunensis NOT DETECTED NOT DETECTED Final   Streptococcus species DETECTED (A) NOT DETECTED Final    Comment: Not Enterococcus species, Streptococcus agalactiae, Streptococcus pyogenes, or Streptococcus pneumoniae. CRITICAL RESULT CALLED TO, READ BACK BY AND VERIFIED WITH: PHARMD JAMES LEDFORD ON  06/19/23 @ 2313 BY DRT    Streptococcus agalactiae NOT DETECTED NOT DETECTED Final   Streptococcus pneumoniae NOT DETECTED NOT DETECTED Final   Streptococcus pyogenes NOT DETECTED NOT DETECTED Final   A.calcoaceticus-baumannii NOT DETECTED NOT DETECTED Final   Bacteroides fragilis NOT DETECTED NOT DETECTED Final   Enterobacterales NOT DETECTED NOT DETECTED Final   Enterobacter cloacae complex NOT DETECTED NOT DETECTED Final   Escherichia coli NOT DETECTED NOT DETECTED Final   Klebsiella aerogenes NOT DETECTED NOT DETECTED Final   Klebsiella oxytoca NOT DETECTED NOT DETECTED Final   Klebsiella pneumoniae NOT DETECTED NOT DETECTED Final   Proteus species NOT DETECTED NOT DETECTED Final   Salmonella species NOT DETECTED NOT DETECTED Final   Serratia marcescens NOT DETECTED NOT DETECTED Final   Haemophilus  influenzae NOT DETECTED NOT DETECTED Final   Neisseria meningitidis NOT DETECTED NOT DETECTED Final   Pseudomonas aeruginosa NOT DETECTED NOT DETECTED Final   Stenotrophomonas maltophilia NOT DETECTED NOT DETECTED Final   Candida albicans NOT DETECTED NOT DETECTED Final   Candida auris NOT DETECTED NOT DETECTED Final   Candida glabrata NOT DETECTED NOT DETECTED Final   Candida krusei NOT DETECTED NOT DETECTED Final   Candida parapsilosis NOT DETECTED NOT DETECTED Final   Candida tropicalis NOT DETECTED NOT DETECTED Final   Cryptococcus neoformans/gattii NOT DETECTED NOT DETECTED Final    Comment: Performed at [facility], [address], [place], [place] [address]  SARS Coronavirus 2 by RT PCR (hospital order, performed in [facility] hospital lab) *cepheid single result test* Anterior Nasal Swab     Status: None   Collection Time: 06/19/23  2:45 AM   Specimen: Anterior Nasal Swab  Result Value Ref Range Status   SARS Coronavirus 2 by RT PCR NEGATIVE NEGATIVE Final    Comment: Performed at [facility], [address], [place], [place] [address]  Respiratory (~20  pathogens) panel by PCR     Status: None   Collection Time: 06/19/23  2:45 AM   Specimen: Nasopharyngeal Swab; Respiratory  Result Value Ref Range Status   Adenovirus NOT DETECTED NOT DETECTED Final   Coronavirus 229E NOT DETECTED NOT DETECTED Final    Comment: (NOTE) The Coronavirus on the Respiratory Panel, DOES NOT test for the novel  Coronavirus (2019 nCoV)    Coronavirus HKU1 NOT DETECTED NOT DETECTED Final   Coronavirus NL63 NOT DETECTED NOT DETECTED Final   Coronavirus OC43 NOT DETECTED NOT DETECTED Final   Metapneumovirus NOT DETECTED NOT DETECTED Final   Rhinovirus / Enterovirus NOT DETECTED NOT DETECTED Final   Influenza A NOT DETECTED NOT DETECTED Final   Influenza B NOT DETECTED NOT DETECTED Final   Parainfluenza Virus 1 NOT DETECTED NOT DETECTED Final   Parainfluenza Virus 2 NOT DETECTED NOT DETECTED Final   Parainfluenza Virus 3 NOT DETECTED NOT DETECTED Final   Parainfluenza Virus 4 NOT DETECTED NOT DETECTED Final   Respiratory Syncytial Virus NOT DETECTED NOT DETECTED Final   Bordetella pertussis NOT DETECTED NOT DETECTED Final   Bordetella Parapertussis NOT DETECTED NOT DETECTED Final   Chlamydophila pneumoniae NOT DETECTED NOT DETECTED Final   Mycoplasma pneumoniae NOT DETECTED NOT DETECTED Final    Comment: Performed at [facility], [address], [place], [place] [address]  Culture, blood (Routine X 2) w Reflex to ID Panel     Status: None (Preliminary result)   Collection Time: 06/20/23  9:40 AM   Specimen: BLOOD LEFT ARM  Result Value Ref Range Status   Specimen Description BLOOD LEFT ARM  Final   Special Requests   Final    BOTTLES DRAWN AEROBIC AND ANAEROBIC Blood Culture adequate volume   Culture   Final    NO GROWTH 4 DAYS Performed at [facility], [address], [place], [place] [address]    Report Status PENDING  Incomplete  Culture, blood (Routine X 2) w Reflex to ID Panel     Status: None (Preliminary result)   Collection Time:  06/20/23  9:49 AM   Specimen: BLOOD RIGHT HAND  Result Value Ref Range Status   Specimen Description BLOOD RIGHT HAND  Final   Special Requests   Final    BOTTLES DRAWN AEROBIC AND ANAEROBIC Blood Culture adequate volume   Culture  Final    NO GROWTH 4 DAYS Performed at [facility], [address], [place], [place] [address]    Report Status PENDING  Incomplete    Radiology Studies: CARDIAC CATHETERIZATION  Result Date: 06/24/2023 Conclusions: Nonobstructive coronary artery disease, as detailed below, including sequential 20% and 20-30% proximal/mid LAD stenoses, 50-60% ostial D1 lesion, and mild plaquing of OM1 and mid RCA. Severely elevated left heart and pulmonary artery pressures (PCWP 34 mmHg, LVEDP 40 mmHg, mean PA 46 mmHg). Mildly elevated right heart filling pressures (mean RA 7 mmHg, RVEDP 9 mmHg). Normal Fick cardiac output/index (CO 6.4 L/min, CI 3.3 L/min/m^2). 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 Suspected acute allergic reaction to IV contrast treated with IV diphenhydramine and methylprednisolone. Recommendations: Escalate diuresis, as tolerated. 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 Yvonne Kendall, MD [facility]  [place] EKG SITE RITE  Result Date: 06/23/2023 If Site Rite image not attached, placement could not be confirmed due to current cardiac rhythm.   Scheduled Meds:  docusate sodium  100 mg Oral BID   [START ON 06/25/2023] enoxaparin (LOVENOX) injection  40 mg Subcutaneous Q24H   folic acid  1 mg Oral Daily   furosemide  40 mg Oral BID   gabapentin  300 mg Oral TID   methocarbamol  500 mg Oral TID   metoprolol tartrate  50 mg Oral BID   multivitamin with minerals  1 tablet Oral  Daily   pantoprazole  40 mg Oral Daily   potassium chloride  40 mEq Oral Daily   rosuvastatin  20 mg Oral Daily   sodium chloride flush  3 mL Intravenous Q12H   thiamine  100 mg Oral Daily   Continuous Infusions:  sodium chloride     penicillin G potassium 12 Million Units in dextrose 5 % 500 mL CONTINUOUS infusion      LOS: 6 days   Marguerita Merles, DO [facility] Available via Epic secure chat 7am-7pm After these hours, please refer to coverage provider listed on [email or web address] 06/24/2023, 4:05 PM

## 2023-06-24 NOTE — Plan of Care (Signed)

## 2023-06-24 NOTE — TOC Progression Note (Signed)
Transition of Care [facility]) - Progression Note    Patient Details  Name: Victor [name] MRN: [id number] Date of Birth: [birth year]  [facility]) CM/SW Contact  Leone Haven, RN Phone Number: 06/24/2023, 11:00 AM  Clinical Narrative:    May need long term iv abx, notified Jeri Modena to follow.          Expected Discharge Plan and Services                                               Social Determinants of Health (SDOH) Interventions SDOH Screenings   Food Insecurity: Food Insecurity Present (06/18/2023)  Housing: Medium Risk (06/18/2023)  Transportation Needs: Unmet Transportation Needs (06/18/2023)  Utilities: Not At Risk (06/18/2023)  Depression (PHQ2-9): Medium Risk (05/07/2023)  Tobacco Use: High Risk (06/21/2023)    Readmission Risk Interventions     No data to display

## 2023-06-25 ENCOUNTER — Inpatient Hospital Stay (HOSPITAL_COMMUNITY): Payer: Medicaid Other

## 2023-06-25 DIAGNOSIS — M4620 Osteomyelitis of vertebra, site unspecified: Secondary | ICD-10-CM

## 2023-06-25 DIAGNOSIS — I059 Rheumatic mitral valve disease, unspecified: Secondary | ICD-10-CM | POA: Diagnosis not present

## 2023-06-25 DIAGNOSIS — I330 Acute and subacute infective endocarditis: Secondary | ICD-10-CM | POA: Diagnosis not present

## 2023-06-25 DIAGNOSIS — R7881 Bacteremia: Secondary | ICD-10-CM | POA: Diagnosis not present

## 2023-06-25 DIAGNOSIS — R0602 Shortness of breath: Secondary | ICD-10-CM

## 2023-06-25 DIAGNOSIS — I10 Essential (primary) hypertension: Secondary | ICD-10-CM | POA: Diagnosis not present

## 2023-06-25 DIAGNOSIS — I5031 Acute diastolic (congestive) heart failure: Secondary | ICD-10-CM | POA: Diagnosis not present

## 2023-06-25 DIAGNOSIS — I340 Nonrheumatic mitral (valve) insufficiency: Secondary | ICD-10-CM | POA: Diagnosis not present

## 2023-06-25 DIAGNOSIS — M5416 Radiculopathy, lumbar region: Secondary | ICD-10-CM | POA: Diagnosis not present

## 2023-06-25 DIAGNOSIS — B955 Unspecified streptococcus as the cause of diseases classified elsewhere: Secondary | ICD-10-CM | POA: Diagnosis not present

## 2023-06-25 LAB — COMPREHENSIVE METABOLIC PANEL
ALT: 75 U/L — ABNORMAL HIGH (ref 0–44)
ALT: 111 U/L — ABNORMAL HIGH (ref 0–44)
AST: 94 U/L — ABNORMAL HIGH (ref 15–41)
AST: 139 U/L — ABNORMAL HIGH (ref 15–41)
Albumin: 2.4 g/dL — ABNORMAL LOW (ref 3.5–5.0)
Albumin: 2.7 g/dL — ABNORMAL LOW (ref 3.5–5.0)
Alkaline Phosphatase: 64 U/L (ref 38–126)
Alkaline Phosphatase: 72 U/L (ref 38–126)
Anion gap: 8 (ref 5–15)
Anion gap: 10 (ref 5–15)
BUN: 16 mg/dL (ref 6–20)
BUN: 15 mg/dL (ref 6–20)
CO2: 20 mmol/L — ABNORMAL LOW (ref 22–32)
CO2: 21 mmol/L — ABNORMAL LOW (ref 22–32)
Calcium: 8.5 mg/dL — ABNORMAL LOW (ref 8.9–10.3)
Calcium: 8.9 mg/dL (ref 8.9–10.3)
Chloride: 102 mmol/L (ref 98–111)
Chloride: 105 mmol/L (ref 98–111)
Creatinine, Ser: 0.83 mg/dL (ref 0.61–1.24)
Creatinine, Ser: 0.81 mg/dL (ref 0.61–1.24)
GFR, Estimated: >60 mL/min (ref >60)
GFR, Estimated: >60 mL/min (ref >60)
Glucose, Bld: 235 mg/dL — ABNORMAL HIGH (ref 70–99)
Glucose, Bld: 110 mg/dL — ABNORMAL HIGH (ref 70–99)
Potassium: 5.3 mmol/L — ABNORMAL HIGH (ref 3.5–5.1)
Potassium: 4.2 mmol/L (ref 3.5–5.1)
Sodium: 130 mmol/L — ABNORMAL LOW (ref 135–145)
Sodium: 136 mmol/L (ref 135–145)
Total Bilirubin: 0.4 mg/dL (ref <1.2)
Total Bilirubin: 0.6 mg/dL (ref <1.2)
Total Protein: 6.9 g/dL (ref 6.5–8.1)
Total Protein: 7.8 g/dL (ref 6.5–8.1)

## 2023-06-25 LAB — CBC WITH DIFFERENTIAL/PLATELET
Abs Immature Granulocytes: 0.07 10*3/uL (ref 0.00–0.07)
Basophils Absolute: 0 10*3/uL (ref 0.0–0.1)
Basophils Relative: 0 %
Eosinophils Absolute: 0 10*3/uL (ref 0.0–0.5)
Eosinophils Relative: 0 %
HCT: 21.5 % — ABNORMAL LOW (ref 39.0–52.0)
Hemoglobin: 6.6 g/dL — CL (ref 13.0–17.0)
Immature Granulocytes: 1 %
Lymphocytes Relative: 14 %
Lymphs Abs: 1.3 10*3/uL (ref 0.7–4.0)
MCH: 28.9 pg (ref 26.0–34.0)
MCHC: 30.7 g/dL (ref 30.0–36.0)
MCV: 94.3 fL (ref 80.0–100.0)
Monocytes Absolute: 0.8 10*3/uL (ref 0.1–1.0)
Monocytes Relative: 9 %
Neutro Abs: 7.2 10*3/uL (ref 1.7–7.7)
Neutrophils Relative %: 76 %
Platelets: 221 10*3/uL (ref 150–400)
RBC: 2.28 MIL/uL — ABNORMAL LOW (ref 4.22–5.81)
RDW: 15.4 % (ref 11.5–15.5)
WBC: 9.4 10*3/uL (ref 4.0–10.5)
nRBC: 0 % (ref 0.0–0.2)

## 2023-06-25 LAB — MAGNESIUM: Magnesium: 2 mg/dL (ref 1.7–2.4)

## 2023-06-25 LAB — CULTURE, BLOOD (ROUTINE X 2)
Culture: NO GROWTH
Culture: NO GROWTH
Special Requests: ADEQUATE
Special Requests: ADEQUATE

## 2023-06-25 LAB — HEMOGLOBIN AND HEMATOCRIT, BLOOD
HCT: 27.9 % — ABNORMAL LOW (ref 39.0–52.0)
Hemoglobin: 8.7 g/dL — ABNORMAL LOW (ref 13.0–17.0)

## 2023-06-25 LAB — PHOSPHORUS: Phosphorus: 3.7 mg/dL (ref 2.5–4.6)

## 2023-06-25 LAB — PREPARE RBC (CROSSMATCH)

## 2023-06-25 LAB — ABO/RH: ABO/RH(D): A POS

## 2023-06-25 MED ORDER — SODIUM CHLORIDE 0.9% IV SOLUTION: Freq: Once | INTRAVENOUS | Status: AC

## 2023-06-25 NOTE — Progress Notes (Signed)
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 To discharging provider:  please sign these orders via discharge navigator,  Select New Orders & click on the button choice - Manage This Unsigned Work.     Thank you for allowing pharmacy to be a part of this patient's care.  Georgina Pillion, PharmD, BCPS, BCIDP Infectious Diseases Clinical Pharmacist 06/26/2023 11:28 AM   **Pharmacist phone directory can now be found on [email or web address] ([email or web address]).  Listed under [facility].

## 2023-06-25 NOTE — Progress Notes (Signed)
[facility]   Reason for visit: Follow up on native mitral valve endocarditis  Interval History: repeat blood cultures from 11/28 remained negative and final; WBC 9.4; he has no complaints.   Penicillin sensitive and now on penicillin  Physical Exam: Constitutional:  Vitals:   06/25/23 1125 06/25/23 1128  BP: 118/74 118/74  Pulse: 77 77  Resp: 18 18  Temp: 98.1 F (36.7 C) 98.1 F (36.7 C)  SpO2:  98%   patient appears in NAD Respiratory: Normal respiratory effort  Review of Systems: Constitutional: negative for fevers and chills  Lab Results  Component Value Date   WBC 9.4 06/25/2023   HGB 6.6 (LL) 06/25/2023   HCT 21.5 (L) 06/25/2023   MCV 94.3 06/25/2023   PLT 221 06/25/2023    Lab Results  Component Value Date   CREATININE 0.83 06/25/2023   BUN 16 06/25/2023   NA 130 (L) 06/25/2023   K 5.3 (H) 06/25/2023   CL 102 06/25/2023   CO2 20 (L) 06/25/2023    Lab Results  Component Value Date   ALT 75 (H) 06/25/2023   AST 94 (H) 06/25/2023   GGT 630 (H) 01/09/2019   ALKPHOS 64 06/25/2023     Microbiology: Recent Results (from the past 240 hour(s))  Culture, blood (Routine X 2) w Reflex to ID Panel     Status: Abnormal   Collection Time: 06/19/23  2:39 AM   Specimen: BLOOD  Result Value Ref Range Status   Specimen Description BLOOD BLOOD LEFT ARM  Final   Special Requests   Final    BOTTLES DRAWN AEROBIC AND ANAEROBIC Blood Culture adequate volume   Culture  Setup Time   Final    GRAM POSITIVE COCCI IN CHAINS IN BOTH AEROBIC AND ANAEROBIC BOTTLES CRITICAL RESULT CALLED TO, READ BACK BY AND VERIFIED WITH: PHARMD JAMES LEDFORD ON 06/19/23 @ 2313 BY DRT Performed at [facility], [address], [place], [place] [address]    Culture STREPTOCOCCUS SALIVARIUS (A)  Final   Report Status 06/22/2023 FINAL  Final   Organism ID, Bacteria STREPTOCOCCUS SALIVARIUS  Final      Susceptibility   Streptococcus salivarius - MIC*     PENICILLIN <=0.06 SENSITIVE Sensitive     CEFTRIAXONE <=0.12 SENSITIVE Sensitive     ERYTHROMYCIN <=0.12 SENSITIVE Sensitive     LEVOFLOXACIN 2 SENSITIVE Sensitive     VANCOMYCIN 0.5 SENSITIVE Sensitive     * STREPTOCOCCUS SALIVARIUS  Culture, blood (Routine X 2) w Reflex to ID Panel     Status: Abnormal   Collection Time: 06/19/23  2:41 AM   Specimen: BLOOD  Result Value Ref Range Status   Specimen Description BLOOD BLOOD RIGHT ARM  Final   Special Requests   Final    BOTTLES DRAWN AEROBIC AND ANAEROBIC Blood Culture adequate volume   Culture  Setup Time   Final    GRAM POSITIVE COCCI IN CHAINS IN BOTH AEROBIC AND ANAEROBIC BOTTLES CRITICAL RESULT CALLED TO, READ BACK BY AND VERIFIED WITH: PHARMD JAMES LEDFORD ON 06/19/23 @ 2313 BY DRT    Culture (A)  Final    STREPTOCOCCUS SALIVARIUS SUSCEPTIBILITIES PERFORMED ON PREVIOUS CULTURE WITHIN THE LAST 5 DAYS.    Report Status 06/22/2023 FINAL  Final  Blood Culture ID Panel (Reflexed)     Status: Abnormal   Collection Time: 06/19/23  2:41 AM  Result Value Ref Range Status   Enterococcus faecalis NOT DETECTED NOT DETECTED Final  Enterococcus Faecium NOT DETECTED NOT DETECTED Final   Listeria monocytogenes NOT DETECTED NOT DETECTED Final   Staphylococcus species NOT DETECTED NOT DETECTED Final   Staphylococcus aureus (BCID) NOT DETECTED NOT DETECTED Final   Staphylococcus epidermidis NOT DETECTED NOT DETECTED Final   Staphylococcus lugdunensis NOT DETECTED NOT DETECTED Final   Streptococcus species DETECTED (A) NOT DETECTED Final    Comment: Not Enterococcus species, Streptococcus agalactiae, Streptococcus pyogenes, or Streptococcus pneumoniae. CRITICAL RESULT CALLED TO, READ BACK BY AND VERIFIED WITH: PHARMD JAMES LEDFORD ON 06/19/23 @ 2313 BY DRT    Streptococcus agalactiae NOT DETECTED NOT DETECTED Final   Streptococcus pneumoniae NOT DETECTED NOT DETECTED Final   Streptococcus pyogenes NOT DETECTED NOT DETECTED Final    A.calcoaceticus-baumannii NOT DETECTED NOT DETECTED Final   Bacteroides fragilis NOT DETECTED NOT DETECTED Final   Enterobacterales NOT DETECTED NOT DETECTED Final   Enterobacter cloacae complex NOT DETECTED NOT DETECTED Final   Escherichia coli NOT DETECTED NOT DETECTED Final   Klebsiella aerogenes NOT DETECTED NOT DETECTED Final   Klebsiella oxytoca NOT DETECTED NOT DETECTED Final   Klebsiella pneumoniae NOT DETECTED NOT DETECTED Final   Proteus species NOT DETECTED NOT DETECTED Final   Salmonella species NOT DETECTED NOT DETECTED Final   Serratia marcescens NOT DETECTED NOT DETECTED Final   Haemophilus influenzae NOT DETECTED NOT DETECTED Final   Neisseria meningitidis NOT DETECTED NOT DETECTED Final   Pseudomonas aeruginosa NOT DETECTED NOT DETECTED Final   Stenotrophomonas maltophilia NOT DETECTED NOT DETECTED Final   Candida albicans NOT DETECTED NOT DETECTED Final   Candida auris NOT DETECTED NOT DETECTED Final   Candida glabrata NOT DETECTED NOT DETECTED Final   Candida krusei NOT DETECTED NOT DETECTED Final   Candida parapsilosis NOT DETECTED NOT DETECTED Final   Candida tropicalis NOT DETECTED NOT DETECTED Final   Cryptococcus neoformans/gattii NOT DETECTED NOT DETECTED Final    Comment: Performed at [facility], [address], [place], [place] [address]  SARS Coronavirus 2 by RT PCR (hospital order, performed in [facility] hospital lab) *cepheid single result test* Anterior Nasal Swab     Status: None   Collection Time: 06/19/23  2:45 AM   Specimen: Anterior Nasal Swab  Result Value Ref Range Status   SARS Coronavirus 2 by RT PCR NEGATIVE NEGATIVE Final    Comment: Performed at [facility], [address], [place], [place] [address]  Respiratory (~20 pathogens) panel by PCR     Status: None   Collection Time: 06/19/23  2:45 AM   Specimen: Nasopharyngeal Swab; Respiratory  Result Value Ref Range Status   Adenovirus NOT DETECTED NOT DETECTED Final    Coronavirus 229E NOT DETECTED NOT DETECTED Final    Comment: (NOTE) The Coronavirus on the Respiratory Panel, DOES NOT test for the novel  Coronavirus (2019 nCoV)    Coronavirus HKU1 NOT DETECTED NOT DETECTED Final   Coronavirus NL63 NOT DETECTED NOT DETECTED Final   Coronavirus OC43 NOT DETECTED NOT DETECTED Final   Metapneumovirus NOT DETECTED NOT DETECTED Final   Rhinovirus / Enterovirus NOT DETECTED NOT DETECTED Final   Influenza A NOT DETECTED NOT DETECTED Final   Influenza B NOT DETECTED NOT DETECTED Final   Parainfluenza Virus 1 NOT DETECTED NOT DETECTED Final   Parainfluenza Virus 2 NOT DETECTED NOT DETECTED Final   Parainfluenza Virus 3 NOT DETECTED NOT DETECTED Final   Parainfluenza Virus 4 NOT DETECTED NOT DETECTED Final   Respiratory Syncytial Virus NOT DETECTED NOT DETECTED Final  Bordetella pertussis NOT DETECTED NOT DETECTED Final   Bordetella Parapertussis NOT DETECTED NOT DETECTED Final   Chlamydophila pneumoniae NOT DETECTED NOT DETECTED Final   Mycoplasma pneumoniae NOT DETECTED NOT DETECTED Final    Comment: Performed at [facility], [address], [place], [place] [address]  Culture, blood (Routine X 2) w Reflex to ID Panel     Status: None   Collection Time: 06/20/23  9:40 AM   Specimen: BLOOD LEFT ARM  Result Value Ref Range Status   Specimen Description BLOOD LEFT ARM  Final   Special Requests   Final    BOTTLES DRAWN AEROBIC AND ANAEROBIC Blood Culture adequate volume   Culture   Final    NO GROWTH 5 DAYS Performed at [facility], [address], [place], [place] [address]    Report Status 06/25/2023 FINAL  Final  Culture, blood (Routine X 2) w Reflex to ID Panel     Status: None   Collection Time: 06/20/23  9:49 AM   Specimen: BLOOD RIGHT HAND  Result Value Ref Range Status   Specimen Description BLOOD RIGHT HAND  Final   Special Requests   Final    BOTTLES DRAWN AEROBIC AND ANAEROBIC Blood Culture adequate volume   Culture   Final     NO GROWTH 5 DAYS Performed at [facility], [address], [place], [place] [address]    Report Status 06/25/2023 FINAL  Final    Impression/Plan:  1. 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  He is tolerating antibiotics well with no rash or diarrhea reported.   Will continue for a prolonged period.   TCTS following and plan for surgery at some point, likely inpatient  2.  Thrombocytopenia - this has resolved.    3.  Access - ok to place picc if needed for access and eventual discharge.    4.  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

## 2023-06-25 NOTE — Progress Notes (Signed)
PROGRESS NOTE    Victor [name]  ZOX:[id number] DOB: 09-04-1970 DOA: 06/17/2023 PCP: Claiborne Rigg, NP   Brief Narrative:  Patient is a 52 years old male with past medical history of hyperlipidemia, obesity, hypertension presented to hospital with exertional shortness of breath, bilateral lower extremity edema and pain for the last 2 weeks.  Patient also complains of shortness of breath at rest and has been having difficulty lying down in bed. 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  Cardiothoracic surgery was consulted and they feel that his valve will need to be replaced but want him to go under further testing for likely HOCM. Cardiac catheterization done and he had minimal nonobstructive CAD with elevated V waves and filling pressures as well as a subaortic gradient with increased PVCs suggesting potential hyperacute trophic cardiomyopathy.  Cardiac MRI is currently being done and pending read.  Assessment and Plan:  Shortness of Breath, Dyspnea on Exertion with lower extremity swelling in the setting of Acute on Chronic Diastolic CHF Moderate AS and Moderate MS with possible Systolic Anterior Motion Severe mitral valve regurgitation with a subaortic membrane with mitral valve chordal calcifications and systolic anterior motion the setting of SBE -Patient seems to be thinking that this is something new but did have a history of similar complaints in 2015 and had done 2D echocardiogram 2015 with LV function of 65%.  -BNP elevated at 592.9    -Received 40 mg of IV of Lasix in the ED and was on IV Lasix 40 mg BID but now  Cardiology holding IV diuresis and changing to po Lasix today ordered after overdiuresis with his HCM physiology however because of the shortness of breath the day before yesterday did give him a dose of IV Lasix 80 g x 1; Currently Cardiology recommending continuing po Lasix 40 mg Daily  -Strict I's and O's and daily weights  Intake/Output Summary (Last 24 hours) at 06/25/2023 1727 Last data filed at 06/25/2023 1439 Gross per 24 hour  Intake 1180 ml  Output 3205 ml  Net -2025 ml   -Echocardiogram done and showed an LVEF of 70 to 75% but did show concern for subvalvular obstruction with peak gradient as high as 117 mmHg with Valsalva.  Patient also noted to have moderate mitral stenosis with possible SAM and moderate aortic stenosis -Cardiology recommends continuing diuresis today but recommending that he undergo TEE for more definitive evaluation and we will attempt to placee the patient on the schedule for Friday -Given his concern for HCM they are going to add low-dose beta-blocker therapy with metoprolol tartrate 12.5 mg p.o. twice daily but this was uptitrated to 50 mg po BID and being continued. 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 Leafy Ro on Tuesday 12/10 (MVR/subaortic myectomy) but TCTS requiring improvement in his Hgb prior to then  Chest Discomfort, improved  -Associated with Spicy Food -Mild troponin elevation and flat.   -Likely nonspecific or secondary to heart failure. -EKG without any ischemic changes.   -Added  PPI with Pantoprazole 40 mg po daily -If necessary will place on Maalox. -Troponin I went from 60 -> 53 -> 63 -> 97 -> 162 -ECHO showed EF of 70-75% -Cardiology suspects that he has chest discomfort in the setting of his heart failure and recommending continuing po Diuresis for now    History of Hypertension -Continue to monitor closely.   -On Amlodipine and Losartan at home which have been held. -Cardiology added po Metoprolol Tartrate 50 mg BID -C/w IV Hydralazine 10 mg q4hprn HBP  -Continue to Monitor BP per Protocol -Last BP reading was 122/59   Fever in the setting of Streptococcal Salivarius Bacteremia and severe mitral valve regurgitation with mitral valve Vegetation  -Spiked a Temp of 102.8 on admission and continues to spike temperatures -Afebrile now but WBC is not elevated -WBC Trend: Recent Labs  Lab 06/19/23 0239 06/20/23 0214 06/21/23 0224 06/22/23 0210 06/23/23 0230 06/24/23 0301 06/25/23 0232  WBC 8.5 7.8 8.3 8.3 9.5 8.4 9.4  -Urinalysis was unremarkable -Blood Cx x2 done and showed GPC in Chains and was showing Streptococcal Species in 4/4 Cx's with Sensitivities pending; Repeat Blood Cx 06/20/23 showing NGTD at 5 Days -Respiratory virus panel via PCR is negative as well as SARS-CoV-2 and DG chest x-ray done and showed "Slightly more prominent interstitial markings at both lung bases could reflect mild interstitial edema or atypical infection." -Repeat CXR yesterday and showed no active Disease  -Empirically placed on IV antibiotics with cefepime and IV Vancomycin and IV Metronidazole; ID discontinued IV Metronidazole and IV Cefepime and continued IV Vancomycin but per discussion with the ID pharmacist on 06/24/2023 they will be de-escalating to PCN and recommending continuing for a prolonged period -De-escalate Abx as needed pending further culture results -Cardiology recommends a TEE to further evaluate his valvular and myocardial disease and also rule out infective  endocarditis as a source of his fevers as there was no obvious vegetations on TTE; TEE as above -Will get ID involved for further evaluation and management and defer further Abx management to them  -Given the patient has a streptococcal bacteremia with severe mitral regurgitation and mitral valve apparatus calcification of the subaortic membrane and concern for HOCM the patient will need surgery for his valve and plan is for next Tuesday 12/10 -Left Heart Cath done 12/2 and showed minimal nonobstructive CAD with elevated V waves and filling pressures he also had a severely elevated left heart and pulmonary artery pressure and mildly elevated right heart filling pressures with a normal Fick cardiac output index.  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  Metabolic Acidosis -Mild. CO2 is now 2-, AG is 8, and Chloride Level is 102 -Continue to Monitor and Trend and repeat CMP after blood has infused  HLD -C/w Rosuvastatin 20 mg po Daily but may need to hold if LFTs continue to worsen further   Abnormal LFTs, slight worsening further  -LFT Trend: Recent Labs  Lab 06/20/23 0214 06/21/23 0224 06/22/23 0210 06/23/23 0230 06/24/23 0301 06/25/23 0232  AST 50* 53* 68* 63* 84* 94*  ALT 33 35 43 44 63* 75*  -Continue to Monitor and Trend and if Necessary will obtain RUQ U/S and an Acute Hepatitis Panel in the AM -Repeat CMP in the AM   Hyponatremia -Uncertain etiology at this time  but likely in the setting of volume overload -Renal function within normal range.   -Checked TSH 1.163.   -Na+ Trend: Recent Labs  Lab 06/21/23 0224 06/22/23 0210 06/23/23 0230 06/24/23 0301 06/24/23 0948 06/24/23 0950 06/25/23 0232  NA 131* 131* 132* 132* 136 135 130*  -Could be related to drinking beer but will need close monitoring -Repeat CMP in the AM  Hyperkalemia -Patient's K+ Level Trend: Recent Labs  Lab 06/21/23 0224 06/22/23 0210 06/23/23 0230 06/24/23 0301 06/24/23 0948 06/24/23 0950 06/25/23 0232  K 4.5 4.3 4.5 4.1 4.1 4.2 5.3*  -Repeat BMP this afternoon pending -Continue to Monitor and Replete as Necessary -Repeat CMP in the AM    Alcohol Use Disorder -Drinks 16 ounce beer probably every day.  Denies any withdrawal symptoms.   -Placed patient on Thiamine 100 mg po Daily,  Folic Acid 1 ,g po daily and Multivitamin + Minerals 1 tab po Daily.   -Closely monitor for any withdrawal symptoms.  Prediabetes -HbA1c was 5.9 -Continue to Monitor CBGs and Glucose per protocol -CBG Trend: No results for input(s): "GLUCAP" in the last 720 hours. -Glucose Trend:  Recent Labs  Lab 06/19/23 0241 06/20/23 0214 06/21/23 0224 06/22/23 0210 06/23/23 0230 06/24/23 0301 06/25/23 0232  GLUCOSE 133* 121* 122* 120* 121* 123* 235*  -If necessary will place on Sensitive Novolog SSI AC/HS   Low Back Pain, Lumbar Radiculopathy  -Had seen a spine specialist as outpatient and MRI of the lumbar spine was ordered on 06/16/2025.   -Completed course of prednisone.   -Currently taking Tylenol for pain.   -Will need to follow-up with orthopedics as outpatient.   -Will get PT OT evaluation while in the hospital..   -Added Oxycodone IR 5 mg po q4hprn and will continue  -Continue Gabapentin 300 mg po TID and muscle relaxants with Methocarbamol 500 mg po TID -Patient had an outpatient MRI on 06/17/2023 which has not yet resulted and I called yesterday to get it read as a Priority  and still has not been read yet.  -ID repeated MRI Lumbar Spine with Contrast and showed "Reactive endplate change with endplate enhancement about the L5-S1 interspace, favored to be degenerative in nature, although possible changes of infection are difficult to exclude, and could be considered in the correct clinical setting. If the clinical picture is equivocal for spinal infection, a short interval follow-up exam to evaluate for interval changes may be helpful for further evaluation as warranted. No other evidence for acute or active infection elsewhere within the lumbar spine. Advanced degenerative disc disease at L5-S1 with resultant mild bilateral L5 foraminal stenosis. Additional mild spondylosis elsewhere within the lumbar spine as above. No other significant stenosis or impingement." -I spoke with Verlin Dike of Neurosurgery and the neurosurgery team is evaluated the imaging and feels that no neurosurgical intervention is needed and they are just recommending continuing antibiotics but if necessary recommending getting IR to see if they can  drain her biopsy but they feel it would not change management  Normocytic Anemia with Acute Drop -Hgb/Hct Trend: Recent Labs  Lab 06/21/23 0224 06/22/23 0210 06/23/23 0230 06/24/23 0301 06/24/23 0948 06/24/23 0950 06/25/23 0232  HGB 7.4* 7.6* 7.5* 7.3* 7.8* 7.8* 6.6*  HCT 24.1* 23.8* 24.0* 24.0* 23.0* 23.0* 21.5*  MCV 94.9 94.8 93.8 94.9  --   --  94.3  -FOBT negative but reported Dark Stools so will repeat; Continue PPI with Pantoprazole -Will be getting typed and screened and transfused 1 unit of pRBCs his acute drop in hemoglobin -Hold ASA for now as well as NSAIDs -Anemia panel was done and showed an iron level of 18, UIBC of 237, TIBC 255, saturation ratios of 7%, ferritin of 248, folate level 9.8, vitamin B12 level 540 -Continue to Monitor for S/Sx of Bleeding; no overt bleeding noted -Repeat CBC in the AM  Hypoalbuminemia -Patient's  Albumin Trend: Recent Labs  Lab 06/20/23 0214 06/21/23 0224 06/22/23 0210 06/23/23 0230 06/24/23 0301 06/25/23 0232  ALBUMIN 2.3* 2.3* 2.4* 2.5* 2.5* 2.4*  -Continue to Monitor and Trend and repeat CMP in the AM  Overweight -Complicates overall prognosis and care -Estimated body mass index is 29.94 kg/m as calculated from the following:   Height as of this encounter: 5\' 7"  (1.702 m).   Weight as of this encounter: 86.7 kg.  -Weight Loss and Dietary Counseling given   DVT prophylaxis: enoxaparin (LOVENOX) injection 40 mg Start: 06/25/23 0800 SCDs Start: 06/18/23 0858    Code Status: Full Code Family Communication: Discussed with family at bedside  Disposition Plan:  Level of care: Telemetry Cardiac Status is: Inpatient Remains inpatient appropriate because: Needs further clinical improvement and clearance by specialist as he is undergoing surgical intervention next week   Consultants:  Cardiology Infectious Diseases Cardiothoracic Surgery Discussed with Neurosurgery  Procedures:  TRANSTHORACIC ECHOCARDIOGRAM IMPRESSIONS     1. Left ventricular ejection fraction, by estimation, is 70 to 75%. The  left ventricle has hyperdynamic function. The LV outflow tract is  narrowed. There is a mid-cavity gradient peak 48 mmHg. There appears to be  an LV outflow peak gradient as high as  117 mmHg with Valsalva. Possible mild systolic anterior motion of the  mitral valve. The left ventricle has no regional wall motion  abnormalities. There is mild concentric left ventricular hypertrophy. Left  ventricular diastolic parameters are consistent  with Grade II diastolic dysfunction (pseudonormalization).   2. Right ventricular systolic function is normal. The right ventricular  size is normal. Tricuspid regurgitation signal is inadequate for assessing  PA pressure.   3. Left atrial size was moderately dilated.   4. The mitral valve is abnormal. The valve is moderately calcified and   restricted with calcification of the chords. Possible mild SAM but cannot  be definitive. Trivial mitral valve regurgitation. Probably moderate  mitral stenosis. The mean mitral valve  gradient is 11.0 mmHg.   5. The aortic valve was not well visualized. Aortic valve regurgitation  is not visualized. Moderate aortic valve stenosis. Aortic valve mean  gradient measures 22.0 mmHg.   6. The inferior vena cava is normal in size with <50% respiratory  variability, suggesting right atrial pressure of 8 mmHg.   7. Cannot rule out PFO vs small ASD (possible color flow across septum).   8. This patient needs a TEE. There are a number of abnormalities that are  difficult to work out by TTE. He has a hyperdynamic left ventricle with a  mid-cavity  gradient. The LVOT is also significantly narrowed, possibly  with SAM, with very high LVOT  gradient with valsalva. 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 However, the mitral valve and aortic valve themselves are  both calcified and thickened and appear to have moderate stenosis.   FINDINGS   Left Ventricle: Left ventricular ejection fraction, by estimation, is 70  to 75%. The left ventricle has hyperdynamic function. The left ventricle  has no regional wall motion abnormalities. The left ventricular internal  cavity size was normal in size.  There is mild concentric left ventricular hypertrophy. Left ventricular  diastolic parameters are consistent with Grade II diastolic dysfunction  (pseudonormalization).   Right Ventricle: The right ventricular size is normal. No increase in  right ventricular wall thickness. Right ventricular systolic function is  normal. Tricuspid regurgitation signal is inadequate for assessing PA  pressure.   Left Atrium: Left atrial size was moderately dilated.   Right Atrium: Right atrial size was normal in size.   Pericardium: Trivial pericardial effusion is present.   Mitral Valve: The mitral valve is  abnormal. There is moderate  calcification of the mitral valve leaflet(s). Moderate mitral annular  calcification. Trivial mitral valve regurgitation. Moderate mitral valve  stenosis. MV peak gradient, 19.0 mmHg. The mean  mitral valve gradient is 11.0 mmHg.   Tricuspid Valve: The tricuspid valve is normal in structure. Tricuspid  valve regurgitation is not demonstrated.   Aortic Valve: The aortic valve was not well visualized. Aortic valve  regurgitation is not visualized. Moderate aortic stenosis is present.  Aortic valve mean gradient measures 22.0 mmHg. Aortic valve peak gradient  measures 37.2 mmHg.   Pulmonic Valve: The pulmonic valve was normal in structure. Pulmonic valve  regurgitation is not visualized.   Aorta: The aortic root is normal in size and structure.   Venous: The inferior vena cava is normal in size with less than 50%  respiratory variability, suggesting right atrial pressure of 8 mmHg.   IAS/Shunts: Cannot rule out PFO vs small ASD (possible color flow across  septum).     LEFT VENTRICLE  PLAX 2D  LVIDd:         4.30 cm   Diastology  LVIDs:         2.70 cm   LV e' medial:    7.46 cm/s  LV PW:         1.20 cm   LV E/e' medial:  28.2  LV IVS:        1.00 cm   LV e' lateral:   9.48 cm/s  LVOT diam:     2.00 cm   LV E/e' lateral: 22.2  LVOT Area:     3.14 cm     RIGHT VENTRICLE             IVC  RV Basal diam:  2.60 cm     IVC diam: 2.10 cm  RV S prime:     16.00 cm/s  TAPSE (M-mode): 2.5 cm   LEFT ATRIUM             Index        RIGHT ATRIUM           Index  LA diam:        4.50 cm 2.27 cm/m   RA Area:     14.70 cm  LA Vol (A2C):   86.4 ml 43.66 ml/m  RA Volume:   36.50 ml  18.45 ml/m  LA Vol (A4C):  96.0 ml 48.51 ml/m  LA Biplane Vol: 92.2 ml 46.59 ml/m   AORTIC VALVE  AV Vmax:      305.00 cm/s  AV Vmean:     221.000 cm/s  AV VTI:       0.471 m  AV Peak Grad: 37.2 mmHg  AV Mean Grad: 22.0 mmHg    AORTA  Ao Root diam: 2.70 cm  Ao Asc  diam:  2.80 cm   MITRAL VALVE  MV Area (PHT): 3.46 cm     SHUNTS  MV Peak grad:  19.0 mmHg    Systemic Diam: 2.00 cm  MV Mean grad:  11.0 mmHg  MV Vmax:       2.18 m/s  MV Vmean:      156.0 cm/s  MV Decel Time: 219 msec  MV E velocity: 210.00 cm/s  MV A velocity: 162.00 cm/s  MV E/A ratio:  1.30    TRANSESOPHAGEAL ECHOCARDIOGRAM KEY FINDINGS:   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  Normal LV/RV function.  Full report to follow. Further management per primary team.    RIGHT AND LEFT CARDIAC CATHETERIZATION AND CORONARY ANGIOGRAPHY Conclusions: Nonobstructive coronary artery disease, as detailed below, including sequential 20% and 20-30% proximal/mid LAD stenoses, 50-60% ostial D1 lesion, and mild plaquing of OM1 and mid RCA. Severely elevated left heart and pulmonary artery pressures (PCWP 34 mmHg, LVEDP 40 mmHg, mean PA 46 mmHg). Mildly elevated right heart filling pressures (mean RA 7 mmHg, RVEDP 9 mmHg). Normal Fick cardiac output/index (CO 6.4 L/min, CI 3.3 L/min/m^2). 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 Suspected acute allergic reaction to IV contrast treated with IV diphenhydramine and methylprednisolone.   Recommendations: Escalate diuresis, as tolerated. 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   CAROTID DUPLEX Summary:  Right Carotid: Velocities in the right ICA are consistent with a 1-39%  stenosis.                The ECA appears <50% stenosed.   Left Carotid: Velocities in the left ICA are consistent with a 1-39%  stenosis.                The ECA appears <50% stenosed.   Vertebrals:  Bilateral vertebral arteries demonstrate antegrade flow.  Subclavians: Normal flow hemodynamics were seen in bilateral subclavian               arteries.   *See table(s) above for measurements and observations.    Antimicrobials:  Anti-infectives (From admission, onward)    Start     Dose/Rate Route Frequency Ordered Stop   06/24/23 2200  penicillin G potassium 12 Million Units in dextrose 5 % 500 mL CONTINUOUS infusion        12 Million Units 41.7 mL/hr over 12 Hours Intravenous Every 12 hours 06/24/23 1522     06/21/23 1115  cefTRIAXone (ROCEPHIN) 2 g in sodium chloride 0.9 % 100 mL IVPB  Status:  Discontinued        2 g 200 mL/hr over 30 Minutes Intravenous Every 24 hours 06/21/23 1017 06/24/23 1522   06/19/23 1600  vancomycin (VANCOREADY) IVPB 1250 mg/250 mL  Status:  Discontinued  1,250 mg 166.7 mL/hr over 90 Minutes Intravenous Every 12 hours 06/19/23 0323 06/21/23 1017   06/19/23 1200  ceFEPIme (MAXIPIME) 2 g in sodium chloride 0.9 % 100 mL IVPB  Status:  Discontinued        2 g 200 mL/hr over 30 Minutes Intravenous Every 8 hours 06/19/23 0323 06/20/23 1156   06/19/23 0145  vancomycin (VANCOREADY) IVPB 1750 mg/350 mL        1,750 mg 175 mL/hr over 120 Minutes Intravenous  Once 06/19/23 0051 06/19/23 1044   06/19/23 0145  ceFEPIme (MAXIPIME) 2 g in sodium chloride 0.9 % 100 mL IVPB        2 g 200 mL/hr over 30 Minutes Intravenous  Once 06/19/23 0051 06/19/23 0908   06/19/23 0045  metroNIDAZOLE (FLAGYL) IVPB 500 mg  Status:  Discontinued        500 mg 100 mL/hr over 60 Minutes Intravenous Every 12 hours 06/19/23 0041 06/20/23 1156       Subjective: Seen examined at bedside and was doing okay.  Blood count had dropped for unclear reasons still he has been typed and screened and transfused 1 unit of PRBCs.  No nausea or vomiting.  Feels okay.  No other concerns or points this time.  Objective: Vitals:   06/25/23  1125 06/25/23 1128 06/25/23 1220 06/25/23 1421  BP: 118/74 118/74 127/68 118/69  Pulse: 77 77 76 78  Resp: 18 18 20 19   Temp: 98.1 F (36.7 C) 98.1 F (36.7 C) 97.7 F (36.5 C) 98.5 F (36.9 C)  TempSrc:  Oral Oral Oral  SpO2:  98% 100% 99%  Weight:      Height:        Intake/Output Summary (Last 24 hours) at 06/25/2023 1742 Last data filed at 06/25/2023 1439 Gross per 24 hour  Intake 1180 ml  Output 3205 ml  Net -2025 ml   Filed Weights   06/23/23 0608 06/24/23 0330 06/25/23 0520  Weight: 84.7 kg 84.3 kg 86.7 kg   Examination: Physical Exam:  Constitutional: WN/WD African-American male in no acute distress Respiratory: Diminished to auscultation bilaterally with some coarse breath sounds, no wheezing, rales, rhonchi or crackles. Normal respiratory effort and patient is not tachypenic. No accessory muscle use.  Unlabored breathing Cardiovascular: RRR, has a of 6 systolic murmur noted but no appreciable lower extremity edema today Abdomen: Soft, non-tender, distended secondary to body habitus. Bowel sounds positive.  GU: Deferred. Musculoskeletal: No clubbing / cyanosis of digits/nails. No joint deformity upper and lower extremities. Skin: No rashes, lesions, ulcers on limited skin evaluation.. No induration; Warm and dry.  Neurologic: CN 2-12 grossly intact with no focal deficits. Romberg sign and cerebellar reflexes not assessed.  Psychiatric: Normal judgment and insight. Alert and oriented x 3. Normal mood and appropriate affect.   Data Reviewed: I have personally reviewed following labs and imaging studies  CBC: Recent Labs  Lab 06/21/23 0224 06/22/23 0210 06/23/23 0230 06/24/23 0301 06/24/23 0948 06/24/23 0950 06/25/23 0232  WBC 8.3 8.3 9.5 8.4  --   --  9.4  NEUTROABS 5.8 5.7 6.5 5.7  --   --  7.2  HGB 7.4* 7.6* 7.5* 7.3* 7.8* 7.8* 6.6*  HCT 24.1* 23.8* 24.0* 24.0* 23.0* 23.0* 21.5*  MCV 94.9 94.8 93.8 94.9  --   --  94.3  PLT 170 210 226 221  --   --  221    Basic Metabolic Panel: Recent Labs  Lab 06/21/23 0224 06/22/23 0210 06/23/23 0230 06/24/23 0301  06/24/23 0948 06/24/23 0950 06/25/23 0232  NA 131* 131* 132* 132* 136 135 130*  K 4.5 4.3 4.5 4.1 4.1 4.2 5.3*  CL 102 101 101 103  --   --  102  CO2 18* 24 21* 21*  --   --  20*  GLUCOSE 122* 120* 121* 123*  --   --  235*  BUN 12 15 15 15   --   --  16  CREATININE 0.77 0.74 0.79 0.63  --   --  0.83  CALCIUM 8.8* 8.7* 8.9 8.7*  --   --  8.5*  MG 1.8 2.2 2.0 2.0  --   --  2.0  PHOS 3.9 4.5 3.8 4.0  --   --  3.7   GFR: Estimated Creatinine Clearance: 106.9 mL/min (by C-G formula based on SCr of 0.83 mg/dL). Liver Function Tests: Recent Labs  Lab 06/21/23 0224 06/22/23 0210 06/23/23 0230 06/24/23 0301 06/25/23 0232  AST 53* 68* 63* 84* 94*  ALT 35 43 44 63* 75*  ALKPHOS 56 58 55 72 64  BILITOT 0.5 0.5 0.5 0.3 0.4  PROT 6.8 7.2 7.4 7.2 6.9  ALBUMIN 2.3* 2.4* 2.5* 2.5* 2.4*   No results for input(s): "LIPASE", "AMYLASE" in the last 168 hours. No results for input(s): "AMMONIA" in the last 168 hours. Coagulation Profile: No results for input(s): "INR", "PROTIME" in the last 168 hours. Cardiac Enzymes: No results for input(s): "CKTOTAL", "CKMB", "CKMBINDEX", "TROPONINI" in the last 168 hours. BNP (last 3 results) No results for input(s): "PROBNP" in the last 8760 hours. HbA1C: No results for input(s): "HGBA1C" in the last 72 hours. CBG: No results for input(s): "GLUCAP" in the last 168 hours. Lipid Profile: No results for input(s): "CHOL", "HDL", "LDLCALC", "TRIG", "CHOLHDL", "LDLDIRECT" in the last 72 hours. Thyroid Function Tests: No results for input(s): "TSH", "T4TOTAL", "FREET4", "T3FREE", "THYROIDAB" in the last 72 hours. Anemia Panel: No results for input(s): "VITAMINB12", "FOLATE", "FERRITIN", "TIBC", "IRON", "RETICCTPCT" in the last 72 hours. Sepsis Labs: Recent Labs  Lab 06/19/23 0239  LATICACIDVEN 1.4   Recent Results (from the past 240 hour(s))   Culture, blood (Routine X 2) w Reflex to ID Panel     Status: Abnormal   Collection Time: 06/19/23  2:39 AM   Specimen: BLOOD  Result Value Ref Range Status   Specimen Description BLOOD BLOOD LEFT ARM  Final   Special Requests   Final    BOTTLES DRAWN AEROBIC AND ANAEROBIC Blood Culture adequate volume   Culture  Setup Time   Final    GRAM POSITIVE COCCI IN CHAINS IN BOTH AEROBIC AND ANAEROBIC BOTTLES CRITICAL RESULT CALLED TO, READ BACK BY AND VERIFIED WITH: PHARMD JAMES LEDFORD ON 06/19/23 @ 2313 BY DRT Performed at [facility], [address] [place], [place] [address]    Culture STREPTOCOCCUS SALIVARIUS (A)  Final   Report Status 06/22/2023 FINAL  Final   Organism ID, Bacteria STREPTOCOCCUS SALIVARIUS  Final      Susceptibility   Streptococcus salivarius - MIC*    PENICILLIN <=0.06 SENSITIVE Sensitive     CEFTRIAXONE <=0.12 SENSITIVE Sensitive     ERYTHROMYCIN <=0.12 SENSITIVE Sensitive     LEVOFLOXACIN 2 SENSITIVE Sensitive     VANCOMYCIN 0.5 SENSITIVE Sensitive     * STREPTOCOCCUS SALIVARIUS  Culture, blood (Routine X 2) w Reflex to ID Panel     Status: Abnormal   Collection Time: 06/19/23  2:41 AM   Specimen: BLOOD  Result Value Ref Range  Status   Specimen Description BLOOD BLOOD RIGHT ARM  Final   Special Requests   Final    BOTTLES DRAWN AEROBIC AND ANAEROBIC Blood Culture adequate volume   Culture  Setup Time   Final    GRAM POSITIVE COCCI IN CHAINS IN BOTH AEROBIC AND ANAEROBIC BOTTLES CRITICAL RESULT CALLED TO, READ BACK BY AND VERIFIED WITH: PHARMD JAMES LEDFORD ON 06/19/23 @ 2313 BY DRT    Culture (A)  Final    STREPTOCOCCUS SALIVARIUS SUSCEPTIBILITIES PERFORMED ON PREVIOUS CULTURE WITHIN THE LAST 5 DAYS.    Report Status 06/22/2023 FINAL  Final  Blood Culture ID Panel (Reflexed)     Status: Abnormal   Collection Time: 06/19/23  2:41 AM  Result Value Ref Range Status   Enterococcus faecalis NOT DETECTED NOT DETECTED Final   Enterococcus Faecium NOT  DETECTED NOT DETECTED Final   Listeria monocytogenes NOT DETECTED NOT DETECTED Final   Staphylococcus species NOT DETECTED NOT DETECTED Final   Staphylococcus aureus (BCID) NOT DETECTED NOT DETECTED Final   Staphylococcus epidermidis NOT DETECTED NOT DETECTED Final   Staphylococcus lugdunensis NOT DETECTED NOT DETECTED Final   Streptococcus species DETECTED (A) NOT DETECTED Final    Comment: Not Enterococcus species, Streptococcus agalactiae, Streptococcus pyogenes, or Streptococcus pneumoniae. CRITICAL RESULT CALLED TO, READ BACK BY AND VERIFIED WITH: PHARMD JAMES LEDFORD ON 06/19/23 @ 2313 BY DRT    Streptococcus agalactiae NOT DETECTED NOT DETECTED Final   Streptococcus pneumoniae NOT DETECTED NOT DETECTED Final   Streptococcus pyogenes NOT DETECTED NOT DETECTED Final   A.calcoaceticus-baumannii NOT DETECTED NOT DETECTED Final   Bacteroides fragilis NOT DETECTED NOT DETECTED Final   Enterobacterales NOT DETECTED NOT DETECTED Final   Enterobacter cloacae complex NOT DETECTED NOT DETECTED Final   Escherichia coli NOT DETECTED NOT DETECTED Final   Klebsiella aerogenes NOT DETECTED NOT DETECTED Final   Klebsiella oxytoca NOT DETECTED NOT DETECTED Final   Klebsiella pneumoniae NOT DETECTED NOT DETECTED Final   Proteus species NOT DETECTED NOT DETECTED Final   Salmonella species NOT DETECTED NOT DETECTED Final   Serratia marcescens NOT DETECTED NOT DETECTED Final   Haemophilus influenzae NOT DETECTED NOT DETECTED Final   Neisseria meningitidis NOT DETECTED NOT DETECTED Final   Pseudomonas aeruginosa NOT DETECTED NOT DETECTED Final   Stenotrophomonas maltophilia NOT DETECTED NOT DETECTED Final   Candida albicans NOT DETECTED NOT DETECTED Final   Candida auris NOT DETECTED NOT DETECTED Final   Candida glabrata NOT DETECTED NOT DETECTED Final   Candida krusei NOT DETECTED NOT DETECTED Final   Candida parapsilosis NOT DETECTED NOT DETECTED Final   Candida tropicalis NOT DETECTED NOT  DETECTED Final   Cryptococcus neoformans/gattii NOT DETECTED NOT DETECTED Final    Comment: Performed at [facility], [address], [place], [place] [address]  SARS Coronavirus 2 by RT PCR (hospital order, performed in [facility] hospital lab) *cepheid single result test* Anterior Nasal Swab     Status: None   Collection Time: 06/19/23  2:45 AM   Specimen: Anterior Nasal Swab  Result Value Ref Range Status   SARS Coronavirus 2 by RT PCR NEGATIVE NEGATIVE Final    Comment: Performed at [facility] Lab, [address], [place], [place] [address]  Respiratory (~20 pathogens) panel by PCR     Status: None   Collection Time: 06/19/23  2:45 AM   Specimen: Nasopharyngeal Swab; Respiratory  Result Value Ref Range Status   Adenovirus NOT DETECTED NOT DETECTED Final   Coronavirus 229E  NOT DETECTED NOT DETECTED Final    Comment: (NOTE) The Coronavirus on the Respiratory Panel, DOES NOT test for the novel  Coronavirus (2019 nCoV)    Coronavirus HKU1 NOT DETECTED NOT DETECTED Final   Coronavirus NL63 NOT DETECTED NOT DETECTED Final   Coronavirus OC43 NOT DETECTED NOT DETECTED Final   Metapneumovirus NOT DETECTED NOT DETECTED Final   Rhinovirus / Enterovirus NOT DETECTED NOT DETECTED Final   Influenza A NOT DETECTED NOT DETECTED Final   Influenza B NOT DETECTED NOT DETECTED Final   Parainfluenza Virus 1 NOT DETECTED NOT DETECTED Final   Parainfluenza Virus 2 NOT DETECTED NOT DETECTED Final   Parainfluenza Virus 3 NOT DETECTED NOT DETECTED Final   Parainfluenza Virus 4 NOT DETECTED NOT DETECTED Final   Respiratory Syncytial Virus NOT DETECTED NOT DETECTED Final   Bordetella pertussis NOT DETECTED NOT DETECTED Final   Bordetella Parapertussis NOT DETECTED NOT DETECTED Final   Chlamydophila pneumoniae NOT DETECTED NOT DETECTED Final   Mycoplasma pneumoniae NOT DETECTED NOT DETECTED Final    Comment: Performed at [facility], [address] [place], [place] [address]  Culture,  blood (Routine X 2) w Reflex to ID Panel     Status: None   Collection Time: 06/20/23  9:40 AM   Specimen: BLOOD LEFT ARM  Result Value Ref Range Status   Specimen Description BLOOD LEFT ARM  Final   Special Requests   Final    BOTTLES DRAWN AEROBIC AND ANAEROBIC Blood Culture adequate volume   Culture   Final    NO GROWTH 5 DAYS Performed at [facility], [address] [place], [place] [address]    Report Status 06/25/2023 FINAL  Final  Culture, blood (Routine X 2) w Reflex to ID Panel     Status: None   Collection Time: 06/20/23  9:49 AM   Specimen: BLOOD RIGHT HAND  Result Value Ref Range Status   Specimen Description BLOOD RIGHT HAND  Final   Special Requests   Final    BOTTLES DRAWN AEROBIC AND ANAEROBIC Blood Culture adequate volume   Culture   Final    NO GROWTH 5 DAYS Performed at [facility] Lab, [address] [place], [place] [address]    Report Status 06/25/2023 FINAL  Final    Radiology Studies: VAS US CAROTID  Result Date: 06/25/2023 Carotid Arterial Duplex Study Patient Name:  PEYTON FRIGO  Date of Exam:   06/25/2023 Medical Rec #: [id number]    Accession #:    [id number] Date of Birth: 06-03-1971     Patient Gender: M Patient Age:   48 years Exam Location:  [facility] Procedure:      VAS US CAROTID Referring Phys: Cristal Deer END --------------------------------------------------------------------------------  Indications:       Pre-op, severe mitral regurgitation. Risk Factors:      Hypertension, hyperlipidemia, current smoker. Other Factors:     Obesity. Comparison Study:  No prior exam. Performing Technologist: Fernande Bras  Examination Guidelines: A complete evaluation includes B-mode imaging, spectral Doppler, color Doppler, and power Doppler as needed of all accessible portions of each vessel. Bilateral testing is considered an integral part of a complete examination. Limited examinations for reoccurring indications may be performed as noted.   Right Carotid Findings: +----------+--------+--------+--------+------------------+--------+           PSV cm/sEDV cm/sStenosisPlaque DescriptionComments +----------+--------+--------+--------+------------------+--------+ CCA Prox  131     22                                         +----------+--------+--------+--------+------------------+--------+  CCA Distal96      20                                         +----------+--------+--------+--------+------------------+--------+ ICA Prox  72      25              heterogenous               +----------+--------+--------+--------+------------------+--------+ ICA Mid   105     43                                         +----------+--------+--------+--------+------------------+--------+ ICA Distal100     37                                         +----------+--------+--------+--------+------------------+--------+ ECA       103     11                                         +----------+--------+--------+--------+------------------+--------+ +----------+--------+-------+--------+-------------------+           PSV cm/sEDV cmsDescribeArm Pressure (mmHG) +----------+--------+-------+--------+-------------------+ [id number]     31                                 +----------+--------+-------+--------+-------------------+ +---------+--------+--+--------+-+ VertebralPSV cm/s38EDV cm/s8 +---------+--------+--+--------+-+  Left Carotid Findings: +----------+--------+--------+--------+------------------+--------+           PSV cm/sEDV cm/sStenosisPlaque DescriptionComments +----------+--------+--------+--------+------------------+--------+ CCA Prox  111     22                                         +----------+--------+--------+--------+------------------+--------+ CCA Distal100     20                                         +----------+--------+--------+--------+------------------+--------+ ICA  Prox  85      22              smooth                     +----------+--------+--------+--------+------------------+--------+ ICA Mid   96      35                                         +----------+--------+--------+--------+------------------+--------+ ICA Distal84      26                                         +----------+--------+--------+--------+------------------+--------+ ECA       99      13                                         +----------+--------+--------+--------+------------------+--------+ +----------+--------+--------+--------+-------------------+  PSV cm/sEDV cm/sDescribeArm Pressure (mmHG) +----------+--------+--------+--------+-------------------+ Subclavian165     27                                  +----------+--------+--------+--------+-------------------+ +---------+--------+--+--------+--+ VertebralPSV cm/s57EDV cm/s18 +---------+--------+--+--------+--+   Summary: Right Carotid: Velocities in the right ICA are consistent with a 1-39% stenosis.                The ECA appears <50% stenosed. Left Carotid: Velocities in the left ICA are consistent with a 1-39% stenosis.               The ECA appears <50% stenosed. Vertebrals:  Bilateral vertebral arteries demonstrate antegrade flow. Subclavians: Normal flow hemodynamics were seen in bilateral subclavian              arteries. *See table(s) above for measurements and observations.  Electronically signed by Lemar Livings MD on 06/25/2023 at 5:17:02 PM.    Final    MR CARDIAC VELOCITY FLOW MAP  Result Date: 06/24/2023 CLINICAL DATA:  Hypertrophic cardiomyopathy EXAM: CARDIAC MRI TECHNIQUE: The patient was scanned on a 1.5 Tesla GE magnet. A dedicated cardiac coil was used. Functional imaging was done using Fiesta sequences. 2,3, and 4 chamber views were done to assess for RWMA's. Modified Simpson's rule using a short axis stack was used to calculate an ejection fraction on a dedicated work  [employer], [employer]. The patient received 10 cc of Gadavist. After 10 minutes inversion recovery sequences were used to assess for infiltration and scar tissue. FINDINGS: Limited images of the lung fields showed trivial bilateral pleural effusions. Small circumferential pericardial effusion. Normal left ventricular size with vigorous systolic function, EF 69%. No wall motion abnormalities. There was moderate asymmetric basal to mid septal hypertrophy (1.8 cm basal septum, 0.8 cm basal inferolateral wall). Narrowed LV outflow tract with turbulence. There is thickening of the mitral valve leaflets with either chordal systolic anterior motion or valvular systolic anterior motion, significant respiratory artifact on this study makes it difficult to distinguish. I cannot see a subaortic membrane but temporal resolution of this study is poor. Normal right ventricular size and systolic function, EF 56%. Moderate left atrial enlargement. Normal right atrium. Trileaflet aortic valve, The valve appears at most mildly restricted, there is trivial aortic regurgitation with regurgitant fraction 8%. Mitral regurgitant fraction 39% (>40% severe) with regurgitant volume 54 cc (>50 cc severe), this suggests moderate-severe mitral regurgitation. On delayed enhancement imaging, there is patchy mid-wall LGE in the basal septum, with mid-wall LGE becoming more confluent in the mid septum. Confluent mid-wall LGE in the mid-anterior, mid-anterolateral, and mid-inferolateral walls. MEASUREMENTS: MEASUREMENTS LVEDV 197 mL LVEDVi 99 mL/m2 LVSV 137 mL LVEF 69% RVEDV 179 mL RVEDVi 90 mL/m2 RVSV 100 mL RVEF 56% Aortic forward volume 83 mL Aortic regurgitant fraction 8% T1 1104, ECV 39% IMPRESSION: 1.  Small pericardial effusion. 2. Vigorous LV systolic function, EF 69%. There was moderate asymmetric basal to mid septal hypertrophy. Suspect LV outflow gradient given narrowed LVOT and turbulence in LVOT. Difficult study with poor images  due to respiratory artifact, unable to comment on subaortic membrane presence. 3.  Normal RV size and systolic function, EF 56%. 4. The mitral valve leaflets appear thickened. Images not sufficient to comment on presence or absence of vegetation. There is either chordal SAM or MV SAM (images not sufficient to distinguish). There is moderate-severe mitral regurgitation with mitral regurgitant fraction 39% and regurgitant volume  54 cc. 5. Diffuse non-coronary LGE pattern. This could be consistent with hypertrophic cardiomyopathy (or another infiltrative disease). 6. Elevated extracellular volume percentage at 39%. This suggests increased myocardial fibrotic content. Dalton Mclean Electronically Signed   By: Marca Ancona M.D.   On: 06/24/2023 17:18   MR CARDIAC VELOCITY FLOW MAP  Result Date: 06/24/2023 CLINICAL DATA:  Hypertrophic cardiomyopathy EXAM: CARDIAC MRI TECHNIQUE: The patient was scanned on a 1.5 Tesla GE magnet. A dedicated cardiac coil was used. Functional imaging was done using Fiesta sequences. 2,3, and 4 chamber views were done to assess for RWMA's. Modified Simpson's rule using a short axis stack was used to calculate an ejection fraction on a dedicated work [employer] officer, [employer]. The patient received 10 cc of Gadavist. After 10 minutes inversion recovery sequences were used to assess for infiltration and scar tissue. FINDINGS: Limited images of the lung fields showed trivial bilateral pleural effusions. Small circumferential pericardial effusion. Normal left ventricular size with vigorous systolic function, EF 69%. No wall motion abnormalities. There was moderate asymmetric basal to mid septal hypertrophy (1.8 cm basal septum, 0.8 cm basal inferolateral wall). Narrowed LV outflow tract with turbulence. There is thickening of the mitral valve leaflets with either chordal systolic anterior motion or valvular systolic anterior motion, significant respiratory artifact on this study makes it  difficult to distinguish. I cannot see a subaortic membrane but temporal resolution of this study is poor. Normal right ventricular size and systolic function, EF 56%. Moderate left atrial enlargement. Normal right atrium. Trileaflet aortic valve, The valve appears at most mildly restricted, there is trivial aortic regurgitation with regurgitant fraction 8%. Mitral regurgitant fraction 39% (>40% severe) with regurgitant volume 54 cc (>50 cc severe), this suggests moderate-severe mitral regurgitation. On delayed enhancement imaging, there is patchy mid-wall LGE in the basal septum, with mid-wall LGE becoming more confluent in the mid septum. Confluent mid-wall LGE in the mid-anterior, mid-anterolateral, and mid-inferolateral walls. MEASUREMENTS: MEASUREMENTS LVEDV 197 mL LVEDVi 99 mL/m2 LVSV 137 mL LVEF 69% RVEDV 179 mL RVEDVi 90 mL/m2 RVSV 100 mL RVEF 56% Aortic forward volume 83 mL Aortic regurgitant fraction 8% T1 1104, ECV 39% IMPRESSION: 1.  Small pericardial effusion. 2. Vigorous LV systolic function, EF 69%. There was moderate asymmetric basal to mid septal hypertrophy. Suspect LV outflow gradient given narrowed LVOT and turbulence in LVOT. Difficult study with poor images due to respiratory artifact, unable to comment on subaortic membrane presence. 3.  Normal RV size and systolic function, EF 56%. 4. The mitral valve leaflets appear thickened. Images not sufficient to comment on presence or absence of vegetation. There is either chordal SAM or MV SAM (images not sufficient to distinguish). There is moderate-severe mitral regurgitation with mitral regurgitant fraction 39% and regurgitant volume 54 cc. 5. Diffuse non-coronary LGE pattern. This could be consistent with hypertrophic cardiomyopathy (or another infiltrative disease). 6. Elevated extracellular volume percentage at 39%. This suggests increased myocardial fibrotic content. Dalton Mclean Electronically Signed   By: Marca Ancona M.D.   On: 06/24/2023  17:18   MR CARDIAC MORPHOLOGY W WO CONTRAST  Result Date: 06/24/2023 CLINICAL DATA:  Hypertrophic cardiomyopathy EXAM: CARDIAC MRI TECHNIQUE: The patient was scanned on a 1.5 Tesla GE magnet. A dedicated cardiac coil was used. Functional imaging was done using Fiesta sequences. 2,3, and 4 chamber views were done to assess for RWMA's. Modified Simpson's rule using a short axis stack was used to calculate an ejection fraction on a dedicated work [employer] officer, [employer]. The patient  received 10 cc of Gadavist. After 10 minutes inversion recovery sequences were used to assess for infiltration and scar tissue. FINDINGS: Limited images of the lung fields showed trivial bilateral pleural effusions. Small circumferential pericardial effusion. Normal left ventricular size with vigorous systolic function, EF 69%. No wall motion abnormalities. There was moderate asymmetric basal to mid septal hypertrophy (1.8 cm basal septum, 0.8 cm basal inferolateral wall). Narrowed LV outflow tract with turbulence. There is thickening of the mitral valve leaflets with either chordal systolic anterior motion or valvular systolic anterior motion, significant respiratory artifact on this study makes it difficult to distinguish. I cannot see a subaortic membrane but temporal resolution of this study is poor. Normal right ventricular size and systolic function, EF 56%. Moderate left atrial enlargement. Normal right atrium. Trileaflet aortic valve, The valve appears at most mildly restricted, there is trivial aortic regurgitation with regurgitant fraction 8%. Mitral regurgitant fraction 39% (>40% severe) with regurgitant volume 54 cc (>50 cc severe), this suggests moderate-severe mitral regurgitation. On delayed enhancement imaging, there is patchy mid-wall LGE in the basal septum, with mid-wall LGE becoming more confluent in the mid septum. Confluent mid-wall LGE in the mid-anterior, mid-anterolateral, and mid-inferolateral walls.  MEASUREMENTS: MEASUREMENTS LVEDV 197 mL LVEDVi 99 mL/m2 LVSV 137 mL LVEF 69% RVEDV 179 mL RVEDVi 90 mL/m2 RVSV 100 mL RVEF 56% Aortic forward volume 83 mL Aortic regurgitant fraction 8% T1 1104, ECV 39% IMPRESSION: 1.  Small pericardial effusion. 2. Vigorous LV systolic function, EF 69%. There was moderate asymmetric basal to mid septal hypertrophy. Suspect LV outflow gradient given narrowed LVOT and turbulence in LVOT. Difficult study with poor images due to respiratory artifact, unable to comment on subaortic membrane presence. 3.  Normal RV size and systolic function, EF 56%. 4. The mitral valve leaflets appear thickened. Images not sufficient to comment on presence or absence of vegetation. There is either chordal SAM or MV SAM (images not sufficient to distinguish). There is moderate-severe mitral regurgitation with mitral regurgitant fraction 39% and regurgitant volume 54 cc. 5. Diffuse non-coronary LGE pattern. This could be consistent with hypertrophic cardiomyopathy (or another infiltrative disease). 6. Elevated extracellular volume percentage at 39%. This suggests increased myocardial fibrotic content. Dalton Mclean Electronically Signed   By: Marca Ancona M.D.   On: 06/24/2023 17:18   CARDIAC CATHETERIZATION  Result Date: 06/24/2023 Conclusions: Nonobstructive coronary artery disease, as detailed below, including sequential 20% and 20-30% proximal/mid LAD stenoses, 50-60% ostial D1 lesion, and mild plaquing of OM1 and mid RCA. Severely elevated left heart and pulmonary artery pressures (PCWP 34 mmHg, LVEDP 40 mmHg, mean PA 46 mmHg). Mildly elevated right heart filling pressures (mean RA 7 mmHg, RVEDP 9 mmHg). Normal Fick cardiac output/index (CO 6.4 L/min, CI 3.3 L/min/m^2). 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 Suspected acute allergic reaction  to IV contrast treated with IV diphenhydramine and methylprednisolone. Recommendations: Escalate diuresis, as tolerated. 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 Yvonne Kendall, MD [facility]  [place] EKG SITE RITE  Result Date: 06/23/2023 If Site Rite image not attached, placement could not be confirmed due to current cardiac rhythm.   Scheduled Meds:  docusate sodium  100 mg Oral BID   enoxaparin (LOVENOX) injection  40 mg Subcutaneous Q24H   folic acid  1 mg Oral Daily   furosemide  40  mg Oral BID   gabapentin  300 mg Oral TID   methocarbamol  500 mg Oral TID   metoprolol tartrate  50 mg Oral BID   multivitamin with minerals  1 tablet Oral Daily   pantoprazole  40 mg Oral Daily   rosuvastatin  20 mg Oral Daily   sodium chloride flush  3 mL Intravenous Q12H   thiamine  100 mg Oral Daily   Continuous Infusions:  penicillin G potassium 12 Million Units in dextrose 5 % 500 mL CONTINUOUS infusion 12 Million Units (06/25/23 1137)    LOS: 7 days   Marguerita Merles, DO [facility] Available via Epic secure chat 7am-7pm After these hours, please refer to coverage provider listed on [email or web address] 06/25/2023, 5:42 PM

## 2023-06-25 NOTE — Progress Notes (Signed)
TRH night cross cover note:   I was notified by RN that the patient's hemoglobin level 6.6 this morning.  No new symptoms reported at this time.  Most recent blood pressure 104/60, with heart rate in the 80s.  His hemoglobin level has been consistently in the 7's since 06/20/2023, with most recent previous hemoglobin noted to be 7.8 yesterday morning.  Will order updated type and screen and transfuse 1 unit PRBC at this time, with repeat hemoglobin level to be checked following completion of this transfusion.     Newton Pigg, DO Hospitalist

## 2023-06-25 NOTE — Progress Notes (Signed)
Occupational Therapy Treatment Patient Details Name: Victor [name] MRN: [id number] DOB: 1970-08-19 Today's Date: 06/25/2023   History of present illness 52 years old male with past medical history of hyperlipidemia, obesity, hypertension, and chronic back pain presented to hospital 11/26 with exertional shortness of breath, bilateral lower extremity edema and pain for the last 2 weeks.  Patient also complains of shortness of breath at rest and has been having difficulty lying down in bed.   OT comments  Patient is essentially functioning at his baseline for ADL completion and in room mobility/toileting.  No further OT needs in the acute setting.  OT to defer to OT at next level of care to screen for any post acute needs.  Encourage ADL independence from Engineer, manufacturing.        If plan is discharge home, recommend the following:  Assist for transportation   Equipment Recommendations  None recommended by OT    Recommendations for Other Services      Precautions / Restrictions Precautions Precautions: None Restrictions Weight Bearing Restrictions: No       Mobility Bed Mobility Overal bed mobility: Independent                  Transfers Overall transfer level: Independent                       Balance Overall balance assessment: No apparent balance deficits (not formally assessed)                                         ADL either performed or assessed with clinical judgement   ADL Overall ADL's : At baseline                                            Extremity/Trunk Assessment Upper Extremity Assessment Upper Extremity Assessment: Overall WFL for tasks assessed   Lower Extremity Assessment Lower Extremity Assessment: Defer to PT evaluation   Cervical / Trunk Assessment Cervical / Trunk Assessment: Normal    Vision Patient Visual Report: No change from baseline     Perception Perception Perception: Not tested    Praxis Praxis Praxis: Not tested    Cognition Arousal: Alert Behavior During Therapy: WFL for tasks assessed/performed Overall Cognitive Status: Within Functional Limits for tasks assessed                                                             Pertinent Vitals/ Pain       Pain Assessment Pain Assessment: No/denies pain                                                          Frequency  Min 1X/week        Progress Toward Goals  OT Goals(current goals can now be found in the care plan section)  Progress towards OT goals: Goals met/education completed,  patient discharged from OT  Acute Rehab OT Goals Potential to Achieve Goals: Good  Plan      Co-evaluation                 AM-PAC OT "6 Clicks" Daily Activity     Outcome Measure   Help from another person eating meals?: None Help from another person taking care of personal grooming?: None Help from another person toileting, which includes using toliet, bedpan, or urinal?: None Help from another person bathing (including washing, rinsing, drying)?: None Help from another person to put on and taking off regular upper body clothing?: None Help from another person to put on and taking off regular lower body clothing?: None 6 Click Score: 24    End of Session    OT Visit Diagnosis: Unsteadiness on feet (R26.81)   Activity Tolerance Patient tolerated treatment well   Patient Left in bed;with call bell/phone within reach   Nurse Communication Mobility status        Time: 5621-3086 OT Time Calculation (min): 17 min  Charges: OT General Charges $OT Visit: 1 Visit OT Treatments $Self Care/Home Management : 8-22 mins  06/25/2023  RP, OTR/L  Acute Rehabilitation Services  Office:  [phone]   Suzanna Obey 06/25/2023, 2:45 PM

## 2023-06-25 NOTE — Progress Notes (Addendum)
Patient Name: Victor [name] Date of Encounter: 06/25/2023 [facility] Cardiologist: Tonny Bollman, MD   Interval Summary  .    Feeling much better, has been able to get up every morning and walk the hallways without a cane.   Vital Signs .    Vitals:   06/25/23 0004 06/25/23 0445 06/25/23 0520 06/25/23 0719  BP: 104/60 123/67  115/65  Pulse: 79 90  79  Resp: 19 15  20   Temp: 98.6 F (37 C) 98.8 F (37.1 C)  98.5 F (36.9 C)  TempSrc: Oral Oral  Oral  SpO2: 97% 96%  98%  Weight:   86.7 kg   Height:        Intake/Output Summary (Last 24 hours) at 06/25/2023 1103 Last data filed at 06/25/2023 0720 Gross per 24 hour  Intake 840 ml  Output 2380 ml  Net -1540 ml      06/25/2023    5:20 AM 06/24/2023    3:30 AM 06/23/2023    6:08 AM  Last 3 Weights  Weight (lbs) 191 lb 2.2 oz 185 lb 13.6 oz 186 lb 12.8 oz  Weight (kg) 86.7 kg 84.3 kg 84.732 kg      Telemetry/ECG    Sinus Rhythm - Personally Reviewed  Physical Exam .   GEN: No acute distress.   Neck: No JVD Cardiac: RRR, 3/6 murmur at apex, no rubs, or gallops.  Respiratory: Clear to auscultation bilaterally. GI: Soft, nontender, non-distended  MS: No edema  Assessment & Plan .     52 y.o. male with hypertension, obesity, hyperlipidemia who was admitted on 06/17/2023 with shortness of breath and acute diastolic heart failure in the setting of sepsis.  Blood cultures are positive for Streptococcus salivarius.    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 twice daily. 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 Suspected LV outflow gradient narrowed. Diffuse non-coronary LGE pattern -- Continue metoprolol 50 mg twice daily -- TCTS following    Bacteremia/endocarditis -- 2/2 blood cultures with Streptococcus salivarius bacteremia -- Had an outpatient MRI lumbar spine showing L5-S1 reactive endplate enhancement with infection difficult to exclude. Neurosurgery consulted via primary with recs for medical management -- ID following   Elevated troponin -- Suspect secondary to possible HOCM as well as endocarditis -- LHC showed diffuse non-obstructive CAD with recommendations for medical management -- continue statin, metoprolol. Hold on addition of ASA with anemia    Per Primary Anemia- hgb down to 6.6 this morning, planned for 1 unit PRBCs Thrombocytopenia Hyponatremia EtOH use  For questions or updates, please contact [facility] Please consult [email or web address] for contact info under        Signed, Laverda Page, NP    Agree with note by Laverda Page NP-C  Status post right left heart cath yesterday by Dr. Okey Dupre revealing mild nonobstructive CAD, severely elevated LVEDP and wedge pressure.  He has diuresed 8 L.  He has severe MR presumably related to SBE.  He is been seen by ID for antibiotic therapy.  On  exam today his lungs are clear.  He has no peripheral edema.  Cardiac MRI suggested possible HOCM with dynamic outflow tract gradient.  No further workup from cardiology standpoint.  Will require antibiotic therapy per ID as an outpatient after which consideration for mitral valve replacement.  Runell Gess, M.D., FACP, [facility], [facility] [address] [place], [place]  [address]  [phone] 06/25/2023 11:27 AM

## 2023-06-25 NOTE — Progress Notes (Signed)
Physical Therapy Treatment Patient Details Name: Victor [name] MRN: [id number] DOB: October 26, 1970 Today's Date: 06/25/2023   History of Present Illness 52 years old male with past medical history of hyperlipidemia, obesity, hypertension, and chronic back pain presented to hospital 11/26 with exertional shortness of breath, bilateral lower extremity edema and pain for the last 2 weeks.  Patient also complains of shortness of breath at rest and has been having difficulty lying down in bed.    PT Comments  Pt received in bed. Pt having less back pain (denied any during session) and is mobilizing safely and frequently. Continues with slow, guarded gait but is steady in hallway as well as stable with 4 stairs with use of rail. Went over balance exercises and general strengthening with pt and gave a handout for him to take home. No follow up PT needs at d/c.     If plan is discharge home, recommend the following: A little help with walking and/or transfers;A little help with bathing/dressing/bathroom;Help with stairs or ramp for entrance;Assist for transportation   Can travel by private vehicle        Equipment Recommendations  None recommended by PT    Recommendations for Other Services       Precautions / Restrictions Precautions Precautions: None Restrictions Weight Bearing Restrictions: No     Mobility  Bed Mobility Overal bed mobility: Independent                  Transfers Overall transfer level: Independent Equipment used: None Transfers: Sit to/from Stand Sit to Stand: Modified independent (Device/Increase time)                Ambulation/Gait Ambulation/Gait assistance: Contact guard assist Gait Distance (Feet): 300 Feet Assistive device: None Gait Pattern/deviations: Step-through pattern, Trunk flexed Gait velocity: slowed Gait velocity interpretation: <1.8 ft/sec, indicate of risk for recurrent falls   General Gait Details: pt with mildly guarded, very slow  gait but denies back pain today.   Stairs Stairs: Yes Stairs assistance: Contact guard assist Stair Management: One rail Left, Forwards, Alternating pattern Number of Stairs: 4 General stair comments: pt safe with use of rail, no pain with steps. Offered pt seated rest break after steps but he did not need it.   Wheelchair Mobility     Tilt Bed    Modified Rankin (Stroke Patients Only)       Balance Overall balance assessment: Mild deficits observed, not formally tested Sitting-balance support: Feet supported Sitting balance-Leahy Scale: Good     Standing balance support: Single extremity supported Standing balance-Leahy Scale: Fair                              Cognition Arousal: Alert Behavior During Therapy: WFL for tasks assessed/performed Overall Cognitive Status: Within Functional Limits for tasks assessed                                          Exercises Other Exercises Other Exercises: Balance exercises: Rhomberg with eyes closed, tandem R and L, SL stance R and L, toe taps on glove box 10x, squats x10. 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, get back to work painting PT Goal Formulation: With patient Time For Goal Achievement: 07/02/23 Potential to Achieve Goals: Good Progress towards PT goals: Progressing toward goals    Frequency    Min 1X/week      PT Plan      Co-evaluation              AM-PAC PT "6 Clicks" Mobility   Outcome Measure  Help needed turning from your back to your side while in a flat  bed without using bedrails?: None Help needed moving from lying on your back to sitting on the side of a flat bed without using bedrails?: None Help needed moving to and from a bed to a chair (including a wheelchair)?: None Help needed standing up from a chair using your arms (e.g., wheelchair or bedside chair)?: None Help needed to walk in hospital room?: None Help needed climbing 3-5 steps with a railing? : None 6 Click Score: 24    End of Session   Activity Tolerance: Patient tolerated treatment well Patient left: with call bell/phone within reach;in bed Nurse Communication: Mobility status PT Visit Diagnosis: Unsteadiness on feet (R26.81);Difficulty in walking, not elsewhere classified (R26.2);Muscle weakness (generalized) (M62.81)     Time: 6644-0347 PT Time Calculation (min) (ACUTE ONLY): 36 min  Charges:    $Gait Training: 8-22 mins $Therapeutic Exercise: 8-22 mins PT General Charges $$ ACUTE PT VISIT: 1 Visit                     Lyanne Co, PT  Acute Rehab Services Secure chat preferred Office [phone]    Lawana Chambers Taya Ashbaugh 06/25/2023, 4:52 PM

## 2023-06-26 ENCOUNTER — Inpatient Hospital Stay (HOSPITAL_COMMUNITY): Payer: Medicaid Other

## 2023-06-26 DIAGNOSIS — R0602 Shortness of breath: Secondary | ICD-10-CM | POA: Diagnosis not present

## 2023-06-26 DIAGNOSIS — I330 Acute and subacute infective endocarditis: Secondary | ICD-10-CM | POA: Diagnosis not present

## 2023-06-26 DIAGNOSIS — I340 Nonrheumatic mitral (valve) insufficiency: Secondary | ICD-10-CM | POA: Diagnosis not present

## 2023-06-26 LAB — CBC WITH DIFFERENTIAL/PLATELET
Abs Immature Granulocytes: 0.06 10*3/uL (ref 0.00–0.07)
Basophils Absolute: 0 10*3/uL (ref 0.0–0.1)
Basophils Relative: 0 %
Eosinophils Absolute: 0.1 10*3/uL (ref 0.0–0.5)
Eosinophils Relative: 2 %
HCT: 26.7 % — ABNORMAL LOW (ref 39.0–52.0)
Hemoglobin: 8.2 g/dL — ABNORMAL LOW (ref 13.0–17.0)
Immature Granulocytes: 1 %
Lymphocytes Relative: 20 %
Lymphs Abs: 1.8 10*3/uL (ref 0.7–4.0)
MCH: 28.6 pg (ref 26.0–34.0)
MCHC: 30.7 g/dL (ref 30.0–36.0)
MCV: 93 fL (ref 80.0–100.0)
Monocytes Absolute: 0.6 10*3/uL (ref 0.1–1.0)
Monocytes Relative: 7 %
Neutro Abs: 6.4 10*3/uL (ref 1.7–7.7)
Neutrophils Relative %: 70 %
Platelets: 238 10*3/uL (ref 150–400)
RBC: 2.87 MIL/uL — ABNORMAL LOW (ref 4.22–5.81)
RDW: 16.4 % — ABNORMAL HIGH (ref 11.5–15.5)
WBC: 9.1 10*3/uL (ref 4.0–10.5)
nRBC: 0 % (ref 0.0–0.2)

## 2023-06-26 LAB — TYPE AND SCREEN
ABO/RH(D): A POS
Antibody Screen: NEGATIVE
Unit division: 0

## 2023-06-26 LAB — COMPREHENSIVE METABOLIC PANEL
ALT: 113 U/L — ABNORMAL HIGH (ref 0–44)
AST: 129 U/L — ABNORMAL HIGH (ref 15–41)
Albumin: 2.4 g/dL — ABNORMAL LOW (ref 3.5–5.0)
Alkaline Phosphatase: 68 U/L (ref 38–126)
Anion gap: 7 (ref 5–15)
BUN: 13 mg/dL (ref 6–20)
CO2: 23 mmol/L (ref 22–32)
Calcium: 8.6 mg/dL — ABNORMAL LOW (ref 8.9–10.3)
Chloride: 103 mmol/L (ref 98–111)
Creatinine, Ser: 0.75 mg/dL (ref 0.61–1.24)
GFR, Estimated: >60 mL/min (ref >60)
Glucose, Bld: 122 mg/dL — ABNORMAL HIGH (ref 70–99)
Potassium: 4.2 mmol/L (ref 3.5–5.1)
Sodium: 133 mmol/L — ABNORMAL LOW (ref 135–145)
Total Bilirubin: 0.4 mg/dL (ref <1.2)
Total Protein: 7.2 g/dL (ref 6.5–8.1)

## 2023-06-26 LAB — BPAM RBC
Blood Product Expiration Date: 202412212359
ISSUE DATE / TIME: 202412031057
Unit Type and Rh: 6200

## 2023-06-26 LAB — PHOSPHORUS: Phosphorus: 4.1 mg/dL (ref 2.5–4.6)

## 2023-06-26 LAB — MAGNESIUM: Magnesium: 1.9 mg/dL (ref 1.7–2.4)

## 2023-06-26 LAB — LIPOPROTEIN A (LPA): Lipoprotein (a): 186.7 nmol/L — ABNORMAL HIGH (ref <75.0)

## 2023-06-26 NOTE — Progress Notes (Signed)
PROGRESS NOTE    Victor [name]  AOZ:[id number] DOB: 1970/10/30 DOA: 06/17/2023 PCP: Claiborne Rigg, NP   Brief Narrative:  52 years old male with past medical history of hyperlipidemia, obesity, hypertension presented to hospital with exertional shortness of breath, bilateral lower extremity edema and pain for the last 2 weeks.  Patient also complains of shortness of breath at rest and has been having difficulty lying down in bed. Further workup was done and he was found to have a a Strepotcoccal Bacteremmia and Mitral Valve Endocarditis. 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  Cardiac MRI suspicious for hypertrophic cardiomyopathy. Cardiac catheterization done and he had minimal nonobstructive CAD with elevated V waves and filling pressures as well as a subaortic gradient with increased PVCs suggesting potential hyperacute trophic cardiomyopathy.  Cardiothoracic surgery on board, plan for surgery next week Tuesday.   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 Lasix Continue metoprolol Currently he is posted fo his for surgical intervention with Dr. Leafy Ro on Tuesday 12/10 (MVR/subaortic myectomy) but TCTS requiring improvement in his Hgb prior to then   History of Hypertension Continue metoprolol   Streptococcal Salivarius Bacteremia and severe mitral valve regurgitation with mitral valve endocarditis Blood Cx x2 done and showed GPC in Chains and was showing Streptococcal Species in 4/4 Cx's with Sensitivities pending; Repeat Blood Cx 06/20/23  showing NGTD at 5 Days ID on board, continue IV penicillin  HLD Hold statins for now due to abnormal LFTs   Abnormal LFTs RUQ U/S and an Acute Hepatitis Panel in the AM Repeat CMP in the AM    Hyponatremia Uncertain etiology at this time but likely in the setting of volume overload Checked TSH 1.163.   Daily CMP   Alcohol Use Disorder Drinks 16 ounce beer probably every day.  Denies any withdrawal symptoms.   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 ID on board, MRI Lumbar Spine with Contrast and showed "Reactive endplate change with endplate enhancement about the L5-S1 interspace, favored to be degenerative in nature, although possible changes of infection are difficult to exclude, and could be considered in the correct clinical setting Neurosurgery team is evaluated the imaging and feels that no neurosurgical intervention is needed and they are just recommending continuing antibiotics  ID recommends continuing treatment with IV antibiotics as above  Normocytic Anemia with Acute Drop on 12/3 Globin dropped to 6.6, s/p 1 unit of PRBC FOBT negative Continue PPI with Pantoprazole Hold ASA for now as well as NSAIDs Anemia panel was done and showed an iron level of 18, UIBC of 237, TIBC 255, saturation ratios of 7%, ferritin of 248, folate level 9.8, vitamin B12 level 540 Continue to Monitor for S/Sx of Bleeding; no overt bleeding noted Repeat CBC in the AM GI consult in a.m.   Obesity -Complicates overall prognosis and care -Estimated body mass index is 29.94 kg/m as calculated from the following:   Height  as of this encounter: 5\' 7"  (1.702 m).   Weight as of this encounter: 86.7 kg.  -Weight Loss and Dietary Counseling given     DVT prophylaxis: enoxaparin  (LOVENOX) injection 40 mg Start: 06/25/23 0800 SCDs Start: 06/18/23 0858    Code Status: Full Code Family Communication: None at bedside  Disposition Plan:  Level of care: Telemetry Cardiac Status is: Inpatient Remains inpatient appropriate because: Needs further clinical improvement and clearance by specialist as he is undergoing surgical intervention next week   Consultants:  Cardiology Infectious Diseases Cardiothoracic Surgery Discussed with Neurosurgery  Procedures:    RIGHT AND LEFT CARDIAC CATHETERIZATION AND CORONARY ANGIOGRAPHY    Antimicrobials:  Anti-infectives (From admission, onward)    Start     Dose/Rate Route Frequency Ordered Stop   06/24/23 2200  penicillin G potassium 12 Million Units in dextrose 5 % 500 mL CONTINUOUS infusion        12 Million Units 41.7 mL/hr over 12 Hours Intravenous Every 12 hours 06/24/23 1522     06/21/23 1115  cefTRIAXone (ROCEPHIN) 2 g in sodium chloride 0.9 % 100 mL IVPB  Status:  Discontinued        2 g 200 mL/hr over 30 Minutes Intravenous Every 24 hours 06/21/23 1017 06/24/23 1522   06/19/23 1600  vancomycin (VANCOREADY) IVPB 1250 mg/250 mL  Status:  Discontinued        1,250 mg 166.7 mL/hr over 90 Minutes Intravenous Every 12 hours 06/19/23 0323 06/21/23 1017   06/19/23 1200  ceFEPIme (MAXIPIME) 2 g in sodium chloride 0.9 % 100 mL IVPB  Status:  Discontinued        2 g 200 mL/hr over 30 Minutes Intravenous Every 8 hours 06/19/23 0323 06/20/23 1156   06/19/23 0145  vancomycin (VANCOREADY) IVPB 1750 mg/350 mL        1,750 mg 175 mL/hr over 120 Minutes Intravenous  Once 06/19/23 0051 06/19/23 1044   06/19/23 0145  ceFEPIme (MAXIPIME) 2 g in sodium chloride 0.9 % 100 mL IVPB        2 g 200 mL/hr over 30 Minutes Intravenous  Once 06/19/23 0051 06/19/23 0908   06/19/23 0045  metroNIDAZOLE (FLAGYL) IVPB 500 mg  Status:  Discontinued        500 mg 100 mL/hr over 60 Minutes Intravenous Every 12 hours 06/19/23 0041 06/20/23 1156        Subjective: Seen examined at bedside and was doing okay.  Blood count had dropped for unclear reasons still he has been typed and screened and transfused 1 unit of PRBCs.  No nausea or vomiting.  Feels okay.  No other concerns or points this time.  Objective: Vitals:   06/26/23 0715 06/26/23 1201 06/26/23 1506 06/26/23 1922  BP: 117/63 119/65 121/71 138/64  Pulse: 68 69 92   Resp: (!) 22 20 20 19   Temp: 98.1 F (36.7 C) 98.1 F (36.7 C) 98 F (36.7 C) 98.3 F (36.8 C)  TempSrc: Oral Oral Oral Oral  SpO2: 95% 92% 98% 97%  Weight:      Height:        Intake/Output Summary (Last 24 hours) at 06/26/2023 2053 Last data filed at 06/26/2023 1904 Gross per 24 hour  Intake 534 ml  Output 3965 ml  Net -3431 ml   Filed Weights   06/24/23 0330 06/25/23 0520 06/26/23 0456  Weight: 84.3 kg 86.7 kg 87.4 kg   Examination: Physical Exam: General: NAD  Cardiovascular: S1, S2 present Respiratory: CTAB Abdomen:  Soft, nontender, nondistended, bowel sounds present Musculoskeletal: No bilateral pedal edema noted Skin: Normal Psychiatry: Normal mood    Data Reviewed: I have personally reviewed following labs and imaging studies  CBC: Recent Labs  Lab 06/22/23 0210 06/23/23 0230 06/24/23 0301 06/24/23 0948 06/24/23 0950 06/25/23 0232 06/25/23 1858 06/26/23 0226  WBC 8.3 9.5 8.4  --   --  9.4  --  9.1  NEUTROABS 5.7 6.5 5.7  --   --  7.2  --  6.4  HGB 7.6* 7.5* 7.3* 7.8* 7.8* 6.6* 8.7* 8.2*  HCT 23.8* 24.0* 24.0* 23.0* 23.0* 21.5* 27.9* 26.7*  MCV 94.8 93.8 94.9  --   --  94.3  --  93.0  PLT 210 226 221  --   --  221  --  238   Basic Metabolic Panel: Recent Labs  Lab 06/22/23 0210 06/23/23 0230 06/24/23 0301 06/24/23 0948 06/24/23 0950 06/25/23 0232 06/25/23 1858 06/26/23 0226  NA 131* 132* 132* 136 135 130* 136 133*  K 4.3 4.5 4.1 4.1 4.2 5.3* 4.2 4.2  CL 101 101 103  --   --  102 105 103  CO2 24 21* 21*  --   --  20* 21* 23  GLUCOSE 120* 121* 123*  --   --   235* 110* 122*  BUN 15 15 15   --   --  16 15 13   CREATININE 0.74 0.79 0.63  --   --  0.83 0.81 0.75  CALCIUM 8.7* 8.9 8.7*  --   --  8.5* 8.9 8.6*  MG 2.2 2.0 2.0  --   --  2.0  --  1.9  PHOS 4.5 3.8 4.0  --   --  3.7  --  4.1   GFR: Estimated Creatinine Clearance: 111.4 mL/min (by C-G formula based on SCr of 0.75 mg/dL). Liver Function Tests: Recent Labs  Lab 06/23/23 0230 06/24/23 0301 06/25/23 0232 06/25/23 1858 06/26/23 0226  AST 63* 84* 94* 139* 129*  ALT 44 63* 75* 111* 113*  ALKPHOS 55 72 64 72 68  BILITOT 0.5 0.3 0.4 0.6 0.4  PROT 7.4 7.2 6.9 7.8 7.2  ALBUMIN 2.5* 2.5* 2.4* 2.7* 2.4*   No results for input(s): "LIPASE", "AMYLASE" in the last 168 hours. No results for input(s): "AMMONIA" in the last 168 hours. Coagulation Profile: No results for input(s): "INR", "PROTIME" in the last 168 hours. Cardiac Enzymes: No results for input(s): "CKTOTAL", "CKMB", "CKMBINDEX", "TROPONINI" in the last 168 hours. BNP (last 3 results) No results for input(s): "PROBNP" in the last 8760 hours. HbA1C: No results for input(s): "HGBA1C" in the last 72 hours. CBG: No results for input(s): "GLUCAP" in the last 168 hours. Lipid Profile: No results for input(s): "CHOL", "HDL", "LDLCALC", "TRIG", "CHOLHDL", "LDLDIRECT" in the last 72 hours. Thyroid Function Tests: No results for input(s): "TSH", "T4TOTAL", "FREET4", "T3FREE", "THYROIDAB" in the last 72 hours. Anemia Panel: No results for input(s): "VITAMINB12", "FOLATE", "FERRITIN", "TIBC", "IRON", "RETICCTPCT" in the last 72 hours. Sepsis Labs: No results for input(s): "PROCALCITON", "LATICACIDVEN" in the last 168 hours.  Recent Results (from the past 240 hour(s))  Culture, blood (Routine X 2) w Reflex to ID Panel     Status: Abnormal   Collection Time: 06/19/23  2:39 AM   Specimen: BLOOD  Result Value Ref Range Status   Specimen Description BLOOD BLOOD LEFT ARM  Final   Special Requests   Final    BOTTLES DRAWN AEROBIC AND  ANAEROBIC Blood Culture adequate volume  Culture  Setup Time   Final    GRAM POSITIVE COCCI IN CHAINS IN BOTH AEROBIC AND ANAEROBIC BOTTLES CRITICAL RESULT CALLED TO, READ BACK BY AND VERIFIED WITH: PHARMD JAMES LEDFORD ON 06/19/23 @ 2313 BY DRT Performed at [facility], [address], [place], [place] [address]    Culture STREPTOCOCCUS SALIVARIUS (A)  Final   Report Status 06/22/2023 FINAL  Final   Organism ID, Bacteria STREPTOCOCCUS SALIVARIUS  Final      Susceptibility   Streptococcus salivarius - MIC*    PENICILLIN <=0.06 SENSITIVE Sensitive     CEFTRIAXONE <=0.12 SENSITIVE Sensitive     ERYTHROMYCIN <=0.12 SENSITIVE Sensitive     LEVOFLOXACIN 2 SENSITIVE Sensitive     VANCOMYCIN 0.5 SENSITIVE Sensitive     * STREPTOCOCCUS SALIVARIUS  Culture, blood (Routine X 2) w Reflex to ID Panel     Status: Abnormal   Collection Time: 06/19/23  2:41 AM   Specimen: BLOOD  Result Value Ref Range Status   Specimen Description BLOOD BLOOD RIGHT ARM  Final   Special Requests   Final    BOTTLES DRAWN AEROBIC AND ANAEROBIC Blood Culture adequate volume   Culture  Setup Time   Final    GRAM POSITIVE COCCI IN CHAINS IN BOTH AEROBIC AND ANAEROBIC BOTTLES CRITICAL RESULT CALLED TO, READ BACK BY AND VERIFIED WITH: PHARMD JAMES LEDFORD ON 06/19/23 @ 2313 BY DRT    Culture (A)  Final    STREPTOCOCCUS SALIVARIUS SUSCEPTIBILITIES PERFORMED ON PREVIOUS CULTURE WITHIN THE LAST 5 DAYS.    Report Status 06/22/2023 FINAL  Final  Blood Culture ID Panel (Reflexed)     Status: Abnormal   Collection Time: 06/19/23  2:41 AM  Result Value Ref Range Status   Enterococcus faecalis NOT DETECTED NOT DETECTED Final   Enterococcus Faecium NOT DETECTED NOT DETECTED Final   Listeria monocytogenes NOT DETECTED NOT DETECTED Final   Staphylococcus species NOT DETECTED NOT DETECTED Final   Staphylococcus aureus (BCID) NOT DETECTED NOT DETECTED Final   Staphylococcus epidermidis NOT DETECTED NOT DETECTED Final    Staphylococcus lugdunensis NOT DETECTED NOT DETECTED Final   Streptococcus species DETECTED (A) NOT DETECTED Final    Comment: Not Enterococcus species, Streptococcus agalactiae, Streptococcus pyogenes, or Streptococcus pneumoniae. CRITICAL RESULT CALLED TO, READ BACK BY AND VERIFIED WITH: PHARMD JAMES LEDFORD ON 06/19/23 @ 2313 BY DRT    Streptococcus agalactiae NOT DETECTED NOT DETECTED Final   Streptococcus pneumoniae NOT DETECTED NOT DETECTED Final   Streptococcus pyogenes NOT DETECTED NOT DETECTED Final   A.calcoaceticus-baumannii NOT DETECTED NOT DETECTED Final   Bacteroides fragilis NOT DETECTED NOT DETECTED Final   Enterobacterales NOT DETECTED NOT DETECTED Final   Enterobacter cloacae complex NOT DETECTED NOT DETECTED Final   Escherichia coli NOT DETECTED NOT DETECTED Final   Klebsiella aerogenes NOT DETECTED NOT DETECTED Final   Klebsiella oxytoca NOT DETECTED NOT DETECTED Final   Klebsiella pneumoniae NOT DETECTED NOT DETECTED Final   Proteus species NOT DETECTED NOT DETECTED Final   Salmonella species NOT DETECTED NOT DETECTED Final   Serratia marcescens NOT DETECTED NOT DETECTED Final   Haemophilus influenzae NOT DETECTED NOT DETECTED Final   Neisseria meningitidis NOT DETECTED NOT DETECTED Final   Pseudomonas aeruginosa NOT DETECTED NOT DETECTED Final   Stenotrophomonas maltophilia NOT DETECTED NOT DETECTED Final   Candida albicans NOT DETECTED NOT DETECTED Final   Candida auris NOT DETECTED NOT DETECTED Final   Candida glabrata NOT DETECTED NOT DETECTED Final   Candida krusei  NOT DETECTED NOT DETECTED Final   Candida parapsilosis NOT DETECTED NOT DETECTED Final   Candida tropicalis NOT DETECTED NOT DETECTED Final   Cryptococcus neoformans/gattii NOT DETECTED NOT DETECTED Final    Comment: Performed at [facility], [address] [place], [place] [address]  SARS Coronavirus 2 by RT PCR (hospital order, performed in [facility] hospital lab) *cepheid single  result test* Anterior Nasal Swab     Status: None   Collection Time: 06/19/23  2:45 AM   Specimen: Anterior Nasal Swab  Result Value Ref Range Status   SARS Coronavirus 2 by RT PCR NEGATIVE NEGATIVE Final    Comment: Performed at [facility], [address] [place], [place] [address]  Respiratory (~20 pathogens) panel by PCR     Status: None   Collection Time: 06/19/23  2:45 AM   Specimen: Nasopharyngeal Swab; Respiratory  Result Value Ref Range Status   Adenovirus NOT DETECTED NOT DETECTED Final   Coronavirus 229E NOT DETECTED NOT DETECTED Final    Comment: (NOTE) The Coronavirus on the Respiratory Panel, DOES NOT test for the novel  Coronavirus (2019 nCoV)    Coronavirus HKU1 NOT DETECTED NOT DETECTED Final   Coronavirus NL63 NOT DETECTED NOT DETECTED Final   Coronavirus OC43 NOT DETECTED NOT DETECTED Final   Metapneumovirus NOT DETECTED NOT DETECTED Final   Rhinovirus / Enterovirus NOT DETECTED NOT DETECTED Final   Influenza A NOT DETECTED NOT DETECTED Final   Influenza B NOT DETECTED NOT DETECTED Final   Parainfluenza Virus 1 NOT DETECTED NOT DETECTED Final   Parainfluenza Virus 2 NOT DETECTED NOT DETECTED Final   Parainfluenza Virus 3 NOT DETECTED NOT DETECTED Final   Parainfluenza Virus 4 NOT DETECTED NOT DETECTED Final   Respiratory Syncytial Virus NOT DETECTED NOT DETECTED Final   Bordetella pertussis NOT DETECTED NOT DETECTED Final   Bordetella Parapertussis NOT DETECTED NOT DETECTED Final   Chlamydophila pneumoniae NOT DETECTED NOT DETECTED Final   Mycoplasma pneumoniae NOT DETECTED NOT DETECTED Final    Comment: Performed at [facility], [address] [place], [place] [address]  Culture, blood (Routine X 2) w Reflex to ID Panel     Status: None   Collection Time: 06/20/23  9:40 AM   Specimen: BLOOD LEFT ARM  Result Value Ref Range Status   Specimen Description BLOOD LEFT ARM  Final   Special Requests   Final    BOTTLES DRAWN AEROBIC AND ANAEROBIC  Blood Culture adequate volume   Culture   Final    NO GROWTH 5 DAYS Performed at [facility], [address], [place], [place] [address]    Report Status 06/25/2023 FINAL  Final  Culture, blood (Routine X 2) w Reflex to ID Panel     Status: None   Collection Time: 06/20/23  9:49 AM   Specimen: BLOOD RIGHT HAND  Result Value Ref Range Status   Specimen Description BLOOD RIGHT HAND  Final   Special Requests   Final    BOTTLES DRAWN AEROBIC AND ANAEROBIC Blood Culture adequate volume   Culture   Final    NO GROWTH 5 DAYS Performed at [facility], [address] [place], [place] [address]    Report Status 06/25/2023 FINAL  Final    Radiology Studies: VAS US CAROTID  Result Date: 06/25/2023 Carotid Arterial Duplex Study Patient Name:  JERMIAH LAMPING  Date of Exam:   06/25/2023 Medical Rec #: [id number]  Accession #:    [id number] Date of Birth: [birth year]     Patient Gender: M Patient Age:   63 years Exam Location:  [facility] Procedure:      VAS US CAROTID Referring Phys: Cristal Deer END --------------------------------------------------------------------------------  Indications:       Pre-op, severe mitral regurgitation. Risk Factors:      Hypertension, hyperlipidemia, current smoker. Other Factors:     Obesity. Comparison Study:  No prior exam. Performing Technologist: Fernande Bras  Examination Guidelines: A complete evaluation includes B-mode imaging, spectral Doppler, color Doppler, and power Doppler as needed of all accessible portions of each vessel. Bilateral testing is considered an integral part of a complete examination. Limited examinations for reoccurring indications may be performed as noted.  Right Carotid Findings: +----------+--------+--------+--------+------------------+--------+           PSV cm/sEDV cm/sStenosisPlaque DescriptionComments +----------+--------+--------+--------+------------------+--------+ CCA Prox  131     22                                          +----------+--------+--------+--------+------------------+--------+ CCA Distal96      20                                         +----------+--------+--------+--------+------------------+--------+ ICA Prox  72      25              heterogenous               +----------+--------+--------+--------+------------------+--------+ ICA Mid   105     43                                         +----------+--------+--------+--------+------------------+--------+ ICA Distal100     37                                         +----------+--------+--------+--------+------------------+--------+ ECA       103     11                                         +----------+--------+--------+--------+------------------+--------+ +----------+--------+-------+--------+-------------------+           PSV cm/sEDV cmsDescribeArm Pressure (mmHG) +----------+--------+-------+--------+-------------------+ [id number]     31                                 +----------+--------+-------+--------+-------------------+ +---------+--------+--+--------+-+ VertebralPSV cm/s38EDV cm/s8 +---------+--------+--+--------+-+  Left Carotid Findings: +----------+--------+--------+--------+------------------+--------+           PSV cm/sEDV cm/sStenosisPlaque DescriptionComments +----------+--------+--------+--------+------------------+--------+ CCA Prox  111     22                                         +----------+--------+--------+--------+------------------+--------+ CCA Distal100     20                                         +----------+--------+--------+--------+------------------+--------+  ICA Prox  85      22              smooth                     +----------+--------+--------+--------+------------------+--------+ ICA Mid   96      35                                         +----------+--------+--------+--------+------------------+--------+ ICA  Distal84      26                                         +----------+--------+--------+--------+------------------+--------+ ECA       99      13                                         +----------+--------+--------+--------+------------------+--------+ +----------+--------+--------+--------+-------------------+           PSV cm/sEDV cm/sDescribeArm Pressure (mmHG) +----------+--------+--------+--------+-------------------+ Subclavian165     27                                  +----------+--------+--------+--------+-------------------+ +---------+--------+--+--------+--+ VertebralPSV cm/s57EDV cm/s18 +---------+--------+--+--------+--+   Summary: Right Carotid: Velocities in the right ICA are consistent with a 1-39% stenosis.                The ECA appears <50% stenosed. Left Carotid: Velocities in the left ICA are consistent with a 1-39% stenosis.               The ECA appears <50% stenosed. Vertebrals:  Bilateral vertebral arteries demonstrate antegrade flow. Subclavians: Normal flow hemodynamics were seen in bilateral subclavian              arteries. *See table(s) above for measurements and observations.  Electronically signed by Lemar Livings MD on 06/25/2023 at 5:17:02 PM.    Final     Scheduled Meds:  docusate sodium  100 mg Oral BID   enoxaparin (LOVENOX) injection  40 mg Subcutaneous Q24H   folic acid  1 mg Oral Daily   furosemide  40 mg Oral BID   gabapentin  300 mg Oral TID   methocarbamol  500 mg Oral TID   metoprolol tartrate  50 mg Oral BID   multivitamin with minerals  1 tablet Oral Daily   pantoprazole  40 mg Oral Daily   sodium chloride flush  3 mL Intravenous Q12H   thiamine  100 mg Oral Daily   Continuous Infusions:  penicillin G potassium 12 Million Units in dextrose 5 % 500 mL CONTINUOUS infusion 12 Million Units (06/26/23 2048)    LOS: 8 days   Briant Cedar, MD [facility] Available via Epic secure chat 7am-7pm After these hours,  please refer to coverage provider listed on [email or web address] 06/26/2023, 8:53 PM

## 2023-06-26 NOTE — Progress Notes (Signed)
Physical Therapy Treatment Patient Details Name: Victor [name] MRN: [id number] DOB: Jan 17, 1971 Today's Date: 06/26/2023   History of Present Illness 52 years old male with past medical history of hyperlipidemia, obesity, hypertension, and chronic back pain presented to hospital 11/26 with exertional shortness of breath, bilateral lower extremity edema and pain for the last 2 weeks.  Patient also complains of shortness of breath at rest and has been having difficulty lying down in bed.    PT Comments  Pt is reporting less low back pain and an ability to lie flat in bed with more ease now. He also reports improved endurance. He did get a little SOB with serial sit <> stand reps with his arms crossed across his chest though, but he recovered with a seated rest break. Pt cued to continue to walk the halls and perform sit <> stand reps to progress his endurance further. Pt needed cues to speed up when ambulating and to relax his arms to swing naturally when ambulating as he continues to ambulate slowly and cautiously. Will continue to follow acutely.   If plan is discharge home, recommend the following: A little help with bathing/dressing/bathroom;Help with stairs or ramp for entrance;Assist for transportation   Can travel by private vehicle        Equipment Recommendations  None recommended by PT    Recommendations for Other Services       Precautions / Restrictions Precautions Precautions: None Restrictions Weight Bearing Restrictions: No     Mobility  Bed Mobility Overal bed mobility: Modified Independent Bed Mobility: Supine to Sit     Supine to sit: HOB elevated, Modified independent (Device/Increase time)     General bed mobility comments: No assistance needed to exit L EOB    Transfers Overall transfer level: Independent Equipment used: None Transfers: Sit to/from Stand Sit to Stand: Independent           General transfer comment: Pt able to transfer to stand 11x from  EOB, 10x without use of UEs with arms crossed across chest    Ambulation/Gait Ambulation/Gait assistance: Supervision, Modified independent (Device/Increase time) Gait Distance (Feet): 260 Feet Assistive device: None Gait Pattern/deviations: Step-through pattern, Decreased stride length Gait velocity: slowed Gait velocity interpretation: 1.31 - 2.62 ft/sec, indicative of limited community ambulator   General Gait Details: Pt ambulates slowly and cautiously with a step-through gait pattern without LOB, even when cued to turn head L <> R and nod head up <> down. Pt able to increase speed and relax arms for a more natural swing when cued. Supervision for safety initially but capable of being mod I   Stairs             Wheelchair Mobility     Tilt Bed    Modified Rankin (Stroke Patients Only)       Balance Overall balance assessment: Mild deficits observed, not formally tested (very mild) Sitting-balance support: Feet supported Sitting balance-Leahy Scale: Good     Standing balance support: No upper extremity supported, During functional activity Standing balance-Leahy Scale: Good                              Cognition Arousal: Alert Behavior During Therapy: WFL for tasks assessed/performed Overall Cognitive Status: Within Functional Limits for tasks assessed  Exercises Other Exercises Other Exercises: sit <> stand 10x with arms across chest    General Comments General comments (skin integrity, edema, etc.): VSS      Pertinent Vitals/Pain Pain Assessment Pain Assessment: Faces Faces Pain Scale: Hurts a little bit Pain Location: low back, intermittent Pain Descriptors / Indicators: Discomfort Pain Intervention(s): Limited activity within patient's tolerance, Monitored during session    Home Living                          Prior Function            PT Goals (current goals  can now be found in the care plan section) Acute Rehab PT Goals 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, get back to work painting PT Goal Formulation: With patient Time For Goal Achievement: 07/02/23 Potential to Achieve Goals: Good Progress towards PT goals: Progressing toward goals    Frequency    Min 1X/week      PT Plan      Co-evaluation              AM-PAC PT "6 Clicks" Mobility   Outcome Measure  Help needed turning from your back to your side while in a flat bed without using bedrails?: None Help needed moving from lying on your back to sitting on the side of a flat bed without using bedrails?: None Help needed moving to and from a bed to a chair (including a wheelchair)?: None Help needed standing up from a chair using your arms (e.g., wheelchair or bedside chair)?: None Help needed to walk in hospital room?: None Help needed climbing 3-5 steps with a railing? : None 6 Click Score: 24    End of Session   Activity Tolerance: Patient tolerated treatment well Patient left: with call bell/phone within reach;in bed   PT Visit Diagnosis: Unsteadiness on feet (R26.81);Difficulty in walking, not elsewhere classified (R26.2);Muscle weakness (generalized) (M62.81)     Time: 4403-4742 PT Time Calculation (min) (ACUTE ONLY): 14 min  Charges:    $Therapeutic Exercise: 8-22 mins PT General Charges $$ ACUTE PT VISIT: 1 Visit                     Virgil Benedict, PT, DPT Acute Rehabilitation Services  Office: [phone]    Bettina Gavia 06/26/2023, 12:07 PM

## 2023-06-26 NOTE — TOC Progression Note (Signed)
Transition of Care [facility]) - Progression Note    Patient Details  Name: Victor [name] MRN: [id number] Date of Birth: July 03, 1971  Transition of Care [facility]) CM/SW Contact  Leone Haven, RN Phone Number: 06/26/2023, 2:51 PM  Clinical Narrative:    Per Jeri Modena they are OON for patient and she made referral to [facility] which patient is ok with also for home iv abx.  He states he will have surgery on Tuesday,  so he will be here for that.  He is set up with [facility], with Catie [phone]. She will have RN to come to do teaching with patient prior to dc, and the [facility] will follow up with him at home for picc care. The patient mentioned to NCM today that he may be going to his son house at dc.  He did not have the address today but will have it tomorrow.         Expected Discharge Plan and Services                                               Social Determinants of Health (SDOH) Interventions SDOH Screenings   Food Insecurity: Food Insecurity Present (06/18/2023)  Housing: Medium Risk (06/18/2023)  Transportation Needs: Unmet Transportation Needs (06/18/2023)  Utilities: Not At Risk (06/18/2023)  Depression (PHQ2-9): Medium Risk (05/07/2023)  Tobacco Use: High Risk (06/21/2023)    Readmission Risk Interventions     No data to display

## 2023-06-26 NOTE — Progress Notes (Signed)
Discussed with pt IS (currently 1750 ml), sternal precautions, mobility post op, and d/c planning. Receptive with appropriate questions. He moved to recliner, practicing sternal precautions. He prefers to ambulate hall later tonight. Gave educational materials to review.  [phone] Ethelda Chick BS, ACSM-CEP 06/26/2023 2:50 PM

## 2023-06-26 NOTE — Progress Notes (Signed)
     [address]       [name] [address]             [phone]       I have been asked to assume surgical responsibility for this pt Have scheduled for surgery next Tuesday Will need GI consultation for anemia Will follow

## 2023-06-26 NOTE — Progress Notes (Addendum)
Patient Name: Victor [name] Date of Encounter: 06/26/2023 [facility] Cardiologist: Tonny Bollman, MD   Interval Summary  .    No complaints this morning. Breathing continues to improve.   Vital Signs .    Vitals:   06/26/23 0029 06/26/23 0406 06/26/23 0456 06/26/23 0715  BP: 117/63 121/70  117/63  Pulse: 80 86  68  Resp: 18 17  (!) 22  Temp: 99 F (37.2 C) 98.8 F (37.1 C)  98.1 F (36.7 C)  TempSrc: Oral Oral  Oral  SpO2: 98% 95%  95%  Weight:   87.4 kg   Height:        Intake/Output Summary (Last 24 hours) at 06/26/2023 1103 Last data filed at 06/26/2023 0715 Gross per 24 hour  Intake 994 ml  Output 4420 ml  Net -3426 ml      06/26/2023    4:56 AM 06/25/2023    5:20 AM 06/24/2023    3:30 AM  Last 3 Weights  Weight (lbs) 192 lb 10.9 oz 191 lb 2.2 oz 185 lb 13.6 oz  Weight (kg) 87.4 kg 86.7 kg 84.3 kg      Telemetry/ECG    Sinus Rhythm, PVCs - Personally Reviewed  Physical Exam .   GEN: No acute distress.   Neck: No JVD Cardiac: RRR, 3/6 systolic murmur at apex, rubs, or gallops.  Respiratory: Clear to auscultation bilaterally. GI: Soft, nontender, non-distended  MS: No edema  Assessment & Plan .     52 y.o. male with hypertension, obesity, hyperlipidemia who was admitted on 06/17/2023 with shortness of breath and acute diastolic heart failure in the setting of sepsis.  Blood cultures are positive for Streptococcus salivarius.    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 twice daily. 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 Suspected LV outflow gradient narrowed. Diffuse non-coronary LGE pattern -- Continue metoprolol 50 mg twice daily -- TCTS following, with tentative plans for surgery with MVR and subaortic myectomy on 12/10   Bacteremia/endocarditis -- 2/2 blood cultures with Streptococcus salivarius bacteremia -- Had an outpatient MRI lumbar spine showing L5-S1 reactive endplate enhancement with infection difficult to exclude. Neurosurgery consulted via primary with recs for medical management -- ID following   Elevated troponin -- Suspect secondary to possible HOCM as well as endocarditis -- LHC showed diffuse non-obstructive CAD with recommendations for medical management -- on statin, metoprolol. Hold on addition of ASA with anemia    Anemia -- presented with Hgb in the 7 range, FOBT negative  -- hgb down to 6.6 12/3, s/p 1 unit PRBCs with improvement to 8.2 -- consider GI work up with the need for cardiac surgery next week  Elevated LFTs -- AST 94>>139>>129, ALT 75>>111>>113 -- will hold statin for now -- management per primary   For questions or updates, please contact [facility] Please consult [email or web address] for contact info under        Signed, Laverda Page, NP    Agree with note by Laverda Page NP-C  Patient was admitted  with heart failure and mitral valve SBE.  He underwent right left heart cath demonstrating nonobstructive CAD with elevated LVEDP and wedge pressure.  He is being seen by ID and is on IV antibiotics.  Plan is for mitral valve repair next Tuesday with Dr. Harrell Gave.  Other problems include elevated LFTs for unclear reasons and anemia for unclear reasons.  This will need to be followed up prior to surgery.  He did receive 1 unit of packed red blood cells which increased his hemoglobin from 6.6 up to 8.2.  We will continue to follow at a  distance.  Runell Gess, M.D., FACP, [facility], [facility] [address] [place], [place]  [address]  [phone] 06/26/2023 12:42 PM

## 2023-06-26 NOTE — Plan of Care (Signed)
  Problem: Education: Goal: Knowledge of General Education information will improve Description: Including pain rating scale, medication(s)/side effects and non-pharmacologic comfort measures Outcome: Progressing   Problem: Health Behavior/Discharge Planning: Goal: Ability to manage health-related needs will improve Outcome: Progressing   Problem: Clinical Measurements: Goal: Ability to maintain clinical measurements within normal limits will improve Outcome: Progressing Goal: Will remain free from infection Outcome: Progressing Goal: Diagnostic test results will improve Outcome: Progressing Goal: Respiratory complications will improve Outcome: Progressing Goal: Cardiovascular complication will be avoided Outcome: Progressing   Problem: Activity: Goal: Risk for activity intolerance will decrease Outcome: Progressing   Problem: Nutrition: Goal: Adequate nutrition will be maintained Outcome: Progressing   Problem: Coping: Goal: Level of anxiety will decrease Outcome: Progressing   Problem: Elimination: Goal: Will not experience complications related to bowel motility Outcome: Progressing Goal: Will not experience complications related to urinary retention Outcome: Progressing   Problem: Pain Management: Goal: General experience of comfort will improve Outcome: Progressing   Problem: Safety: Goal: Ability to remain free from injury will improve Outcome: Progressing   Problem: Skin Integrity: Goal: Risk for impaired skin integrity will decrease Outcome: Progressing   Problem: Education: Goal: Understanding of CV disease, CV risk reduction, and recovery process will improve Outcome: Progressing Goal: Individualized Educational Video(s) Outcome: Progressing   Problem: Activity: Goal: Ability to return to baseline activity level will improve Outcome: Progressing   Problem: Cardiovascular: Goal: Ability to achieve and maintain adequate cardiovascular perfusion  will improve Outcome: Progressing Goal: Vascular access site(s) Level 0-1 will be maintained Outcome: Progressing   Problem: Health Behavior/Discharge Planning: Goal: Ability to safely manage health-related needs after discharge will improve Outcome: Progressing   Problem: Education: Goal: Knowledge of General Education information will improve Description: Including pain rating scale, medication(s)/side effects and non-pharmacologic comfort measures Outcome: Progressing   Problem: Health Behavior/Discharge Planning: Goal: Ability to manage health-related needs will improve Outcome: Progressing   Problem: Clinical Measurements: Goal: Ability to maintain clinical measurements within normal limits will improve Outcome: Progressing Goal: Will remain free from infection Outcome: Progressing Goal: Diagnostic test results will improve Outcome: Progressing Goal: Respiratory complications will improve Outcome: Progressing Goal: Cardiovascular complication will be avoided Outcome: Progressing   Problem: Activity: Goal: Risk for activity intolerance will decrease Outcome: Progressing   Problem: Nutrition: Goal: Adequate nutrition will be maintained Outcome: Progressing   Problem: Coping: Goal: Level of anxiety will decrease Outcome: Progressing   Problem: Elimination: Goal: Will not experience complications related to bowel motility Outcome: Progressing Goal: Will not experience complications related to urinary retention Outcome: Progressing   Problem: Pain Management: Goal: General experience of comfort will improve Outcome: Progressing   Problem: Safety: Goal: Ability to remain free from injury will improve Outcome: Progressing   Problem: Skin Integrity: Goal: Risk for impaired skin integrity will decrease Outcome: Progressing   Problem: Education: Goal: Understanding of CV disease, CV risk reduction, and recovery process will improve Outcome: Progressing Goal:  Individualized Educational Video(s) Outcome: Progressing   Problem: Activity: Goal: Ability to return to baseline activity level will improve Outcome: Progressing   Problem: Cardiovascular: Goal: Ability to achieve and maintain adequate cardiovascular perfusion will improve Outcome: Progressing Goal: Vascular access site(s) Level 0-1 will be maintained Outcome: Progressing   Problem: Health Behavior/Discharge Planning: Goal: Ability to safely manage health-related needs after discharge will improve Outcome: Progressing

## 2023-06-27 ENCOUNTER — Inpatient Hospital Stay (HOSPITAL_COMMUNITY): Payer: Medicaid Other

## 2023-06-27 DIAGNOSIS — R748 Abnormal levels of other serum enzymes: Secondary | ICD-10-CM | POA: Diagnosis not present

## 2023-06-27 DIAGNOSIS — D509 Iron deficiency anemia, unspecified: Secondary | ICD-10-CM

## 2023-06-27 DIAGNOSIS — K5730 Diverticulosis of large intestine without perforation or abscess without bleeding: Secondary | ICD-10-CM | POA: Diagnosis not present

## 2023-06-27 DIAGNOSIS — R932 Abnormal findings on diagnostic imaging of liver and biliary tract: Secondary | ICD-10-CM | POA: Diagnosis not present

## 2023-06-27 DIAGNOSIS — I330 Acute and subacute infective endocarditis: Secondary | ICD-10-CM

## 2023-06-27 DIAGNOSIS — I059 Rheumatic mitral valve disease, unspecified: Secondary | ICD-10-CM | POA: Insufficient documentation

## 2023-06-27 DIAGNOSIS — R0602 Shortness of breath: Secondary | ICD-10-CM | POA: Diagnosis not present

## 2023-06-27 DIAGNOSIS — I340 Nonrheumatic mitral (valve) insufficiency: Secondary | ICD-10-CM

## 2023-06-27 DIAGNOSIS — R7989 Other specified abnormal findings of blood chemistry: Secondary | ICD-10-CM | POA: Diagnosis not present

## 2023-06-27 DIAGNOSIS — K7689 Other specified diseases of liver: Secondary | ICD-10-CM | POA: Diagnosis not present

## 2023-06-27 DIAGNOSIS — D123 Benign neoplasm of transverse colon: Secondary | ICD-10-CM | POA: Diagnosis not present

## 2023-06-27 DIAGNOSIS — D120 Benign neoplasm of cecum: Secondary | ICD-10-CM | POA: Diagnosis not present

## 2023-06-27 LAB — CBC WITH DIFFERENTIAL/PLATELET
Abs Immature Granulocytes: 0.09 10*3/uL — ABNORMAL HIGH (ref 0.00–0.07)
Basophils Absolute: 0.1 10*3/uL (ref 0.0–0.1)
Basophils Relative: 1 %
Eosinophils Absolute: 0.2 10*3/uL (ref 0.0–0.5)
Eosinophils Relative: 2 %
HCT: 27 % — ABNORMAL LOW (ref 39.0–52.0)
Hemoglobin: 8.6 g/dL — ABNORMAL LOW (ref 13.0–17.0)
Immature Granulocytes: 1 %
Lymphocytes Relative: 23 %
Lymphs Abs: 2.3 10*3/uL (ref 0.7–4.0)
MCH: 29.3 pg (ref 26.0–34.0)
MCHC: 31.9 g/dL (ref 30.0–36.0)
MCV: 91.8 fL (ref 80.0–100.0)
Monocytes Absolute: 0.7 10*3/uL (ref 0.1–1.0)
Monocytes Relative: 7 %
Neutro Abs: 6.7 10*3/uL (ref 1.7–7.7)
Neutrophils Relative %: 66 %
Platelets: 249 10*3/uL (ref 150–400)
RBC: 2.94 MIL/uL — ABNORMAL LOW (ref 4.22–5.81)
RDW: 16.2 % — ABNORMAL HIGH (ref 11.5–15.5)
WBC: 10 10*3/uL (ref 4.0–10.5)
nRBC: 0 % (ref 0.0–0.2)

## 2023-06-27 LAB — HEPATITIS PANEL, ACUTE
HCV Ab: NONREACTIVE
Hep A IgM: NONREACTIVE
Hep B C IgM: NONREACTIVE
Hepatitis B Surface Ag: NONREACTIVE

## 2023-06-27 LAB — COMPREHENSIVE METABOLIC PANEL
ALT: 109 U/L — ABNORMAL HIGH (ref 0–44)
AST: 98 U/L — ABNORMAL HIGH (ref 15–41)
Albumin: 2.5 g/dL — ABNORMAL LOW (ref 3.5–5.0)
Alkaline Phosphatase: 67 U/L (ref 38–126)
Anion gap: 11 (ref 5–15)
BUN: 16 mg/dL (ref 6–20)
CO2: 24 mmol/L (ref 22–32)
Calcium: 8.9 mg/dL (ref 8.9–10.3)
Chloride: 96 mmol/L — ABNORMAL LOW (ref 98–111)
Creatinine, Ser: 0.86 mg/dL (ref 0.61–1.24)
GFR, Estimated: >60 mL/min (ref >60)
Glucose, Bld: 114 mg/dL — ABNORMAL HIGH (ref 70–99)
Potassium: 4.2 mmol/L (ref 3.5–5.1)
Sodium: 131 mmol/L — ABNORMAL LOW (ref 135–145)
Total Bilirubin: 0.6 mg/dL (ref <1.2)
Total Protein: 7.1 g/dL (ref 6.5–8.1)

## 2023-06-27 LAB — HEPATITIS B CORE ANTIBODY, TOTAL: Hep B Core Total Ab: NONREACTIVE

## 2023-06-27 LAB — HEPATITIS A ANTIBODY, TOTAL: hep A Total Ab: NONREACTIVE

## 2023-06-27 LAB — PROTIME-INR
INR: 1.2 (ref 0.8–1.2)
Prothrombin Time: 15 s (ref 11.4–15.2)

## 2023-06-27 MED ORDER — PEG-KCL-NACL-NASULF-NA ASC-C 100 G PO SOLR: 1.0000 | Freq: Once | ORAL | Status: DC

## 2023-06-27 MED ORDER — POLYETHYLENE GLYCOL 3350 17 GM/SCOOP PO POWD
0.5000 | Freq: Once | ORAL | Status: AC
  Administered 2023-06-27: 127.5 g via ORAL
  Filled 2023-06-27: qty 255

## 2023-06-27 MED ORDER — PEG-KCL-NACL-NASULF-NA ASC-C 100 G PO SOLR
0.5000 | Freq: Once | ORAL | Status: AC
  Administered 2023-06-28: 100 g via ORAL

## 2023-06-27 MED ORDER — PEG-KCL-NACL-NASULF-NA ASC-C 100 G PO SOLR: 0.5000 | Freq: Once | ORAL | Status: DC

## 2023-06-27 MED ORDER — PEG-KCL-NACL-NASULF-NA ASC-C 100 G PO SOLR
0.5000 | Freq: Once | ORAL | Status: DC
  Filled 2023-06-27: qty 1

## 2023-06-27 MED ORDER — PEG-KCL-NACL-NASULF-NA ASC-C 100 G PO SOLR
0.5000 | Freq: Once | ORAL | Status: AC
  Administered 2023-06-27: 100 g via ORAL
  Filled 2023-06-27: qty 1

## 2023-06-27 NOTE — Anesthesia Preprocedure Evaluation (Addendum)
Anesthesia Evaluation  Patient identified by MRN, date of birth, ID band Patient awake    Reviewed: Allergy & Precautions, NPO status , Patient's Chart, lab work & pertinent test results  History of Anesthesia Complications Negative for: history of anesthetic complications  Airway Mallampati: II  TM Distance: >3 FB Neck ROM: Full   Comment: Previous grade I view with Miller 2 Dental no notable dental hx. (+) Dental Advisory Given   Pulmonary neg pulmonary ROS   Pulmonary exam normal breath sounds clear to auscultation       Cardiovascular hypertension, Pt. on medications (-) angina + CAD (non-obstructive)  + dysrhythmias (RBBB) + Valvular Problems/Murmurs (severe) MR  Rhythm:Regular Rate:Normal + Systolic murmurs HLD  MR Cardiac Morphology 06/24/2023: IMPRESSION: 1.  Small pericardial effusion.   2. Vigorous LV systolic function, EF 69%. There was moderate asymmetric basal to mid septal hypertrophy. Suspect LV outflow gradient given narrowed LVOT and turbulence in LVOT. Difficult study with poor images due to respiratory artifact, unable to comment on subaortic membrane presence.   3.  Normal RV size and systolic function, EF 56%.   4. The mitral valve leaflets appear thickened. Images not sufficient to comment on presence or absence of vegetation. There is either chordal SAM or MV SAM (images not sufficient to distinguish). There is moderate-severe mitral regurgitation with mitral regurgitant fraction 39% and regurgitant volume 54 cc.   5. Diffuse non-coronary LGE pattern. This could be consistent with hypertrophic cardiomyopathy (or another infiltrative disease).   6. Elevated extracellular volume percentage at 39%. This suggests increased myocardial fibrotic content  R/LHC 06/24/2023: Conclusions: 1. Nonobstructive coronary artery disease, as detailed below, including sequential 20% and 20-30% proximal/mid LAD  stenoses, 50-60% ostial D1 lesion, and mild plaquing of OM1 and mid RCA. 2. Severely elevated left heart and pulmonary artery pressures (PCWP 34 mmHg, LVEDP 40 mmHg, mean PA 46 mmHg). 3. Mildly elevated right heart filling pressures (mean RA 7 mmHg, RVEDP 9 mmHg). 4. Normal Fick cardiac output/index (CO 6.4 L/min, CI 3.3 L/min/m^2). 5. 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 6. Suspected acute allergic reaction to IV contrast treated with IV diphenhydramine and methylprednisolone.  TEE 06/21/2023: IMPRESSIONS     1. Aortic valve is trileaflet without evidence of stenosis. 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 MG 19.4 mmHG. Vmax 3.1 m/s.  Peak gradient 38.1 mmHG. There is SAM of the subchordal apparatus which  has calcifications. Suspect this  explains the dynamic obstruction which is related to [facility] of the chordal  calcification and a subaortic membrane. The aortic valve is tricuspid.  Aortic valve regurgitation is not visualized. No aortic stenosis is  present.   2. Degnerative mitral valve with moderately calcified anterior and  posterior mitral valve leaflets. There is a vegetation on the PMVL that  measures 0.8 cm x 0.6 cm. There is severe mitral regurgitation due to  incomplete leaflet coaptation, but cannot  exclude a posterior mitral valve cleft. There is also chordal  calcifications with SAM and dynamic outflow obstruction associated with a  subaortic membrane as described. MVA 2.89 cm2. MG 4 mmHG @ 86 bpm. The  mitral valve is degenerative. Severe mitral  valve regurgitation. Mild mitral stenosis. The mean mitral valve gradient  is 4.0 mmHg with average heart rate of 86 bpm.   3. Left ventricular ejection fraction, by estimation, is 65 to 70%. The  left ventricle has normal function. There is moderate  asymmetric left  ventricular hypertrophy of the basal-septal segment.   4. Right ventricular systolic function is normal. The right ventricular  size is normal.   5. No left atrial/left atrial appendage thrombus was detected. The LAA  emptying velocity was 83 cm/s.   6. Agitated saline contrast bubble study was negative, with no evidence  of any interatrial shunt.     Neuro/Psych neg Seizures  Neuromuscular disease (lumbar back pain with radiculopathy)    GI/Hepatic negative GI ROS,,,(+) Cirrhosis         Endo/Other  Pre-diabetes  Renal/GU negative Renal ROS     Musculoskeletal   Abdominal   Peds  Hematology  (+) Blood dyscrasia, anemia Lab Results      Component                Value               Date                      WBC                      10.0                06/27/2023                HGB                      8.6 (L)             06/27/2023                HCT                      27.0 (L)            06/27/2023                MCV                      91.8                06/27/2023                PLT                      249                 06/27/2023              Anesthesia Other Findings 52 years old male with past medical history of hyperlipidemia, obesity, hypertension presented to hospital with exertional shortness of breath, bilateral lower extremity edema and pain for the last 2 weeks.  Patient also complains of shortness of breath at rest and has been having difficulty lying down in bed. Further workup was done and he was found to have a a Strepotcoccal Bacteremmia and Mitral Valve Endocarditis. 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  Cardiac MRI suspicious for hypertrophic cardiomyopathy. Cardiac catheterization done  and he had minimal nonobstructive CAD with elevated V waves and filling pressures as well as a subaortic gradient with increased PVCs  suggesting potential hyperacute trophic cardiomyopathy.  Cardiothoracic surgery on board, plan for surgery next week Tuesday.  Reproductive/Obstetrics                             Anesthesia Physical Anesthesia Plan  ASA: 4  Anesthesia Plan: MAC   Post-op Pain Management:    Induction: Intravenous  PONV Risk Score and Plan: Propofol infusion, TIVA and Treatment may vary due to age or medical condition  Airway Management Planned: Natural Airway and Nasal Cannula  Additional Equipment:   Intra-op Plan:   Post-operative Plan:   Informed Consent: I have reviewed the patients History and Physical, chart, labs and discussed the procedure including the risks, benefits and alternatives for the proposed anesthesia with the patient or authorized representative who has indicated his/her understanding and acceptance.     Dental advisory given  Plan Discussed with: CRNA and Anesthesiologist  Anesthesia Plan Comments: (Discussed with patient risks of MAC including, but not limited to, minor pain or discomfort, hearing people in the room, and possible need for backup general anesthesia. Risks for general anesthesia also discussed including, but not limited to, sore throat, hoarse voice, chipped/damaged teeth, injury to vocal cords, nausea and vomiting, allergic reactions, lung infection, heart attack, stroke, and death. All questions answered. )        Anesthesia Quick Evaluation

## 2023-06-27 NOTE — Progress Notes (Signed)
Pt inquired about possible SNF placement after surgery d/t concern of social situation when d/c home. He reported that he is currently residing with friends that smoke indoors and is concerned about getting an infection at home.   Pt walked this morning with standby assist, reports practicing sit and stands, and denies questions about upcoming surgery.  [phone]  Victor Congress MS, ACSM-CEP 06/27/2023 11:01 AM

## 2023-06-27 NOTE — H&P (View-Only) (Signed)
 Referring Provider: Dr. Andreas Newport Primary Care Physician:  Claiborne Rigg, NP Primary Gastroenterologist:  Gentry Fitz   Reason for Consultation: Iron deficiency anemia  HPI: Victor [name] is a 52 y.o. male with a past medical history of hypertension, hypertriglyceridemia, obesity and lower back pain.  Patient was admitted to the hospital 06/17/2023 with exertional shortness of breath and bilateral lower extremity edema which progressively worsened x 2 weeks. He also noted having chest pain x 1 week. 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 Admission labs showed anemia with a hemoglobin level of 8.3 down from 11.1 on 06/17/2023.  Platelets 142.  Sodium 127.  BUN 7.  Creatinine 0.63.  Elevated BNP 592.9.  Troponin 60.  TSH 1.613.  Iron 18.  Saturation ratio 7%.  TIBC 255.  Ferritin 248.  Elevated AST 50 on 11/28, normal ALT, total bili and alk phos levels. EKG showed sinus tachycardia with right bundle branch block. Chest x-ray was negative.  He received broad-spectrum antibiotics, ID was consulted and remained on IV Vancomycin and subsequently was transition to pen G infusion.  He underwent extensive cardiac evaluation. 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  Cardiac MRI suspicious for hypertrophic cardiomyopathy. Cardiac catheterization done and he had minimal nonobstructive CAD with elevated V waves and filling pressures as well as a subaortic gradient with increased PVCs suggesting potential hyperacute trophic cardiomyopathy.  He is scheduled for mitral valve placement surgery/04/2023.  A GI consult was requested for further evaluation regarding iron deficiency anemia, endoscopic evaluation requested by cardiovascular  surgery prior to pursuing MVR.  Hemoglobin level dropped from 7.8 -> 6.6 on 12/3, transfused 1 unit of PRBCs -> 8.7 -> 8.2 -> today Hg 8.6.  No overt GI bleeding.  He denies having any nausea or vomiting.  He has infrequent heartburn.  No dysphagia.  No upper or lower abdominal pain.  He typically passes a normal brown formed stool daily.  He endorsed taking oral steroid for back pain x 5 consecutive days about one month ago and one day he passed a black stool without recurrence.  No bright red rectal bleeding.  He denies ever having an EGD or screening colonoscopy.  No known family history of esophageal, gastric or colorectal cancer.  No NSAID use, he takes Tylenol as needed.  He chews tobacco for the past 25 years.  He drinks two 24 ounce beer 2 days weekly and stated he was not a heavy drinker. Prior hospitalist's note documented patient drinks 16 ounces of beer daily.  His LFTs have drifted upward since admission.  Acute hepatitis panel negative.  RUQ sonogram today showed a heterogeneous coarsened hepatic echotexture with nodule contour concerning for cirrhosis without ascites or liver lesion.  No cholelithiasis or biliary ductal dilatation.  He denies any known history of liver disease.  CARDIAC WORK UP:  Cardiac cath 06/24/2023: Conclusions: Nonobstructive coronary artery disease, as detailed below, including sequential 20% and 20-30% proximal/mid LAD stenoses, 50-60% ostial D1 lesion, and mild plaquing of OM1 and mid RCA. Severely elevated left heart and pulmonary artery pressures (PCWP 34 mmHg, LVEDP 40 mmHg, mean PA 46 mmHg). Mildly elevated right heart filling pressures (mean RA 7 mmHg, RVEDP 9 mmHg). Normal Fick cardiac output/index (CO 6.4 L/min, CI 3.3 L/min/m^2). 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 Suspected acute allergic reaction to  IV contrast treated with IV diphenhydramine and methylprednisolone.    TEE ECHO 06/21/2023 IMPRESSIONS Aortic valve is trileaflet without evidence of stenosis. 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 MG 19.4 mmHG. Vmax 3.1 m/s. Peak gradient 38.1 mmHG. There is SAM of the subchordal apparatus which has calcifications. Suspect this explains the dynamic obstruction which is related to [facility] of the chordal calcification and a subaortic membrane. The aortic valve is tricuspid. Aortic valve regurgitation is not visualized. No aortic stenosis is present. 1. Degnerative mitral valve with moderately calcified anterior and posterior mitral valve leaflets. There is a vegetation on the PMVL that measures 0.8 cm x 0.6 cm. There is severe mitral regurgitation due to incomplete leaflet coaptation, but cannot exclude a posterior mitral valve cleft. There is also chordal calcifications with SAM and dynamic outflow obstruction associated with a subaortic membrane as described. MVA 2.89 cm2. MG 4 mmHG @ 86 bpm. The mitral valve is degenerative. Severe mitral valve regurgitation. Mild mitral stenosis. The mean mitral valve gradient is 4.0 mmHg with average heart rate of 86 bpm. Left ventricular ejection fraction, by estimation, is 65 to 70%. The left ventricle has normal function. There is moderate asymmetric left ventricular hypertrophy of the basal-septal segment. 3. 4. Right ventricular systolic function is normal. The right ventricular size is normal. No left atrial/left atrial appendage thrombus was detected. The LAA emptying velocity was 83 cm/s. 5. Agitated saline contrast bubble study was negative, with no evidence of any interatrial shunt.  ECHO 06/18/2023: IMPRESSIONS Left ventricular ejection fraction, by estimation, is 70 to 75%. The left ventricle has hyperdynamic function. The LV outflow tract is narrowed. There is a mid-cavity gradient peak 48 mmHg. There appears to be an LV outflow peak gradient as  high as 117 mmHg with Valsalva. Possible mild systolic anterior motion of the mitral valve. The left ventricle has no regional wall motion abnormalities. There is mild concentric left ventricular hypertrophy. Left ventricular diastolic parameters are consistent with Grade II diastolic dysfunction (pseudonormalization). 1. Right ventricular systolic function is normal. The right ventricular size is normal. Tricuspid regurgitation signal is inadequate for assessing PA pressure. 2. 3. Left atrial size was moderately dilated. The mitral valve is abnormal. The valve is moderately calcified and restricted with calcification of the chords. Possible mild SAM but cannot be definitive. Trivial mitral valve regurgitation. Probably moderate mitral stenosis. The mean mitral valve gradient is 11.0 mmHg. 4. The aortic valve was not well visualized. Aortic valve regurgitation is not visualized. Moderate aortic valve stenosis. Aortic valve mean gradient measures 22.0 mmHg. 5. The inferior vena cava is normal in size with <50% respiratory variability, suggesting right atrial pressure of 8 mmHg. 6. 7. Cannot rule out PFO vs small ASD (possible color flow across septum). This patient needs a TEE. There are a number of abnormalities that are difficult to work out by TTE. He has a hyperdynamic left ventricle with a mid-cavity gradient. The LVOT is also significantly narrowed, possibly with SAM, with very high LVOT gradient with valsalva. 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   Past Medical History:  Diagnosis Date   Dizziness 10/15/2013    "SWIMMY HEAD    "   Hypertension    Hypertriglyceridemia    Obesity     Past Surgical History:  Procedure Laterality Date   INGUINAL HERNIA REPAIR Right 02/03/2021  Procedure: HERNIA REPAIR INGUINAL ADULT;  Surgeon: Diamantina Monks, MD;  Location: [facility] OR;  Service: General;  Laterality: Right;   INSERTION OF MESH Right 02/03/2021   Procedure: INSERTION OF MESH;  Surgeon:  Diamantina Monks, MD;  Location: [facility] OR;  Service: General;  Laterality: Right;   NO PAST SURGERIES     RIGHT/LEFT HEART CATH AND CORONARY ANGIOGRAPHY N/A 06/24/2023   Procedure: RIGHT/LEFT HEART CATH AND CORONARY ANGIOGRAPHY;  Surgeon: Yvonne Kendall, MD;  Location: [facility] INVASIVE CV LAB;  Service: Cardiovascular;  Laterality: N/A;   TRANSESOPHAGEAL ECHOCARDIOGRAM (CATH LAB) N/A 06/21/2023   Procedure: TRANSESOPHAGEAL ECHOCARDIOGRAM;  Surgeon: Sande Rives, MD;  Location: [facility] INVASIVE CV LAB;  Service: Cardiovascular;  Laterality: N/A;    Prior to Admission medications   Medication Sig Start Date End Date Taking? Authorizing Provider  acetaminophen (TYLENOL) 500 MG tablet Take 1,000 mg by mouth in the morning, at noon, and at bedtime.   Yes [provider]  aspirin 81 MG EC tablet Take 1 tablet (81 mg total) by mouth daily. 07/27/20  Yes Claiborne Rigg, NP  meloxicam (MOBIC) 15 MG tablet Take 1 tablet (15 mg total) by mouth daily. Patient not taking: Reported on 06/18/2023 05/23/23   Juanda Chance, NP  methocarbamol (ROBAXIN) 500 MG tablet Take 1 tablet (500 mg total) by mouth 3 (three) times daily. Patient not taking: Reported on 06/18/2023 05/23/23   Juanda Chance, NP  amLODipine (NORVASC) 10 MG tablet Take 1 tablet (10 mg total) by mouth daily. Patient not taking: Reported on 06/18/2023 03/22/23   Gwyneth Sprout, MD  diclofenac (VOLTAREN) 75 MG EC tablet Take 1 tablet (75 mg total) by mouth 2 (two) times daily. Patient not taking: Reported on 06/18/2023 05/07/23   Storm Frisk, MD  furosemide (LASIX) 20 MG tablet Take 1 tablet (20 mg total) by mouth daily. 06/08/23   Claiborne Rigg, NP  gabapentin (NEURONTIN) 300 MG capsule Take 1 capsule (300 mg total) by mouth 3 (three) times daily. Patient not taking: Reported on 06/18/2023 05/07/23   Storm Frisk, MD  gemfibrozil (LOPID) 600 MG tablet Take 1 tablet (600 mg total) by mouth 2 (two) times daily before a  meal. Patient not taking: Reported on 06/18/2023 03/22/23   Gwyneth Sprout, MD  losartan (COZAAR) 50 MG tablet Take 1 tablet (50 mg total) by mouth daily. Patient not taking: Reported on 06/18/2023 10/15/22   Hoy Register, MD  predniSONE (DELTASONE) 50 MG tablet Take 1 tablet (50 mg total) by mouth daily with breakfast. Take until completed. Patient not taking: Reported on 06/18/2023 05/15/23   Juanda Chance, NP  rosuvastatin (CRESTOR) 20 MG tablet Take 1 tablet (20 mg total) by mouth daily. Patient not taking: Reported on 06/18/2023 03/22/23   Gwyneth Sprout, MD  tiZANidine (ZANAFLEX) 4 MG tablet Take 1 tablet (4 mg total) by mouth every 6 (six) hours as needed for muscle spasms. Patient not taking: Reported on 06/18/2023 05/07/23   Storm Frisk, MD    Current Facility-Administered Medications  Medication Dose Route Frequency Provider Last Rate Last Admin   acetaminophen (TYLENOL) tablet 650 mg  650 mg Oral Q6H PRN End, Cristal Deer, MD   650 mg at 06/26/23 2356   albuterol (PROVENTIL) (2.5 MG/3ML) 0.083% nebulizer solution 2.5 mg  2.5 mg Nebulization Q4H PRN End, Cristal Deer, MD       docusate sodium (COLACE) capsule 100 mg  100 mg Oral BID End, Cristal Deer, MD   100  mg at 06/27/23 0803   enoxaparin (LOVENOX) injection 40 mg  40 mg Subcutaneous Q24H End, Christopher, MD   40 mg at 06/27/23 0801   folic acid (FOLVITE) tablet 1 mg  1 mg Oral Daily End, Christopher, MD   1 mg at 06/27/23 6440   furosemide (LASIX) tablet 40 mg  40 mg Oral BID End, Cristal Deer, MD   40 mg at 06/27/23 0802   gabapentin (NEURONTIN) capsule 300 mg  300 mg Oral TID End, Cristal Deer, MD   300 mg at 06/27/23 0802   guaiFENesin (ROBITUSSIN) 100 MG/5ML liquid 5 mL  5 mL Oral Q4H PRN End, Cristal Deer, MD   5 mL at 06/18/23 2119   melatonin tablet 3 mg  3 mg Oral QHS PRN End, Cristal Deer, MD   3 mg at 06/27/23 3474   menthol-cetylpyridinium (CEPACOL) lozenge 3 mg  1 lozenge Oral PRN End, Cristal Deer, MD        methocarbamol (ROBAXIN) tablet 500 mg  500 mg Oral TID End, Cristal Deer, MD   500 mg at 06/27/23 0803   metoprolol tartrate (LOPRESSOR) tablet 50 mg  50 mg Oral BID End, Cristal Deer, MD   50 mg at 06/27/23 0803   multivitamin with minerals tablet 1 tablet  1 tablet Oral Daily End, Cristal Deer, MD   1 tablet at 06/27/23 0804   ondansetron (ZOFRAN) tablet 4 mg  4 mg Oral Q6H PRN End, Cristal Deer, MD       Or   ondansetron (ZOFRAN) injection 4 mg  4 mg Intravenous Q6H PRN End, Cristal Deer, MD   4 mg at 06/21/23 1039   oxyCODONE (Oxy IR/ROXICODONE) immediate release tablet 5 mg  5 mg Oral Q4H PRN End, Cristal Deer, MD   5 mg at 06/27/23 0228   pantoprazole (PROTONIX) EC tablet 40 mg  40 mg Oral Daily End, Christopher, MD   40 mg at 06/27/23 2595   penicillin G potassium 12 Million Units in dextrose 5 % 500 mL CONTINUOUS infusion  12 Million Units Intravenous Q12H Gardiner Barefoot, MD 41.7 mL/hr at 06/27/23 0904 12 Million Units at 06/27/23 6387   sodium chloride flush (NS) 0.9 % injection 3 mL  3 mL Intravenous Q12H End, Christopher, MD   3 mL at 06/27/23 5643   sodium chloride flush (NS) 0.9 % injection 3 mL  3 mL Intravenous PRN End, Cristal Deer, MD       thiamine (VITAMIN B1) tablet 100 mg  100 mg Oral Daily End, Christopher, MD   100 mg at 06/27/23 0802    Allergies as of 06/17/2023   (No Known Allergies)    Family History  Problem Relation Age of Onset   CAD Mother        stenting @ 68; alive @ 47   Aneurysm Mother        thoracic s/p surgery @ 29   Lung disease Mother    Other Father        unknown    Social History   Socioeconomic History   Marital status: Single    Spouse name: Not on file   Number of children: Not on file   Years of education: Not on file   Highest education level: Not on file  Occupational History   Not on file  Tobacco Use   Smoking status: Never   Smokeless tobacco: Current    Types: Chew  Vaping Use   Vaping status: Never Used  Substance and  Sexual Activity   Alcohol use: Yes  Comment: 4 12 oz beers twice/wk.   Drug use: No   Sexual activity: Yes  Other Topics Concern   Not on file  Social History Narrative   Lives in [place].  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   Social Determinants of Health   Financial Resource Strain: Not on file  Food Insecurity: Food Insecurity Present (06/18/2023)   Hunger Vital Sign    Worried About Running Out of Food in the Last Year: Sometimes true    Ran Out of Food in the Last Year: Sometimes true  Transportation Needs: Unmet Transportation Needs (06/18/2023)   PRAPARE - Administrator, Civil Service (Medical): Yes    Lack of Transportation (Non-Medical): No  Physical Activity: Not on file  Stress: Not on file  Social Connections: Not on file  Intimate Partner Violence: Not At Risk (06/18/2023)   Humiliation, Afraid, Rape, and Kick questionnaire    Fear of Current or Ex-Partner: No    Emotionally Abused: No    Physically Abused: No    Sexually Abused: No    Review of Systems: Gen: Denies fever, sweats or chills. No weight loss.  CV: See HPI. Resp: Denies cough, shortness of breath of hemoptysis.  GI: See HPI. GU : Denies urinary burning, blood in urine, increased urinary frequency or incontinence. MS: + Lower back pain.  Derm: Denies rash, itchiness, skin lesions or unhealing ulcers. Psych: Denies depression, anxiety, memory loss or confusion. Heme: Denies easy bruising, bleeding. Neuro:  Denies headaches, dizziness or paresthesias. Endo:  Denies any problems with DM, thyroid or adrenal function.  Physical Exam: Vital signs in last 24 hours: Temp:  [98 F (36.7 C)-99.2 F (37.3 C)] 98.6 F (37 C) (12/05 1141) Pulse Rate:  [70-92] 70 (12/05 1141) Resp:  [16-20] 20 (12/05 1141) BP: (107-138)/(57-71) 108/57 (12/05 1141) SpO2:  [94 %-98 %] 94 % (12/05 1141) Weight:  [87.3 kg] 87.3 kg (12/05 0332) Last BM Date : 06/26/23 General:  Alert  52 year old male in no acute distress. Head:  Normocephalic and atraumatic. Eyes:  No scleral icterus. Conjunctiva pink. Ears:  Normal auditory acuity. Nose:  No deformity, discharge or lesions. Mouth: Absent dentition.  No ulcers or lesions.  Neck:  Supple. No lymphadenopathy or thyromegaly.  Lungs: Breath sounds clear throughout. No wheezes, rhonchi or crackles.  Heart: Regular rate and rhythm, 2/6 systolic murmur. Abdomen: Protuberant, nontender.  No hepatosplenomegaly.  No palpable mass.  Small umbilical hernia.  Positive bowel sounds to all 4 quadrants.  No bruit. Rectal: Deferred. Musculoskeletal:  Symmetrical without gross deformities.  Pulses:  Normal pulses noted. Extremities: Bilateral lower extremities with mild edema. Neurologic:  Alert and  oriented x 4. No focal deficits.  Ambulating in room. Skin:  Intact without significant lesions or rashes. Psych:  Alert and cooperative. Normal mood and affect.  Intake/Output from previous day: 12/04 0701 - 12/05 0700 In: 3130.7 [P.O.:894; I.V.:3; IV Piggyback:2233.7] Out: 4340 [Urine:4340] Intake/Output this shift: Total I/O In: 658.8 [P.O.:480; I.V.:3; IV Piggyback:175.8] Out: 1340 [Urine:1340]  Lab Results: Recent Labs    06/25/23 0232 06/25/23 1858 06/26/23 0226 06/27/23 0218  WBC 9.4  --  9.1 10.0  HGB 6.6* 8.7* 8.2* 8.6*  HCT 21.5* 27.9* 26.7* 27.0*  PLT 221  --  238 249   BMET Recent Labs    06/25/23 1858 06/26/23 0226 06/27/23 0218  NA 136 133* 131*  K 4.2 4.2 4.2  CL 105 103 96*  CO2 21*  23 24  GLUCOSE 110* 122* 114*  BUN 15 13 16   CREATININE 0.81 0.75 0.86  CALCIUM 8.9 8.6* 8.9   LFT Recent Labs    06/27/23 0218  PROT 7.1  ALBUMIN 2.5*  AST 98*  ALT 109*  ALKPHOS 67  BILITOT 0.6   PT/INR No results for input(s): "LABPROT", "INR" in the last 72 hours. Hepatitis Panel Recent Labs    06/27/23 0218  HEPBSAG NON REACTIVE  HCVAB NON REACTIVE  HEPAIGM NON REACTIVE  HEPBIGM NON REACTIVE     Studies/Results: US Abdomen Limited RUQ (LIVER/GB)  Result Date: 06/27/2023 CLINICAL DATA:  Elevated LFTs. EXAM: ULTRASOUND ABDOMEN LIMITED RIGHT UPPER QUADRANT COMPARISON:  None Available. FINDINGS: Gallbladder: No gallstones or wall thickening visualized. No sonographic Murphy sign noted by sonographer. Common bile duct: Diameter: 3-4 mm Liver: Heterogeneous coarsened hepatic echotexture with nodular contour. Portal vein is patent on color Doppler imaging with normal direction of blood flow towards the liver. Other: None. IMPRESSION: 1. Heterogeneous coarsened hepatic echotexture with nodular contour. Imaging features suggest cirrhosis. 2. No evidence for cholelithiasis or biliary ductal dilatation. Electronically Signed   By: Kennith Center M.D.   On: 06/27/2023 06:15    IMPRESSION/PLAN:  52 year old man admitted to the hospital with SOB and lower extremity edema secondary to strep bacteremia sepsis, mitral valve endocarditis with severe regurgitation and acute diastolic heart failure. On Pen G infusion. Successful diuresis, on Furosemide. Mitral valve replacement surgery scheduled 07/02/2023.  Iron deficiency anemia.  Admission Hg 8.3 -> 6.6 on 12/3 -> transfused 1 unit of PRBCs -> Hg Hg 8.7 -> 8.2 -> 8.7.  No overt GI bleeding.  Patient reported passing black stool x 1 after taking oral steroids x 5 days for lower back pain 1 month ago.  Cardiovascular surgery requesting endoscopic evaluation prior to undergoing mitral valve replacement surgery -Pantoprazole 40 mg p.o. daily -Transfuse for hemoglobin level < 8 -EGD and colonoscopy tomorrow, benefits and risks discussed including risk with sedation, risk of bleeding, perforation and infection, timing to be determined. -Clear liquid diet  -NPO after midnight  -Bowel prep ordered  -IV fluids per the hospitalist  Mildly elevated LFTs.  LFTs drifting downward today.  AST 129 -> 98. ALT 113 -> 109. Normal T. Bili and Alk phos levels.  Patient  drinks two 24 ounce beers weekly.  Negative acute hepatitis panel.  RUQ sonogram today showed heterogeneous coarsened hepatic echotexture with nodular contour suggestive of cirrhosis. CTAP 01/2021 showed hepatic steatosis with probable early cirrhosis. 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 -PT/INR, ANA, AMA, SMA, IgG, hepatitis B core total antibody, hepatitis B surface antibody, hepatitis A total antibody, ceruloplasmin and alpha 1 antitrypsin -Patient counseled complete alcohol abstinence -Continue folate, thiamine and multivitamin daily -Eventual abdominal CT versus liver MRI  Hyponatremia secondary to suspected cirrhosis and Furosemide -BMP in am    Arnaldo Natal  06/27/2023, 3:30PM

## 2023-06-27 NOTE — Plan of Care (Signed)

## 2023-06-27 NOTE — Consult Note (Addendum)
Referring Provider: Dr. Andreas Newport Primary Care Physician:  Claiborne Rigg, NP Primary Gastroenterologist:  Gentry Fitz   Reason for Consultation: Iron deficiency anemia  HPI: Victor [name] is a 52 y.o. male with a past medical history of hypertension, hypertriglyceridemia, obesity and lower back pain.  Patient was admitted to the hospital 06/17/2023 with exertional shortness of breath and bilateral lower extremity edema which progressively worsened x 2 weeks. He also noted having chest pain x 1 week. 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 Admission labs showed anemia with a hemoglobin level of 8.3 down from 11.1 on 06/17/2023.  Platelets 142.  Sodium 127.  BUN 7.  Creatinine 0.63.  Elevated BNP 592.9.  Troponin 60.  TSH 1.613.  Iron 18.  Saturation ratio 7%.  TIBC 255.  Ferritin 248.  Elevated AST 50 on 11/28, normal ALT, total bili and alk phos levels. EKG showed sinus tachycardia with right bundle branch block. Chest x-ray was negative.  He received broad-spectrum antibiotics, ID was consulted and remained on IV Vancomycin and subsequently was transition to pen G infusion.  He underwent extensive cardiac evaluation. 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  Cardiac MRI suspicious for hypertrophic cardiomyopathy. Cardiac catheterization done and he had minimal nonobstructive CAD with elevated V waves and filling pressures as well as a subaortic gradient with increased PVCs suggesting potential hyperacute trophic cardiomyopathy.  He is scheduled for mitral valve placement surgery/04/2023.  A GI consult was requested for further evaluation regarding iron deficiency anemia, endoscopic evaluation requested by cardiovascular  surgery prior to pursuing MVR.  Hemoglobin level dropped from 7.8 -> 6.6 on 12/3, transfused 1 unit of PRBCs -> 8.7 -> 8.2 -> today Hg 8.6.  No overt GI bleeding.  He denies having any nausea or vomiting.  He has infrequent heartburn.  No dysphagia.  No upper or lower abdominal pain.  He typically passes a normal brown formed stool daily.  He endorsed taking oral steroid for back pain x 5 consecutive days about one month ago and one day he passed a black stool without recurrence.  No bright red rectal bleeding.  He denies ever having an EGD or screening colonoscopy.  No known family history of esophageal, gastric or colorectal cancer.  No NSAID use, he takes Tylenol as needed.  He chews tobacco for the past 25 years.  He drinks two 24 ounce beer 2 days weekly and stated he was not a heavy drinker. Prior hospitalist's note documented patient drinks 16 ounces of beer daily.  His LFTs have drifted upward since admission.  Acute hepatitis panel negative.  RUQ sonogram today showed a heterogeneous coarsened hepatic echotexture with nodule contour concerning for cirrhosis without ascites or liver lesion.  No cholelithiasis or biliary ductal dilatation.  He denies any known history of liver disease.  CARDIAC WORK UP:  Cardiac cath 06/24/2023: Conclusions: Nonobstructive coronary artery disease, as detailed below, including sequential 20% and 20-30% proximal/mid LAD stenoses, 50-60% ostial D1 lesion, and mild plaquing of OM1 and mid RCA. Severely elevated left heart and pulmonary artery pressures (PCWP 34 mmHg, LVEDP 40 mmHg, mean PA 46 mmHg). Mildly elevated right heart filling pressures (mean RA 7 mmHg, RVEDP 9 mmHg). Normal Fick cardiac output/index (CO 6.4 L/min, CI 3.3 L/min/m^2). 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 Suspected acute allergic reaction to  IV contrast treated with IV diphenhydramine and methylprednisolone.    TEE ECHO 06/21/2023 IMPRESSIONS Aortic valve is trileaflet without evidence of stenosis. 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 MG 19.4 mmHG. Vmax 3.1 m/s. Peak gradient 38.1 mmHG. There is SAM of the subchordal apparatus which has calcifications. Suspect this explains the dynamic obstruction which is related to [facility] of the chordal calcification and a subaortic membrane. The aortic valve is tricuspid. Aortic valve regurgitation is not visualized. No aortic stenosis is present. 1. Degnerative mitral valve with moderately calcified anterior and posterior mitral valve leaflets. There is a vegetation on the PMVL that measures 0.8 cm x 0.6 cm. There is severe mitral regurgitation due to incomplete leaflet coaptation, but cannot exclude a posterior mitral valve cleft. There is also chordal calcifications with SAM and dynamic outflow obstruction associated with a subaortic membrane as described. MVA 2.89 cm2. MG 4 mmHG @ 86 bpm. The mitral valve is degenerative. Severe mitral valve regurgitation. Mild mitral stenosis. The mean mitral valve gradient is 4.0 mmHg with average heart rate of 86 bpm. Left ventricular ejection fraction, by estimation, is 65 to 70%. The left ventricle has normal function. There is moderate asymmetric left ventricular hypertrophy of the basal-septal segment. 3. 4. Right ventricular systolic function is normal. The right ventricular size is normal. No left atrial/left atrial appendage thrombus was detected. The LAA emptying velocity was 83 cm/s. 5. Agitated saline contrast bubble study was negative, with no evidence of any interatrial shunt.  ECHO 06/18/2023: IMPRESSIONS Left ventricular ejection fraction, by estimation, is 70 to 75%. The left ventricle has hyperdynamic function. The LV outflow tract is narrowed. There is a mid-cavity gradient peak 48 mmHg. There appears to be an LV outflow peak gradient as  high as 117 mmHg with Valsalva. Possible mild systolic anterior motion of the mitral valve. The left ventricle has no regional wall motion abnormalities. There is mild concentric left ventricular hypertrophy. Left ventricular diastolic parameters are consistent with Grade II diastolic dysfunction (pseudonormalization). 1. Right ventricular systolic function is normal. The right ventricular size is normal. Tricuspid regurgitation signal is inadequate for assessing PA pressure. 2. 3. Left atrial size was moderately dilated. The mitral valve is abnormal. The valve is moderately calcified and restricted with calcification of the chords. Possible mild SAM but cannot be definitive. Trivial mitral valve regurgitation. Probably moderate mitral stenosis. The mean mitral valve gradient is 11.0 mmHg. 4. The aortic valve was not well visualized. Aortic valve regurgitation is not visualized. Moderate aortic valve stenosis. Aortic valve mean gradient measures 22.0 mmHg. 5. The inferior vena cava is normal in size with <50% respiratory variability, suggesting right atrial pressure of 8 mmHg. 6. 7. Cannot rule out PFO vs small ASD (possible color flow across septum). This patient needs a TEE. There are a number of abnormalities that are difficult to work out by TTE. He has a hyperdynamic left ventricle with a mid-cavity gradient. The LVOT is also significantly narrowed, possibly with SAM, with very high LVOT gradient with valsalva. 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   Past Medical History:  Diagnosis Date   Dizziness 10/15/2013    "SWIMMY HEAD    "   Hypertension    Hypertriglyceridemia    Obesity     Past Surgical History:  Procedure Laterality Date   INGUINAL HERNIA REPAIR Right 02/03/2021  Procedure: HERNIA REPAIR INGUINAL ADULT;  Surgeon: Diamantina Monks, MD;  Location: [facility] OR;  Service: General;  Laterality: Right;   INSERTION OF MESH Right 02/03/2021   Procedure: INSERTION OF MESH;  Surgeon:  Diamantina Monks, MD;  Location: [facility] OR;  Service: General;  Laterality: Right;   NO PAST SURGERIES     RIGHT/LEFT HEART CATH AND CORONARY ANGIOGRAPHY N/A 06/24/2023   Procedure: RIGHT/LEFT HEART CATH AND CORONARY ANGIOGRAPHY;  Surgeon: Yvonne Kendall, MD;  Location: [facility] INVASIVE CV LAB;  Service: Cardiovascular;  Laterality: N/A;   TRANSESOPHAGEAL ECHOCARDIOGRAM (CATH LAB) N/A 06/21/2023   Procedure: TRANSESOPHAGEAL ECHOCARDIOGRAM;  Surgeon: Sande Rives, MD;  Location: [facility] INVASIVE CV LAB;  Service: Cardiovascular;  Laterality: N/A;    Prior to Admission medications   Medication Sig Start Date End Date Taking? Authorizing Provider  acetaminophen (TYLENOL) 500 MG tablet Take 1,000 mg by mouth in the morning, at noon, and at bedtime.   Yes [provider]  aspirin 81 MG EC tablet Take 1 tablet (81 mg total) by mouth daily. 07/27/20  Yes Claiborne Rigg, NP  meloxicam (MOBIC) 15 MG tablet Take 1 tablet (15 mg total) by mouth daily. Patient not taking: Reported on 06/18/2023 05/23/23   Juanda Chance, NP  methocarbamol (ROBAXIN) 500 MG tablet Take 1 tablet (500 mg total) by mouth 3 (three) times daily. Patient not taking: Reported on 06/18/2023 05/23/23   Juanda Chance, NP  amLODipine (NORVASC) 10 MG tablet Take 1 tablet (10 mg total) by mouth daily. Patient not taking: Reported on 06/18/2023 03/22/23   Gwyneth Sprout, MD  diclofenac (VOLTAREN) 75 MG EC tablet Take 1 tablet (75 mg total) by mouth 2 (two) times daily. Patient not taking: Reported on 06/18/2023 05/07/23   Storm Frisk, MD  furosemide (LASIX) 20 MG tablet Take 1 tablet (20 mg total) by mouth daily. 06/08/23   Claiborne Rigg, NP  gabapentin (NEURONTIN) 300 MG capsule Take 1 capsule (300 mg total) by mouth 3 (three) times daily. Patient not taking: Reported on 06/18/2023 05/07/23   Storm Frisk, MD  gemfibrozil (LOPID) 600 MG tablet Take 1 tablet (600 mg total) by mouth 2 (two) times daily before a  meal. Patient not taking: Reported on 06/18/2023 03/22/23   Gwyneth Sprout, MD  losartan (COZAAR) 50 MG tablet Take 1 tablet (50 mg total) by mouth daily. Patient not taking: Reported on 06/18/2023 10/15/22   Hoy Register, MD  predniSONE (DELTASONE) 50 MG tablet Take 1 tablet (50 mg total) by mouth daily with breakfast. Take until completed. Patient not taking: Reported on 06/18/2023 05/15/23   Juanda Chance, NP  rosuvastatin (CRESTOR) 20 MG tablet Take 1 tablet (20 mg total) by mouth daily. Patient not taking: Reported on 06/18/2023 03/22/23   Gwyneth Sprout, MD  tiZANidine (ZANAFLEX) 4 MG tablet Take 1 tablet (4 mg total) by mouth every 6 (six) hours as needed for muscle spasms. Patient not taking: Reported on 06/18/2023 05/07/23   Storm Frisk, MD    Current Facility-Administered Medications  Medication Dose Route Frequency Provider Last Rate Last Admin   acetaminophen (TYLENOL) tablet 650 mg  650 mg Oral Q6H PRN End, Cristal Deer, MD   650 mg at 06/26/23 2356   albuterol (PROVENTIL) (2.5 MG/3ML) 0.083% nebulizer solution 2.5 mg  2.5 mg Nebulization Q4H PRN End, Cristal Deer, MD       docusate sodium (COLACE) capsule 100 mg  100 mg Oral BID End, Cristal Deer, MD   100  mg at 06/27/23 0803   enoxaparin (LOVENOX) injection 40 mg  40 mg Subcutaneous Q24H End, Christopher, MD   40 mg at 06/27/23 0801   folic acid (FOLVITE) tablet 1 mg  1 mg Oral Daily End, Christopher, MD   1 mg at 06/27/23 6440   furosemide (LASIX) tablet 40 mg  40 mg Oral BID End, Cristal Deer, MD   40 mg at 06/27/23 0802   gabapentin (NEURONTIN) capsule 300 mg  300 mg Oral TID End, Cristal Deer, MD   300 mg at 06/27/23 0802   guaiFENesin (ROBITUSSIN) 100 MG/5ML liquid 5 mL  5 mL Oral Q4H PRN End, Cristal Deer, MD   5 mL at 06/18/23 2119   melatonin tablet 3 mg  3 mg Oral QHS PRN End, Cristal Deer, MD   3 mg at 06/27/23 3474   menthol-cetylpyridinium (CEPACOL) lozenge 3 mg  1 lozenge Oral PRN End, Cristal Deer, MD        methocarbamol (ROBAXIN) tablet 500 mg  500 mg Oral TID End, Cristal Deer, MD   500 mg at 06/27/23 0803   metoprolol tartrate (LOPRESSOR) tablet 50 mg  50 mg Oral BID End, Cristal Deer, MD   50 mg at 06/27/23 0803   multivitamin with minerals tablet 1 tablet  1 tablet Oral Daily End, Cristal Deer, MD   1 tablet at 06/27/23 0804   ondansetron (ZOFRAN) tablet 4 mg  4 mg Oral Q6H PRN End, Cristal Deer, MD       Or   ondansetron (ZOFRAN) injection 4 mg  4 mg Intravenous Q6H PRN End, Cristal Deer, MD   4 mg at 06/21/23 1039   oxyCODONE (Oxy IR/ROXICODONE) immediate release tablet 5 mg  5 mg Oral Q4H PRN End, Cristal Deer, MD   5 mg at 06/27/23 0228   pantoprazole (PROTONIX) EC tablet 40 mg  40 mg Oral Daily End, Christopher, MD   40 mg at 06/27/23 2595   penicillin G potassium 12 Million Units in dextrose 5 % 500 mL CONTINUOUS infusion  12 Million Units Intravenous Q12H Gardiner Barefoot, MD 41.7 mL/hr at 06/27/23 0904 12 Million Units at 06/27/23 6387   sodium chloride flush (NS) 0.9 % injection 3 mL  3 mL Intravenous Q12H End, Christopher, MD   3 mL at 06/27/23 5643   sodium chloride flush (NS) 0.9 % injection 3 mL  3 mL Intravenous PRN End, Cristal Deer, MD       thiamine (VITAMIN B1) tablet 100 mg  100 mg Oral Daily End, Christopher, MD   100 mg at 06/27/23 0802    Allergies as of 06/17/2023   (No Known Allergies)    Family History  Problem Relation Age of Onset   CAD Mother        stenting @ 68; alive @ 47   Aneurysm Mother        thoracic s/p surgery @ 29   Lung disease Mother    Other Father        unknown    Social History   Socioeconomic History   Marital status: Single    Spouse name: Not on file   Number of children: Not on file   Years of education: Not on file   Highest education level: Not on file  Occupational History   Not on file  Tobacco Use   Smoking status: Never   Smokeless tobacco: Current    Types: Chew  Vaping Use   Vaping status: Never Used  Substance and  Sexual Activity   Alcohol use: Yes  Comment: 4 12 oz beers twice/wk.   Drug use: No   Sexual activity: Yes  Other Topics Concern   Not on file  Social History Narrative   Lives in [place].  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   Social Determinants of Health   Financial Resource Strain: Not on file  Food Insecurity: Food Insecurity Present (06/18/2023)   Hunger Vital Sign    Worried About Running Out of Food in the Last Year: Sometimes true    Ran Out of Food in the Last Year: Sometimes true  Transportation Needs: Unmet Transportation Needs (06/18/2023)   PRAPARE - Administrator, Civil Service (Medical): Yes    Lack of Transportation (Non-Medical): No  Physical Activity: Not on file  Stress: Not on file  Social Connections: Not on file  Intimate Partner Violence: Not At Risk (06/18/2023)   Humiliation, Afraid, Rape, and Kick questionnaire    Fear of Current or Ex-Partner: No    Emotionally Abused: No    Physically Abused: No    Sexually Abused: No    Review of Systems: Gen: Denies fever, sweats or chills. No weight loss.  CV: See HPI. Resp: Denies cough, shortness of breath of hemoptysis.  GI: See HPI. GU : Denies urinary burning, blood in urine, increased urinary frequency or incontinence. MS: + Lower back pain.  Derm: Denies rash, itchiness, skin lesions or unhealing ulcers. Psych: Denies depression, anxiety, memory loss or confusion. Heme: Denies easy bruising, bleeding. Neuro:  Denies headaches, dizziness or paresthesias. Endo:  Denies any problems with DM, thyroid or adrenal function.  Physical Exam: Vital signs in last 24 hours: Temp:  [98 F (36.7 C)-99.2 F (37.3 C)] 98.6 F (37 C) (12/05 1141) Pulse Rate:  [70-92] 70 (12/05 1141) Resp:  [16-20] 20 (12/05 1141) BP: (107-138)/(57-71) 108/57 (12/05 1141) SpO2:  [94 %-98 %] 94 % (12/05 1141) Weight:  [87.3 kg] 87.3 kg (12/05 0332) Last BM Date : 06/26/23 General:  Alert  52 year old male in no acute distress. Head:  Normocephalic and atraumatic. Eyes:  No scleral icterus. Conjunctiva pink. Ears:  Normal auditory acuity. Nose:  No deformity, discharge or lesions. Mouth: Absent dentition.  No ulcers or lesions.  Neck:  Supple. No lymphadenopathy or thyromegaly.  Lungs: Breath sounds clear throughout. No wheezes, rhonchi or crackles.  Heart: Regular rate and rhythm, 2/6 systolic murmur. Abdomen: Protuberant, nontender.  No hepatosplenomegaly.  No palpable mass.  Small umbilical hernia.  Positive bowel sounds to all 4 quadrants.  No bruit. Rectal: Deferred. Musculoskeletal:  Symmetrical without gross deformities.  Pulses:  Normal pulses noted. Extremities: Bilateral lower extremities with mild edema. Neurologic:  Alert and  oriented x 4. No focal deficits.  Ambulating in room. Skin:  Intact without significant lesions or rashes. Psych:  Alert and cooperative. Normal mood and affect.  Intake/Output from previous day: 12/04 0701 - 12/05 0700 In: 3130.7 [P.O.:894; I.V.:3; IV Piggyback:2233.7] Out: 4340 [Urine:4340] Intake/Output this shift: Total I/O In: 658.8 [P.O.:480; I.V.:3; IV Piggyback:175.8] Out: 1340 [Urine:1340]  Lab Results: Recent Labs    06/25/23 0232 06/25/23 1858 06/26/23 0226 06/27/23 0218  WBC 9.4  --  9.1 10.0  HGB 6.6* 8.7* 8.2* 8.6*  HCT 21.5* 27.9* 26.7* 27.0*  PLT 221  --  238 249   BMET Recent Labs    06/25/23 1858 06/26/23 0226 06/27/23 0218  NA 136 133* 131*  K 4.2 4.2 4.2  CL 105 103 96*  CO2 21*  23 24  GLUCOSE 110* 122* 114*  BUN 15 13 16   CREATININE 0.81 0.75 0.86  CALCIUM 8.9 8.6* 8.9   LFT Recent Labs    06/27/23 0218  PROT 7.1  ALBUMIN 2.5*  AST 98*  ALT 109*  ALKPHOS 67  BILITOT 0.6   PT/INR No results for input(s): "LABPROT", "INR" in the last 72 hours. Hepatitis Panel Recent Labs    06/27/23 0218  HEPBSAG NON REACTIVE  HCVAB NON REACTIVE  HEPAIGM NON REACTIVE  HEPBIGM NON REACTIVE     Studies/Results: US Abdomen Limited RUQ (LIVER/GB)  Result Date: 06/27/2023 CLINICAL DATA:  Elevated LFTs. EXAM: ULTRASOUND ABDOMEN LIMITED RIGHT UPPER QUADRANT COMPARISON:  None Available. FINDINGS: Gallbladder: No gallstones or wall thickening visualized. No sonographic Murphy sign noted by sonographer. Common bile duct: Diameter: 3-4 mm Liver: Heterogeneous coarsened hepatic echotexture with nodular contour. Portal vein is patent on color Doppler imaging with normal direction of blood flow towards the liver. Other: None. IMPRESSION: 1. Heterogeneous coarsened hepatic echotexture with nodular contour. Imaging features suggest cirrhosis. 2. No evidence for cholelithiasis or biliary ductal dilatation. Electronically Signed   By: Kennith Center M.D.   On: 06/27/2023 06:15    IMPRESSION/PLAN:  52 year old man admitted to the hospital with SOB and lower extremity edema secondary to strep bacteremia sepsis, mitral valve endocarditis with severe regurgitation and acute diastolic heart failure. On Pen G infusion. Successful diuresis, on Furosemide. Mitral valve replacement surgery scheduled 07/02/2023.  Iron deficiency anemia.  Admission Hg 8.3 -> 6.6 on 12/3 -> transfused 1 unit of PRBCs -> Hg Hg 8.7 -> 8.2 -> 8.7.  No overt GI bleeding.  Patient reported passing black stool x 1 after taking oral steroids x 5 days for lower back pain 1 month ago.  Cardiovascular surgery requesting endoscopic evaluation prior to undergoing mitral valve replacement surgery -Pantoprazole 40 mg p.o. daily -Transfuse for hemoglobin level < 8 -EGD and colonoscopy tomorrow, benefits and risks discussed including risk with sedation, risk of bleeding, perforation and infection, timing to be determined. -Clear liquid diet  -NPO after midnight  -Bowel prep ordered  -IV fluids per the hospitalist  Mildly elevated LFTs.  LFTs drifting downward today.  AST 129 -> 98. ALT 113 -> 109. Normal T. Bili and Alk phos levels.  Patient  drinks two 24 ounce beers weekly.  Negative acute hepatitis panel.  RUQ sonogram today showed heterogeneous coarsened hepatic echotexture with nodular contour suggestive of cirrhosis. CTAP 01/2021 showed hepatic steatosis with probable early cirrhosis. 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 -PT/INR, ANA, AMA, SMA, IgG, hepatitis B core total antibody, hepatitis B surface antibody, hepatitis A total antibody, ceruloplasmin and alpha 1 antitrypsin -Patient counseled complete alcohol abstinence -Continue folate, thiamine and multivitamin daily -Eventual abdominal CT versus liver MRI  Hyponatremia secondary to suspected cirrhosis and Furosemide -BMP in am    Arnaldo Natal  06/27/2023, 3:30PM

## 2023-06-27 NOTE — Progress Notes (Signed)
Mobility Specialist Progress Note:    06/27/23 1028  Mobility  Activity Ambulated with assistance in hallway  Level of Assistance Standby assist, set-up cues, supervision of patient - no hands on  Assistive Device None  Distance Ambulated (ft) 260 ft  Activity Response Tolerated well  Mobility Referral Yes  $Mobility charge 1 Mobility  Mobility Specialist Start Time (ACUTE ONLY) 0905  Mobility Specialist Stop Time (ACUTE ONLY) [id number]  Mobility Specialist Time Calculation (min) (ACUTE ONLY) 13 min   Received pt in bed having no complaints and agreeable to mobility. Pt was asymptomatic throughout ambulation and returned to room w/o fault. Left seated EOB w/ call bell in reach and all needs met.   Thompson Grayer Mobility Specialist  Please contact vis Secure Chat or  Rehab Office [phone]

## 2023-06-27 NOTE — TOC Progression Note (Signed)
Transition of Care [facility]) - Progression Note    Patient Details  Name: Victor [name] MRN: [id number] Date of Birth: [birth year]  Transition of [facility]) CM/SW Contact  Michaela Corner, [place] Phone Number: 06/27/2023, 12:56 PM  Clinical Narrative:   CSW met pt at bedside to discuss SDOH needs. Pt states he has plenty of food and would need assistance with transportation, but his dtr helps set up transportation for him. CSW asked pt if he would like a resource packet, pt agreed.          Expected Discharge Plan and Services                                               Social Determinants of Health (SDOH) Interventions SDOH Screenings   Food Insecurity: Food Insecurity Present (06/18/2023)  Housing: Medium Risk (06/18/2023)  Transportation Needs: Unmet Transportation Needs (06/18/2023)  Utilities: Not At Risk (06/18/2023)  Depression (PHQ2-9): Medium Risk (05/07/2023)  Tobacco Use: High Risk (06/21/2023)    Readmission Risk Interventions     No data to display

## 2023-06-27 NOTE — Progress Notes (Signed)
PROGRESS NOTE    Victor [name]  VQQ:[id number] DOB: Jun 22, 1971 DOA: 06/17/2023 PCP: Claiborne Rigg, NP   Brief Narrative:  52 years old male with past medical history of hyperlipidemia, obesity, hypertension presented to hospital with exertional shortness of breath, bilateral lower extremity edema and pain for the last 2 weeks.  Patient also complains of shortness of breath at rest and has been having difficulty lying down in bed. Further workup was done and he was found to have a a Strepotcoccal Bacteremmia and Mitral Valve Endocarditis. 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  Cardiac MRI suspicious for hypertrophic cardiomyopathy. Cardiac catheterization done and he had minimal nonobstructive CAD with elevated V waves and filling pressures as well as a subaortic gradient with increased PVCs suggesting potential hyperacute trophic cardiomyopathy.  Cardiothoracic surgery on board, plan for surgery next week Tuesday.   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 Lasix Continue metoprolol Currently he is posted fo his for surgical intervention with Dr. Leafy Ro on Tuesday 12/10 (MVR/subaortic myectomy) but TCTS requiring improvement in his Hgb prior to then   History of Hypertension Continue metoprolol   Streptococcal Salivarius Bacteremia and severe mitral valve regurgitation with mitral valve endocarditis Blood Cx x2 done and showed GPC in Chains and was showing Streptococcal Species in 4/4 Cx's with Sensitivities pending; Repeat Blood Cx 06/20/23  showing NGTD at 5 Days ID on board, continue IV penicillin  HLD Hold statins for now due to abnormal LFTs   Liver cirrhosis, likely compensated Elevated liver enzymes RUQ U/S with noted liver cirrhosis  Acute Hepatitis Panel nonreactive GI consulted, appreciate recs   Hyponatremia Uncertain etiology at this time but likely in the setting of volume overload Checked TSH 1.163.   Daily CMP   Alcohol Use Disorder Drinks 16 ounce beer probably every day.  Denies any withdrawal symptoms.   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 ID on board, MRI Lumbar Spine with Contrast and showed "Reactive endplate change with endplate enhancement about the L5-S1 interspace, favored to be degenerative in nature, although possible changes of infection are difficult to exclude, and could be considered in the correct clinical setting Neurosurgery team is evaluated the imaging and feels that no neurosurgical intervention is needed and they are just recommending continuing antibiotics  ID recommends continuing treatment with IV antibiotics as above  Normocytic Anemia with Acute Drop on 12/3 Globin dropped to 6.6, s/p 1 unit of PRBC FOBT negative Continue PPI with Pantoprazole Hold ASA for now as well as NSAIDs Anemia panel was done and showed an iron level of 18, UIBC of 237, TIBC 255, saturation ratios of 7%, ferritin of 248, folate level 9.8, vitamin B12 level 540 Continue to Monitor for S/Sx of Bleeding; no overt bleeding noted Repeat CBC in the AM GI consulted, appreciate recs, plan for EGD and colonoscopy to evaluate anemia prior to surgery   Obesity -Complicates overall prognosis and care -  Estimated body mass index is 29.94 kg/m as calculated from the following:   Height as  of this encounter: 5\' 7"  (1.702 m).   Weight as of this encounter: 86.7 kg.  -Weight Loss and Dietary Counseling given     DVT prophylaxis: enoxaparin (LOVENOX) injection 40 mg Start: 06/25/23 0800 SCDs Start: 06/18/23 0858    Code Status: Full Code Family Communication: None at bedside  Disposition Plan:  Level of care: Telemetry Cardiac Status is: Inpatient Remains inpatient appropriate because: Needs further clinical improvement and clearance by specialist as he is undergoing surgical intervention next week   Consultants:  Cardiology Infectious Diseases Cardiothoracic Surgery Discussed with Neurosurgery  Procedures:    RIGHT AND LEFT CARDIAC CATHETERIZATION AND CORONARY ANGIOGRAPHY    Antimicrobials:  Anti-infectives (From admission, onward)    Start     Dose/Rate Route Frequency Ordered Stop   06/24/23 2200  penicillin G potassium 12 Million Units in dextrose 5 % 500 mL CONTINUOUS infusion        12 Million Units 41.7 mL/hr over 12 Hours Intravenous Every 12 hours 06/24/23 1522     06/21/23 1115  cefTRIAXone (ROCEPHIN) 2 g in sodium chloride 0.9 % 100 mL IVPB  Status:  Discontinued        2 g 200 mL/hr over 30 Minutes Intravenous Every 24 hours 06/21/23 1017 06/24/23 1522   06/19/23 1600  vancomycin (VANCOREADY) IVPB 1250 mg/250 mL  Status:  Discontinued        1,250 mg 166.7 mL/hr over 90 Minutes Intravenous Every 12 hours 06/19/23 0323 06/21/23 1017   06/19/23 1200  ceFEPIme (MAXIPIME) 2 g in sodium chloride 0.9 % 100 mL IVPB  Status:  Discontinued        2 g 200 mL/hr over 30 Minutes Intravenous Every 8 hours 06/19/23 0323 06/20/23 1156   06/19/23 0145  vancomycin (VANCOREADY) IVPB 1750 mg/350 mL        1,750 mg 175 mL/hr over 120 Minutes Intravenous  Once 06/19/23 0051 06/19/23 1044   06/19/23 0145  ceFEPIme (MAXIPIME) 2 g in sodium chloride 0.9 % 100 mL IVPB        2 g 200 mL/hr over 30 Minutes Intravenous  Once 06/19/23 0051 06/19/23 0908   06/19/23 0045   metroNIDAZOLE (FLAGYL) IVPB 500 mg  Status:  Discontinued        500 mg 100 mL/hr over 60 Minutes Intravenous Every 12 hours 06/19/23 0041 06/20/23 1156       Subjective: Seen examined at bedside and was doing okay.  Blood count had dropped for unclear reasons still he has been typed and screened and transfused 1 unit of PRBCs.  No nausea or vomiting.  Feels okay.  No other concerns or points this time.  Objective: Vitals:   06/27/23 0428 06/27/23 0713 06/27/23 1141 06/27/23 1513  BP: 112/66 107/66 (!) 108/57 132/82  Pulse:  90 70 80  Resp: 16 20 20 20   Temp: 98.9 F (37.2 C) 98.6 F (37 C) 98.6 F (37 C) 98.2 F (36.8 C)  TempSrc: Oral Oral Oral Oral  SpO2: 95% 95% 94% 100%  Weight:      Height:        Intake/Output Summary (Last 24 hours) at 06/27/2023 2017 Last data filed at 06/27/2023 1859 Gross per 24 hour  Intake 5809.21 ml  Output 3640 ml  Net 2169.21 ml   Filed Weights   06/25/23 0520 06/26/23 0456 06/27/23 0332  Weight: 86.7 kg 87.4 kg 87.3 kg  Examination: Physical Exam: General: NAD  Cardiovascular: S1, S2 present Respiratory: CTAB Abdomen: Soft, nontender, nondistended, bowel sounds present Musculoskeletal: No bilateral pedal edema noted Skin: Normal Psychiatry: Normal mood    Data Reviewed: I have personally reviewed following labs and imaging studies  CBC: Recent Labs  Lab 06/23/23 0230 06/24/23 0301 06/24/23 0948 06/24/23 0950 06/25/23 0232 06/25/23 1858 06/26/23 0226 06/27/23 0218  WBC 9.5 8.4  --   --  9.4  --  9.1 10.0  NEUTROABS 6.5 5.7  --   --  7.2  --  6.4 6.7  HGB 7.5* 7.3*   < > 7.8* 6.6* 8.7* 8.2* 8.6*  HCT 24.0* 24.0*   < > 23.0* 21.5* 27.9* 26.7* 27.0*  MCV 93.8 94.9  --   --  94.3  --  93.0 91.8  PLT 226 221  --   --  221  --  238 249   < > = values in this interval not displayed.   Basic Metabolic Panel: Recent Labs  Lab 06/22/23 0210 06/23/23 0230 06/24/23 0301 06/24/23 0948 06/24/23 0950 06/25/23 0232  06/25/23 1858 06/26/23 0226 06/27/23 0218  NA 131* 132* 132*   < > 135 130* 136 133* 131*  K 4.3 4.5 4.1   < > 4.2 5.3* 4.2 4.2 4.2  CL 101 101 103  --   --  102 105 103 96*  CO2 24 21* 21*  --   --  20* 21* 23 24  GLUCOSE 120* 121* 123*  --   --  235* 110* 122* 114*  BUN 15 15 15   --   --  16 15 13 16   CREATININE 0.74 0.79 0.63  --   --  0.83 0.81 0.75 0.86  CALCIUM 8.7* 8.9 8.7*  --   --  8.5* 8.9 8.6* 8.9  MG 2.2 2.0 2.0  --   --  2.0  --  1.9  --   PHOS 4.5 3.8 4.0  --   --  3.7  --  4.1  --    < > = values in this interval not displayed.   GFR: Estimated Creatinine Clearance: 103.6 mL/min (by C-G formula based on SCr of 0.86 mg/dL). Liver Function Tests: Recent Labs  Lab 06/24/23 0301 06/25/23 0232 06/25/23 1858 06/26/23 0226 06/27/23 0218  AST 84* 94* 139* 129* 98*  ALT 63* 75* 111* 113* 109*  ALKPHOS 72 64 72 68 67  BILITOT 0.3 0.4 0.6 0.4 0.6  PROT 7.2 6.9 7.8 7.2 7.1  ALBUMIN 2.5* 2.4* 2.7* 2.4* 2.5*   No results for input(s): "LIPASE", "AMYLASE" in the last 168 hours. No results for input(s): "AMMONIA" in the last 168 hours. Coagulation Profile: Recent Labs  Lab 06/27/23 1604  INR 1.2   Cardiac Enzymes: No results for input(s): "CKTOTAL", "CKMB", "CKMBINDEX", "TROPONINI" in the last 168 hours. BNP (last 3 results) No results for input(s): "PROBNP" in the last 8760 hours. HbA1C: No results for input(s): "HGBA1C" in the last 72 hours. CBG: No results for input(s): "GLUCAP" in the last 168 hours. Lipid Profile: No results for input(s): "CHOL", "HDL", "LDLCALC", "TRIG", "CHOLHDL", "LDLDIRECT" in the last 72 hours. Thyroid Function Tests: No results for input(s): "TSH", "T4TOTAL", "FREET4", "T3FREE", "THYROIDAB" in the last 72 hours. Anemia Panel: No results for input(s): "VITAMINB12", "FOLATE", "FERRITIN", "TIBC", "IRON", "RETICCTPCT" in the last 72 hours. Sepsis Labs: No results for input(s): "PROCALCITON", "LATICACIDVEN" in the last 168 hours.  Recent  Results (from the past 240 hour(s))  Culture,  blood (Routine X 2) w Reflex to ID Panel     Status: Abnormal   Collection Time: 06/19/23  2:39 AM   Specimen: BLOOD  Result Value Ref Range Status   Specimen Description BLOOD BLOOD LEFT ARM  Final   Special Requests   Final    BOTTLES DRAWN AEROBIC AND ANAEROBIC Blood Culture adequate volume   Culture  Setup Time   Final    GRAM POSITIVE COCCI IN CHAINS IN BOTH AEROBIC AND ANAEROBIC BOTTLES CRITICAL RESULT CALLED TO, READ BACK BY AND VERIFIED WITH: PHARMD JAMES LEDFORD ON 06/19/23 @ 2313 BY DRT Performed at [facility], [address] [place], [place] [address]    Culture STREPTOCOCCUS SALIVARIUS (A)  Final   Report Status 06/22/2023 FINAL  Final   Organism ID, Bacteria STREPTOCOCCUS SALIVARIUS  Final      Susceptibility   Streptococcus salivarius - MIC*    PENICILLIN <=0.06 SENSITIVE Sensitive     CEFTRIAXONE <=0.12 SENSITIVE Sensitive     ERYTHROMYCIN <=0.12 SENSITIVE Sensitive     LEVOFLOXACIN 2 SENSITIVE Sensitive     VANCOMYCIN 0.5 SENSITIVE Sensitive     * STREPTOCOCCUS SALIVARIUS  Culture, blood (Routine X 2) w Reflex to ID Panel     Status: Abnormal   Collection Time: 06/19/23  2:41 AM   Specimen: BLOOD  Result Value Ref Range Status   Specimen Description BLOOD BLOOD RIGHT ARM  Final   Special Requests   Final    BOTTLES DRAWN AEROBIC AND ANAEROBIC Blood Culture adequate volume   Culture  Setup Time   Final    GRAM POSITIVE COCCI IN CHAINS IN BOTH AEROBIC AND ANAEROBIC BOTTLES CRITICAL RESULT CALLED TO, READ BACK BY AND VERIFIED WITH: PHARMD JAMES LEDFORD ON 06/19/23 @ 2313 BY DRT    Culture (A)  Final    STREPTOCOCCUS SALIVARIUS SUSCEPTIBILITIES PERFORMED ON PREVIOUS CULTURE WITHIN THE LAST 5 DAYS.    Report Status 06/22/2023 FINAL  Final  Blood Culture ID Panel (Reflexed)     Status: Abnormal   Collection Time: 06/19/23  2:41 AM  Result Value Ref Range Status   Enterococcus faecalis NOT DETECTED NOT  DETECTED Final   Enterococcus Faecium NOT DETECTED NOT DETECTED Final   Listeria monocytogenes NOT DETECTED NOT DETECTED Final   Staphylococcus species NOT DETECTED NOT DETECTED Final   Staphylococcus aureus (BCID) NOT DETECTED NOT DETECTED Final   Staphylococcus epidermidis NOT DETECTED NOT DETECTED Final   Staphylococcus lugdunensis NOT DETECTED NOT DETECTED Final   Streptococcus species DETECTED (A) NOT DETECTED Final    Comment: Not Enterococcus species, Streptococcus agalactiae, Streptococcus pyogenes, or Streptococcus pneumoniae. CRITICAL RESULT CALLED TO, READ BACK BY AND VERIFIED WITH: PHARMD JAMES LEDFORD ON 06/19/23 @ 2313 BY DRT    Streptococcus agalactiae NOT DETECTED NOT DETECTED Final   Streptococcus pneumoniae NOT DETECTED NOT DETECTED Final   Streptococcus pyogenes NOT DETECTED NOT DETECTED Final   A.calcoaceticus-baumannii NOT DETECTED NOT DETECTED Final   Bacteroides fragilis NOT DETECTED NOT DETECTED Final   Enterobacterales NOT DETECTED NOT DETECTED Final   Enterobacter cloacae complex NOT DETECTED NOT DETECTED Final   Escherichia coli NOT DETECTED NOT DETECTED Final   Klebsiella aerogenes NOT DETECTED NOT DETECTED Final   Klebsiella oxytoca NOT DETECTED NOT DETECTED Final   Klebsiella pneumoniae NOT DETECTED NOT DETECTED Final   Proteus species NOT DETECTED NOT DETECTED Final   Salmonella species NOT DETECTED NOT DETECTED Final   Serratia marcescens NOT DETECTED NOT DETECTED Final   Haemophilus  influenzae NOT DETECTED NOT DETECTED Final   Neisseria meningitidis NOT DETECTED NOT DETECTED Final   Pseudomonas aeruginosa NOT DETECTED NOT DETECTED Final   Stenotrophomonas maltophilia NOT DETECTED NOT DETECTED Final   Candida albicans NOT DETECTED NOT DETECTED Final   Candida auris NOT DETECTED NOT DETECTED Final   Candida glabrata NOT DETECTED NOT DETECTED Final   Candida krusei NOT DETECTED NOT DETECTED Final   Candida parapsilosis NOT DETECTED NOT DETECTED Final    Candida tropicalis NOT DETECTED NOT DETECTED Final   Cryptococcus neoformans/gattii NOT DETECTED NOT DETECTED Final    Comment: Performed at [facility], [address], [place], [place] [address]  SARS Coronavirus 2 by RT PCR (hospital order, performed in [facility] hospital lab) *cepheid single result test* Anterior Nasal Swab     Status: None   Collection Time: 06/19/23  2:45 AM   Specimen: Anterior Nasal Swab  Result Value Ref Range Status   SARS Coronavirus 2 by RT PCR NEGATIVE NEGATIVE Final    Comment: Performed at [facility], [address], [place], [place] [address]  Respiratory (~20 pathogens) panel by PCR     Status: None   Collection Time: 06/19/23  2:45 AM   Specimen: Nasopharyngeal Swab; Respiratory  Result Value Ref Range Status   Adenovirus NOT DETECTED NOT DETECTED Final   Coronavirus 229E NOT DETECTED NOT DETECTED Final    Comment: (NOTE) The Coronavirus on the Respiratory Panel, DOES NOT test for the novel  Coronavirus (2019 nCoV)    Coronavirus HKU1 NOT DETECTED NOT DETECTED Final   Coronavirus NL63 NOT DETECTED NOT DETECTED Final   Coronavirus OC43 NOT DETECTED NOT DETECTED Final   Metapneumovirus NOT DETECTED NOT DETECTED Final   Rhinovirus / Enterovirus NOT DETECTED NOT DETECTED Final   Influenza A NOT DETECTED NOT DETECTED Final   Influenza B NOT DETECTED NOT DETECTED Final   Parainfluenza Virus 1 NOT DETECTED NOT DETECTED Final   Parainfluenza Virus 2 NOT DETECTED NOT DETECTED Final   Parainfluenza Virus 3 NOT DETECTED NOT DETECTED Final   Parainfluenza Virus 4 NOT DETECTED NOT DETECTED Final   Respiratory Syncytial Virus NOT DETECTED NOT DETECTED Final   Bordetella pertussis NOT DETECTED NOT DETECTED Final   Bordetella Parapertussis NOT DETECTED NOT DETECTED Final   Chlamydophila pneumoniae NOT DETECTED NOT DETECTED Final   Mycoplasma pneumoniae NOT DETECTED NOT DETECTED Final    Comment: Performed at [facility], [address] [place], [place] [address]  Culture, blood (Routine X 2) w Reflex to ID Panel     Status: None   Collection Time: 06/20/23  9:40 AM   Specimen: BLOOD LEFT ARM  Result Value Ref Range Status   Specimen Description BLOOD LEFT ARM  Final   Special Requests   Final    BOTTLES DRAWN AEROBIC AND ANAEROBIC Blood Culture adequate volume   Culture   Final    NO GROWTH 5 DAYS Performed at [facility], [address] [place], [place] [address]    Report Status 06/25/2023 FINAL  Final  Culture, blood (Routine X 2) w Reflex to ID Panel     Status: None   Collection Time: 06/20/23  9:49 AM   Specimen: BLOOD RIGHT HAND  Result Value Ref Range Status   Specimen Description BLOOD RIGHT HAND  Final   Special Requests   Final    BOTTLES DRAWN AEROBIC AND ANAEROBIC Blood Culture adequate volume   Culture   Final  NO GROWTH 5 DAYS Performed at [facility], [address], [place], [place] [address]    Report Status 06/25/2023 FINAL  Final    Radiology Studies: US Abdomen Limited RUQ (LIVER/GB)  Result Date: 06/27/2023 CLINICAL DATA:  Elevated LFTs. EXAM: ULTRASOUND ABDOMEN LIMITED RIGHT UPPER QUADRANT COMPARISON:  None Available. FINDINGS: Gallbladder: No gallstones or wall thickening visualized. No sonographic Murphy sign noted by sonographer. Common bile duct: Diameter: 3-4 mm Liver: Heterogeneous coarsened hepatic echotexture with nodular contour. Portal vein is patent on color Doppler imaging with normal direction of blood flow towards the liver. Other: None. IMPRESSION: 1. Heterogeneous coarsened hepatic echotexture with nodular contour. Imaging features suggest cirrhosis. 2. No evidence for cholelithiasis or biliary ductal dilatation. Electronically Signed   By: Kennith Center M.D.   On: 06/27/2023 06:15    Scheduled Meds:  docusate sodium  100 mg Oral BID   enoxaparin (LOVENOX) injection  40 mg Subcutaneous Q24H   folic acid  1 mg Oral Daily   furosemide  40 mg Oral BID    gabapentin  300 mg Oral TID   methocarbamol  500 mg Oral TID   metoprolol tartrate  50 mg Oral BID   multivitamin with minerals  1 tablet Oral Daily   pantoprazole  40 mg Oral Daily   peg 3350 powder  0.5 kit Oral Once   And   [START ON 06/28/2023] peg 3350 powder  0.5 kit Oral Once   sodium chloride flush  3 mL Intravenous Q12H   thiamine  100 mg Oral Daily   Continuous Infusions:  penicillin G potassium 12 Million Units in dextrose 5 % 500 mL CONTINUOUS infusion 41.7 mL/hr at 06/27/23 1724    LOS: 9 days   Briant Cedar, MD [facility] Available via Epic secure chat 7am-7pm After these hours, please refer to coverage provider listed on [email or web address] 06/27/2023, 8:17 PM

## 2023-06-28 ENCOUNTER — Inpatient Hospital Stay (HOSPITAL_COMMUNITY): Payer: Medicaid Other | Admitting: Anesthesiology

## 2023-06-28 ENCOUNTER — Encounter (HOSPITAL_COMMUNITY)
Admission: EM | Disposition: A | Discharge status: Home Health | Payer: Self-pay | Source: Home / Self Care | Attending: Internal Medicine

## 2023-06-28 ENCOUNTER — Encounter (HOSPITAL_COMMUNITY): Payer: Self-pay | Admitting: Internal Medicine

## 2023-06-28 DIAGNOSIS — R748 Abnormal levels of other serum enzymes: Secondary | ICD-10-CM | POA: Diagnosis not present

## 2023-06-28 DIAGNOSIS — D509 Iron deficiency anemia, unspecified: Secondary | ICD-10-CM | POA: Diagnosis not present

## 2023-06-28 DIAGNOSIS — K5730 Diverticulosis of large intestine without perforation or abscess without bleeding: Secondary | ICD-10-CM | POA: Diagnosis not present

## 2023-06-28 DIAGNOSIS — I5031 Acute diastolic (congestive) heart failure: Secondary | ICD-10-CM | POA: Diagnosis not present

## 2023-06-28 DIAGNOSIS — I110 Hypertensive heart disease with heart failure: Secondary | ICD-10-CM | POA: Diagnosis not present

## 2023-06-28 DIAGNOSIS — K3189 Other diseases of stomach and duodenum: Secondary | ICD-10-CM

## 2023-06-28 DIAGNOSIS — K2970 Gastritis, unspecified, without bleeding: Secondary | ICD-10-CM | POA: Diagnosis not present

## 2023-06-28 DIAGNOSIS — D120 Benign neoplasm of cecum: Secondary | ICD-10-CM

## 2023-06-28 DIAGNOSIS — D126 Benign neoplasm of colon, unspecified: Secondary | ICD-10-CM

## 2023-06-28 DIAGNOSIS — D123 Benign neoplasm of transverse colon: Secondary | ICD-10-CM | POA: Diagnosis not present

## 2023-06-28 DIAGNOSIS — I340 Nonrheumatic mitral (valve) insufficiency: Secondary | ICD-10-CM | POA: Diagnosis not present

## 2023-06-28 DIAGNOSIS — R0602 Shortness of breath: Secondary | ICD-10-CM | POA: Diagnosis not present

## 2023-06-28 DIAGNOSIS — I330 Acute and subacute infective endocarditis: Secondary | ICD-10-CM | POA: Diagnosis not present

## 2023-06-28 DIAGNOSIS — K6389 Other specified diseases of intestine: Secondary | ICD-10-CM | POA: Diagnosis not present

## 2023-06-28 DIAGNOSIS — R932 Abnormal findings on diagnostic imaging of liver and biliary tract: Secondary | ICD-10-CM | POA: Diagnosis not present

## 2023-06-28 HISTORY — PX: SUBMUCOSAL TATTOO INJECTION: SHX6856

## 2023-06-28 HISTORY — PX: COLONOSCOPY WITH PROPOFOL: SHX5780

## 2023-06-28 HISTORY — PX: POLYPECTOMY: SHX5525

## 2023-06-28 HISTORY — PX: SUBMUCOSAL LIFTING INJECTION: SHX6855

## 2023-06-28 HISTORY — PX: BIOPSY: SHX5522

## 2023-06-28 HISTORY — PX: ESOPHAGOGASTRODUODENOSCOPY (EGD) WITH PROPOFOL: SHX5813

## 2023-06-28 LAB — COMPREHENSIVE METABOLIC PANEL
ALT: 91 U/L — ABNORMAL HIGH (ref 0–44)
AST: 67 U/L — ABNORMAL HIGH (ref 15–41)
Albumin: 2.6 g/dL — ABNORMAL LOW (ref 3.5–5.0)
Alkaline Phosphatase: 77 U/L (ref 38–126)
Anion gap: 10 (ref 5–15)
BUN: 11 mg/dL (ref 6–20)
CO2: 23 mmol/L (ref 22–32)
Calcium: 9.3 mg/dL (ref 8.9–10.3)
Chloride: 99 mmol/L (ref 98–111)
Creatinine, Ser: 0.68 mg/dL (ref 0.61–1.24)
GFR, Estimated: >60 mL/min (ref >60)
Glucose, Bld: 117 mg/dL — ABNORMAL HIGH (ref 70–99)
Potassium: 4.1 mmol/L (ref 3.5–5.1)
Sodium: 132 mmol/L — ABNORMAL LOW (ref 135–145)
Total Bilirubin: 0.6 mg/dL (ref <1.2)
Total Protein: 7.2 g/dL (ref 6.5–8.1)

## 2023-06-28 LAB — CBC WITH DIFFERENTIAL/PLATELET
Abs Immature Granulocytes: 0.05 10*3/uL (ref 0.00–0.07)
Basophils Absolute: 0 10*3/uL (ref 0.0–0.1)
Basophils Relative: 0 %
Eosinophils Absolute: 0.2 10*3/uL (ref 0.0–0.5)
Eosinophils Relative: 2 %
HCT: 26.9 % — ABNORMAL LOW (ref 39.0–52.0)
Hemoglobin: 8.3 g/dL — ABNORMAL LOW (ref 13.0–17.0)
Immature Granulocytes: 1 %
Lymphocytes Relative: 20 %
Lymphs Abs: 2 10*3/uL (ref 0.7–4.0)
MCH: 28.5 pg (ref 26.0–34.0)
MCHC: 30.9 g/dL (ref 30.0–36.0)
MCV: 92.4 fL (ref 80.0–100.0)
Monocytes Absolute: 0.6 10*3/uL (ref 0.1–1.0)
Monocytes Relative: 6 %
Neutro Abs: 6.9 10*3/uL (ref 1.7–7.7)
Neutrophils Relative %: 71 %
Platelets: 242 10*3/uL (ref 150–400)
RBC: 2.91 MIL/uL — ABNORMAL LOW (ref 4.22–5.81)
RDW: 16 % — ABNORMAL HIGH (ref 11.5–15.5)
WBC: 9.8 10*3/uL (ref 4.0–10.5)
nRBC: 0 % (ref 0.0–0.2)

## 2023-06-28 LAB — AFP TUMOR MARKER: AFP, Serum, Tumor Marker: 3.7 ng/mL (ref 0.0–8.4)

## 2023-06-28 LAB — MITOCHONDRIAL ANTIBODIES: Mitochondrial M2 Ab, IgG: <20 U (ref 0.0–20.0)

## 2023-06-28 LAB — ALPHA-1-ANTITRYPSIN: A-1 Antitrypsin, Ser: 274 mg/dL — ABNORMAL HIGH (ref 101–187)

## 2023-06-28 LAB — CERULOPLASMIN: Ceruloplasmin: 45.8 mg/dL — ABNORMAL HIGH (ref 16.0–31.0)

## 2023-06-28 LAB — ANTI-SMOOTH MUSCLE ANTIBODY, IGG: F-Actin IgG: 6 U (ref 0–19)

## 2023-06-28 LAB — ANA: Anti Nuclear Antibody (ANA): POSITIVE — AB

## 2023-06-28 LAB — IGG: IgG (Immunoglobin G), Serum: 1426 mg/dL (ref 603–1613)

## 2023-06-28 SURGERY — ESOPHAGOGASTRODUODENOSCOPY (EGD) WITH PROPOFOL
Anesthesia: Monitor Anesthesia Care

## 2023-06-28 MED ORDER — FLUCONAZOLE 200 MG PO TABS
400.0000 mg | ORAL_TABLET | Freq: Once | ORAL | Status: AC
  Administered 2023-06-28: 400 mg via ORAL
  Filled 2023-06-28: qty 2

## 2023-06-28 MED ORDER — SPOT INK MARKER SYRINGE KIT
PACK | SUBMUCOSAL | Status: DC | PRN
  Administered 2023-06-28: 3 mL via SUBMUCOSAL

## 2023-06-28 MED ORDER — PROPOFOL 10 MG/ML IV BOLUS
INTRAVENOUS | Status: DC | PRN
  Administered 2023-06-28: 10 mg via INTRAVENOUS
  Administered 2023-06-28: 30 mg via INTRAVENOUS
  Administered 2023-06-28: 10 mg via INTRAVENOUS
  Administered 2023-06-28: 20 mg via INTRAVENOUS

## 2023-06-28 MED ORDER — FLUCONAZOLE 100 MG PO TABS
200.0000 mg | ORAL_TABLET | Freq: Every day | ORAL | Status: AC
  Administered 2023-06-29 – 2023-07-11 (×12): 200 mg via ORAL
  Filled 2023-06-28 (×4): qty 2
  Filled 2023-06-28: qty 1
  Filled 2023-06-28 (×3): qty 2
  Filled 2023-06-28 (×2): qty 1
  Filled 2023-06-28 (×2): qty 2
  Filled 2023-06-28: qty 1
  Filled 2023-06-28: qty 2

## 2023-06-28 MED ORDER — PROPOFOL 500 MG/50ML IV EMUL
INTRAVENOUS | Status: DC | PRN
  Administered 2023-06-28: 20 ug/kg/min via INTRAVENOUS
  Administered 2023-06-28: 50 ug/kg/min via INTRAVENOUS

## 2023-06-28 MED ORDER — LIDOCAINE HCL (PF) 2 % IJ SOLN
INTRAMUSCULAR | Status: DC | PRN
  Administered 2023-06-28: 40 mg via INTRADERMAL

## 2023-06-28 MED ORDER — PHENYLEPHRINE HCL (PRESSORS) 10 MG/ML IV SOLN
INTRAVENOUS | Status: DC | PRN
  Administered 2023-06-28 (×3): 40 ug via INTRAVENOUS
  Administered 2023-06-28 (×2): 80 ug via INTRAVENOUS
  Administered 2023-06-28: 40 ug via INTRAVENOUS
  Administered 2023-06-28: 80 ug via INTRAVENOUS
  Administered 2023-06-28 (×2): 40 ug via INTRAVENOUS
  Administered 2023-06-28 (×3): 80 ug via INTRAVENOUS

## 2023-06-28 MED ORDER — SODIUM CHLORIDE 0.9 % IV SOLN
INTRAVENOUS | Status: AC | PRN
  Administered 2023-06-28: 500 mL via INTRAMUSCULAR

## 2023-06-28 SURGICAL SUPPLY — 24 items
BLOCK BITE 60FR ADLT L/F BLUE (MISCELLANEOUS) ×3 IMPLANT
ELECT REM PT RETURN 9FT ADLT (ELECTROSURGICAL) IMPLANT
ELECTRODE REM PT RTRN 9FT ADLT (ELECTROSURGICAL) IMPLANT
FCP BXJMBJMB 240X2.8X (CUTTING FORCEPS)
FLOOR PAD 36X40 (MISCELLANEOUS) ×2 IMPLANT
FORCEP RJ3 GP 1.8X160 W-NEEDLE (CUTTING FORCEPS) IMPLANT
FORCEPS BIOP RAD 4 LRG CAP 4 (CUTTING FORCEPS) IMPLANT
FORCEPS BIOP RJ4 240 W/NDL (CUTTING FORCEPS) IMPLANT
FORCEPS BXJMBJMB 240X2.8X (CUTTING FORCEPS) IMPLANT
INJECTOR/SNARE I SNARE (MISCELLANEOUS) IMPLANT
LUBRICANT JELLY 4.5OZ STERILE (MISCELLANEOUS) IMPLANT
MANIFOLD NEPTUNE II (INSTRUMENTS) IMPLANT
NDL SCLEROTHERAPY 25GX240 (NEEDLE) IMPLANT
NEEDLE SCLEROTHERAPY 25GX240 (NEEDLE) IMPLANT
PAD FLOOR 36X40 (MISCELLANEOUS) ×3 IMPLANT
PROBE APC STR FIRE (PROBE) IMPLANT
PROBE INJECTION GOLD 7FR (MISCELLANEOUS) IMPLANT
SNARE ROTATE MED OVAL 20MM (MISCELLANEOUS) IMPLANT
SNARE SHORT THROW 13M SML OVAL (MISCELLANEOUS) IMPLANT
SYR 50ML LL SCALE MARK (SYRINGE) IMPLANT
TRAP SPECIMEN MUCOUS 40CC (MISCELLANEOUS) IMPLANT
TUBING ENDO SMARTCAP PENTAX (MISCELLANEOUS) ×6 IMPLANT
TUBING IRRIGATION ENDOGATOR (MISCELLANEOUS) ×3 IMPLANT
WATER STERILE IRR 1000ML POUR (IV SOLUTION) IMPLANT

## 2023-06-28 NOTE — Progress Notes (Signed)
Physical Therapy Treatment Patient Details Name: Victor [name] MRN: [id number] DOB: [birth year] Today's Date: 06/28/2023   History of Present Illness 52 years old male with past medical history of hyperlipidemia, obesity, hypertension, and chronic back pain presented to hospital 11/26 with exertional shortness of breath, bilateral lower extremity edema and pain for the last 2 weeks.  Patient also complains of shortness of breath at rest and has been having difficulty lying down in bed.    PT Comments  Focused session on training pt to perform functional mobility while maintaining sternal precautions in preparation for anticipated need to follow sternal precautions after potential surgery early next week. Pt demonstrated good compliance to cuing throughout. No physical assistance needed at this time. He appears to be ambulating still mildly slow and cautiously, but this is gradually improving. Will continue to follow acutely. Pt verbalized he is being compliant with ambulating the halls and performing serial sit <> stand reps as instructed for HEP while here.     If plan is discharge home, recommend the following: A little help with bathing/dressing/bathroom;Help with stairs or ramp for entrance;Assist for transportation   Can travel by private vehicle        Equipment Recommendations  None recommended by PT    Recommendations for Other Services       Precautions / Restrictions Precautions Precautions: None Restrictions Weight Bearing Restrictions: No     Mobility  Bed Mobility Overal bed mobility: Needs Assistance Bed Mobility: Rolling, Sidelying to Sit, Sit to Sidelying Rolling: Independent Sidelying to sit: Supervision     Sit to sidelying: Supervision General bed mobility comments: Supervision for cuing pt to perform sidelying <> sit transition R EOB while maintaining sternal precautions in preparation for anticipated need to follow sternal precautions after potential surgery  early next week. Good compliance to cuing noted.    Transfers Overall transfer level: Independent Equipment used: None Transfers: Sit to/from Stand Sit to Stand: Independent           General transfer comment: Pt able to transfer to stand with hands on knees/chest to follow sternal precautions in preparation for anticipated need to follow sternal precautions after potential surgery early next week. Good compliance to cuing noted. No LOB    Ambulation/Gait Ambulation/Gait assistance: Modified independent (Device/Increase time) Gait Distance (Feet): 40 Feet Assistive device: None Gait Pattern/deviations: Step-through pattern, Decreased stride length Gait velocity: slowed Gait velocity interpretation: 1.31 - 2.62 ft/sec, indicative of limited community ambulator   General Gait Details: Pt ambulates still mildly slow and cautiously, but is able to gradually increase speed when cued without LOB. Pt reports he just ambulated the halls, thus limited session to bedroom mobility   Stairs             Wheelchair Mobility     Tilt Bed    Modified Rankin (Stroke Patients Only)       Balance Overall balance assessment: Mild deficits observed, not formally tested (very mild) Sitting-balance support: Feet supported Sitting balance-Leahy Scale: Good     Standing balance support: No upper extremity supported, During functional activity Standing balance-Leahy Scale: Good                              Cognition Arousal: Alert Behavior During Therapy: WFL for tasks assessed/performed Overall Cognitive Status: Within Functional Limits for tasks assessed  Exercises      General Comments General comments (skin integrity, edema, etc.): reviewed continued frequent mobility and sit <> stand reps while here; reviewed sternal precautions in preparation for anticipated need to follow sternal precautions after  potential surgery early next week      Pertinent Vitals/Pain Pain Assessment Pain Assessment: Faces Faces Pain Scale: No hurt Pain Intervention(s): Monitored during session    Home Living                          Prior Function            PT Goals (current goals can now be found in the care plan section) Acute Rehab PT Goals 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, get back to work painting PT Goal Formulation: With patient Time For Goal Achievement: 07/02/23 Potential to Achieve Goals: Good Progress towards PT goals: Progressing toward goals    Frequency    Min 1X/week      PT Plan      Co-evaluation              AM-PAC PT "6 Clicks" Mobility   Outcome Measure  Help needed turning from your back to your side while in a flat bed without using bedrails?: None Help needed moving from lying on your back to sitting on the side of a flat bed without using bedrails?: None Help needed moving to and from a bed to a chair (including a wheelchair)?: None Help needed standing up from a chair using your arms (e.g., wheelchair or bedside chair)?: None Help needed to walk in hospital room?: None Help needed climbing 3-5 steps with a railing? : None 6 Click Score: 24    End of Session   Activity Tolerance: Patient tolerated treatment well Patient left: with call bell/phone within reach;in chair   PT Visit Diagnosis: Unsteadiness on feet (R26.81);Difficulty in walking, not elsewhere classified (R26.2);Muscle weakness (generalized) (M62.81)     Time: 4098-1191 PT Time Calculation (min) (ACUTE ONLY): 15 min  Charges:    $Therapeutic Activity: 8-22 mins PT General Charges $$ ACUTE PT VISIT: 1 Visit                     Virgil Benedict, PT, DPT Acute Rehabilitation Services  Office: [phone]    Bettina Gavia 06/28/2023, 1:54 PM

## 2023-06-28 NOTE — Op Note (Signed)
[facility] Patient Name: Victor [name] Procedure Date : 06/28/2023 MRN: [id number] Attending MD: Willaim Rayas. Adela Lank , MD, [id number] Date of Birth: 1970-07-27 CSN: [id number] Age: 52 Admit Type: Inpatient Procedure:                Upper GI endoscopy Indications:              Iron deficiency anemia - pending MVR - EGD and                            colonoscopy to evaluate anemia preoperatively Providers:                Willaim Rayas. Adela Lank, MD, Suzy Bouchard, RN, Priscella Mann, Technician Referring MD:              Medicines:                Monitored Anesthesia Care Complications:            No immediate complications. Estimated blood loss:                            Minimal. Estimated Blood Loss:     Estimated blood loss was minimal. Procedure:                Pre-Anesthesia Assessment:                           - Prior to the procedure, a History and Physical                            was performed, and patient medications and                            allergies were reviewed. The patient's tolerance of                            previous anesthesia was also reviewed. The risks                            and benefits of the procedure and the sedation                            options and risks were discussed with the patient.                            All questions were answered, and informed consent                            was obtained. Prior Anticoagulants: The patient has                            taken no anticoagulant or antiplatelet agents. ASA  Grade Assessment: IV - A patient with severe                            systemic disease that is a constant threat to life.                            After reviewing the risks and benefits, the patient                            was deemed in satisfactory condition to undergo the                            procedure.                           After obtaining  informed consent, the endoscope was                            passed under direct vision. Throughout the                            procedure, the patient's blood pressure, pulse, and                            oxygen saturations were monitored continuously. The                            [id number]-H190 ([id number] Olympus endoscope was introduced                            through the mouth, and advanced to the second part                            of duodenum. The upper GI endoscopy was                            accomplished without difficulty. The patient                            tolerated the procedure well. Scope In: Scope Out: Findings:      Esophagogastric landmarks were identified: the Z-line was found at 38       cm, the gastroesophageal junction was found at 38 cm and the upper       extent of the gastric folds was found at 38 cm from the incisors.      Patchy, white plaques were found in the middle third of the esophagus       and in the lower third of the esophagus.      The exam of the esophagus was otherwise normal.      Patchy mildly erythematous mucosa was found in the gastric antrum.      The exam of the stomach was otherwise normal.      Biopsies were taken with a cold forceps for Helicobacter pylori testing.      The examined duodenum was normal. Impression:               -  Esophagogastric landmarks identified.                           - Esophageal plaques were found, suspicious for                            candidiasis.                           - Normal esophagus otherwise.                           - Erythematous mucosa in the antrum.                           - Normal stomach otherwise - biopsies taken to rule                            out H Pylori                           - Normal examined duodenum.                           No overt cause for IDA on this exam Recommendation:           - Return patient to hospital ward for ongoing care.                            - Advance diet as tolerated.                           - Continue present medications.                           - Start fluconazole for esophageal candidiasis -                            400mg  x 1 dose then 200mg  / day for another 13 days                            - if no med interactions                           - Await pathology results.                           - Proceed with colonoscopy Procedure Code(s):        --- Professional ---                           [phone], Esophagogastroduodenoscopy, flexible,                            transoral; with biopsy, single or multiple Diagnosis Code(s):        --- Professional ---  K22.9, Disease of esophagus, unspecified                           K31.89, Other diseases of stomach and duodenum                           D50.9, Iron deficiency anemia, unspecified CPT copyright 2022 [employer]. All rights reserved. The codes documented in this report are preliminary and upon coder review may  be revised to meet current compliance requirements. [name], MD 06/28/2023 9:34:07 AM This report has been signed electronically. Number of Addenda: 0

## 2023-06-28 NOTE — Transfer of Care (Signed)
Immediate Anesthesia Transfer of Care Note  Patient: Victor [name]  Procedure(s) Performed: ESOPHAGOGASTRODUODENOSCOPY (EGD) WITH PROPOFOL COLONOSCOPY WITH PROPOFOL BIOPSY SUBMUCOSAL LIFTING INJECTION SUBMUCOSAL TATTOO INJECTION  Patient Location: PACU  Anesthesia Type:MAC  Level of Consciousness: awake, alert , oriented, and patient cooperative  Airway & Oxygen Therapy: Patient Spontanous Breathing and Patient connected to face mask oxygen  Post-op Assessment: Report given to RN and Post -op Vital signs reviewed and stable  Post vital signs: Reviewed and stable  Last Vitals:  Vitals Value Taken Time  BP 103/88 06/28/23 0940  Temp    Pulse    Resp 23 06/28/23 0942  SpO2    Vitals shown include unfiled device data.  Last Pain:  Vitals:   06/28/23 0734  TempSrc: Tympanic  PainSc: 0-No pain      Patients Stated Pain Goal: 0 (06/27/23 0226)  Complications: No notable events documented.

## 2023-06-28 NOTE — Op Note (Signed)
[facility] Patient Name: Victor [name] Procedure Date : 06/28/2023 MRN: [id number] Attending MD: Willaim Rayas. Adela Lank , MD, [id number] Date of Birth: [birth year] CSN: [id number] Age: 52 Admit Type: Inpatient Procedure:                Colonoscopy Indications:              Iron deficiency anemia - first colonoscopy -                            pending MVR next week Providers:                Willaim Rayas. Adela Lank, MD, Suzy Bouchard, RN, Priscella Mann, Technician Referring MD:              Medicines:                Monitored Anesthesia Care Complications:            No immediate complications. Estimated blood loss:                            Minimal. Estimated Blood Loss:     Estimated blood loss was minimal. Procedure:                Pre-Anesthesia Assessment:                           - Prior to the procedure, a History and Physical                            was performed, and patient medications and                            allergies were reviewed. The patient's tolerance of                            previous anesthesia was also reviewed. The risks                            and benefits of the procedure and the sedation                            options and risks were discussed with the patient.                            All questions were answered, and informed consent                            was obtained. Prior Anticoagulants: The patient has                            taken no anticoagulant or antiplatelet agents. ASA  Grade Assessment: IV - A patient with severe                            systemic disease that is a constant threat to life.                            After reviewing the risks and benefits, the patient                            was deemed in satisfactory condition to undergo the                            procedure.                           After obtaining informed consent, the colonoscope                             was passed under direct vision. Throughout the                            procedure, the patient's blood pressure, pulse, and                            oxygen saturations were monitored continuously. The                            CF-HQ190L ([id number] Olympus coloscope was                            introduced through the anus and advanced to the the                            terminal ileum, with identification of the                            appendiceal orifice and IC valve. The colonoscopy                            was performed without difficulty. The patient                            tolerated the procedure well. The quality of the                            bowel preparation was adequate. The terminal ileum,                            ileocecal valve, appendiceal orifice, and rectum                            were photographed. Scope In: 8:49:51 AM Scope Out: 9:32:36 AM Scope Withdrawal Time: 0 hours 37 minutes 44 seconds  Total Procedure Duration: 0 hours 42 minutes  45 seconds  Findings:      The perianal and digital rectal examinations were normal.      The terminal ileum appeared normal.      Two sessile polyps were found in the cecum. The polyps were 3 mm in       size. These polyps were removed with a cold snare. Resection and       retrieval were complete.      A roughly 30-35 mm polyp was found in the hepatic flexure. The polyp was       sessile and wrapped around a fold - technically challenging to visualize       / see en bloc. There was a small area of ulceration in the midle of the       polyp. The polyp was removed with a piecemeal technique using a cold       snare. Saline injection was used to lift it and aid with resection, it       lifted well without problems - no depression. Resection and retrieval       were complete. Area distal to and across the lumen from the lesion was       tattooed with an injection of Spot (carbon black).       Multiple small-mouthed diverticula were found in the sigmoid colon.      Internal hemorrhoids were found during retroflexion. The hemorrhoids       were small.      The exam was otherwise without abnormality. Of note, retained vegetable       matter was noted in part of the cecum which prohibited views in a small       area of the cecum. Impression:               - The examined portion of the ileum was normal.                           - Two 3 mm polyps in the cecum, removed with a cold                            snare. Resected and retrieved.                           - One roughly 35 mm polyp at the hepatic flexure,                            removed piecemeal using a cold snare. Resected and                            retrieved. Injected. Tattooed.                           - Diverticulosis in the sigmoid colon.                           - Internal hemorrhoids.                           - Retained vegetable matter in portion of the cecum  which limited views there.                           - The examination was otherwise normal.                           Suspect large polyp with ulceration may be cause of                            iron deficiency anemia. Resected via cold EMR. Recommendation:           - Return patient to hospital ward for ongoing care.                           - Advance diet as tolerated.                           - Continue present medications.                           - Await pathology results.                           - Anticipate repeat colonoscopy in 6 months to                            assess for residual polyp / recurrence given size                            of this lesion, manner it was removed, difficult                            polypectomy.                           - Monitor for any bleeding post procedure - given                            manner in which this was resected, hopefully risk                            for  post polypectomy bleeding is low                           - GI service will sign off for now, patient can                            proceed with MVR on Tuesday. Call with questions Procedure Code(s):        --- Professional ---                           [phone], Colonoscopy, flexible; with removal of                            tumor(s), polyp(s), or other lesion(s) by  snare                            technique                           [id number], Colonoscopy, flexible; with directed                            submucosal injection(s), any substance Diagnosis Code(s):        --- Professional ---                           D12.0, Benign neoplasm of cecum                           D12.3, Benign neoplasm of transverse colon (hepatic                            flexure or splenic flexure)                           K64.8, Other hemorrhoids                           D50.9, Iron deficiency anemia, unspecified                           K57.30, Diverticulosis of large intestine without                            perforation or abscess without bleeding CPT copyright 2022 American [employer] Association. All rights reserved. The codes documented in this report are preliminary and upon coder review may  be revised to meet current compliance requirements. Viviann Spare P. Ayanah Snader, MD 06/28/2023 9:44:41 AM This report has been signed electronically. Number of Addenda: 0

## 2023-06-28 NOTE — Plan of Care (Signed)

## 2023-06-28 NOTE — Interval H&P Note (Signed)
History and Physical Interval Note: Patient did well with prep overnight, clear stools. He is here for EGD and colonoscopy today. I have reviewed the procedures with him, risks of this and anesthesia, and he wants to proceed. Further recommendations pending the results. NO changes since I have last seen him, he feels well this AM without complaints.  06/28/2023 7:29 AM  [name]  has presented today for surgery, with the diagnosis of Iron deficiency anemia.  The various methods of treatment have been discussed with the patient and family. After consideration of risks, benefits and other options for treatment, the patient has consented to  Procedure(s): ESOPHAGOGASTRODUODENOSCOPY (EGD) WITH PROPOFOL (N/A) COLONOSCOPY WITH PROPOFOL (N/A) as a surgical intervention.  The patient's history has been reviewed, patient examined, no change in status, stable for surgery.  I have reviewed the patient's chart and labs.  Questions were answered to the patient's satisfaction.     Viviann Spare P Aniya Jolicoeur

## 2023-06-28 NOTE — Anesthesia Postprocedure Evaluation (Signed)
Anesthesia Post Note  Patient: Victor [name]  Procedure(s) Performed: ESOPHAGOGASTRODUODENOSCOPY (EGD) WITH PROPOFOL COLONOSCOPY WITH PROPOFOL BIOPSY SUBMUCOSAL LIFTING INJECTION SUBMUCOSAL TATTOO INJECTION POLYPECTOMY     Patient location during evaluation: PACU Anesthesia Type: MAC Level of consciousness: awake Pain management: pain level controlled Vital Signs Assessment: post-procedure vital signs reviewed and stable Respiratory status: spontaneous breathing, nonlabored ventilation and respiratory function stable Cardiovascular status: stable and blood pressure returned to baseline Postop Assessment: no apparent nausea or vomiting Anesthetic complications: no   No notable events documented.  Last Vitals:  Vitals:   06/28/23 1030 06/28/23 1045  BP: 121/71 127/70  Pulse:    Resp: (!) 22 (!) 24  Temp:    SpO2: 98% 100%    Last Pain:  Vitals:   06/28/23 1020  TempSrc: Oral  PainSc:                  Linton Rump

## 2023-06-28 NOTE — Progress Notes (Addendum)
PROGRESS NOTE    Victor [name]  WNU:[id number] DOB: 1971/02/14 DOA: 06/17/2023 PCP: Claiborne Rigg, NP   Brief Narrative:  52 years old male with past medical history of hyperlipidemia, obesity, hypertension presented to hospital with exertional shortness of breath, bilateral lower extremity edema and pain for the last 2 weeks.  Patient also complains of shortness of breath at rest and has been having difficulty lying down in bed. Further workup was done and he was found to have a a Strepotcoccal Bacteremmia and Mitral Valve Endocarditis. 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  Cardiac MRI suspicious for hypertrophic cardiomyopathy. Cardiac catheterization done and he had minimal nonobstructive CAD with elevated V waves and filling pressures as well as a subaortic gradient with increased PVCs suggesting potential hyperacute trophic cardiomyopathy.  Cardiothoracic surgery on board, plan for surgery next week Tuesday.   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 Lasix Continue metoprolol Currently he is posted fo his for surgical intervention with Dr. Villa Herb on Tuesday 12/10 (MVR/subaortic myectomy)   History of Hypertension Continue metoprolol   Streptococcal Salivarius Bacteremia and severe mitral valve regurgitation with mitral valve endocarditis Blood Cx x2 done and showed GPC in Chains and was showing Streptococcal Species in 4/4 Cx's with Sensitivities pending; Repeat Blood Cx 06/20/23 showing NGTD at 5 Days ID on board, continue IV  penicillin  HLD Hold statins for now due to abnormal LFTs   Liver cirrhosis, likely compensated Elevated liver enzymes RUQ U/S with noted liver cirrhosis  Acute Hepatitis Panel nonreactive GI consulted, appreciate recs   Hyponatremia Uncertain etiology at this time but likely in the setting of volume overload   Daily CMP   Alcohol Use Disorder Drinks 16 ounce beer probably every day.  Denies any withdrawal symptoms.   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 ID on board, MRI Lumbar Spine with Contrast and showed "Reactive endplate change with endplate enhancement about the L5-S1 interspace, favored to be degenerative in nature, although possible changes of infection are difficult to exclude, and could be considered in the correct clinical setting Neurosurgery team is evaluated the imaging and feels that no neurosurgical intervention is needed and they are just recommending continuing antibiotics  ID recommends continuing treatment with IV antibiotics as above  Normocytic Anemia with Acute Drop on 12/3 Likely from large ulcerated polyp Candida esophagitis Hemoglobin dropped to 6.6, s/p 1 unit of PRBC FOBT negative Hold ASA for now as well as NSAIDs Anemia panel was done and showed an iron level of 18, UIBC of 237, TIBC 255, saturation ratios of 7%, ferritin of 248, folate level 9.8, vitamin B12 level 540 Continue to Monitor for S/Sx of Bleeding; no overt bleeding noted GI consulted, s/p EGD which showed possible Candida esophagitis, recommend fluconazole, and colonoscopy which showed large ulcerated polyp, with 2 to 3 mm polyps, removed using a cold snare.  Pathology pending.  Repeat colonoscopy in 6  months Start fluconazole, continue pantoprazole    Obesity -Complicates overall prognosis and care -Estimated body mass index is 29.94 kg/m as calculated from the following:   Height as of this encounter: 5\' 7"  (1.702 m).   Weight as of this encounter: 86.7 kg.  -Weight Loss and Dietary Counseling given     DVT prophylaxis: enoxaparin (LOVENOX) injection 40 mg Start: 06/25/23 0800 SCDs Start: 06/18/23 0858    Code Status: Full Code Family Communication: None at bedside  Disposition Plan:  Level of care: Telemetry Cardiac Status is: Inpatient Remains inpatient appropriate because: Needs further clinical improvement and clearance by specialist as he is undergoing surgical intervention next week   Consultants:  Cardiology Infectious Diseases Cardiothoracic Surgery Discussed with Neurosurgery  Procedures:    RIGHT AND LEFT CARDIAC CATHETERIZATION AND CORONARY ANGIOGRAPHY    Antimicrobials:  Anti-infectives (From admission, onward)    Start     Dose/Rate Route Frequency Ordered Stop   06/29/23 1000  fluconazole (DIFLUCAN) tablet 200 mg       Placed in "Followed by" Linked Group   200 mg Oral Daily 06/28/23 1121 07/12/23 0959   06/28/23 1215  fluconazole (DIFLUCAN) tablet 400 mg       Placed in "Followed by" Linked Group   400 mg Oral  Once 06/28/23 1121 06/28/23 1144   06/24/23 2200  penicillin G potassium 12 Million Units in dextrose 5 % 500 mL CONTINUOUS infusion        12 Million Units 41.7 mL/hr over 12 Hours Intravenous Every 12 hours 06/24/23 1522     06/21/23 1115  cefTRIAXone (ROCEPHIN) 2 g in sodium chloride 0.9 % 100 mL IVPB  Status:  Discontinued        2 g 200 mL/hr over 30 Minutes Intravenous Every 24 hours 06/21/23 1017 06/24/23 1522   06/19/23 1600  vancomycin (VANCOREADY) IVPB 1250 mg/250 mL  Status:  Discontinued        1,250 mg 166.7 mL/hr over 90 Minutes Intravenous Every 12 hours 06/19/23 0323 06/21/23 1017   06/19/23 1200  ceFEPIme (MAXIPIME) 2 g in sodium chloride 0.9 % 100 mL IVPB  Status:   Discontinued        2 g 200 mL/hr over 30 Minutes Intravenous Every 8 hours 06/19/23 0323 06/20/23 1156   06/19/23 0145  vancomycin (VANCOREADY) IVPB 1750 mg/350 mL        1,750 mg 175 mL/hr over 120 Minutes Intravenous  Once 06/19/23 0051 06/19/23 1044   06/19/23 0145  ceFEPIme (MAXIPIME) 2 g in sodium chloride 0.9 % 100 mL IVPB        2 g 200 mL/hr over 30 Minutes Intravenous  Once 06/19/23 0051 06/19/23 0908   06/19/23 0045  metroNIDAZOLE (FLAGYL) IVPB 500 mg  Status:  Discontinued        500 mg 100 mL/hr over 60 Minutes Intravenous Every 12 hours 06/19/23 0041 06/20/23 1156       Subjective: Saw patient after EGD/colonoscopy.  Denied any new complaints.  Objective: Vitals:   06/28/23 1020 06/28/23 1030 06/28/23 1045 06/28/23 1532  BP: (!) 141/68 121/71 127/70 (!) 118/57  Pulse: 83   91  Resp: 20 (!) 22 (!) 24   Temp: (!) 97.4 F (36.3 C)   (!) 97.5 F (36.4 C)  TempSrc: Oral   Oral  SpO2: 96% 98% 100%   Weight:      Height:        Intake/Output Summary (Last 24 hours) at 06/28/2023 1902 Last  data filed at 06/28/2023 1806 Gross per 24 hour  Intake 2402.62 ml  Output 200 ml  Net 2202.62 ml   Filed Weights   06/26/23 0456 06/27/23 0332 06/28/23 0650  Weight: 87.4 kg 87.3 kg 86.1 kg   Examination: Physical Exam: General: NAD  Cardiovascular: S1, S2 present Respiratory: CTAB Abdomen: Soft, nontender, nondistended, bowel sounds present Musculoskeletal: No bilateral pedal edema noted Skin: Normal Psychiatry: Normal mood    Data Reviewed: I have personally reviewed following labs and imaging studies  CBC: Recent Labs  Lab 06/24/23 0301 06/24/23 0948 06/25/23 0232 06/25/23 1858 06/26/23 0226 06/27/23 0218 06/28/23 0227  WBC 8.4  --  9.4  --  9.1 10.0 9.8  NEUTROABS 5.7  --  7.2  --  6.4 6.7 6.9  HGB 7.3*   < > 6.6* 8.7* 8.2* 8.6* 8.3*  HCT 24.0*   < > 21.5* 27.9* 26.7* 27.0* 26.9*  MCV 94.9  --  94.3  --  93.0 91.8 92.4  PLT 221  --  221  --  238 249  242   < > = values in this interval not displayed.   Basic Metabolic Panel: Recent Labs  Lab 06/22/23 0210 06/23/23 0230 06/24/23 0301 06/24/23 0948 06/25/23 0232 06/25/23 1858 06/26/23 0226 06/27/23 0218 06/28/23 0227  NA 131* 132* 132*   < > 130* 136 133* 131* 132*  K 4.3 4.5 4.1   < > 5.3* 4.2 4.2 4.2 4.1  CL 101 101 103  --  102 105 103 96* 99  CO2 24 21* 21*  --  20* 21* 23 24 23   GLUCOSE 120* 121* 123*  --  235* 110* 122* 114* 117*  BUN 15 15 15   --  16 15 13 16 11   CREATININE 0.74 0.79 0.63  --  0.83 0.81 0.75 0.86 0.68  CALCIUM 8.7* 8.9 8.7*  --  8.5* 8.9 8.6* 8.9 9.3  MG 2.2 2.0 2.0  --  2.0  --  1.9  --   --   PHOS 4.5 3.8 4.0  --  3.7  --  4.1  --   --    < > = values in this interval not displayed.   GFR: Estimated Creatinine Clearance: 110.6 mL/min (by C-G formula based on SCr of 0.68 mg/dL). Liver Function Tests: Recent Labs  Lab 06/25/23 0232 06/25/23 1858 06/26/23 0226 06/27/23 0218 06/28/23 0227  AST 94* 139* 129* 98* 67*  ALT 75* 111* 113* 109* 91*  ALKPHOS 64 72 68 67 77  BILITOT 0.4 0.6 0.4 0.6 0.6  PROT 6.9 7.8 7.2 7.1 7.2  ALBUMIN 2.4* 2.7* 2.4* 2.5* 2.6*   No results for input(s): "LIPASE", "AMYLASE" in the last 168 hours. No results for input(s): "AMMONIA" in the last 168 hours. Coagulation Profile: Recent Labs  Lab 06/27/23 1604  INR 1.2   Cardiac Enzymes: No results for input(s): "CKTOTAL", "CKMB", "CKMBINDEX", "TROPONINI" in the last 168 hours. BNP (last 3 results) No results for input(s): "PROBNP" in the last 8760 hours. HbA1C: No results for input(s): "HGBA1C" in the last 72 hours. CBG: No results for input(s): "GLUCAP" in the last 168 hours. Lipid Profile: No results for input(s): "CHOL", "HDL", "LDLCALC", "TRIG", "CHOLHDL", "LDLDIRECT" in the last 72 hours. Thyroid Function Tests: No results for input(s): "TSH", "T4TOTAL", "FREET4", "T3FREE", "THYROIDAB" in the last 72 hours. Anemia Panel: No results for input(s):  "VITAMINB12", "FOLATE", "FERRITIN", "TIBC", "IRON", "RETICCTPCT" in the last 72 hours. Sepsis Labs: No results for input(s): "PROCALCITON", "LATICACIDVEN"  in the last 168 hours.  Recent Results (from the past 240 hour(s))  Culture, blood (Routine X 2) w Reflex to ID Panel     Status: Abnormal   Collection Time: 06/19/23  2:39 AM   Specimen: BLOOD  Result Value Ref Range Status   Specimen Description BLOOD BLOOD LEFT ARM  Final   Special Requests   Final    BOTTLES DRAWN AEROBIC AND ANAEROBIC Blood Culture adequate volume   Culture  Setup Time   Final    GRAM POSITIVE COCCI IN CHAINS IN BOTH AEROBIC AND ANAEROBIC BOTTLES CRITICAL RESULT CALLED TO, READ BACK BY AND VERIFIED WITH: PHARMD JAMES LEDFORD ON 06/19/23 @ 2313 BY DRT Performed at [facility], [address] [place], [place] [address]    Culture STREPTOCOCCUS SALIVARIUS (A)  Final   Report Status 06/22/2023 FINAL  Final   Organism ID, Bacteria STREPTOCOCCUS SALIVARIUS  Final      Susceptibility   Streptococcus salivarius - MIC*    PENICILLIN <=0.06 SENSITIVE Sensitive     CEFTRIAXONE <=0.12 SENSITIVE Sensitive     ERYTHROMYCIN <=0.12 SENSITIVE Sensitive     LEVOFLOXACIN 2 SENSITIVE Sensitive     VANCOMYCIN 0.5 SENSITIVE Sensitive     * STREPTOCOCCUS SALIVARIUS  Culture, blood (Routine X 2) w Reflex to ID Panel     Status: Abnormal   Collection Time: 06/19/23  2:41 AM   Specimen: BLOOD  Result Value Ref Range Status   Specimen Description BLOOD BLOOD RIGHT ARM  Final   Special Requests   Final    BOTTLES DRAWN AEROBIC AND ANAEROBIC Blood Culture adequate volume   Culture  Setup Time   Final    GRAM POSITIVE COCCI IN CHAINS IN BOTH AEROBIC AND ANAEROBIC BOTTLES CRITICAL RESULT CALLED TO, READ BACK BY AND VERIFIED WITH: PHARMD JAMES LEDFORD ON 06/19/23 @ 2313 BY DRT    Culture (A)  Final    STREPTOCOCCUS SALIVARIUS SUSCEPTIBILITIES PERFORMED ON PREVIOUS CULTURE WITHIN THE LAST 5 DAYS.    Report Status  06/22/2023 FINAL  Final  Blood Culture ID Panel (Reflexed)     Status: Abnormal   Collection Time: 06/19/23  2:41 AM  Result Value Ref Range Status   Enterococcus faecalis NOT DETECTED NOT DETECTED Final   Enterococcus Faecium NOT DETECTED NOT DETECTED Final   Listeria monocytogenes NOT DETECTED NOT DETECTED Final   Staphylococcus species NOT DETECTED NOT DETECTED Final   Staphylococcus aureus (BCID) NOT DETECTED NOT DETECTED Final   Staphylococcus epidermidis NOT DETECTED NOT DETECTED Final   Staphylococcus lugdunensis NOT DETECTED NOT DETECTED Final   Streptococcus species DETECTED (A) NOT DETECTED Final    Comment: Not Enterococcus species, Streptococcus agalactiae, Streptococcus pyogenes, or Streptococcus pneumoniae. CRITICAL RESULT CALLED TO, READ BACK BY AND VERIFIED WITH: PHARMD JAMES LEDFORD ON 06/19/23 @ 2313 BY DRT    Streptococcus agalactiae NOT DETECTED NOT DETECTED Final   Streptococcus pneumoniae NOT DETECTED NOT DETECTED Final   Streptococcus pyogenes NOT DETECTED NOT DETECTED Final   A.calcoaceticus-baumannii NOT DETECTED NOT DETECTED Final   Bacteroides fragilis NOT DETECTED NOT DETECTED Final   Enterobacterales NOT DETECTED NOT DETECTED Final   Enterobacter cloacae complex NOT DETECTED NOT DETECTED Final   Escherichia coli NOT DETECTED NOT DETECTED Final   Klebsiella aerogenes NOT DETECTED NOT DETECTED Final   Klebsiella oxytoca NOT DETECTED NOT DETECTED Final   Klebsiella pneumoniae NOT DETECTED NOT DETECTED Final   Proteus species NOT DETECTED NOT DETECTED Final   Salmonella species NOT DETECTED  NOT DETECTED Final   Serratia marcescens NOT DETECTED NOT DETECTED Final   Haemophilus influenzae NOT DETECTED NOT DETECTED Final   Neisseria meningitidis NOT DETECTED NOT DETECTED Final   Pseudomonas aeruginosa NOT DETECTED NOT DETECTED Final   Stenotrophomonas maltophilia NOT DETECTED NOT DETECTED Final   Candida albicans NOT DETECTED NOT DETECTED Final   Candida auris  NOT DETECTED NOT DETECTED Final   Candida glabrata NOT DETECTED NOT DETECTED Final   Candida krusei NOT DETECTED NOT DETECTED Final   Candida parapsilosis NOT DETECTED NOT DETECTED Final   Candida tropicalis NOT DETECTED NOT DETECTED Final   Cryptococcus neoformans/gattii NOT DETECTED NOT DETECTED Final    Comment: Performed at [facility], [address], [place], [place] [address]  SARS Coronavirus 2 by RT PCR (hospital order, performed in [facility] hospital lab) *cepheid single result test* Anterior Nasal Swab     Status: None   Collection Time: 06/19/23  2:45 AM   Specimen: Anterior Nasal Swab  Result Value Ref Range Status   SARS Coronavirus 2 by RT PCR NEGATIVE NEGATIVE Final    Comment: Performed at [facility], [address] [place], [place] [address]  Respiratory (~20 pathogens) panel by PCR     Status: None   Collection Time: 06/19/23  2:45 AM   Specimen: Nasopharyngeal Swab; Respiratory  Result Value Ref Range Status   Adenovirus NOT DETECTED NOT DETECTED Final   Coronavirus 229E NOT DETECTED NOT DETECTED Final    Comment: (NOTE) The Coronavirus on the Respiratory Panel, DOES NOT test for the novel  Coronavirus (2019 nCoV)    Coronavirus HKU1 NOT DETECTED NOT DETECTED Final   Coronavirus NL63 NOT DETECTED NOT DETECTED Final   Coronavirus OC43 NOT DETECTED NOT DETECTED Final   Metapneumovirus NOT DETECTED NOT DETECTED Final   Rhinovirus / Enterovirus NOT DETECTED NOT DETECTED Final   Influenza A NOT DETECTED NOT DETECTED Final   Influenza B NOT DETECTED NOT DETECTED Final   Parainfluenza Virus 1 NOT DETECTED NOT DETECTED Final   Parainfluenza Virus 2 NOT DETECTED NOT DETECTED Final   Parainfluenza Virus 3 NOT DETECTED NOT DETECTED Final   Parainfluenza Virus 4 NOT DETECTED NOT DETECTED Final   Respiratory Syncytial Virus NOT DETECTED NOT DETECTED Final   Bordetella pertussis NOT DETECTED NOT DETECTED Final   Bordetella Parapertussis NOT DETECTED NOT  DETECTED Final   Chlamydophila pneumoniae NOT DETECTED NOT DETECTED Final   Mycoplasma pneumoniae NOT DETECTED NOT DETECTED Final    Comment: Performed at [facility], [address] [place], [place] [address]  Culture, blood (Routine X 2) w Reflex to ID Panel     Status: None   Collection Time: 06/20/23  9:40 AM   Specimen: BLOOD LEFT ARM  Result Value Ref Range Status   Specimen Description BLOOD LEFT ARM  Final   Special Requests   Final    BOTTLES DRAWN AEROBIC AND ANAEROBIC Blood Culture adequate volume   Culture   Final    NO GROWTH 5 DAYS Performed at [facility], [address], [place], [place] [address]    Report Status 06/25/2023 FINAL  Final  Culture, blood (Routine X 2) w Reflex to ID Panel     Status: None   Collection Time: 06/20/23  9:49 AM   Specimen: BLOOD RIGHT HAND  Result Value Ref Range Status   Specimen Description BLOOD RIGHT HAND  Final   Special Requests   Final  BOTTLES DRAWN AEROBIC AND ANAEROBIC Blood Culture adequate volume   Culture   Final    NO GROWTH 5 DAYS Performed at [facility], [address] [place], [place] [address]    Report Status 06/25/2023 FINAL  Final    Radiology Studies: US Abdomen Limited RUQ (LIVER/GB)  Result Date: 06/27/2023 CLINICAL DATA:  Elevated LFTs. EXAM: ULTRASOUND ABDOMEN LIMITED RIGHT UPPER QUADRANT COMPARISON:  None Available. FINDINGS: Gallbladder: No gallstones or wall thickening visualized. No sonographic Murphy sign noted by sonographer. Common bile duct: Diameter: 3-4 mm Liver: Heterogeneous coarsened hepatic echotexture with nodular contour. Portal vein is patent on color Doppler imaging with normal direction of blood flow towards the liver. Other: None. IMPRESSION: 1. Heterogeneous coarsened hepatic echotexture with nodular contour. Imaging features suggest cirrhosis. 2. No evidence for cholelithiasis or biliary ductal dilatation. Electronically Signed   By: Kennith Center M.D.   On:  06/27/2023 06:15    Scheduled Meds:  docusate sodium  100 mg Oral BID   enoxaparin (LOVENOX) injection  40 mg Subcutaneous Q24H   [START ON 06/29/2023] fluconazole  200 mg Oral Daily   folic acid  1 mg Oral Daily   furosemide  40 mg Oral BID   gabapentin  300 mg Oral TID   methocarbamol  500 mg Oral TID   metoprolol tartrate  50 mg Oral BID   multivitamin with minerals  1 tablet Oral Daily   pantoprazole  40 mg Oral Daily   sodium chloride flush  3 mL Intravenous Q12H   thiamine  100 mg Oral Daily   Continuous Infusions:  penicillin G potassium 12 Million Units in dextrose 5 % 500 mL CONTINUOUS infusion 12 Million Units (06/28/23 1142)    LOS: 10 days   Briant Cedar, MD [facility] Available via Epic secure chat 7am-7pm After these hours, please refer to coverage provider listed on [email or web address] 06/28/2023, 7:02 PM

## 2023-06-29 DIAGNOSIS — R0602 Shortness of breath: Secondary | ICD-10-CM | POA: Diagnosis not present

## 2023-06-29 DIAGNOSIS — I340 Nonrheumatic mitral (valve) insufficiency: Secondary | ICD-10-CM | POA: Diagnosis not present

## 2023-06-29 DIAGNOSIS — I339 Acute and subacute endocarditis, unspecified: Secondary | ICD-10-CM | POA: Diagnosis not present

## 2023-06-29 LAB — CBC WITH DIFFERENTIAL/PLATELET
Abs Immature Granulocytes: 0.04 10*3/uL (ref 0.00–0.07)
Basophils Absolute: 0 10*3/uL (ref 0.0–0.1)
Basophils Relative: 0 %
Eosinophils Absolute: 0.2 10*3/uL (ref 0.0–0.5)
Eosinophils Relative: 2 %
HCT: 26.1 % — ABNORMAL LOW (ref 39.0–52.0)
Hemoglobin: 8 g/dL — ABNORMAL LOW (ref 13.0–17.0)
Immature Granulocytes: 0 %
Lymphocytes Relative: 22 %
Lymphs Abs: 2.1 10*3/uL (ref 0.7–4.0)
MCH: 28.6 pg (ref 26.0–34.0)
MCHC: 30.7 g/dL (ref 30.0–36.0)
MCV: 93.2 fL (ref 80.0–100.0)
Monocytes Absolute: 0.6 10*3/uL (ref 0.1–1.0)
Monocytes Relative: 6 %
Neutro Abs: 6.6 10*3/uL (ref 1.7–7.7)
Neutrophils Relative %: 70 %
Platelets: 234 10*3/uL (ref 150–400)
RBC: 2.8 MIL/uL — ABNORMAL LOW (ref 4.22–5.81)
RDW: 16.1 % — ABNORMAL HIGH (ref 11.5–15.5)
WBC: 9.6 10*3/uL (ref 4.0–10.5)
nRBC: 0 % (ref 0.0–0.2)

## 2023-06-29 LAB — COMPREHENSIVE METABOLIC PANEL
ALT: 63 U/L — ABNORMAL HIGH (ref 0–44)
AST: 34 U/L (ref 15–41)
Albumin: 2.4 g/dL — ABNORMAL LOW (ref 3.5–5.0)
Alkaline Phosphatase: 69 U/L (ref 38–126)
Anion gap: 10 (ref 5–15)
BUN: 12 mg/dL (ref 6–20)
CO2: 21 mmol/L — ABNORMAL LOW (ref 22–32)
Calcium: 9 mg/dL (ref 8.9–10.3)
Chloride: 102 mmol/L (ref 98–111)
Creatinine, Ser: 0.64 mg/dL (ref 0.61–1.24)
GFR, Estimated: >60 mL/min (ref >60)
Glucose, Bld: 128 mg/dL — ABNORMAL HIGH (ref 70–99)
Potassium: 4 mmol/L (ref 3.5–5.1)
Sodium: 133 mmol/L — ABNORMAL LOW (ref 135–145)
Total Bilirubin: 0.4 mg/dL (ref <1.2)
Total Protein: 7.1 g/dL (ref 6.5–8.1)

## 2023-06-29 NOTE — Progress Notes (Signed)
Mobility Specialist Progress Note:   06/29/23 1000  Mobility  Activity Ambulated independently in room  Level of Assistance Modified independent, requires aide device or extra time  Assistive Device None  Distance Ambulated (ft) 40 ft  Activity Response Tolerated well  Mobility Referral Yes  Mobility visit 1 Mobility  Mobility Specialist Start Time (ACUTE ONLY) 1000  Mobility Specialist Stop Time (ACUTE ONLY) 1010  Mobility Specialist Time Calculation (min) (ACUTE ONLY) 10 min   Pt had recently ambulated in hallway, agreed to minimal mobility att this time. No physical assistance required during ambulation in room. Pt asx throughout, left sitting EOB with all needs met.  Addison Lank Mobility Specialist Please contact via SecureChat or  Rehab office at [phone]

## 2023-06-29 NOTE — Plan of Care (Signed)

## 2023-06-29 NOTE — H&P (View-Only) (Signed)
[address]       [place] [address]             [phone]      1 Day Post-Op  Procedure(s) (LRB): ESOPHAGOGASTRODUODENOSCOPY (EGD) WITH PROPOFOL (N/A) COLONOSCOPY WITH PROPOFOL (N/A) BIOPSY SUBMUCOSAL LIFTING INJECTION SUBMUCOSAL TATTOO INJECTION POLYPECTOMY   Total Length of Stay:  LOS: 11 days    SUBJECTIVE: GI work up appreciated  Vitals:   06/29/23 0444 06/29/23 0816  BP: 102/70 135/66  Pulse: 81 95  Resp: 20   Temp: 99.3 F (37.4 C)   SpO2: 98%     Intake/Output      12/06 0701 12/07 0700 12/07 0701 12/08 0700   P.O. 1485    I.V. (mL/kg) 3 (0)    IV Piggyback 1084.2    Total Intake(mL/kg) 2572.2 (29.7)    Urine (mL/kg/hr) 2050 (1)    Blood 0    Total Output 2050    Net +522.2             penicillin G potassium 12 Million Units in dextrose 5 % 500 mL CONTINUOUS infusion 41.7 mL/hr at 06/29/23 0614    CBC    Component Value Date/Time   WBC 9.6 06/29/2023 0217   RBC 2.80 (L) 06/29/2023 0217   HGB 8.0 (L) 06/29/2023 0217   HGB 11.6 (L) 07/27/2020 1417   HCT 26.1 (L) 06/29/2023 0217   HCT 35.8 (L) 07/27/2020 1417   PLT 234 06/29/2023 0217   PLT 177 07/27/2020 1417   MCV 93.2 06/29/2023 0217   MCV 88 07/27/2020 1417   MCH 28.6 06/29/2023 0217   MCHC 30.7 06/29/2023 0217   RDW 16.1 (H) 06/29/2023 0217   RDW 15.2 07/27/2020 1417   LYMPHSABS 2.1 06/29/2023 0217   MONOABS 0.6 06/29/2023 0217   EOSABS 0.2 06/29/2023 0217   BASOSABS 0.0 06/29/2023 0217   CMP     Component Value Date/Time   NA 133 (L) 06/29/2023 0217   NA 140 07/27/2020 1417   K 4.0 06/29/2023 0217   CL 102 06/29/2023 0217   CO2 21 (L) 06/29/2023 0217   GLUCOSE 128 (H) 06/29/2023 0217   BUN 12 06/29/2023 0217   BUN 11 07/27/2020 1417   CREATININE 0.64 06/29/2023 0217   CREATININE 0.95 07/30/2016 0837   CALCIUM 9.0 06/29/2023 0217   PROT 7.1 06/29/2023 0217   PROT 7.8 07/27/2020 1417   ALBUMIN 2.4 (L) 06/29/2023 0217   ALBUMIN 4.3  07/27/2020 1417   AST 34 06/29/2023 0217   ALT 63 (H) 06/29/2023 0217   ALKPHOS 69 06/29/2023 0217   BILITOT 0.4 06/29/2023 0217   BILITOT 0.3 07/27/2020 1417   GFRNONAA >60 06/29/2023 0217   GFRNONAA >89 12/27/2014 0909   GFRAA 108 07/27/2020 1417   GFRAA >89 12/27/2014 0909   ABG    Component Value Date/Time   PHART 7.446 06/24/2023 0950   PCO2ART 29.7 (L) 06/24/2023 0950   PO2ART 71 (L) 06/24/2023 0950   HCO3 20.5 06/24/2023 0950   TCO2 21 (L) 06/24/2023 0950   ACIDBASEDEF 3.0 (H) 06/24/2023 0950   O2SAT 95 06/24/2023 0950   CBG (last 3)  No results for input(s): "GLUCAP" in the last 72 hours.   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    Eugenio Hoes,  MD 06/29/2023

## 2023-06-29 NOTE — Progress Notes (Signed)
PROGRESS NOTE    Victor [name]  XLK:[id number] DOB: 1970-08-24 DOA: 06/17/2023 PCP: Claiborne Rigg, NP   Brief Narrative:  52 years old male with past medical history of hyperlipidemia, obesity, hypertension presented to hospital with exertional shortness of breath, bilateral lower extremity edema and pain for the last 2 weeks.  Patient also complains of shortness of breath at rest and has been having difficulty lying down in bed. Further workup was done and he was found to have a a Strepotcoccal Bacteremmia and Mitral Valve Endocarditis. 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  Cardiac MRI suspicious for hypertrophic cardiomyopathy. Cardiac catheterization done and he had minimal nonobstructive CAD with elevated V waves and filling pressures as well as a subaortic gradient with increased PVCs suggesting potential hyperacute trophic cardiomyopathy.  Cardiothoracic surgery on board, plan for surgery 12/10.   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 Lasix Continue metoprolol Currently he is posted fo his for surgical intervention with Dr. Villa Herb on Tuesday 12/10 (MVR/subaortic myectomy)   History of Hypertension Continue metoprolol   Streptococcal Salivarius Bacteremia and severe mitral valve regurgitation with mitral valve endocarditis Blood Cx x2 done and showed GPC in Chains and was showing Streptococcal Species in 4/4 Cx's with Sensitivities pending; Repeat Blood Cx 06/20/23 showing NGTD at 5 Days ID on board, continue IV  penicillin  HLD Hold statins for now due to abnormal LFTs   Liver cirrhosis, likely compensated Elevated liver enzymes RUQ U/S with noted liver cirrhosis  Acute Hepatitis Panel nonreactive GI consulted, appreciate recs   Hyponatremia Uncertain etiology at this time but likely in the setting of volume overload   Daily CMP   Alcohol Use Disorder Drinks 16 ounce beer probably every day.  Denies any withdrawal symptoms.   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 ID on board, MRI Lumbar Spine with Contrast and showed "Reactive endplate change with endplate enhancement about the L5-S1 interspace, favored to be degenerative in nature, although possible changes of infection are difficult to exclude, and could be considered in the correct clinical setting Neurosurgery team is evaluated the imaging and feels that no neurosurgical intervention is needed and they are just recommending continuing antibiotics  ID recommends continuing treatment with IV antibiotics as above  Normocytic Anemia with Acute Drop on 12/3 Likely from large ulcerated polyp Candida esophagitis Hemoglobin dropped to 6.6, s/p 1 unit of PRBC FOBT negative Hold ASA for now as well as NSAIDs Anemia panel was done and showed an iron level of 18, UIBC of 237, TIBC 255, saturation ratios of 7%, ferritin of 248, folate level 9.8, vitamin B12 level 540 Continue to Monitor for S/Sx of Bleeding; no overt bleeding noted GI consulted, s/p EGD which showed possible Candida esophagitis, recommend fluconazole, and colonoscopy which showed large ulcerated polyp, with 2 to 3 mm polyps, removed using a cold snare.  Pathology pending.  Repeat colonoscopy in 6 months Start  fluconazole, continue pantoprazole    Obesity -Complicates overall prognosis and care -Estimated body mass index is 29.94 kg/m as calculated from the following:   Height as of this encounter: 5\' 7"  (1.702 m).   Weight as of this encounter: 86.7 kg.  -Weight Loss and Dietary Counseling given     DVT prophylaxis: enoxaparin (LOVENOX) injection 40 mg Start: 06/25/23 0800 SCDs Start: 06/18/23 0858    Code Status: Full Code Family Communication: None at bedside  Disposition Plan:  Level of care: Telemetry Cardiac Status is: Inpatient Remains inpatient appropriate because: Needs further clinical improvement and clearance by specialist as he is undergoing surgical intervention next week   Consultants:  Cardiology Infectious Diseases Cardiothoracic Surgery Discussed with Neurosurgery  Procedures:    RIGHT AND LEFT CARDIAC CATHETERIZATION AND CORONARY ANGIOGRAPHY      Subjective: No SOB, no CP  Objective: Vitals:   06/28/23 2218 06/29/23 0001 06/29/23 0444 06/29/23 0816  BP: (!) 143/61 111/64 102/70 135/66  Pulse: 96 74 81 95  Resp: 17 19 20    Temp:  98.3 F (36.8 C) 99.3 F (37.4 C)   TempSrc:  Oral Oral   SpO2:  100% 98%   Weight:   86.6 kg   Height:        Intake/Output Summary (Last 24 hours) at 06/29/2023 1024 Last data filed at 06/29/2023 4098 Gross per 24 hour  Intake 2572.21 ml  Output 2050 ml  Net 522.21 ml   Filed Weights   06/27/23 0332 06/28/23 0650 06/29/23 0444  Weight: 87.3 kg 86.1 kg 86.6 kg   Examination: Physical Exam:  General: Appearance:     Overweight male in no acute distress  Eyes:      Lungs:      respirations unlabored  Heart:    Normal heart rate.     MS:   All extremities are intact.   Neurologic:   Awake, alert     Data Reviewed: I have personally reviewed following labs and imaging studies  CBC: Recent Labs  Lab 06/25/23 0232 06/25/23 1858 06/26/23 0226 06/27/23 0218 06/28/23 0227 06/29/23 0217  WBC 9.4  --  9.1 10.0 9.8 9.6  NEUTROABS 7.2  --   6.4 6.7 6.9 6.6  HGB 6.6* 8.7* 8.2* 8.6* 8.3* 8.0*  HCT 21.5* 27.9* 26.7* 27.0* 26.9* 26.1*  MCV 94.3  --  93.0 91.8 92.4 93.2  PLT 221  --  238 249 242 234   Basic Metabolic Panel: Recent Labs  Lab 06/23/23 0230 06/24/23 0301 06/24/23 0948 06/25/23 0232 06/25/23 1858 06/26/23 0226 06/27/23 0218 06/28/23 0227 06/29/23 0217  NA 132* 132*   < > 130* 136 133* 131* 132* 133*  K 4.5 4.1   < > 5.3* 4.2 4.2 4.2 4.1 4.0  CL 101 103  --  102 105 103 96* 99 102  CO2 21* 21*  --  20* 21* 23 24 23  21*  GLUCOSE 121* 123*  --  235* 110* 122* 114* 117* 128*  BUN 15 15  --  16 15 13 16 11 12   CREATININE 0.79 0.63  --  0.83 0.81 0.75 0.86 0.68 0.64  CALCIUM 8.9 8.7*  --  8.5* 8.9 8.6* 8.9 9.3 9.0  MG 2.0 2.0  --  2.0  --  1.9  --   --   --   PHOS 3.8 4.0  --  3.7  --  4.1  --   --   --    < > = values  in this interval not displayed.   GFR: Estimated Creatinine Clearance: 110.9 mL/min (by C-G formula based on SCr of 0.64 mg/dL). Liver Function Tests: Recent Labs  Lab 06/25/23 1858 06/26/23 0226 06/27/23 0218 06/28/23 0227 06/29/23 0217  AST 139* 129* 98* 67* 34  ALT 111* 113* 109* 91* 63*  ALKPHOS 72 68 67 77 69  BILITOT 0.6 0.4 0.6 0.6 0.4  PROT 7.8 7.2 7.1 7.2 7.1  ALBUMIN 2.7* 2.4* 2.5* 2.6* 2.4*   No results for input(s): "LIPASE", "AMYLASE" in the last 168 hours. No results for input(s): "AMMONIA" in the last 168 hours. Coagulation Profile: Recent Labs  Lab 06/27/23 1604  INR 1.2   Cardiac Enzymes: No results for input(s): "CKTOTAL", "CKMB", "CKMBINDEX", "TROPONINI" in the last 168 hours. BNP (last 3 results) No results for input(s): "PROBNP" in the last 8760 hours. HbA1C: No results for input(s): "HGBA1C" in the last 72 hours. CBG: No results for input(s): "GLUCAP" in the last 168 hours. Lipid Profile: No results for input(s): "CHOL", "HDL", "LDLCALC", "TRIG", "CHOLHDL", "LDLDIRECT" in the last 72 hours. Thyroid Function Tests: No results for input(s): "TSH",  "T4TOTAL", "FREET4", "T3FREE", "THYROIDAB" in the last 72 hours. Anemia Panel: No results for input(s): "VITAMINB12", "FOLATE", "FERRITIN", "TIBC", "IRON", "RETICCTPCT" in the last 72 hours. Sepsis Labs: No results for input(s): "PROCALCITON", "LATICACIDVEN" in the last 168 hours.  Recent Results (from the past 240 hour(s))  Culture, blood (Routine X 2) w Reflex to ID Panel     Status: None   Collection Time: 06/20/23  9:40 AM   Specimen: BLOOD LEFT ARM  Result Value Ref Range Status   Specimen Description BLOOD LEFT ARM  Final   Special Requests   Final    BOTTLES DRAWN AEROBIC AND ANAEROBIC Blood Culture adequate volume   Culture   Final    NO GROWTH 5 DAYS Performed at [facility], [address] [place], [place] [address]    Report Status 06/25/2023 FINAL  Final  Culture, blood (Routine X 2) w Reflex to ID Panel     Status: None   Collection Time: 06/20/23  9:49 AM   Specimen: BLOOD RIGHT HAND  Result Value Ref Range Status   Specimen Description BLOOD RIGHT HAND  Final   Special Requests   Final    BOTTLES DRAWN AEROBIC AND ANAEROBIC Blood Culture adequate volume   Culture   Final    NO GROWTH 5 DAYS Performed at [facility], [address], [place], [place] [address]    Report Status 06/25/2023 FINAL  Final    Radiology Studies: No results found.  Scheduled Meds:  docusate sodium  100 mg Oral BID   enoxaparin (LOVENOX) injection  40 mg Subcutaneous Q24H   fluconazole  200 mg Oral Daily   folic acid  1 mg Oral Daily   furosemide  40 mg Oral BID   gabapentin  300 mg Oral TID   methocarbamol  500 mg Oral TID   metoprolol tartrate  50 mg Oral BID   multivitamin with minerals  1 tablet Oral Daily   pantoprazole  40 mg Oral Daily   sodium chloride flush  3 mL Intravenous Q12H   thiamine  100 mg Oral Daily   Continuous Infusions:  penicillin G potassium 12 Million Units in dextrose 5 % 500 mL CONTINUOUS infusion 41.7 mL/hr at 06/29/23 0614    LOS: 11  days   Marlin Canary DO [facility] Available via Epic secure chat 7am-7pm  After these hours, please refer to coverage provider listed on [email or web address] 06/29/2023, 10:24 AM

## 2023-06-29 NOTE — Progress Notes (Signed)
[address]       [place] [address]             [phone]      1 Day Post-Op  Procedure(s) (LRB): ESOPHAGOGASTRODUODENOSCOPY (EGD) WITH PROPOFOL (N/A) COLONOSCOPY WITH PROPOFOL (N/A) BIOPSY SUBMUCOSAL LIFTING INJECTION SUBMUCOSAL TATTOO INJECTION POLYPECTOMY   Total Length of Stay:  LOS: 11 days    SUBJECTIVE: GI work up appreciated  Vitals:   06/29/23 0444 06/29/23 0816  BP: 102/70 135/66  Pulse: 81 95  Resp: 20   Temp: 99.3 F (37.4 C)   SpO2: 98%     Intake/Output      12/06 0701 12/07 0700 12/07 0701 12/08 0700   P.O. 1485    I.V. (mL/kg) 3 (0)    IV Piggyback 1084.2    Total Intake(mL/kg) 2572.2 (29.7)    Urine (mL/kg/hr) 2050 (1)    Blood 0    Total Output 2050    Net +522.2             penicillin G potassium 12 Million Units in dextrose 5 % 500 mL CONTINUOUS infusion 41.7 mL/hr at 06/29/23 0614    CBC    Component Value Date/Time   WBC 9.6 06/29/2023 0217   RBC 2.80 (L) 06/29/2023 0217   HGB 8.0 (L) 06/29/2023 0217   HGB 11.6 (L) 07/27/2020 1417   HCT 26.1 (L) 06/29/2023 0217   HCT 35.8 (L) 07/27/2020 1417   PLT 234 06/29/2023 0217   PLT 177 07/27/2020 1417   MCV 93.2 06/29/2023 0217   MCV 88 07/27/2020 1417   MCH 28.6 06/29/2023 0217   MCHC 30.7 06/29/2023 0217   RDW 16.1 (H) 06/29/2023 0217   RDW 15.2 07/27/2020 1417   LYMPHSABS 2.1 06/29/2023 0217   MONOABS 0.6 06/29/2023 0217   EOSABS 0.2 06/29/2023 0217   BASOSABS 0.0 06/29/2023 0217   CMP     Component Value Date/Time   NA 133 (L) 06/29/2023 0217   NA 140 07/27/2020 1417   K 4.0 06/29/2023 0217   CL 102 06/29/2023 0217   CO2 21 (L) 06/29/2023 0217   GLUCOSE 128 (H) 06/29/2023 0217   BUN 12 06/29/2023 0217   BUN 11 07/27/2020 1417   CREATININE 0.64 06/29/2023 0217   CREATININE 0.95 07/30/2016 0837   CALCIUM 9.0 06/29/2023 0217   PROT 7.1 06/29/2023 0217   PROT 7.8 07/27/2020 1417   ALBUMIN 2.4 (L) 06/29/2023 0217   ALBUMIN 4.3  07/27/2020 1417   AST 34 06/29/2023 0217   ALT 63 (H) 06/29/2023 0217   ALKPHOS 69 06/29/2023 0217   BILITOT 0.4 06/29/2023 0217   BILITOT 0.3 07/27/2020 1417   GFRNONAA >60 06/29/2023 0217   GFRNONAA >89 12/27/2014 0909   GFRAA 108 07/27/2020 1417   GFRAA >89 12/27/2014 0909   ABG    Component Value Date/Time   PHART 7.446 06/24/2023 0950   PCO2ART 29.7 (L) 06/24/2023 0950   PO2ART 71 (L) 06/24/2023 0950   HCO3 20.5 06/24/2023 0950   TCO2 21 (L) 06/24/2023 0950   ACIDBASEDEF 3.0 (H) 06/24/2023 0950   O2SAT 95 06/24/2023 0950   CBG (last 3)  No results for input(s): "GLUCAP" in the last 72 hours.   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    Eugenio Hoes,  MD 06/29/2023

## 2023-06-30 DIAGNOSIS — I330 Acute and subacute infective endocarditis: Secondary | ICD-10-CM | POA: Diagnosis not present

## 2023-06-30 DIAGNOSIS — R0602 Shortness of breath: Secondary | ICD-10-CM | POA: Diagnosis not present

## 2023-06-30 DIAGNOSIS — I340 Nonrheumatic mitral (valve) insufficiency: Secondary | ICD-10-CM | POA: Diagnosis not present

## 2023-06-30 LAB — BASIC METABOLIC PANEL
Anion gap: 11 (ref 5–15)
BUN: 11 mg/dL (ref 6–20)
CO2: 22 mmol/L (ref 22–32)
Calcium: 8.9 mg/dL (ref 8.9–10.3)
Chloride: 100 mmol/L (ref 98–111)
Creatinine, Ser: 0.67 mg/dL (ref 0.61–1.24)
GFR, Estimated: >60 mL/min (ref >60)
Glucose, Bld: 122 mg/dL — ABNORMAL HIGH (ref 70–99)
Potassium: 3.9 mmol/L (ref 3.5–5.1)
Sodium: 133 mmol/L — ABNORMAL LOW (ref 135–145)

## 2023-06-30 LAB — CBC WITH DIFFERENTIAL/PLATELET
Abs Immature Granulocytes: 0.06 10*3/uL (ref 0.00–0.07)
Basophils Absolute: 0 10*3/uL (ref 0.0–0.1)
Basophils Relative: 1 %
Eosinophils Absolute: 0.2 10*3/uL (ref 0.0–0.5)
Eosinophils Relative: 3 %
HCT: 25.7 % — ABNORMAL LOW (ref 39.0–52.0)
Hemoglobin: 8 g/dL — ABNORMAL LOW (ref 13.0–17.0)
Immature Granulocytes: 1 %
Lymphocytes Relative: 24 %
Lymphs Abs: 2 10*3/uL (ref 0.7–4.0)
MCH: 28.9 pg (ref 26.0–34.0)
MCHC: 31.1 g/dL (ref 30.0–36.0)
MCV: 92.8 fL (ref 80.0–100.0)
Monocytes Absolute: 0.7 10*3/uL (ref 0.1–1.0)
Monocytes Relative: 9 %
Neutro Abs: 5.5 10*3/uL (ref 1.7–7.7)
Neutrophils Relative %: 62 %
Platelets: 218 10*3/uL (ref 150–400)
RBC: 2.77 MIL/uL — ABNORMAL LOW (ref 4.22–5.81)
RDW: 15.8 % — ABNORMAL HIGH (ref 11.5–15.5)
WBC: 8.6 10*3/uL (ref 4.0–10.5)
nRBC: 0 % (ref 0.0–0.2)

## 2023-06-30 NOTE — Progress Notes (Signed)
Mobility Specialist Progress Note:   06/30/23 1515  Mobility  Activity Ambulated independently in room  Level of Assistance Modified independent, requires aide device or extra time  Assistive Device None  Distance Ambulated (ft) 50 ft  Activity Response Tolerated well  Mobility Referral Yes  Mobility visit 1 Mobility  Mobility Specialist Start Time (ACUTE ONLY) 1500  Mobility Specialist Stop Time (ACUTE ONLY) 1530  Mobility Specialist Time Calculation (min) (ACUTE ONLY) 30 min   Pt agreeable to mobility session. Required no physical assistance throughout. Declined further mobility d/t walking earlier. Back in bed with all needs met.   Addison Lank Mobility Specialist Please contact via SecureChat or  Rehab office at [phone]

## 2023-06-30 NOTE — Plan of Care (Signed)

## 2023-06-30 NOTE — Plan of Care (Signed)
  Problem: Activity: Goal: Risk for activity intolerance will decrease Outcome: Progressing   

## 2023-06-30 NOTE — Progress Notes (Signed)
PROGRESS NOTE    Victor [name]  ZOX:[id number] DOB: September 19, 1970 DOA: 06/17/2023 PCP: Claiborne Rigg, NP   Brief Narrative:  52 years old male with past medical history of hyperlipidemia, obesity, hypertension presented to hospital with exertional shortness of breath, bilateral lower extremity edema and pain for the last 2 weeks.  Patient also complains of shortness of breath at rest and has been having difficulty lying down in bed. Further workup was done and he was found to have a a Strepotcoccal Bacteremmia and Mitral Valve Endocarditis. 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  Cardiac MRI suspicious for hypertrophic cardiomyopathy. Cardiac catheterization done and he had minimal nonobstructive CAD with elevated V waves and filling pressures as well as a subaortic gradient with increased PVCs suggesting potential hyperacute trophic cardiomyopathy.  Cardiothoracic surgery on board, plan for surgery next week Tuesday.   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 Lasix Continue metoprolol Currently he is posted fo his for surgical intervention with Dr. Villa Herb on Tuesday 12/10 (MVR/subaortic myectomy)   History of Hypertension Continue metoprolol   Streptococcal Salivarius Bacteremia and severe mitral valve regurgitation with mitral valve endocarditis Blood Cx x2 done and showed GPC in Chains and was showing Streptococcal Species in 4/4 Cx's with Sensitivities pending; Repeat Blood Cx 06/20/23 showing NGTD at 5 Days ID on board, continue IV  penicillin  HLD Hold statins for now due to abnormal LFTs   Liver cirrhosis, likely compensated Elevated liver enzymes RUQ U/S with noted liver cirrhosis  Acute Hepatitis Panel nonreactive GI consulted, appreciate recs   Hyponatremia Uncertain etiology at this time but likely in the setting of volume overload   Daily CMP   Alcohol Use Disorder Drinks 16 ounce beer probably every day.  Denies any withdrawal symptoms.   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 ID on board, MRI Lumbar Spine with Contrast and showed "Reactive endplate change with endplate enhancement about the L5-S1 interspace, favored to be degenerative in nature, although possible changes of infection are difficult to exclude, and could be considered in the correct clinical setting Neurosurgery team is evaluated the imaging and feels that no neurosurgical intervention is needed and they are just recommending continuing antibiotics  ID recommends continuing treatment with IV antibiotics as above  Normocytic Anemia with Acute Drop on 12/3 Likely from large ulcerated polyp Candida esophagitis Hemoglobin dropped to 6.6, s/p 1 unit of PRBC FOBT negative Hold ASA for now as well as NSAIDs Anemia panel was done and showed an iron level of 18, UIBC of 237, TIBC 255, saturation ratios of 7%, ferritin of 248, folate level 9.8, vitamin B12 level 540 Continue to Monitor for S/Sx of Bleeding; no overt bleeding noted GI consulted, s/p EGD which showed possible Candida esophagitis, recommend fluconazole, and colonoscopy which showed large ulcerated polyp, with 2 to 3 mm polyps, removed using a cold snare.  Pathology pending.  Repeat colonoscopy in 6  months Start fluconazole, continue pantoprazole    Obesity -Complicates overall prognosis and care -Estimated body mass index is 29.94 kg/m as calculated from the following:   Height as of this encounter: 5\' 7"  (1.702 m).   Weight as of this encounter: 86.7 kg.  -Weight Loss and Dietary Counseling given     DVT prophylaxis: enoxaparin (LOVENOX) injection 40 mg Start: 06/25/23 0800 SCDs Start: 06/18/23 0858    Code Status: Full Code Family Communication: None at bedside  Disposition Plan:  Level of care: Telemetry Cardiac Status is: Inpatient Remains inpatient appropriate because: Needs further clinical improvement and clearance by specialist as he is undergoing surgical intervention next week   Consultants:  Cardiology Infectious Diseases Cardiothoracic Surgery Discussed with Neurosurgery  Procedures:    RIGHT AND LEFT CARDIAC CATHETERIZATION AND CORONARY ANGIOGRAPHY    Antimicrobials:  Anti-infectives (From admission, onward)    Start     Dose/Rate Route Frequency Ordered Stop   06/29/23 1000  fluconazole (DIFLUCAN) tablet 200 mg       Placed in "Followed by" Linked Group   200 mg Oral Daily 06/28/23 1121 07/12/23 0959   06/28/23 1215  fluconazole (DIFLUCAN) tablet 400 mg       Placed in "Followed by" Linked Group   400 mg Oral  Once 06/28/23 1121 06/28/23 1144   06/24/23 2200  penicillin G potassium 12 Million Units in dextrose 5 % 500 mL CONTINUOUS infusion        12 Million Units 41.7 mL/hr over 12 Hours Intravenous Every 12 hours 06/24/23 1522     06/21/23 1115  cefTRIAXone (ROCEPHIN) 2 g in sodium chloride 0.9 % 100 mL IVPB  Status:  Discontinued        2 g 200 mL/hr over 30 Minutes Intravenous Every 24 hours 06/21/23 1017 06/24/23 1522   06/19/23 1600  vancomycin (VANCOREADY) IVPB 1250 mg/250 mL  Status:  Discontinued        1,250 mg 166.7 mL/hr over 90 Minutes Intravenous Every 12 hours 06/19/23 0323 06/21/23 1017   06/19/23 1200  ceFEPIme (MAXIPIME) 2 g in sodium chloride 0.9 % 100 mL IVPB  Status:   Discontinued        2 g 200 mL/hr over 30 Minutes Intravenous Every 8 hours 06/19/23 0323 06/20/23 1156   06/19/23 0145  vancomycin (VANCOREADY) IVPB 1750 mg/350 mL        1,750 mg 175 mL/hr over 120 Minutes Intravenous  Once 06/19/23 0051 06/19/23 1044   06/19/23 0145  ceFEPIme (MAXIPIME) 2 g in sodium chloride 0.9 % 100 mL IVPB        2 g 200 mL/hr over 30 Minutes Intravenous  Once 06/19/23 0051 06/19/23 0908   06/19/23 0045  metroNIDAZOLE (FLAGYL) IVPB 500 mg  Status:  Discontinued        500 mg 100 mL/hr over 60 Minutes Intravenous Every 12 hours 06/19/23 0041 06/20/23 1156       Subjective: Met patient walking in the hallway, denies any new complaints.     Objective: Vitals:   06/30/23 0507 06/30/23 0720 06/30/23 1106 06/30/23 1605  BP: 113/60 108/66 (!) 104/57 115/66  Pulse:  81 74 88  Resp: 18 20  20   Temp: 98.4 F (36.9 C) (!) 97.5 F (36.4 C) 98.4 F (36.9 C) 98.5 F (36.9 C)  TempSrc: Oral Oral Oral Oral  SpO2: 94% 96% 97% 100%  Weight: 87.2 kg     Height:        Intake/Output Summary (  Last 24 hours) at 06/30/2023 1812 Last data filed at 06/30/2023 1605 Gross per 24 hour  Intake 2199.43 ml  Output 2375 ml  Net -175.57 ml   Filed Weights   06/28/23 0650 06/29/23 0444 06/30/23 0507  Weight: 86.1 kg 86.6 kg 87.2 kg   Examination: Physical Exam: General: NAD  Cardiovascular: S1, S2 present Respiratory: CTAB Abdomen: Soft, nontender, nondistended, bowel sounds present Musculoskeletal: No bilateral pedal edema noted Skin: Normal Psychiatry: Normal mood    Data Reviewed: I have personally reviewed following labs and imaging studies  CBC: Recent Labs  Lab 06/26/23 0226 06/27/23 0218 06/28/23 0227 06/29/23 0217 06/30/23 0310  WBC 9.1 10.0 9.8 9.6 8.6  NEUTROABS 6.4 6.7 6.9 6.6 5.5  HGB 8.2* 8.6* 8.3* 8.0* 8.0*  HCT 26.7* 27.0* 26.9* 26.1* 25.7*  MCV 93.0 91.8 92.4 93.2 92.8  PLT 238 249 242 234 218   Basic Metabolic Panel: Recent Labs  Lab  06/24/23 0301 06/24/23 0948 06/25/23 0232 06/25/23 1858 06/26/23 0226 06/27/23 0218 06/28/23 0227 06/29/23 0217 06/30/23 0310  NA 132*   < > 130*   < > 133* 131* 132* 133* 133*  K 4.1   < > 5.3*   < > 4.2 4.2 4.1 4.0 3.9  CL 103  --  102   < > 103 96* 99 102 100  CO2 21*  --  20*   < > 23 24 23  21* 22  GLUCOSE 123*  --  235*   < > 122* 114* 117* 128* 122*  BUN 15  --  16   < > 13 16 11 12 11   CREATININE 0.63  --  0.83   < > 0.75 0.86 0.68 0.64 0.67  CALCIUM 8.7*  --  8.5*   < > 8.6* 8.9 9.3 9.0 8.9  MG 2.0  --  2.0  --  1.9  --   --   --   --   PHOS 4.0  --  3.7  --  4.1  --   --   --   --    < > = values in this interval not displayed.   GFR: Estimated Creatinine Clearance: 111.2 mL/min (by C-G formula based on SCr of 0.67 mg/dL). Liver Function Tests: Recent Labs  Lab 06/25/23 1858 06/26/23 0226 06/27/23 0218 06/28/23 0227 06/29/23 0217  AST 139* 129* 98* 67* 34  ALT 111* 113* 109* 91* 63*  ALKPHOS 72 68 67 77 69  BILITOT 0.6 0.4 0.6 0.6 0.4  PROT 7.8 7.2 7.1 7.2 7.1  ALBUMIN 2.7* 2.4* 2.5* 2.6* 2.4*   No results for input(s): "LIPASE", "AMYLASE" in the last 168 hours. No results for input(s): "AMMONIA" in the last 168 hours. Coagulation Profile: Recent Labs  Lab 06/27/23 1604  INR 1.2   Cardiac Enzymes: No results for input(s): "CKTOTAL", "CKMB", "CKMBINDEX", "TROPONINI" in the last 168 hours. BNP (last 3 results) No results for input(s): "PROBNP" in the last 8760 hours. HbA1C: No results for input(s): "HGBA1C" in the last 72 hours. CBG: No results for input(s): "GLUCAP" in the last 168 hours. Lipid Profile: No results for input(s): "CHOL", "HDL", "LDLCALC", "TRIG", "CHOLHDL", "LDLDIRECT" in the last 72 hours. Thyroid Function Tests: No results for input(s): "TSH", "T4TOTAL", "FREET4", "T3FREE", "THYROIDAB" in the last 72 hours. Anemia Panel: No results for input(s): "VITAMINB12", "FOLATE", "FERRITIN", "TIBC", "IRON", "RETICCTPCT" in the last 72  hours. Sepsis Labs: No results for input(s): "PROCALCITON", "LATICACIDVEN" in the last 168 hours.  No results found for  this or any previous visit (from the past 240 hour(s)).   Radiology Studies: No results found.  Scheduled Meds:  docusate sodium  100 mg Oral BID   enoxaparin (LOVENOX) injection  40 mg Subcutaneous Q24H   fluconazole  200 mg Oral Daily   folic acid  1 mg Oral Daily   furosemide  40 mg Oral BID   gabapentin  300 mg Oral TID   methocarbamol  500 mg Oral TID   metoprolol tartrate  50 mg Oral BID   multivitamin with minerals  1 tablet Oral Daily   pantoprazole  40 mg Oral Daily   sodium chloride flush  3 mL Intravenous Q12H   thiamine  100 mg Oral Daily   Continuous Infusions:  penicillin G potassium 12 Million Units in dextrose 5 % 500 mL CONTINUOUS infusion 41.7 mL/hr at 06/30/23 1450    LOS: 12 days   Briant Cedar, MD [facility] Available via Epic secure chat 7am-7pm After these hours, please refer to coverage provider listed on [email or web address] 06/30/2023, 6:12 PM

## 2023-07-01 ENCOUNTER — Encounter (HOSPITAL_COMMUNITY): Payer: Self-pay | Admitting: Gastroenterology

## 2023-07-01 DIAGNOSIS — R0602 Shortness of breath: Secondary | ICD-10-CM | POA: Diagnosis not present

## 2023-07-01 DIAGNOSIS — I330 Acute and subacute infective endocarditis: Secondary | ICD-10-CM | POA: Diagnosis not present

## 2023-07-01 DIAGNOSIS — I340 Nonrheumatic mitral (valve) insufficiency: Secondary | ICD-10-CM | POA: Diagnosis not present

## 2023-07-01 LAB — CBC WITH DIFFERENTIAL/PLATELET
Abs Immature Granulocytes: 0.04 10*3/uL (ref 0.00–0.07)
Basophils Absolute: 0 10*3/uL (ref 0.0–0.1)
Basophils Relative: 0 %
Eosinophils Absolute: 0.3 10*3/uL (ref 0.0–0.5)
Eosinophils Relative: 3 %
HCT: 24.9 % — ABNORMAL LOW (ref 39.0–52.0)
Hemoglobin: 7.8 g/dL — ABNORMAL LOW (ref 13.0–17.0)
Immature Granulocytes: 1 %
Lymphocytes Relative: 28 %
Lymphs Abs: 2.2 10*3/uL (ref 0.7–4.0)
MCH: 28.8 pg (ref 26.0–34.0)
MCHC: 31.3 g/dL (ref 30.0–36.0)
MCV: 91.9 fL (ref 80.0–100.0)
Monocytes Absolute: 0.7 10*3/uL (ref 0.1–1.0)
Monocytes Relative: 9 %
Neutro Abs: 4.8 10*3/uL (ref 1.7–7.7)
Neutrophils Relative %: 59 %
Platelets: 218 10*3/uL (ref 150–400)
RBC: 2.71 MIL/uL — ABNORMAL LOW (ref 4.22–5.81)
RDW: 15.9 % — ABNORMAL HIGH (ref 11.5–15.5)
WBC: 8 10*3/uL (ref 4.0–10.5)
nRBC: 0 % (ref 0.0–0.2)

## 2023-07-01 LAB — BASIC METABOLIC PANEL
Anion gap: 10 (ref 5–15)
BUN: 11 mg/dL (ref 6–20)
CO2: 23 mmol/L (ref 22–32)
Calcium: 8.6 mg/dL — ABNORMAL LOW (ref 8.9–10.3)
Chloride: 99 mmol/L (ref 98–111)
Creatinine, Ser: 0.71 mg/dL (ref 0.61–1.24)
GFR, Estimated: >60 mL/min (ref >60)
Glucose, Bld: 122 mg/dL — ABNORMAL HIGH (ref 70–99)
Potassium: 4 mmol/L (ref 3.5–5.1)
Sodium: 132 mmol/L — ABNORMAL LOW (ref 135–145)

## 2023-07-01 LAB — SURGICAL PCR SCREEN
MRSA, PCR: NEGATIVE
Staphylococcus aureus: NEGATIVE

## 2023-07-01 LAB — SURGICAL PATHOLOGY

## 2023-07-01 MED ORDER — NITROGLYCERIN IN D5W 200-5 MCG/ML-% IV SOLN
2.0000 ug/min | INTRAVENOUS | Status: DC
  Filled 2023-07-01: qty 250

## 2023-07-01 MED ORDER — CHLORHEXIDINE GLUCONATE CLOTH 2 % EX PADS
6.0000 | MEDICATED_PAD | Freq: Once | CUTANEOUS | Status: AC
Start: 2023-07-01 — End: 2023-07-01
  Administered 2023-07-01: 6 via TOPICAL

## 2023-07-01 MED ORDER — BISACODYL 5 MG PO TBEC
5.0000 mg | DELAYED_RELEASE_TABLET | Freq: Once | ORAL | Status: AC
Start: 2023-07-01 — End: 2023-07-01
  Administered 2023-07-01: 5 mg via ORAL
  Filled 2023-07-01: qty 1

## 2023-07-01 MED ORDER — POTASSIUM CHLORIDE 2 MEQ/ML IV SOLN
80.0000 meq | INTRAVENOUS | Status: DC
  Filled 2023-07-01: qty 40

## 2023-07-01 MED ORDER — CHLORHEXIDINE GLUCONATE CLOTH 2 % EX PADS
6.0000 | MEDICATED_PAD | Freq: Once | CUTANEOUS | Status: AC
  Administered 2023-07-02: 6 via TOPICAL

## 2023-07-01 MED ORDER — TRANEXAMIC ACID (OHS) PUMP PRIME SOLUTION
2.0000 mg/kg | INTRAVENOUS | Status: DC
  Filled 2023-07-01: qty 1.75

## 2023-07-01 MED ORDER — CEFAZOLIN SODIUM-DEXTROSE 2-4 GM/100ML-% IV SOLN
2.0000 g | INTRAVENOUS | Status: DC
  Filled 2023-07-01: qty 100

## 2023-07-01 MED ORDER — DIPHENHYDRAMINE-ZINC ACETATE 2-0.1 % EX CREA
TOPICAL_CREAM | Freq: Two times a day (BID) | CUTANEOUS | Status: DC | PRN
  Filled 2023-07-01: qty 28

## 2023-07-01 MED ORDER — TEMAZEPAM 15 MG PO CAPS
15.0000 mg | ORAL_CAPSULE | Freq: Once | ORAL | Status: AC | PRN
  Administered 2023-07-01: 15 mg via ORAL
  Filled 2023-07-01: qty 1

## 2023-07-01 MED ORDER — NOREPINEPHRINE 4 MG/250ML-% IV SOLN
0.0000 ug/min | INTRAVENOUS | Status: DC
  Filled 2023-07-01: qty 250

## 2023-07-01 MED ORDER — METOPROLOL TARTRATE 12.5 MG HALF TABLET
12.5000 mg | ORAL_TABLET | Freq: Once | ORAL | Status: AC
  Administered 2023-07-02: 12.5 mg via ORAL
  Filled 2023-07-01: qty 1

## 2023-07-01 MED ORDER — HEPARIN 30,000 UNITS/1000 ML (OHS) CELLSAVER SOLUTION
Status: DC
  Filled 2023-07-01: qty 1000

## 2023-07-01 MED ORDER — CHLORHEXIDINE GLUCONATE 0.12 % MT SOLN
15.0000 mL | Freq: Once | OROMUCOSAL | Status: AC
  Administered 2023-07-02: 15 mL via OROMUCOSAL
  Filled 2023-07-01: qty 15

## 2023-07-01 MED ORDER — VANCOMYCIN HCL 1.5 G IV SOLR
1500.0000 mg | INTRAVENOUS | Status: AC
  Administered 2023-07-02: 1500 mg via INTRAVENOUS
  Filled 2023-07-01: qty 30

## 2023-07-01 MED ORDER — PHENYLEPHRINE HCL-NACL 20-0.9 MG/250ML-% IV SOLN
30.0000 ug/min | INTRAVENOUS | Status: AC
Start: 2023-07-02 — End: 2023-07-03
  Administered 2023-07-02: 30 ug/min via INTRAVENOUS
  Filled 2023-07-01: qty 250

## 2023-07-01 MED ORDER — MILRINONE LACTATE IN DEXTROSE 20-5 MG/100ML-% IV SOLN
0.3000 ug/kg/min | INTRAVENOUS | Status: DC
  Filled 2023-07-01: qty 100

## 2023-07-01 MED ORDER — TRANEXAMIC ACID (OHS) BOLUS VIA INFUSION
15.0000 mg/kg | INTRAVENOUS | Status: AC
  Administered 2023-07-02: 1314 mg via INTRAVENOUS
  Filled 2023-07-01: qty 1314

## 2023-07-01 MED ORDER — DEXMEDETOMIDINE HCL IN NACL 400 MCG/100ML IV SOLN
0.1000 ug/kg/h | INTRAVENOUS | Status: AC
  Administered 2023-07-02: .4 ug/kg/h via INTRAVENOUS
  Filled 2023-07-01: qty 100

## 2023-07-01 MED ORDER — MANNITOL 20 % IV SOLN
INTRAVENOUS | Status: DC
  Filled 2023-07-01: qty 13

## 2023-07-01 MED ORDER — EPINEPHRINE HCL 5 MG/250ML IV SOLN IN NS
0.0000 ug/min | INTRAVENOUS | Status: DC
  Filled 2023-07-01: qty 250

## 2023-07-01 MED ORDER — PLASMA-LYTE A IV SOLN
INTRAVENOUS | Status: DC
  Filled 2023-07-01: qty 2.5

## 2023-07-01 MED ORDER — TRANEXAMIC ACID 1000 MG/10ML IV SOLN
1.5000 mg/kg/h | INTRAVENOUS | Status: AC
  Administered 2023-07-02: 1.5 mg/kg/h via INTRAVENOUS
  Filled 2023-07-01: qty 25

## 2023-07-01 MED ORDER — VANCOMYCIN HCL 1000 MG IV SOLR
INTRAVENOUS | Status: DC
  Filled 2023-07-01: qty 20

## 2023-07-01 MED ORDER — INSULIN REGULAR(HUMAN) IN NACL 100-0.9 UT/100ML-% IV SOLN
INTRAVENOUS | Status: AC
  Administered 2023-07-02: 1.2 [IU]/h via INTRAVENOUS
  Filled 2023-07-01: qty 100

## 2023-07-01 NOTE — Plan of Care (Signed)
  Problem: Clinical Measurements: Goal: Ability to maintain clinical measurements within normal limits will improve Outcome: Progressing   

## 2023-07-01 NOTE — Progress Notes (Signed)
PROGRESS NOTE    Victor [name]  BJY:[id number] DOB: 08-07-1970 DOA: 06/17/2023 PCP: Claiborne Rigg, NP   Brief Narrative:  52 years old male with past medical history of hyperlipidemia, obesity, hypertension presented to hospital with exertional shortness of breath, bilateral lower extremity edema and pain for the last 2 weeks.  Patient also complains of shortness of breath at rest and has been having difficulty lying down in bed. Further workup was done and he was found to have a a Strepotcoccal Bacteremmia and Mitral Valve Endocarditis. 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  Cardiac MRI suspicious for hypertrophic cardiomyopathy. Cardiac catheterization done and he had minimal nonobstructive CAD with elevated V waves and filling pressures as well as a subaortic gradient with increased PVCs suggesting potential hyperacute trophic cardiomyopathy.  Cardiothoracic surgery on board, plan for surgery next week Tuesday.   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 Lasix Continue metoprolol Currently he is posted fo his for surgical intervention with Dr. Villa Herb on Tuesday 12/10 (MVR/subaortic myectomy)   History of Hypertension Continue metoprolol   Streptococcal Salivarius Bacteremia and severe mitral valve regurgitation with mitral valve endocarditis Blood Cx x2 done and showed GPC in Chains and was showing Streptococcal Species in 4/4 Cx's with Sensitivities pending; Repeat Blood Cx 06/20/23 showing NGTD at 5 Days ID on board, continue IV  penicillin  HLD Hold statins for now due to abnormal LFTs   Liver cirrhosis, likely compensated Elevated liver enzymes RUQ U/S with noted liver cirrhosis  Acute Hepatitis Panel nonreactive GI consulted, appreciate recs   Hyponatremia Uncertain etiology at this time but likely in the setting of volume overload   Daily CMP   Alcohol Use Disorder Drinks 16 ounce beer probably every day.  Denies any withdrawal symptoms.   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 ID on board, MRI Lumbar Spine with Contrast and showed "Reactive endplate change with endplate enhancement about the L5-S1 interspace, favored to be degenerative in nature, although possible changes of infection are difficult to exclude, and could be considered in the correct clinical setting Neurosurgery team is evaluated the imaging and feels that no neurosurgical intervention is needed and they are just recommending continuing antibiotics  ID recommends continuing treatment with IV antibiotics as above  Normocytic Anemia with Acute Drop on 12/3 Likely from large ulcerated polyp Candida esophagitis Hemoglobin dropped to 6.6, s/p 1 unit of PRBC FOBT negative Hold ASA for now as well as NSAIDs Anemia panel was done and showed an iron level of 18, UIBC of 237, TIBC 255, saturation ratios of 7%, ferritin of 248, folate level 9.8, vitamin B12 level 540 Continue to Monitor for S/Sx of Bleeding; no overt bleeding noted GI consulted, s/p EGD which showed possible Candida esophagitis, recommend fluconazole, and colonoscopy which showed large ulcerated polyp, with 2 to 3 mm polyps, removed using a cold snare.  Pathology pending.  Repeat colonoscopy in 6  months Continue fluconazole, continue pantoprazole    Obesity -Complicates overall prognosis and care -Estimated body mass index is 29.94 kg/m as calculated from the following:   Height as of this encounter: 5\' 7"  (1.702 m).   Weight as of this encounter: 86.7 kg.  -Weight Loss and Dietary Counseling given     DVT prophylaxis: enoxaparin (LOVENOX) injection 40 mg Start: 06/25/23 0800 SCDs Start: 06/18/23 0858    Code Status: Full Code Family Communication: None at bedside  Disposition Plan:  Level of care: Telemetry Cardiac Status is: Inpatient Remains inpatient appropriate because: Needs further clinical improvement and clearance by specialist as he is undergoing surgical intervention next week   Consultants:  Cardiology Infectious Diseases Cardiothoracic Surgery Discussed with Neurosurgery  Procedures:    RIGHT AND LEFT CARDIAC CATHETERIZATION AND CORONARY ANGIOGRAPHY    Antimicrobials:  Anti-infectives (From admission, onward)    Start     Dose/Rate Route Frequency Ordered Stop   07/02/23 0400  Vancomycin (VANCOCIN) 1,500 mg in sodium chloride 0.9 % 500 mL IVPB        1,500 mg 250 mL/hr over 120 Minutes Intravenous To Surgery 07/01/23 1357 07/03/23 0400   07/02/23 0400  ceFAZolin (ANCEF) IVPB 2g/100 mL premix        2 g 200 mL/hr over 30 Minutes Intravenous To Surgery 07/01/23 1357 07/03/23 0400   07/02/23 0400  ceFAZolin (ANCEF) IVPB 2g/100 mL premix        2 g 200 mL/hr over 30 Minutes Intravenous To Surgery 07/01/23 1357 07/03/23 0400   07/02/23 0400  vancomycin (VANCOCIN) 1,000 mg in sodium chloride 0.9 % 1,000 mL irrigation         Irrigation To Surgery 07/01/23 1357 07/03/23 0400   06/29/23 1000  fluconazole (DIFLUCAN) tablet 200 mg       Placed in "Followed by" Linked Group   200 mg Oral Daily 06/28/23 1121 07/12/23 0959   06/28/23 1215  fluconazole (DIFLUCAN) tablet 400 mg       Placed in "Followed by" Linked Group   400 mg Oral  Once 06/28/23 1121 06/28/23 1144   06/24/23 2200  penicillin G potassium 12  Million Units in dextrose 5 % 500 mL CONTINUOUS infusion        12 Million Units 41.7 mL/hr over 12 Hours Intravenous Every 12 hours 06/24/23 1522     06/21/23 1115  cefTRIAXone (ROCEPHIN) 2 g in sodium chloride 0.9 % 100 mL IVPB  Status:  Discontinued        2 g 200 mL/hr over 30 Minutes Intravenous Every 24 hours 06/21/23 1017 06/24/23 1522   06/19/23 1600  vancomycin (VANCOREADY) IVPB 1250 mg/250 mL  Status:  Discontinued        1,250 mg 166.7 mL/hr over 90 Minutes Intravenous Every 12 hours 06/19/23 0323 06/21/23 1017   06/19/23 1200  ceFEPIme (MAXIPIME) 2 g in sodium chloride 0.9 % 100 mL IVPB  Status:  Discontinued        2 g 200 mL/hr over 30 Minutes Intravenous Every 8 hours 06/19/23 0323 06/20/23 1156   06/19/23 0145  vancomycin (VANCOREADY) IVPB 1750 mg/350 mL        1,750 mg 175 mL/hr over 120 Minutes Intravenous  Once 06/19/23 0051 06/19/23 1044   06/19/23 0145  ceFEPIme (MAXIPIME) 2 g in sodium chloride 0.9 % 100 mL IVPB        2 g 200 mL/hr over 30 Minutes Intravenous  Once 06/19/23 0051 06/19/23 0908   06/19/23  0045  metroNIDAZOLE (FLAGYL) IVPB 500 mg  Status:  Discontinued        500 mg 100 mL/hr over 60 Minutes Intravenous Every 12 hours 06/19/23 0041 06/20/23 1156       Subjective: Denies any new complaints     Objective: Vitals:   07/01/23 0717 07/01/23 0855 07/01/23 1034 07/01/23 1038  BP: 112/63 (!) 140/104 (!) 97/56 (!) 96/58  Pulse: 83 91 77   Resp:   20 20  Temp: 97.6 F (36.4 C)  98.7 F (37.1 C)   TempSrc: Oral  Oral   SpO2: 98%  97% 98%  Weight:      Height:        Intake/Output Summary (Last 24 hours) at 07/01/2023 1445 Last data filed at 07/01/2023 1430 Gross per 24 hour  Intake 1726.35 ml  Output 4150 ml  Net -2423.65 ml   Filed Weights   06/29/23 0444 06/30/23 0507 07/01/23 0418  Weight: 86.6 kg 87.2 kg 87.6 kg   Examination: Physical Exam: General: NAD  Cardiovascular: S1, S2 present Respiratory: CTAB Abdomen: Soft, nontender,  nondistended, bowel sounds present Musculoskeletal: No bilateral pedal edema noted Skin: Normal Psychiatry: Normal mood    Data Reviewed: I have personally reviewed following labs and imaging studies  CBC: Recent Labs  Lab 06/27/23 0218 06/28/23 0227 06/29/23 0217 06/30/23 0310 07/01/23 0236  WBC 10.0 9.8 9.6 8.6 8.0  NEUTROABS 6.7 6.9 6.6 5.5 4.8  HGB 8.6* 8.3* 8.0* 8.0* 7.8*  HCT 27.0* 26.9* 26.1* 25.7* 24.9*  MCV 91.8 92.4 93.2 92.8 91.9  PLT 249 242 234 218 218   Basic Metabolic Panel: Recent Labs  Lab 06/25/23 0232 06/25/23 1858 06/26/23 0226 06/27/23 0218 06/28/23 0227 06/29/23 0217 06/30/23 0310 07/01/23 0236  NA 130*   < > 133* 131* 132* 133* 133* 132*  K 5.3*   < > 4.2 4.2 4.1 4.0 3.9 4.0  CL 102   < > 103 96* 99 102 100 99  CO2 20*   < > 23 24 23  21* 22 23  GLUCOSE 235*   < > 122* 114* 117* 128* 122* 122*  BUN 16   < > 13 16 11 12 11 11   CREATININE 0.83   < > 0.75 0.86 0.68 0.64 0.67 0.71  CALCIUM 8.5*   < > 8.6* 8.9 9.3 9.0 8.9 8.6*  MG 2.0  --  1.9  --   --   --   --   --   PHOS 3.7  --  4.1  --   --   --   --   --    < > = values in this interval not displayed.   GFR: Estimated Creatinine Clearance: 111.5 mL/min (by C-G formula based on SCr of 0.71 mg/dL). Liver Function Tests: Recent Labs  Lab 06/25/23 1858 06/26/23 0226 06/27/23 0218 06/28/23 0227 06/29/23 0217  AST 139* 129* 98* 67* 34  ALT 111* 113* 109* 91* 63*  ALKPHOS 72 68 67 77 69  BILITOT 0.6 0.4 0.6 0.6 0.4  PROT 7.8 7.2 7.1 7.2 7.1  ALBUMIN 2.7* 2.4* 2.5* 2.6* 2.4*   No results for input(s): "LIPASE", "AMYLASE" in the last 168 hours. No results for input(s): "AMMONIA" in the last 168 hours. Coagulation Profile: Recent Labs  Lab 06/27/23 1604  INR 1.2   Cardiac Enzymes: No results for input(s): "CKTOTAL", "CKMB", "CKMBINDEX", "TROPONINI" in the last 168 hours. BNP (last 3 results) No results for input(s): "PROBNP" in the last 8760 hours.  HbA1C: No results for  input(s): "HGBA1C" in the last 72 hours. CBG: No results for input(s): "GLUCAP" in the last 168 hours. Lipid Profile: No results for input(s): "CHOL", "HDL", "LDLCALC", "TRIG", "CHOLHDL", "LDLDIRECT" in the last 72 hours. Thyroid Function Tests: No results for input(s): "TSH", "T4TOTAL", "FREET4", "T3FREE", "THYROIDAB" in the last 72 hours. Anemia Panel: No results for input(s): "VITAMINB12", "FOLATE", "FERRITIN", "TIBC", "IRON", "RETICCTPCT" in the last 72 hours. Sepsis Labs: No results for input(s): "PROCALCITON", "LATICACIDVEN" in the last 168 hours.  No results found for this or any previous visit (from the past 240 hour(s)).   Radiology Studies: No results found.  Scheduled Meds:  docusate sodium  100 mg Oral BID   enoxaparin (LOVENOX) injection  40 mg Subcutaneous Q24H   [START ON 07/02/2023] epinephrine  0-10 mcg/min Intravenous To OR   fluconazole  200 mg Oral Daily   folic acid  1 mg Oral Daily   furosemide  40 mg Oral BID   gabapentin  300 mg Oral TID   [START ON 07/02/2023] heparin sodium (porcine) 2,500 Units, papaverine 30 mg in electrolyte-A (PLASMALYTE-A PH 7.4) 500 mL irrigation   Irrigation To OR   [START ON 07/02/2023] insulin   Intravenous To OR   [START ON 07/02/2023] [place] Blood Cardioplegia vial (lidocaine/magnesium/mannitol 0.26g-4g-6.4g)   Intracoronary To OR   [START ON 07/02/2023] [place] Blood Cardioplegia vial (lidocaine/magnesium/mannitol 0.26g-4g-6.4g)   Intracoronary To OR   methocarbamol  500 mg Oral TID   metoprolol tartrate  50 mg Oral BID   multivitamin with minerals  1 tablet Oral Daily   pantoprazole  40 mg Oral Daily   [START ON 07/02/2023] phenylephrine  30-200 mcg/min Intravenous To OR   [START ON 07/02/2023] potassium chloride  80 mEq Other To OR   sodium chloride flush  3 mL Intravenous Q12H   thiamine  100 mg Oral Daily   [START ON 07/02/2023] tranexamic acid  15 mg/kg Intravenous To OR   [START ON 07/02/2023] tranexamic acid  2  mg/kg Intracatheter To OR   [START ON 07/02/2023] vancomycin (VANCOCIN) 1,000 mg in sodium chloride 0.9 % 1,000 mL irrigation   Irrigation To OR   Continuous Infusions:  [START ON 07/02/2023]  ceFAZolin (ANCEF) IV     [START ON 07/02/2023]  ceFAZolin (ANCEF) IV     [START ON 07/02/2023] dexmedetomidine     [START ON 07/02/2023] heparin 30,000 units/NS 1000 mL solution for CELLSAVER     [START ON 07/02/2023] milrinone     [START ON 07/02/2023] nitroGLYCERIN     [START ON 07/02/2023] norepinephrine     penicillin G potassium 12 Million Units in dextrose 5 % 500 mL CONTINUOUS infusion 12 Million Units (07/01/23 0901)   [START ON 07/02/2023] tranexamic acid (CYKLOKAPRON) 2,500 mg in sodium chloride 0.9 % 250 mL (10 mg/mL) infusion     [START ON 07/02/2023] vancomycin      LOS: 13 days   Briant Cedar, MD [facility] Available via Epic secure chat 7am-7pm After these hours, please refer to coverage provider listed on [email or web address] 07/01/2023, 2:45 PM

## 2023-07-01 NOTE — Plan of Care (Signed)

## 2023-07-01 NOTE — Hospital Course (Addendum)
History of Present Illness:     Victor [name] 52 y.o. 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  He originally was admitted to [facility] on 11/26 with complaints of shortness of breath and lower extremity swelling.  His blood cultures were positive for streptococcus salivarius. He was empirically placed on IV cefepime, vancomycin and metronidazole. ID was consulted and this was de-escalated to IV Penicillin. Follow up cultures show no growth to date. TEE on 11/29 showed a subaortic membrane in the LVOT, a vegetation on the PMVL measuring 0.8cm x 0.6 cm with severe mitral valve regurgitation, mild mitral stenosis and EF 65-70%.There was also 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 Cardiac MRI showed possible HOCM with dynamic outflow gradient. 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 Neurosurgery was consulted and recommended no neurosurgery intervention at this time and continued antibiotics.   Dr. Leafy Ro reviewed the patient's diagnostic studies and determined he would benefit from surgical intervention. He reviewed the treatment options as well as the risks and benefits of surgery. Victor [name] was agreeable to proceed with surgery.  Hospital Course: Victor [name] remained an inpatient at [facility]. The patient was anemic, GI was consulted. Abdominal ultrasound was suggestive of cirrhosis and the patient drinks about 16 ounces of beer per day. He was started on thiamine, folic acid and a multivitamin. EGD showed candida esophagitis, fluconazole was started. Colonoscopy showed a large ulcerated polyp and 2-33mm polyps removed with a cold snare.  Victor [name] was taken to the operating room on 07/02/23 and underwent mitral valve replacement utilizing a 29mm St Jude mechanical valve, resection of the subaortic membrane and septal myectomy. He tolerated the procedure well and was transferred to the  SICU in stable condition. IV PCN G and fluconazole was continued postoperatively. He developed postoperative mediastinal bleeding and was taken back to the OR for reexploration. Clot was removed and the pericardial bleeding site was controlled. He tolerated the procedure well and was transferred back to the SICU in stable condition. He was extubated the morning after surgery without complication. Drips were weaned as hemodynamics tolerated. Arterial line and swan ganz catheter were removed on POD1. He was started on Lopressor and Norvasc for hypertension. He was routinely diuresed. Coumadin was started as well as low dose heparin until INR was close to goal. Epicardial pacing wires and chest tubes were removed without complication. He had a right pleural effusion but it was not felt large enough to warrant thoracentesis.  He was diuresed more aggressively with torsemide.  Follow-up chest x-ray showed improvement in the bilateral pleural effusions and atelectasis.  He was felt stable for transfer to the progressive unit.  Continuous IV penicillin infusion was continued per ID recommendations.  Requested placement of a PICC line for continuation of IV antibiotic therapy at home.  The transition of care team assisted with home health nursing and infusion services for the home setting.  He had expected postoperative acute blood loss anemia that was monitored frequently.  He was given iron supplements.

## 2023-07-01 NOTE — Progress Notes (Signed)
Mobility Specialist Progress Note:    07/01/23 1123  Mobility  Activity Moved into chair position in bed (bed mobility)  Level of Assistance Independent  Assistive Device None  Activity Response Tolerated well  Mobility Referral Yes  Mobility visit 1 Mobility  Mobility Specialist Start Time (ACUTE ONLY) 0908  Mobility Specialist Stop Time (ACUTE ONLY) [id number]  Mobility Specialist Time Calculation (min) (ACUTE ONLY) 10 min   Pt received in bed, stating he just got back from a walk but is agreeable to practice some sternal precautions. Pt was able to perform and restate precautions w/o fault. Situated in bed w/ call bell and personal belongings in reach. All needs met.   Thompson Grayer Mobility Specialist  Please contact vis Secure Chat or  Rehab Office [phone]

## 2023-07-02 ENCOUNTER — Encounter (HOSPITAL_COMMUNITY)
Admission: EM | Disposition: A | Discharge status: Home Health | Payer: Self-pay | Source: Home / Self Care | Attending: Internal Medicine

## 2023-07-02 ENCOUNTER — Inpatient Hospital Stay (HOSPITAL_COMMUNITY): Payer: Self-pay | Admitting: Anesthesiology

## 2023-07-02 ENCOUNTER — Inpatient Hospital Stay (HOSPITAL_COMMUNITY): Payer: Medicaid Other

## 2023-07-02 ENCOUNTER — Encounter: Payer: Self-pay | Admitting: Gastroenterology

## 2023-07-02 ENCOUNTER — Inpatient Hospital Stay (HOSPITAL_COMMUNITY): Payer: Medicaid Other | Admitting: Certified Registered Nurse Anesthetist

## 2023-07-02 ENCOUNTER — Encounter (HOSPITAL_COMMUNITY): Payer: Self-pay | Admitting: Internal Medicine

## 2023-07-02 DIAGNOSIS — R0602 Shortness of breath: Secondary | ICD-10-CM | POA: Diagnosis not present

## 2023-07-02 DIAGNOSIS — I339 Acute and subacute endocarditis, unspecified: Secondary | ICD-10-CM | POA: Diagnosis not present

## 2023-07-02 DIAGNOSIS — I421 Obstructive hypertrophic cardiomyopathy: Secondary | ICD-10-CM | POA: Diagnosis not present

## 2023-07-02 DIAGNOSIS — Z952 Presence of prosthetic heart valve: Secondary | ICD-10-CM

## 2023-07-02 DIAGNOSIS — I509 Heart failure, unspecified: Secondary | ICD-10-CM | POA: Insufficient documentation

## 2023-07-02 DIAGNOSIS — J9811 Atelectasis: Secondary | ICD-10-CM | POA: Diagnosis not present

## 2023-07-02 DIAGNOSIS — Z4682 Encounter for fitting and adjustment of non-vascular catheter: Secondary | ICD-10-CM | POA: Diagnosis not present

## 2023-07-02 DIAGNOSIS — I97610 Postprocedural hemorrhage and hematoma of a circulatory system organ or structure following a cardiac catheterization: Secondary | ICD-10-CM | POA: Diagnosis not present

## 2023-07-02 DIAGNOSIS — I5189 Other ill-defined heart diseases: Secondary | ICD-10-CM | POA: Diagnosis not present

## 2023-07-02 DIAGNOSIS — D123 Benign neoplasm of transverse colon: Secondary | ICD-10-CM | POA: Diagnosis not present

## 2023-07-02 DIAGNOSIS — R0989 Other specified symptoms and signs involving the circulatory and respiratory systems: Secondary | ICD-10-CM | POA: Diagnosis not present

## 2023-07-02 DIAGNOSIS — I340 Nonrheumatic mitral (valve) insufficiency: Secondary | ICD-10-CM

## 2023-07-02 DIAGNOSIS — I10 Essential (primary) hypertension: Secondary | ICD-10-CM | POA: Diagnosis not present

## 2023-07-02 DIAGNOSIS — J449 Chronic obstructive pulmonary disease, unspecified: Secondary | ICD-10-CM | POA: Diagnosis not present

## 2023-07-02 DIAGNOSIS — Z452 Encounter for adjustment and management of vascular access device: Secondary | ICD-10-CM | POA: Diagnosis not present

## 2023-07-02 DIAGNOSIS — I081 Rheumatic disorders of both mitral and tricuspid valves: Secondary | ICD-10-CM | POA: Diagnosis not present

## 2023-07-02 DIAGNOSIS — K6389 Other specified diseases of intestine: Secondary | ICD-10-CM | POA: Diagnosis not present

## 2023-07-02 HISTORY — PX: MITRAL VALVE REPLACEMENT: SHX147

## 2023-07-02 HISTORY — PX: EXPLORATION POST OPERATIVE OPEN HEART: SHX5061

## 2023-07-02 HISTORY — PX: TEE WITHOUT CARDIOVERSION: SHX5443

## 2023-07-02 LAB — POCT I-STAT 7, (LYTES, BLD GAS, ICA,H+H)
Acid-Base Excess: 1 mmol/L (ref 0.0–2.0)
Acid-base deficit: 2 mmol/L (ref 0.0–2.0)
Acid-base deficit: 2 mmol/L (ref 0.0–2.0)
Acid-base deficit: 2 mmol/L (ref 0.0–2.0)
Acid-base deficit: 3 mmol/L — ABNORMAL HIGH (ref 0.0–2.0)
Acid-base deficit: 2 mmol/L (ref 0.0–2.0)
Acid-base deficit: 1 mmol/L (ref 0.0–2.0)
Acid-base deficit: 3 mmol/L — ABNORMAL HIGH (ref 0.0–2.0)
Bicarbonate: 24.3 mmol/L (ref 20.0–28.0)
Bicarbonate: 23.9 mmol/L (ref 20.0–28.0)
Bicarbonate: 24.4 mmol/L (ref 20.0–28.0)
Bicarbonate: 22.5 mmol/L (ref 20.0–28.0)
Bicarbonate: 22.7 mmol/L (ref 20.0–28.0)
Bicarbonate: 24 mmol/L (ref 20.0–28.0)
Bicarbonate: 25 mmol/L (ref 20.0–28.0)
Bicarbonate: 21.8 mmol/L (ref 20.0–28.0)
Calcium, Ion: 1.06 mmol/L — ABNORMAL LOW (ref 1.15–1.40)
Calcium, Ion: 1.32 mmol/L (ref 1.15–1.40)
Calcium, Ion: 1.42 mmol/L — ABNORMAL HIGH (ref 1.15–1.40)
Calcium, Ion: 1.31 mmol/L (ref 1.15–1.40)
Calcium, Ion: 1.15 mmol/L (ref 1.15–1.40)
Calcium, Ion: 1.42 mmol/L — ABNORMAL HIGH (ref 1.15–1.40)
Calcium, Ion: 1.26 mmol/L (ref 1.15–1.40)
Calcium, Ion: 1.22 mmol/L (ref 1.15–1.40)
HCT: 20 % — ABNORMAL LOW (ref 39.0–52.0)
HCT: 23 % — ABNORMAL LOW (ref 39.0–52.0)
HCT: 25 % — ABNORMAL LOW (ref 39.0–52.0)
HCT: 19 % — ABNORMAL LOW (ref 39.0–52.0)
HCT: 22 % — ABNORMAL LOW (ref 39.0–52.0)
HCT: 18 % — ABNORMAL LOW (ref 39.0–52.0)
HCT: 25 % — ABNORMAL LOW (ref 39.0–52.0)
HCT: 26 % — ABNORMAL LOW (ref 39.0–52.0)
Hemoglobin: 6.8 g/dL — CL (ref 13.0–17.0)
Hemoglobin: 7.8 g/dL — ABNORMAL LOW (ref 13.0–17.0)
Hemoglobin: 8.5 g/dL — ABNORMAL LOW (ref 13.0–17.0)
Hemoglobin: 6.5 g/dL — CL (ref 13.0–17.0)
Hemoglobin: 7.5 g/dL — ABNORMAL LOW (ref 13.0–17.0)
Hemoglobin: 6.1 g/dL — CL (ref 13.0–17.0)
Hemoglobin: 8.5 g/dL — ABNORMAL LOW (ref 13.0–17.0)
Hemoglobin: 8.8 g/dL — ABNORMAL LOW (ref 13.0–17.0)
O2 Saturation: 100 %
O2 Saturation: 100 %
O2 Saturation: 91 %
O2 Saturation: 99 %
O2 Saturation: 100 %
O2 Saturation: 100 %
O2 Saturation: 100 %
O2 Saturation: 99 %
Patient temperature: 36.9
Patient temperature: 36.8
Patient temperature: 35.6
Patient temperature: 36.2
Patient temperature: 37
Potassium: 3.9 mmol/L (ref 3.5–5.1)
Potassium: 4.8 mmol/L (ref 3.5–5.1)
Potassium: 4.9 mmol/L (ref 3.5–5.1)
Potassium: 5 mmol/L (ref 3.5–5.1)
Potassium: 5.1 mmol/L (ref 3.5–5.1)
Potassium: 5 mmol/L (ref 3.5–5.1)
Potassium: 5.4 mmol/L — ABNORMAL HIGH (ref 3.5–5.1)
Potassium: 4.6 mmol/L (ref 3.5–5.1)
Sodium: 135 mmol/L (ref 135–145)
Sodium: 134 mmol/L — ABNORMAL LOW (ref 135–145)
Sodium: 135 mmol/L (ref 135–145)
Sodium: 135 mmol/L (ref 135–145)
Sodium: 137 mmol/L (ref 135–145)
Sodium: 134 mmol/L — ABNORMAL LOW (ref 135–145)
Sodium: 135 mmol/L (ref 135–145)
Sodium: 134 mmol/L — ABNORMAL LOW (ref 135–145)
TCO2: 25 mmol/L (ref 22–32)
TCO2: 25 mmol/L (ref 22–32)
TCO2: 26 mmol/L (ref 22–32)
TCO2: 24 mmol/L (ref 22–32)
TCO2: 24 mmol/L (ref 22–32)
TCO2: 25 mmol/L (ref 22–32)
TCO2: 26 mmol/L (ref 22–32)
TCO2: 23 mmol/L (ref 22–32)
pCO2 arterial: 33.5 mm[Hg] (ref 32–48)
pCO2 arterial: 44.3 mm[Hg] (ref 32–48)
pCO2 arterial: 52.7 mm[Hg] — ABNORMAL HIGH (ref 32–48)
pCO2 arterial: 36.6 mm[Hg] (ref 32–48)
pCO2 arterial: 45.1 mm[Hg] (ref 32–48)
pCO2 arterial: 44.7 mm[Hg] (ref 32–48)
pCO2 arterial: 45.8 mm[Hg] (ref 32–48)
pCO2 arterial: 36.7 mm[Hg] (ref 32–48)
pH, Arterial: 7.468 — ABNORMAL HIGH (ref 7.35–7.45)
pH, Arterial: 7.339 — ABNORMAL LOW (ref 7.35–7.45)
pH, Arterial: 7.273 — ABNORMAL LOW (ref 7.35–7.45)
pH, Arterial: 7.395 (ref 7.35–7.45)
pH, Arterial: 7.309 — ABNORMAL LOW (ref 7.35–7.45)
pH, Arterial: 7.332 — ABNORMAL LOW (ref 7.35–7.45)
pH, Arterial: 7.341 — ABNORMAL LOW (ref 7.35–7.45)
pH, Arterial: 7.382 (ref 7.35–7.45)
pO2, Arterial: 332 mm[Hg] — ABNORMAL HIGH (ref 83–108)
pO2, Arterial: 215 mm[Hg] — ABNORMAL HIGH (ref 83–108)
pO2, Arterial: 69 mm[Hg] — ABNORMAL LOW (ref 83–108)
pO2, Arterial: 146 mm[Hg] — ABNORMAL HIGH (ref 83–108)
pO2, Arterial: 339 mm[Hg] — ABNORMAL HIGH (ref 83–108)
pO2, Arterial: 304 mm[Hg] — ABNORMAL HIGH (ref 83–108)
pO2, Arterial: 340 mm[Hg] — ABNORMAL HIGH (ref 83–108)
pO2, Arterial: 156 mm[Hg] — ABNORMAL HIGH (ref 83–108)

## 2023-07-02 LAB — PREPARE RBC (CROSSMATCH)

## 2023-07-02 LAB — PLATELET COUNT
Platelets: 171 10*3/uL (ref 150–400)
Platelets: 127 10*3/uL — ABNORMAL LOW (ref 150–400)

## 2023-07-02 LAB — POCT I-STAT, CHEM 8
BUN: 12 mg/dL (ref 6–20)
BUN: 14 mg/dL (ref 6–20)
BUN: 13 mg/dL (ref 6–20)
BUN: 14 mg/dL (ref 6–20)
BUN: 14 mg/dL (ref 6–20)
BUN: 12 mg/dL (ref 6–20)
BUN: 12 mg/dL (ref 6–20)
Calcium, Ion: 1.18 mmol/L (ref 1.15–1.40)
Calcium, Ion: 1.21 mmol/L (ref 1.15–1.40)
Calcium, Ion: 1.09 mmol/L — ABNORMAL LOW (ref 1.15–1.40)
Calcium, Ion: 1.13 mmol/L — ABNORMAL LOW (ref 1.15–1.40)
Calcium, Ion: 1.32 mmol/L (ref 1.15–1.40)
Calcium, Ion: 1.16 mmol/L (ref 1.15–1.40)
Calcium, Ion: 1.26 mmol/L (ref 1.15–1.40)
Chloride: 101 mmol/L (ref 98–111)
Chloride: 99 mmol/L (ref 98–111)
Chloride: 95 mmol/L — ABNORMAL LOW (ref 98–111)
Chloride: 96 mmol/L — ABNORMAL LOW (ref 98–111)
Chloride: 100 mmol/L (ref 98–111)
Chloride: 102 mmol/L (ref 98–111)
Chloride: 100 mmol/L (ref 98–111)
Creatinine, Ser: 0.5 mg/dL — ABNORMAL LOW (ref 0.61–1.24)
Creatinine, Ser: 0.6 mg/dL — ABNORMAL LOW (ref 0.61–1.24)
Creatinine, Ser: 0.6 mg/dL — ABNORMAL LOW (ref 0.61–1.24)
Creatinine, Ser: 0.6 mg/dL — ABNORMAL LOW (ref 0.61–1.24)
Creatinine, Ser: 0.7 mg/dL (ref 0.61–1.24)
Creatinine, Ser: 0.6 mg/dL — ABNORMAL LOW (ref 0.61–1.24)
Creatinine, Ser: 0.5 mg/dL — ABNORMAL LOW (ref 0.61–1.24)
Glucose, Bld: 106 mg/dL — ABNORMAL HIGH (ref 70–99)
Glucose, Bld: 124 mg/dL — ABNORMAL HIGH (ref 70–99)
Glucose, Bld: 213 mg/dL — ABNORMAL HIGH (ref 70–99)
Glucose, Bld: 221 mg/dL — ABNORMAL HIGH (ref 70–99)
Glucose, Bld: 199 mg/dL — ABNORMAL HIGH (ref 70–99)
Glucose, Bld: 144 mg/dL — ABNORMAL HIGH (ref 70–99)
Glucose, Bld: 184 mg/dL — ABNORMAL HIGH (ref 70–99)
HCT: 23 % — ABNORMAL LOW (ref 39.0–52.0)
HCT: 22 % — ABNORMAL LOW (ref 39.0–52.0)
HCT: 23 % — ABNORMAL LOW (ref 39.0–52.0)
HCT: 23 % — ABNORMAL LOW (ref 39.0–52.0)
HCT: 23 % — ABNORMAL LOW (ref 39.0–52.0)
HCT: 21 % — ABNORMAL LOW (ref 39.0–52.0)
HCT: 27 % — ABNORMAL LOW (ref 39.0–52.0)
Hemoglobin: 7.8 g/dL — ABNORMAL LOW (ref 13.0–17.0)
Hemoglobin: 7.5 g/dL — ABNORMAL LOW (ref 13.0–17.0)
Hemoglobin: 7.8 g/dL — ABNORMAL LOW (ref 13.0–17.0)
Hemoglobin: 7.8 g/dL — ABNORMAL LOW (ref 13.0–17.0)
Hemoglobin: 7.8 g/dL — ABNORMAL LOW (ref 13.0–17.0)
Hemoglobin: 7.1 g/dL — ABNORMAL LOW (ref 13.0–17.0)
Hemoglobin: 9.2 g/dL — ABNORMAL LOW (ref 13.0–17.0)
Potassium: 3.9 mmol/L (ref 3.5–5.1)
Potassium: 4.3 mmol/L (ref 3.5–5.1)
Potassium: 4.6 mmol/L (ref 3.5–5.1)
Potassium: 4.9 mmol/L (ref 3.5–5.1)
Potassium: 4.8 mmol/L (ref 3.5–5.1)
Potassium: 5.1 mmol/L (ref 3.5–5.1)
Potassium: 5.5 mmol/L — ABNORMAL HIGH (ref 3.5–5.1)
Sodium: 138 mmol/L (ref 135–145)
Sodium: 134 mmol/L — ABNORMAL LOW (ref 135–145)
Sodium: 134 mmol/L — ABNORMAL LOW (ref 135–145)
Sodium: 135 mmol/L (ref 135–145)
Sodium: 133 mmol/L — ABNORMAL LOW (ref 135–145)
Sodium: 136 mmol/L (ref 135–145)
Sodium: 135 mmol/L (ref 135–145)
TCO2: 26 mmol/L (ref 22–32)
TCO2: 25 mmol/L (ref 22–32)
TCO2: 25 mmol/L (ref 22–32)
TCO2: 25 mmol/L (ref 22–32)
TCO2: 24 mmol/L (ref 22–32)
TCO2: 24 mmol/L (ref 22–32)
TCO2: 26 mmol/L (ref 22–32)

## 2023-07-02 LAB — CBC WITH DIFFERENTIAL/PLATELET
Abs Immature Granulocytes: 0.03 10*3/uL (ref 0.00–0.07)
Basophils Absolute: 0 10*3/uL (ref 0.0–0.1)
Basophils Relative: 0 %
Eosinophils Absolute: 0.2 10*3/uL (ref 0.0–0.5)
Eosinophils Relative: 3 %
HCT: 24.6 % — ABNORMAL LOW (ref 39.0–52.0)
Hemoglobin: 7.7 g/dL — ABNORMAL LOW (ref 13.0–17.0)
Immature Granulocytes: 0 %
Lymphocytes Relative: 26 %
Lymphs Abs: 1.9 10*3/uL (ref 0.7–4.0)
MCH: 28.6 pg (ref 26.0–34.0)
MCHC: 31.3 g/dL (ref 30.0–36.0)
MCV: 91.4 fL (ref 80.0–100.0)
Monocytes Absolute: 0.6 10*3/uL (ref 0.1–1.0)
Monocytes Relative: 8 %
Neutro Abs: 4.7 10*3/uL (ref 1.7–7.7)
Neutrophils Relative %: 63 %
Platelets: 200 10*3/uL (ref 150–400)
RBC: 2.69 MIL/uL — ABNORMAL LOW (ref 4.22–5.81)
RDW: 15.9 % — ABNORMAL HIGH (ref 11.5–15.5)
WBC: 7.5 10*3/uL (ref 4.0–10.5)
nRBC: 0 % (ref 0.0–0.2)

## 2023-07-02 LAB — CBC
HCT: 24.2 % — ABNORMAL LOW (ref 39.0–52.0)
HCT: 26 % — ABNORMAL LOW (ref 39.0–52.0)
Hemoglobin: 7.7 g/dL — ABNORMAL LOW (ref 13.0–17.0)
Hemoglobin: 8.8 g/dL — ABNORMAL LOW (ref 13.0–17.0)
MCH: 29.3 pg (ref 26.0–34.0)
MCH: 29.9 pg (ref 26.0–34.0)
MCHC: 31.8 g/dL (ref 30.0–36.0)
MCHC: 33.8 g/dL (ref 30.0–36.0)
MCV: 92 fL (ref 80.0–100.0)
MCV: 88.4 fL (ref 80.0–100.0)
Platelets: 143 10*3/uL — ABNORMAL LOW (ref 150–400)
Platelets: 108 10*3/uL — ABNORMAL LOW (ref 150–400)
RBC: 2.63 MIL/uL — ABNORMAL LOW (ref 4.22–5.81)
RBC: 2.94 MIL/uL — ABNORMAL LOW (ref 4.22–5.81)
RDW: 16.7 % — ABNORMAL HIGH (ref 11.5–15.5)
RDW: 15.5 % (ref 11.5–15.5)
WBC: 22.6 10*3/uL — ABNORMAL HIGH (ref 4.0–10.5)
WBC: 13.4 10*3/uL — ABNORMAL HIGH (ref 4.0–10.5)
nRBC: 0 % (ref 0.0–0.2)
nRBC: 0 % (ref 0.0–0.2)

## 2023-07-02 LAB — POCT I-STAT EG7
Acid-Base Excess: 1 mmol/L (ref 0.0–2.0)
Bicarbonate: 26.1 mmol/L (ref 20.0–28.0)
Calcium, Ion: 1.14 mmol/L — ABNORMAL LOW (ref 1.15–1.40)
HCT: 20 % — ABNORMAL LOW (ref 39.0–52.0)
Hemoglobin: 6.8 g/dL — CL (ref 13.0–17.0)
O2 Saturation: 69 %
Potassium: 4.1 mmol/L (ref 3.5–5.1)
Sodium: 136 mmol/L (ref 135–145)
TCO2: 27 mmol/L (ref 22–32)
pCO2, Ven: 43.6 mm[Hg] — ABNORMAL LOW (ref 44–60)
pH, Ven: 7.385 (ref 7.25–7.43)
pO2, Ven: 37 mm[Hg] (ref 32–45)

## 2023-07-02 LAB — BASIC METABOLIC PANEL
Anion gap: 9 (ref 5–15)
Anion gap: 8 (ref 5–15)
BUN: 14 mg/dL (ref 6–20)
BUN: 11 mg/dL (ref 6–20)
CO2: 24 mmol/L (ref 22–32)
CO2: 21 mmol/L — ABNORMAL LOW (ref 22–32)
Calcium: 9 mg/dL (ref 8.9–10.3)
Calcium: 8.7 mg/dL — ABNORMAL LOW (ref 8.9–10.3)
Chloride: 100 mmol/L (ref 98–111)
Chloride: 102 mmol/L (ref 98–111)
Creatinine, Ser: 0.74 mg/dL (ref 0.61–1.24)
Creatinine, Ser: 0.6 mg/dL — ABNORMAL LOW (ref 0.61–1.24)
GFR, Estimated: >60 mL/min (ref >60)
GFR, Estimated: >60 mL/min (ref >60)
Glucose, Bld: 136 mg/dL — ABNORMAL HIGH (ref 70–99)
Glucose, Bld: 202 mg/dL — ABNORMAL HIGH (ref 70–99)
Potassium: 4.1 mmol/L (ref 3.5–5.1)
Potassium: 5.1 mmol/L (ref 3.5–5.1)
Sodium: 133 mmol/L — ABNORMAL LOW (ref 135–145)
Sodium: 131 mmol/L — ABNORMAL LOW (ref 135–145)

## 2023-07-02 LAB — PROTIME-INR
INR: 1.5 — ABNORMAL HIGH (ref 0.8–1.2)
INR: 1.4 — ABNORMAL HIGH (ref 0.8–1.2)
Prothrombin Time: 17.9 s — ABNORMAL HIGH (ref 11.4–15.2)
Prothrombin Time: 17.2 s — ABNORMAL HIGH (ref 11.4–15.2)

## 2023-07-02 LAB — APTT
aPTT: 36 s (ref 24–36)
aPTT: 28 s (ref 24–36)

## 2023-07-02 LAB — MAGNESIUM: Magnesium: 2.3 mg/dL (ref 1.7–2.4)

## 2023-07-02 LAB — HEMOGLOBIN AND HEMATOCRIT, BLOOD
HCT: 19.9 % — ABNORMAL LOW (ref 39.0–52.0)
Hemoglobin: 6.3 g/dL — CL (ref 13.0–17.0)

## 2023-07-02 LAB — GLUCOSE, CAPILLARY
Glucose-Capillary: 120 mg/dL — ABNORMAL HIGH (ref 70–99)
Glucose-Capillary: 140 mg/dL — ABNORMAL HIGH (ref 70–99)
Glucose-Capillary: 175 mg/dL — ABNORMAL HIGH (ref 70–99)
Glucose-Capillary: 190 mg/dL — ABNORMAL HIGH (ref 70–99)
Glucose-Capillary: 170 mg/dL — ABNORMAL HIGH (ref 70–99)
Glucose-Capillary: 155 mg/dL — ABNORMAL HIGH (ref 70–99)
Glucose-Capillary: 172 mg/dL — ABNORMAL HIGH (ref 70–99)

## 2023-07-02 LAB — FIBRINOGEN: Fibrinogen: 382 mg/dL (ref 210–475)

## 2023-07-02 SURGERY — EXPLORATION POST OPERATIVE OPEN HEART
Anesthesia: General | Site: Chest

## 2023-07-02 SURGERY — REPLACEMENT, MITRAL VALVE
Anesthesia: General | Site: Chest

## 2023-07-02 MED ORDER — CHLORHEXIDINE GLUCONATE 0.12 % MT SOLN
15.0000 mL | OROMUCOSAL | Status: AC
  Filled 2023-07-02: qty 15

## 2023-07-02 MED ORDER — ALBUMIN HUMAN 5 % IV SOLN
250.0000 mL | INTRAVENOUS | Status: AC | PRN
  Administered 2023-07-02 (×5): 12.5 g via INTRAVENOUS
  Filled 2023-07-02 (×3): qty 250

## 2023-07-02 MED ORDER — SODIUM CHLORIDE 0.9 % IV SOLN
INTRAVENOUS | Status: DC
  Filled 2023-07-02: qty 20

## 2023-07-02 MED ORDER — TRANEXAMIC ACID (OHS) PUMP PRIME SOLUTION
2.0000 mg/kg | INTRAVENOUS | Status: DC
  Filled 2023-07-02: qty 1.74

## 2023-07-02 MED ORDER — INSULIN REGULAR(HUMAN) IN NACL 100-0.9 UT/100ML-% IV SOLN
INTRAVENOUS | Status: DC
  Filled 2023-07-02: qty 100

## 2023-07-02 MED ORDER — DEXMEDETOMIDINE HCL IN NACL 400 MCG/100ML IV SOLN
0.1000 ug/kg/h | INTRAVENOUS | Status: DC
  Filled 2023-07-02: qty 100

## 2023-07-02 MED ORDER — LIDOCAINE HCL (CARDIAC) PF 100 MG/5ML IV SOSY
PREFILLED_SYRINGE | INTRAVENOUS | Status: DC | PRN
  Administered 2023-07-02: 100 mg via INTRATRACHEAL

## 2023-07-02 MED ORDER — SODIUM CHLORIDE 0.9 % IV SOLN: 250.0000 mL | INTRAVENOUS | Status: DC

## 2023-07-02 MED ORDER — CEFAZOLIN SODIUM-DEXTROSE 2-4 GM/100ML-% IV SOLN
2.0000 g | INTRAVENOUS | Status: DC
  Filled 2023-07-02: qty 100

## 2023-07-02 MED ORDER — PROTAMINE SULFATE 10 MG/ML IV SOLN
INTRAVENOUS | Status: AC
  Filled 2023-07-02: qty 25

## 2023-07-02 MED ORDER — SODIUM CHLORIDE 0.9 % IV SOLN: 10.0000 mL/h | Freq: Once | INTRAVENOUS | Status: DC

## 2023-07-02 MED ORDER — PROPOFOL 500 MG/50ML IV EMUL
INTRAVENOUS | Status: DC | PRN
  Administered 2023-07-02: 10 ug/kg/min via INTRAVENOUS

## 2023-07-02 MED ORDER — VANCOMYCIN HCL 1000 MG IV SOLR: INTRAVENOUS | Status: DC | PRN

## 2023-07-02 MED ORDER — CHLORHEXIDINE GLUCONATE 0.12 % MT SOLN: 15.0000 mL | Freq: Once | OROMUCOSAL | Status: DC

## 2023-07-02 MED ORDER — PROPOFOL 1000 MG/100ML IV EMUL
INTRAVENOUS | Status: AC
  Filled 2023-07-02: qty 100

## 2023-07-02 MED ORDER — PHENYLEPHRINE 80 MCG/ML (10ML) SYRINGE FOR IV PUSH (FOR BLOOD PRESSURE SUPPORT)
PREFILLED_SYRINGE | INTRAVENOUS | Status: DC | PRN
  Administered 2023-07-02: 40 ug via INTRAVENOUS
  Administered 2023-07-02: 160 ug via INTRAVENOUS
  Administered 2023-07-02 (×3): 80 ug via INTRAVENOUS

## 2023-07-02 MED ORDER — PROPOFOL 10 MG/ML IV BOLUS
INTRAVENOUS | Status: AC
  Filled 2023-07-02: qty 20

## 2023-07-02 MED ORDER — CISATRACURIUM BESYLATE (PF) 10 MG/5ML IV SOLN
INTRAVENOUS | Status: DC | PRN
  Administered 2023-07-02: 50 mg via INTRAVENOUS

## 2023-07-02 MED ORDER — MIDAZOLAM HCL 2 MG/2ML IJ SOLN
INTRAMUSCULAR | Status: AC
  Administered 2023-07-02: 2 mg
  Filled 2023-07-02: qty 2

## 2023-07-02 MED ORDER — FENTANYL CITRATE (PF) 250 MCG/5ML IJ SOLN
INTRAMUSCULAR | Status: AC
  Filled 2023-07-02: qty 5

## 2023-07-02 MED ORDER — CHLORHEXIDINE GLUCONATE CLOTH 2 % EX PADS
6.0000 | MEDICATED_PAD | Freq: Every day | CUTANEOUS | Status: DC
  Administered 2023-07-02 – 2023-07-14 (×12): 6 via TOPICAL

## 2023-07-02 MED ORDER — ROCURONIUM BROMIDE 10 MG/ML (PF) SYRINGE
PREFILLED_SYRINGE | INTRAVENOUS | Status: AC
  Filled 2023-07-02: qty 10

## 2023-07-02 MED ORDER — 0.9 % SODIUM CHLORIDE (POUR BTL) OPTIME
TOPICAL | Status: DC | PRN
  Administered 2023-07-02: 5000 mL

## 2023-07-02 MED ORDER — ONDANSETRON HCL 4 MG/2ML IJ SOLN: 4.0000 mg | Freq: Four times a day (QID) | INTRAMUSCULAR | Status: DC | PRN

## 2023-07-02 MED ORDER — DOCUSATE SODIUM 100 MG PO CAPS
200.0000 mg | ORAL_CAPSULE | Freq: Every day | ORAL | Status: DC
  Administered 2023-07-03 – 2023-07-14 (×11): 200 mg via ORAL
  Filled 2023-07-02 (×11): qty 2

## 2023-07-02 MED ORDER — METOCLOPRAMIDE HCL 5 MG/ML IJ SOLN
10.0000 mg | Freq: Four times a day (QID) | INTRAMUSCULAR | Status: AC
  Administered 2023-07-02 – 2023-07-03 (×5): 10 mg via INTRAVENOUS
  Filled 2023-07-02 (×5): qty 2

## 2023-07-02 MED ORDER — NITROGLYCERIN 0.2 MG/ML ON CALL CATH LAB
INTRAVENOUS | Status: DC | PRN
  Administered 2023-07-02 (×2): 20 ug via INTRAVENOUS

## 2023-07-02 MED ORDER — FENTANYL CITRATE (PF) 250 MCG/5ML IJ SOLN
INTRAMUSCULAR | Status: DC | PRN
  Administered 2023-07-02: 100 ug via INTRAVENOUS
  Administered 2023-07-02: 250 ug via INTRAVENOUS
  Administered 2023-07-02: 150 ug via INTRAVENOUS
  Administered 2023-07-02 (×2): 50 ug via INTRAVENOUS

## 2023-07-02 MED ORDER — ACETAMINOPHEN 500 MG PO TABS
1000.0000 mg | ORAL_TABLET | Freq: Four times a day (QID) | ORAL | Status: AC
  Administered 2023-07-03 – 2023-07-07 (×15): 1000 mg via ORAL
  Filled 2023-07-02 (×15): qty 2

## 2023-07-02 MED ORDER — ORAL CARE MOUTH RINSE: 15.0000 mL | Freq: Once | OROMUCOSAL | Status: DC

## 2023-07-02 MED ORDER — VANCOMYCIN HCL 1.5 G IV SOLR
1500.0000 mg | INTRAVENOUS | Status: DC
  Filled 2023-07-02: qty 30

## 2023-07-02 MED ORDER — HEPARIN SODIUM (PORCINE) 1000 UNIT/ML IJ SOLN
INTRAMUSCULAR | Status: DC | PRN
  Administered 2023-07-02: 30000 [IU] via INTRAVENOUS

## 2023-07-02 MED ORDER — NOREPINEPHRINE 4 MG/250ML-% IV SOLN: 0.0000 ug/min | INTRAVENOUS | Status: DC

## 2023-07-02 MED ORDER — DEXMEDETOMIDINE HCL IN NACL 400 MCG/100ML IV SOLN
0.0000 ug/kg/h | INTRAVENOUS | Status: DC
  Administered 2023-07-02 (×2): 0.7 ug/kg/h via INTRAVENOUS
  Filled 2023-07-02: qty 100

## 2023-07-02 MED ORDER — STERILE WATER FOR IRRIGATION IR SOLN
Status: DC | PRN
  Administered 2023-07-02: 2000 mL

## 2023-07-02 MED ORDER — OXYCODONE HCL 5 MG/5ML PO SOLN: 5.0000 mg | Freq: Once | ORAL | Status: DC | PRN

## 2023-07-02 MED ORDER — ACETAMINOPHEN 10 MG/ML IV SOLN: 1000.0000 mg | Freq: Once | INTRAVENOUS | Status: DC | PRN

## 2023-07-02 MED ORDER — NITROGLYCERIN IN D5W 200-5 MCG/ML-% IV SOLN: 0.0000 ug/min | INTRAVENOUS | Status: DC

## 2023-07-02 MED ORDER — BISACODYL 10 MG RE SUPP: 10.0000 mg | Freq: Every day | RECTAL | Status: DC

## 2023-07-02 MED ORDER — METOPROLOL TARTRATE 5 MG/5ML IV SOLN: 2.5000 mg | INTRAVENOUS | Status: DC | PRN

## 2023-07-02 MED ORDER — PROTAMINE SULFATE 10 MG/ML IV SOLN
INTRAVENOUS | Status: AC
  Filled 2023-07-02: qty 5

## 2023-07-02 MED ORDER — SODIUM CHLORIDE 0.9% IV SOLUTION: Freq: Once | INTRAVENOUS | Status: DC

## 2023-07-02 MED ORDER — PHENYLEPHRINE HCL-NACL 20-0.9 MG/250ML-% IV SOLN
30.0000 ug/min | INTRAVENOUS | Status: DC
  Filled 2023-07-02: qty 250

## 2023-07-02 MED ORDER — EPINEPHRINE HCL 5 MG/250ML IV SOLN IN NS
0.0000 ug/min | INTRAVENOUS | Status: AC
  Administered 2023-07-02: 3 ug/min via INTRAVENOUS
  Filled 2023-07-02: qty 250

## 2023-07-02 MED ORDER — BISACODYL 5 MG PO TBEC
10.0000 mg | DELAYED_RELEASE_TABLET | Freq: Every day | ORAL | Status: DC
  Administered 2023-07-03 – 2023-07-08 (×4): 10 mg via ORAL
  Filled 2023-07-02 (×6): qty 2

## 2023-07-02 MED ORDER — PHENYLEPHRINE HCL-NACL 20-0.9 MG/250ML-% IV SOLN: 0.0000 ug/min | INTRAVENOUS | Status: DC

## 2023-07-02 MED ORDER — POTASSIUM CHLORIDE 2 MEQ/ML IV SOLN
80.0000 meq | INTRAVENOUS | Status: DC
  Filled 2023-07-02: qty 40

## 2023-07-02 MED ORDER — ACETAMINOPHEN 160 MG/5ML PO SOLN
1000.0000 mg | Freq: Four times a day (QID) | ORAL | Status: AC
  Administered 2023-07-03: 1000 mg
  Filled 2023-07-02: qty 40.6

## 2023-07-02 MED ORDER — HEPARIN SODIUM (PORCINE) 1000 UNIT/ML IJ SOLN
INTRAMUSCULAR | Status: AC
  Filled 2023-07-02: qty 1

## 2023-07-02 MED ORDER — LACTATED RINGERS IV SOLN: INTRAVENOUS | Status: AC

## 2023-07-02 MED ORDER — TRANEXAMIC ACID (OHS) BOLUS VIA INFUSION
15.0000 mg/kg | INTRAVENOUS | Status: DC
  Filled 2023-07-02: qty 1305

## 2023-07-02 MED ORDER — ROCURONIUM BROMIDE 10 MG/ML (PF) SYRINGE
PREFILLED_SYRINGE | INTRAVENOUS | Status: DC | PRN
  Administered 2023-07-02: 100 mg via INTRAVENOUS
  Administered 2023-07-02 (×2): 50 mg via INTRAVENOUS

## 2023-07-02 MED ORDER — EPINEPHRINE HCL 5 MG/250ML IV SOLN IN NS: 0.5000 ug/min | INTRAVENOUS | Status: DC

## 2023-07-02 MED ORDER — LACTATED RINGERS IV SOLN: INTRAVENOUS | Status: DC

## 2023-07-02 MED ORDER — CALCIUM CHLORIDE 10 % IV SOLN
INTRAVENOUS | Status: DC | PRN
  Administered 2023-07-02: 200 mg via INTRAVENOUS

## 2023-07-02 MED ORDER — DEXTROSE 50 % IV SOLN: 0.0000 mL | INTRAVENOUS | Status: DC | PRN

## 2023-07-02 MED ORDER — CEFAZOLIN SODIUM-DEXTROSE 2-4 GM/100ML-% IV SOLN
2.0000 g | Freq: Three times a day (TID) | INTRAVENOUS | Status: AC
  Administered 2023-07-02 – 2023-07-04 (×5): 2 g via INTRAVENOUS
  Filled 2023-07-02 (×6): qty 100

## 2023-07-02 MED ORDER — TRANEXAMIC ACID 1000 MG/10ML IV SOLN
1.5000 mg/kg/h | INTRAVENOUS | Status: AC
  Administered 2023-07-02: 1.5 mg/kg/h via INTRAVENOUS
  Filled 2023-07-02: qty 25

## 2023-07-02 MED ORDER — ASPIRIN 81 MG PO CHEW: 324.0000 mg | CHEWABLE_TABLET | Freq: Once | ORAL | Status: DC

## 2023-07-02 MED ORDER — METOPROLOL TARTRATE 12.5 MG HALF TABLET
12.5000 mg | ORAL_TABLET | Freq: Two times a day (BID) | ORAL | Status: DC
  Administered 2023-07-03 – 2023-07-04 (×4): 12.5 mg via ORAL
  Filled 2023-07-02 (×4): qty 1

## 2023-07-02 MED ORDER — ASPIRIN 325 MG PO TBEC
325.0000 mg | DELAYED_RELEASE_TABLET | Freq: Every day | ORAL | Status: DC
  Administered 2023-07-03: 325 mg via ORAL
  Filled 2023-07-02: qty 1

## 2023-07-02 MED ORDER — NOREPINEPHRINE 4 MG/250ML-% IV SOLN
0.0000 ug/min | INTRAVENOUS | Status: DC
  Filled 2023-07-02: qty 250

## 2023-07-02 MED ORDER — LACTATED RINGERS IV SOLN: INTRAVENOUS | Status: DC | PRN

## 2023-07-02 MED ORDER — SODIUM CHLORIDE 0.9 % IV SOLN: INTRAVENOUS | Status: AC

## 2023-07-02 MED ORDER — OXYCODONE HCL 5 MG PO TABS
5.0000 mg | ORAL_TABLET | ORAL | Status: DC | PRN
  Administered 2023-07-03 – 2023-07-05 (×8): 10 mg via ORAL
  Administered 2023-07-06: 5 mg via ORAL
  Administered 2023-07-08 (×2): 10 mg via ORAL
  Filled 2023-07-02 (×11): qty 2

## 2023-07-02 MED ORDER — SUGAMMADEX SODIUM 200 MG/2ML IV SOLN
INTRAVENOUS | Status: DC | PRN
  Administered 2023-07-02: 150 mg via INTRAVENOUS

## 2023-07-02 MED ORDER — TRAMADOL HCL 50 MG PO TABS
50.0000 mg | ORAL_TABLET | ORAL | Status: DC | PRN
  Administered 2023-07-04 – 2023-07-05 (×2): 50 mg via ORAL
  Administered 2023-07-07 – 2023-07-08 (×3): 100 mg via ORAL
  Filled 2023-07-02 (×2): qty 2
  Filled 2023-07-02: qty 1
  Filled 2023-07-02: qty 2
  Filled 2023-07-02: qty 1

## 2023-07-02 MED ORDER — NOREPINEPHRINE BITARTRATE 1 MG/ML IV SOLN
INTRAVENOUS | Status: DC | PRN
  Administered 2023-07-02 (×3): 1 mL via INTRAVENOUS

## 2023-07-02 MED ORDER — FAMOTIDINE 20 MG PO TABS: 20.0000 mg | ORAL_TABLET | Freq: Two times a day (BID) | ORAL | Status: DC

## 2023-07-02 MED ORDER — PANTOPRAZOLE SODIUM 40 MG IV SOLR
40.0000 mg | Freq: Every day | INTRAVENOUS | Status: AC
  Administered 2023-07-02 – 2023-07-03 (×2): 40 mg via INTRAVENOUS
  Filled 2023-07-02 (×2): qty 10

## 2023-07-02 MED ORDER — MIDAZOLAM HCL (PF) 5 MG/ML IJ SOLN
INTRAMUSCULAR | Status: DC | PRN
  Administered 2023-07-02 (×2): 1 mg via INTRAVENOUS

## 2023-07-02 MED ORDER — MANNITOL 20 % IV SOLN
INTRAVENOUS | Status: DC
  Filled 2023-07-02: qty 13

## 2023-07-02 MED ORDER — VANCOMYCIN HCL 1000 MG IV SOLR
INTRAVENOUS | Status: DC | PRN
  Administered 2023-07-02: 1000 mg

## 2023-07-02 MED ORDER — SODIUM CHLORIDE 0.9% FLUSH
3.0000 mL | Freq: Two times a day (BID) | INTRAVENOUS | Status: DC
  Administered 2023-07-03 – 2023-07-13 (×14): 3 mL via INTRAVENOUS

## 2023-07-02 MED ORDER — LIDOCAINE 2% (20 MG/ML) 5 ML SYRINGE
INTRAMUSCULAR | Status: AC
  Filled 2023-07-02: qty 5

## 2023-07-02 MED ORDER — FENTANYL CITRATE (PF) 250 MCG/5ML IJ SOLN
INTRAMUSCULAR | Status: DC | PRN
  Administered 2023-07-02 (×5): 50 ug via INTRAVENOUS

## 2023-07-02 MED ORDER — PROPOFOL 1000 MG/100ML IV EMUL
5.0000 ug/kg/min | INTRAVENOUS | Status: DC
  Administered 2023-07-02: 50 ug/kg/min via INTRAVENOUS
  Administered 2023-07-03: 40 ug/kg/min via INTRAVENOUS
  Filled 2023-07-02 (×2): qty 100

## 2023-07-02 MED ORDER — HEPARIN 30,000 UNITS/1000 ML (OHS) CELLSAVER SOLUTION
Status: DC
  Filled 2023-07-02: qty 1000

## 2023-07-02 MED ORDER — VASOPRESSIN 20 UNIT/ML IV SOLN
INTRAVENOUS | Status: AC
Start: 2023-07-02 — End: ?
  Filled 2023-07-02: qty 1

## 2023-07-02 MED ORDER — ALBUMIN HUMAN 5 % IV SOLN: INTRAVENOUS | Status: DC | PRN

## 2023-07-02 MED ORDER — METOPROLOL TARTRATE 25 MG/10 ML ORAL SUSPENSION: 12.5000 mg | Freq: Two times a day (BID) | ORAL | Status: DC

## 2023-07-02 MED ORDER — CALCIUM CHLORIDE 10 % IV SOLN
INTRAVENOUS | Status: AC
  Filled 2023-07-02: qty 10

## 2023-07-02 MED ORDER — PLASMA-LYTE A IV SOLN: INTRAVENOUS | Status: DC | PRN

## 2023-07-02 MED ORDER — FENTANYL CITRATE (PF) 100 MCG/2ML IJ SOLN: 25.0000 ug | INTRAMUSCULAR | Status: DC | PRN

## 2023-07-02 MED ORDER — NITROGLYCERIN IN D5W 200-5 MCG/ML-% IV SOLN
2.0000 ug/min | INTRAVENOUS | Status: DC
  Filled 2023-07-02: qty 250

## 2023-07-02 MED ORDER — MAGNESIUM SULFATE 4 GM/100ML IV SOLN
4.0000 g | Freq: Once | INTRAVENOUS | Status: AC
  Administered 2023-07-02: 4 g via INTRAVENOUS
  Filled 2023-07-02: qty 100

## 2023-07-02 MED ORDER — PLASMA-LYTE A IV SOLN
INTRAVENOUS | Status: DC
  Filled 2023-07-02 (×2): qty 2.5

## 2023-07-02 MED ORDER — VASOPRESSIN 20 UNIT/ML IV SOLN
INTRAVENOUS | Status: DC | PRN
  Administered 2023-07-02 (×6): 1 [IU] via INTRAVENOUS

## 2023-07-02 MED ORDER — VANCOMYCIN HCL 1000 MG IV SOLR
INTRAVENOUS | Status: AC
  Filled 2023-07-02: qty 20

## 2023-07-02 MED ORDER — MORPHINE SULFATE (PF) 2 MG/ML IV SOLN
1.0000 mg | INTRAVENOUS | Status: DC | PRN
  Administered 2023-07-02 – 2023-07-03 (×4): 4 mg via INTRAVENOUS
  Filled 2023-07-02: qty 2
  Filled 2023-07-02: qty 1
  Filled 2023-07-02 (×3): qty 2

## 2023-07-02 MED ORDER — MIDAZOLAM HCL (PF) 10 MG/2ML IJ SOLN
INTRAMUSCULAR | Status: AC
  Filled 2023-07-02: qty 2

## 2023-07-02 MED ORDER — SODIUM CHLORIDE 0.45 % IV SOLN
INTRAVENOUS | Status: AC | PRN
Start: 2023-07-02 — End: 2023-07-03

## 2023-07-02 MED ORDER — PROTAMINE SULFATE 10 MG/ML IV SOLN
50.0000 mg | Freq: Once | INTRAVENOUS | Status: AC
  Administered 2023-07-02: 50 mg via INTRAVENOUS
  Filled 2023-07-02: qty 5

## 2023-07-02 MED ORDER — POTASSIUM CHLORIDE 10 MEQ/50ML IV SOLN: 10.0000 meq | INTRAVENOUS | Status: AC

## 2023-07-02 MED ORDER — ONDANSETRON HCL 4 MG/2ML IJ SOLN: 4.0000 mg | Freq: Once | INTRAMUSCULAR | Status: DC | PRN

## 2023-07-02 MED ORDER — OXYCODONE HCL 5 MG PO TABS: 5.0000 mg | ORAL_TABLET | Freq: Once | ORAL | Status: DC | PRN

## 2023-07-02 MED ORDER — INSULIN REGULAR(HUMAN) IN NACL 100-0.9 UT/100ML-% IV SOLN: INTRAVENOUS | Status: DC

## 2023-07-02 MED ORDER — HEMOSTATIC AGENTS (NO CHARGE) OPTIME
TOPICAL | Status: DC | PRN
  Administered 2023-07-02 (×2): 1 via TOPICAL

## 2023-07-02 MED ORDER — SODIUM CHLORIDE 0.9 % IV SOLN: INTRAVENOUS | Status: DC | PRN

## 2023-07-02 MED ORDER — ACETAMINOPHEN 160 MG/5ML PO SOLN: 650.0000 mg | Freq: Once | ORAL | Status: DC

## 2023-07-02 MED ORDER — PROTAMINE SULFATE 10 MG/ML IV SOLN
INTRAVENOUS | Status: DC | PRN
  Administered 2023-07-02: 290 mg via INTRAVENOUS
  Administered 2023-07-02: 10 mg via INTRAVENOUS

## 2023-07-02 MED ORDER — MIDAZOLAM HCL 2 MG/2ML IJ SOLN
2.0000 mg | INTRAMUSCULAR | Status: DC | PRN
  Administered 2023-07-03: 2 mg via INTRAVENOUS
  Filled 2023-07-02: qty 2

## 2023-07-02 MED ORDER — ASPIRIN 81 MG PO CHEW: 324.0000 mg | CHEWABLE_TABLET | Freq: Every day | ORAL | Status: DC

## 2023-07-02 MED ORDER — MILRINONE LACTATE IN DEXTROSE 20-5 MG/100ML-% IV SOLN
0.3000 ug/kg/min | INTRAVENOUS | Status: DC
  Filled 2023-07-02: qty 100

## 2023-07-02 MED ORDER — PANTOPRAZOLE SODIUM 40 MG PO TBEC
40.0000 mg | DELAYED_RELEASE_TABLET | Freq: Every day | ORAL | Status: DC
  Administered 2023-07-04 – 2023-07-14 (×11): 40 mg via ORAL
  Filled 2023-07-02 (×11): qty 1

## 2023-07-02 MED ORDER — SODIUM CHLORIDE 0.9% FLUSH
3.0000 mL | INTRAVENOUS | Status: DC | PRN
Start: 2023-07-03 — End: 2023-07-14
  Administered 2023-07-09: 3 mL via INTRAVENOUS

## 2023-07-02 MED ORDER — VANCOMYCIN HCL IN DEXTROSE 1-5 GM/200ML-% IV SOLN
1000.0000 mg | Freq: Once | INTRAVENOUS | Status: AC
  Administered 2023-07-02: 1000 mg via INTRAVENOUS
  Filled 2023-07-02: qty 200

## 2023-07-02 MED ORDER — CEFAZOLIN SODIUM-DEXTROSE 2-3 GM-%(50ML) IV SOLR
INTRAVENOUS | Status: DC | PRN
  Administered 2023-07-02: 2 g via INTRAVENOUS

## 2023-07-02 MED ORDER — PROPOFOL 10 MG/ML IV BOLUS
INTRAVENOUS | Status: DC | PRN
  Administered 2023-07-02: 50 mg via INTRAVENOUS
  Administered 2023-07-02: 30 mg via INTRAVENOUS

## 2023-07-02 SURGICAL SUPPLY — 75 items
ADAPTER CARDIO PERF ANTE/RETRO (ADAPTER) IMPLANT
ANTIFOG SOL W/FOAM PAD STRL (MISCELLANEOUS) ×2 IMPLANT
BAG DECANTER FOR FLEXI CONT (MISCELLANEOUS) ×3 IMPLANT
BLADE CLIPPER SURG (BLADE) ×3 IMPLANT
BLADE STERNUM SYSTEM 6 (BLADE) ×3 IMPLANT
BLADE SURG 11 STRL SS (BLADE) IMPLANT
CANISTER SUCT 3000ML PPV (MISCELLANEOUS) ×3 IMPLANT
CANN PRFSN 3/8XCNCT ST RT ANG (MISCELLANEOUS) ×2 IMPLANT
CANNULA NON VENT 20FR 12 (CANNULA) ×3 IMPLANT
CANNULA NON VENT 22FR 12 (CANNULA) ×3 IMPLANT
CANNULA PRFSN 3/8XCNCT RT ANG (MISCELLANEOUS) IMPLANT
CANNULA SUMP PERICARDIAL (CANNULA) ×3 IMPLANT
CANNULA VRC MALB SNGL STG 36FR (MISCELLANEOUS) IMPLANT
CATH HEART VENT LEFT (CATHETERS) ×3 IMPLANT
CATH RETROPLEGIA CORONARY 14FR (CATHETERS) IMPLANT
CATH ROBINSON RED A/P 18FR (CATHETERS) ×9 IMPLANT
CATH THOR STR 32F SOFT 20 RADI (CATHETERS) ×3 IMPLANT
CATH THORACIC 28FR RT ANG (CATHETERS) ×3 IMPLANT
CIRCUIT VACPAC SAFETY (MISCELLANEOUS) IMPLANT
CLAMP SUTURE YELLOW 5 PAIRS (MISCELLANEOUS) ×3 IMPLANT
CLIP TI MEDIUM 24 (CLIP) IMPLANT
CLIP TI WIDE RED SMALL 24 (CLIP) IMPLANT
CONNECTOR 1/2X3/8X1/2 3WAY (MISCELLANEOUS) IMPLANT
DEVICE SUT CK QUICK LOAD MINI (Prosthesis & Implant Heart) IMPLANT
DRAPE CV SPLIT W-CLR ANES SCRN (DRAPES) ×3 IMPLANT
DRAPE INCISE IOBAN 66X45 STRL (DRAPES) ×3 IMPLANT
DRAPE PERI GROIN 82X75IN TIB (DRAPES) ×3 IMPLANT
DRSG AQUACEL AG ADV 3.5X10 (GAUZE/BANDAGES/DRESSINGS) ×3 IMPLANT
ELECT CAUTERY BLADE 6.4 (BLADE) ×3 IMPLANT
ELECT REM PT RETURN 9FT ADLT (ELECTROSURGICAL) ×4 IMPLANT
ELECTRODE REM PT RTRN 9FT ADLT (ELECTROSURGICAL) ×6 IMPLANT
FELT TEFLON 1X6 (MISCELLANEOUS) ×3 IMPLANT
GAUZE SPONGE 4X4 12PLY STRL (GAUZE/BANDAGES/DRESSINGS) ×3 IMPLANT
GOWN STRL REUS W/ TWL LRG LVL3 (GOWN DISPOSABLE) ×18 IMPLANT
HEMOSTAT SURGICEL 2X14 (HEMOSTASIS) IMPLANT
INSERT FOGARTY XLG (MISCELLANEOUS) ×3 IMPLANT
KIT BASIN OR (CUSTOM PROCEDURE TRAY) ×3 IMPLANT
KIT SUT CK MINI COMBO 4X17 (Prosthesis & Implant Heart) IMPLANT
KIT TURNOVER KIT B (KITS) ×3 IMPLANT
LINE VENT (MISCELLANEOUS) IMPLANT
NS IRRIG 1000ML POUR BTL (IV SOLUTION) ×18 IMPLANT
ORGANIZER SUTURE GABBAY-FRATER (MISCELLANEOUS) ×3 IMPLANT
PACK E OPEN HEART (SUTURE) ×3 IMPLANT
PACK OPEN HEART (CUSTOM PROCEDURE TRAY) ×3 IMPLANT
PAD ARMBOARD 7.5X6 YLW CONV (MISCELLANEOUS) ×6 IMPLANT
PAD ELECT DEFIB RADIOL ZOLL (MISCELLANEOUS) ×3 IMPLANT
PENCIL BUTTON HOLSTER BLD 10FT (ELECTRODE) ×3 IMPLANT
POSITIONER HEAD DONUT 9IN (MISCELLANEOUS) ×3 IMPLANT
SET MPS 3-ND DEL (MISCELLANEOUS) IMPLANT
SOLUTION ANTFG W/FOAM PAD STRL (MISCELLANEOUS) ×3 IMPLANT
SUT ETHIBOND 2 0 SH (SUTURE) IMPLANT
SUT ETHIBOND 3 0 SH 1 (SUTURE) IMPLANT
SUT MNCRL AB 4-0 PS2 18 (SUTURE) ×6 IMPLANT
SUT PROLENE 4 0 SH DA (SUTURE) ×9 IMPLANT
SUT PROLENE 4-0 RB1 .5 CRCL 36 (SUTURE) ×6 IMPLANT
SUT PROLENE 5 0 RB 2 (SUTURE) IMPLANT
SUT STEEL 6MS V (SUTURE) ×3 IMPLANT
SUT STEEL SZ 6 DBL 3X14 BALL (SUTURE) ×6 IMPLANT
SUT VIC AB 0 CTX36XBRD ANTBCTR (SUTURE) ×6 IMPLANT
SUT VIC AB 2-0 CT1 TAPERPNT 27 (SUTURE) ×6 IMPLANT
SYR BULB IRRIG 60ML STRL (SYRINGE) IMPLANT
SYSTEM SAHARA CHEST DRAIN ATS (WOUND CARE) ×3 IMPLANT
TAG SUTURE CLAMP YLW 5PR (MISCELLANEOUS) ×2 IMPLANT
TAPE PAPER 2X10 WHT MICROPORE (GAUZE/BANDAGES/DRESSINGS) IMPLANT
TAPE PAPER 3X10 WHT MICROPORE (GAUZE/BANDAGES/DRESSINGS) IMPLANT
TOWEL GREEN STERILE (TOWEL DISPOSABLE) ×3 IMPLANT
TOWEL GREEN STERILE FF (TOWEL DISPOSABLE) ×3 IMPLANT
TRAY FOLEY SLVR 16FR TEMP STAT (SET/KITS/TRAYS/PACK) ×3 IMPLANT
TUBE CONNECTING 12X1/4 (SUCTIONS) IMPLANT
TUBE CONNECTING 20X1/4 (TUBING) IMPLANT
UNDERPAD 30X36 HEAVY ABSORB (UNDERPADS AND DIAPERS) ×3 IMPLANT
VALVE ST JUDE MITRAL 29X24.2 (Prosthesis & Implant Heart) IMPLANT
VENT LEFT HEART 12002 (CATHETERS) ×2 IMPLANT
VRC MALLEABLE SINGLE STG 36FR (MISCELLANEOUS) ×2 IMPLANT
WATER STERILE IRR 1000ML POUR (IV SOLUTION) ×6 IMPLANT

## 2023-07-02 SURGICAL SUPPLY — 44 items
BAG DECANTER FOR FLEXI CONT (MISCELLANEOUS) ×2 IMPLANT
BLADE CLIPPER SURG (BLADE) ×2 IMPLANT
CANISTER SUCT 3000ML PPV (MISCELLANEOUS) ×2 IMPLANT
DRAPE CARDIOVASC SPLIT 88X140 (DRAPES) ×2 IMPLANT
DRAPE PERI GROIN 82X75IN TIB (DRAPES) ×2 IMPLANT
DRESSING AQUACEL AG SP 3.5X10 (GAUZE/BANDAGES/DRESSINGS) IMPLANT
DRSG AQUACEL AG ADV 3.5X10 (GAUZE/BANDAGES/DRESSINGS) IMPLANT
DRSG AQUACEL AG SP 3.5X10 (GAUZE/BANDAGES/DRESSINGS) ×1 IMPLANT
ELECT BLADE 4.0 EZ CLEAN MEGAD (MISCELLANEOUS) ×1 IMPLANT
ELECT REM PT RETURN 9FT ADLT (ELECTROSURGICAL) ×2 IMPLANT
ELECTRODE BLDE 4.0 EZ CLN MEGD (MISCELLANEOUS) IMPLANT
ELECTRODE REM PT RTRN 9FT ADLT (ELECTROSURGICAL) ×4 IMPLANT
GAUZE SPONGE 4X4 12PLY STRL (GAUZE/BANDAGES/DRESSINGS) ×2 IMPLANT
GLOVE ECLIPSE 7.5 STRL STRAW (GLOVE) ×4 IMPLANT
GOWN STRL REUS W/ TWL LRG LVL3 (GOWN DISPOSABLE) ×6 IMPLANT
GOWN STRL REUS W/ TWL XL LVL3 (GOWN DISPOSABLE) ×4 IMPLANT
HEMOSTAT SURGICEL 2X14 (HEMOSTASIS) IMPLANT
KIT BASIN OR (CUSTOM PROCEDURE TRAY) ×2 IMPLANT
KIT SUCTION CATH 14FR (SUCTIONS) IMPLANT
KIT TURNOVER KIT B (KITS) ×2 IMPLANT
NS IRRIG 1000ML POUR BTL (IV SOLUTION) ×10 IMPLANT
PACK E OPEN HEART (SUTURE) IMPLANT
PAD ARMBOARD 7.5X6 YLW CONV (MISCELLANEOUS) ×4 IMPLANT
PAD ELECT DEFIB RADIOL ZOLL (MISCELLANEOUS) ×2 IMPLANT
POSITIONER HEAD DONUT 9IN (MISCELLANEOUS) ×2 IMPLANT
SPONGE T-LAP 18X18 ~~LOC~~+RFID (SPONGE) IMPLANT
SUT MNCRL AB 4-0 PS2 18 (SUTURE) IMPLANT
SUT PERMA SILK 0 CT1 (SUTURE) IMPLANT
SUT PROLENE 4 0 SH DA (SUTURE) IMPLANT
SUT PROLENE 4-0 RB1 .5 CRCL 36 (SUTURE) IMPLANT
SUT PROLENE 5 0 C 1 36 (SUTURE) IMPLANT
SUT STEEL 6MS V (SUTURE) ×2 IMPLANT
SUT STEEL SZ 6 DBL 3X14 BALL (SUTURE) ×4 IMPLANT
SUT VIC AB 0 CTX36XBRD ANTBCTR (SUTURE) IMPLANT
SUT VIC AB 1 CTX36XBRD ANBCTR (SUTURE) ×4 IMPLANT
SUT VIC AB 2-0 CT1 36 (SUTURE) ×4 IMPLANT
SUT VIC AB 2-0 CT1 TAPERPNT 27 (SUTURE) IMPLANT
SYSTEM SAHARA CHEST DRAIN ATS (WOUND CARE) ×2 IMPLANT
TAPE CLOTH SOFT 2X10 (GAUZE/BANDAGES/DRESSINGS) IMPLANT
TOWEL GREEN STERILE (TOWEL DISPOSABLE) ×2 IMPLANT
TUBE CONNECTING 20X1/4 (TUBING) IMPLANT
UNDERPAD 30X36 HEAVY ABSORB (UNDERPADS AND DIAPERS) ×2 IMPLANT
WATER STERILE IRR 1000ML POUR (IV SOLUTION) ×4 IMPLANT
YANKAUER SUCT BULB TIP NO VENT (SUCTIONS) IMPLANT

## 2023-07-02 NOTE — Plan of Care (Signed)

## 2023-07-02 NOTE — Consult Note (Addendum)
NAME:  Victor [name], MRN:  [id number], DOB:  [birth year], LOS: 14 ADMISSION DATE:  06/17/2023 CONSULTATION DATE:  07/02/2023 REFERRING MD:  Leafy Ro - TCTS CHIEF COMPLAINT:  Postoperative vent management   History of Present Illness:  Victor [name] is seen in consultation at the request of Dr. Leafy Ro (TCTS) for management of mechanical ventilation and hemodynamics post-MVR.  52 year old man who initially presented to [facility] 11/25 for SOB, orthopnea, BLE edema and back pain x 2 weeks.  PMHx significant for HTN, HLD, nonobstructive CAD, diastolic HFpEF (Echo 05/2023 with EF 65-70%, G2DD, +SAM with subaortic membrane of LVOT, degenerative MV with calcified anterior/posterior leaflets), obesity, EtOH use, low back pain. 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 [facility] 06/2023 demonstrated nonobstructive CAD, severely elevated L heart and PA pressures, normal CO/CI; possible HOCM. cMRI 06/2023 showed small pericardial effusion, suspected LV outflow gradient, +SAM, moderate to severe MR, noncoronary LGE c/w HOCM/infiltrative disease.  Underwent MVR 12/10 with Dr. 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   PCCM consulted for post-operative ventilator management.  Pertinent Medical History:   Past Medical History:  Diagnosis Date   CAD (coronary artery disease)    Nonobstructive on [facility] 06/2023   Dizziness 10/15/2013    "SWIMMY HEAD    "   HOCM (hypertrophic obstructive cardiomyopathy) (HCC)    Hypertension    Hypertriglyceridemia    Obesity    Severe mitral regurgitation    Significant Hospital Events: Including procedures, antibiotic start and stop dates in addition to other pertinent events   12/10 - MVR. CCM consulted for MV/hemodynamics.  Interim History / Subjective:  PCCM consulted for vent management  post-MVR.  Objective:  Blood pressure (!) 147/70, pulse 79, temperature 98.1 F (36.7 C), temperature source Oral, resp. rate (!) 22, height 5\' 7"  (1.702 m), weight 87 kg, SpO2 99%.    Vent Mode: SIMV;PSV;PRVC FiO2 (%):  [50 %] 50 % Set Rate:  [16 bmp-21 bmp] 21 bmp Vt Set:  [520 mL] 520 mL PEEP:  [5 cmH20-10 cmH20] 10 cmH20 Pressure Support:  [10 cmH20] 10 cmH20 Plateau Pressure:  [16 cmH20] 16 cmH20   Intake/Output Summary (Last 24 hours) at 07/02/2023 1325 Last data filed at 07/02/2023 1300 Gross per 24 hour  Intake 3007 ml  Output 5040 ml  Net -2033 ml   Filed Weights   06/30/23 0507 07/01/23 0418 07/02/23 0340  Weight: 87.2 kg 87.6 kg 87 kg   Physical Examination: General: Acutely ill-appearing middle-aged man in NAD. HEENT: [place]/AT, anicteric sclera, PERRL 3mm sluggish, moist mucous membranes. Neuro: Intubated, sedated. Responds to noxious stimuli (in-line suctioning, oral suctioning). Not following commands. No spontaneous movement of extremities noted on my exam. +Corneal, +Cough, and +Gag  CV: RRR, paced (VVI) no m/g/r. Midline sternotomy with dressing clean/dry/intact. CT x 3 with sanguinous output. PULM: Breathing even and unlabored on vent (PEEP 10, FIO2 50%, SIMV). Lung fields with coarse upper airway sounds. GI: Soft, nontender, nondistended. Normoactive bowel sounds. Extremities: Trace bilateral LE edema noted. Skin: Warm/dry, no rashes.  Resolved Hospital Problem List:    Assessment & Plan:  Victor [name] is seen in consultation at the request of Dr. Leafy Ro (TCTS) for management of mechanical ventilation and hemodynamics post-MVR.  52 year old man who initially presented to [facility] 11/25 for SOB, orthopnea, BLE edema and back pain x 2 weeks. Found to have severe MR with MV  endocarditis.  Underwent MVR 12/10 with Dr. 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 Intraoperative course was notable for extreme LV hypertrophy and severe,  rheumatic-appearing MR with healed vegetations and subaortic membrane.    Postoperative vent management - Continue full vent support (4-8cc/kg IBW) - Wean FiO2 for O2 sat > 90% - Daily WUA/SBT, rapid wean with SIMV per protocol - VAP bundle - Pulmonary hygiene - F/u ABG - PAD protocol for sedation: Precedex and Propofol for goal RASS 0 to -1, PRN Fentanyl/Versed - Follow CXR, ABG  S/p MVR with St. Jude mechanical prosthesis (29mm) Severe MR with endocarditis, now s/p MVR Acute-on-chronic HFpEF ?HOCM Nonobstructive CAD Echo 05/2023 with EF 65-70%, G2DD, +SAM with subaortic membrane of LVOT, degenerative MV with calcified anterior/posterior leaflets. [facility] 06/2023 +nonobstructive CAD, severely elevated L heart and PA pressures, normal CO/CI; possible HOCM. cMRI 06/2023 showed small pericardial effusion, suspected LV outflow gradient, +SAM, moderate to severe MR, noncoronary LGE c/w HOCM/infiltrative disease. - Monitor CI, CO via Swan, will likely need higher filling pressures in the setting of HOCM/severe LVH - Levophed titrated to goal MAP/SBP - Albumin administration per protocol - ASA, resume statin as appropriate - Postoperative care per TCTS - Multimodal pain control/PAD protocol - CT management per protocol - Continue Pen G  ABLA in the setting of cardiac surgery - Trend H&H, Plt - Monitor for signs of active bleeding - Transfuse for Hgb < 7.0, Plt < 20 or hemodynamically significant bleeding (per TCTS) - Protamine x 1 ordered for heparin reversal, given copious CT output first 1 hour post-op  EtOH use, daily - Monitor for signs/symptoms of withdrawal - Precedex, wean as able - Consider CIWA protocol - Thiamine/folate - MV when able to tolerate PO  Best Practice: (right click and "Reselect all SmartList Selections" daily)   Diet/type: NPO DVT prophylaxis: SCDs GI prophylaxis: PPI Lines: Central line and Arterial Line Foley:  Yes, and it is still needed Code Status:   full code Last date of multidisciplinary goals of care discussion [Per TCTS]  Labs:  CBC: Recent Labs  Lab 06/28/23 0227 06/29/23 0217 06/30/23 0310 07/01/23 0236 07/02/23 0227 07/02/23 0755 07/02/23 1006 07/02/23 1031 07/02/23 1113 07/02/23 1120 07/02/23 1244  WBC 9.8 9.6 8.6 8.0 7.5  --   --   --   --   --  22.6*  NEUTROABS 6.9 6.6 5.5 4.8 4.7  --   --   --   --   --   --   HGB 8.3* 8.0* 8.0* 7.8* 7.7*   < > 7.8* 6.3* 7.8* 7.8* 7.7*  HCT 26.9* 26.1* 25.7* 24.9* 24.6*   < > 23.0* 19.9* 23.0* 23.0* 24.2*  MCV 92.4 93.2 92.8 91.9 91.4  --   --   --   --   --  92.0  PLT 242 234 218 218 200  --   --  171  --   --  143*   < > = values in this interval not displayed.   Basic Metabolic Panel: Recent Labs  Lab 06/26/23 0226 06/27/23 0218 06/28/23 0227 06/29/23 0217 06/30/23 0310 07/01/23 0236 07/02/23 0227 07/02/23 0755 07/02/23 0835 07/02/23 0853 07/02/23 0859 07/02/23 0937 07/02/23 1006 07/02/23 1113 07/02/23 1120  NA 133*   < > 132* 133* 133* 132* 133* 138 134*   < > 136 135 134* 134* 133*  K 4.2   < > 4.1 4.0 3.9 4.0 4.1 3.9 4.3   < > 4.1 4.9 4.6  4.8 4.8  CL 103   < > 99 102 100 99 100 101 99  --   --  95* 96*  --  100  CO2 23   < > 23 21* 22 23 24   --   --   --   --   --   --   --   --   GLUCOSE 122*   < > 117* 128* 122* 122* 136* 106* 124*  --   --  213* 221*  --  199*  BUN 13   < > 11 12 11 11 14 12 14   --   --  13 14  --  14  CREATININE 0.75   < > 0.68 0.64 0.67 0.71 0.74 0.50* 0.60*  --   --  0.60* 0.60*  --  0.70  CALCIUM 8.6*   < > 9.3 9.0 8.9 8.6* 9.0  --   --   --   --   --   --   --   --   MG 1.9  --   --   --   --   --   --   --   --   --   --   --   --   --   --   PHOS 4.1  --   --   --   --   --   --   --   --   --   --   --   --   --   --    < > = values in this interval not displayed.   GFR: Estimated Creatinine Clearance: 111.2 mL/min (by C-G formula based on SCr of 0.7 mg/dL). Recent Labs  Lab 06/30/23 0310 07/01/23 0236 07/02/23 0227  07/02/23 1244  WBC 8.6 8.0 7.5 22.6*   Liver Function Tests: Recent Labs  Lab 06/25/23 1858 06/26/23 0226 06/27/23 0218 06/28/23 0227 06/29/23 0217  AST 139* 129* 98* 67* 34  ALT 111* 113* 109* 91* 63*  ALKPHOS 72 68 67 77 69  BILITOT 0.6 0.4 0.6 0.6 0.4  PROT 7.8 7.2 7.1 7.2 7.1  ALBUMIN 2.7* 2.4* 2.5* 2.6* 2.4*   No results for input(s): "LIPASE", "AMYLASE" in the last 168 hours. No results for input(s): "AMMONIA" in the last 168 hours.  ABG:    Component Value Date/Time   PHART 7.339 (L) 07/02/2023 1113   PCO2ART 44.3 07/02/2023 1113   PO2ART 215 (H) 07/02/2023 1113   HCO3 23.9 07/02/2023 1113   TCO2 24 07/02/2023 1120   ACIDBASEDEF 2.0 07/02/2023 1113   O2SAT 100 07/02/2023 1113    Coagulation Profile: Recent Labs  Lab 06/27/23 1604 07/02/23 1244  INR 1.2 1.5*   Cardiac Enzymes: No results for input(s): "CKTOTAL", "CKMB", "CKMBINDEX", "TROPONINI" in the last 168 hours.  HbA1C: Hemoglobin A1C  Date/Time Value Ref Range Status  07/27/2020 01:51 PM 5.6 4.0 - 5.6 % Final   Hgb A1c MFr Bld  Date/Time Value Ref Range Status  06/18/2023 09:00 AM 5.9 (H) 4.8 - 5.6 % Final    Comment:    (NOTE) Pre diabetes:          5.7%-6.4%  Diabetes:              >6.4%  Glycemic control for   <7.0% adults with diabetes   01/09/2019 11:30 AM 5.4 4.8 - 5.6 % Final    Comment:  Prediabetes: 5.7 - 6.4          Diabetes: >6.4          Glycemic control for adults with diabetes: <7.0    CBG: Recent Labs  Lab 07/02/23 1239  GLUCAP 120*   Review of Systems:   Patient is encephalopathic and/or intubated; therefore, history has been obtained from chart review.   Past Medical History:  He,  has a past medical history of CAD (coronary artery disease), Dizziness (10/15/2013), HOCM (hypertrophic obstructive cardiomyopathy) (HCC), Hypertension, Hypertriglyceridemia, Obesity, and Severe mitral regurgitation.   Surgical History:   Past Surgical History:   Procedure Laterality Date   BIOPSY  06/28/2023   Procedure: BIOPSY;  Surgeon: Benancio Deeds, MD;  Location: [facility] ENDOSCOPY;  Service: Gastroenterology;;   COLONOSCOPY WITH PROPOFOL N/A 06/28/2023   Procedure: COLONOSCOPY WITH PROPOFOL;  Surgeon: Benancio Deeds, MD;  Location: [facility] ENDOSCOPY;  Service: Gastroenterology;  Laterality: N/A;   ESOPHAGOGASTRODUODENOSCOPY (EGD) WITH PROPOFOL N/A 06/28/2023   Procedure: ESOPHAGOGASTRODUODENOSCOPY (EGD) WITH PROPOFOL;  Surgeon: Benancio Deeds, MD;  Location: [facility] ENDOSCOPY;  Service: Gastroenterology;  Laterality: N/A;   INGUINAL HERNIA REPAIR Right 02/03/2021   Procedure: HERNIA REPAIR INGUINAL ADULT;  Surgeon: Diamantina Monks, MD;  Location: [facility] OR;  Service: General;  Laterality: Right;   INSERTION OF MESH Right 02/03/2021   Procedure: INSERTION OF MESH;  Surgeon: Diamantina Monks, MD;  Location: [facility] OR;  Service: General;  Laterality: Right;   NO PAST SURGERIES     POLYPECTOMY  06/28/2023   Procedure: POLYPECTOMY;  Surgeon: Benancio Deeds, MD;  Location: [facility] ENDOSCOPY;  Service: Gastroenterology;;   RIGHT/LEFT HEART CATH AND CORONARY ANGIOGRAPHY N/A 06/24/2023   Procedure: RIGHT/LEFT HEART CATH AND CORONARY ANGIOGRAPHY;  Surgeon: Yvonne Kendall, MD;  Location: [facility] INVASIVE CV LAB;  Service: Cardiovascular;  Laterality: N/A;   SUBMUCOSAL LIFTING INJECTION  06/28/2023   Procedure: SUBMUCOSAL LIFTING INJECTION;  Surgeon: Benancio Deeds, MD;  Location: [facility] ENDOSCOPY;  Service: Gastroenterology;;   SUBMUCOSAL TATTOO INJECTION  06/28/2023   Procedure: SUBMUCOSAL TATTOO INJECTION;  Surgeon: Benancio Deeds, MD;  Location: [facility] ENDOSCOPY;  Service: Gastroenterology;;   TRANSESOPHAGEAL ECHOCARDIOGRAM (CATH LAB) N/A 06/21/2023   Procedure: TRANSESOPHAGEAL ECHOCARDIOGRAM;  Surgeon: Sande Rives, MD;  Location: [facility] INVASIVE CV LAB;  Service: Cardiovascular;  Laterality: N/A;    Social History:   reports that he has never smoked. His  smokeless tobacco use includes chew. He reports current alcohol use. He reports that he does not use drugs.   Family History:  His family history includes Aneurysm in his mother; CAD in his mother; Lung disease in his mother; Other in his father.   Allergies: Allergies  Allergen Reactions   Iodinated Contrast Media Itching   Gadolinium Derivatives    Home Medications: Prior to Admission medications   Medication Sig Start Date End Date Taking? Authorizing Provider  acetaminophen (TYLENOL) 500 MG tablet Take 1,000 mg by mouth in the morning, at noon, and at bedtime.   Yes [provider]  aspirin 81 MG EC tablet Take 1 tablet (81 mg total) by mouth daily. 07/27/20  Yes Claiborne Rigg, NP  meloxicam (MOBIC) 15 MG tablet Take 1 tablet (15 mg total) by mouth daily. Patient not taking: Reported on 06/18/2023 05/23/23   Juanda Chance, NP  methocarbamol (ROBAXIN) 500 MG tablet Take 1 tablet (500 mg total) by mouth 3 (three) times daily. Patient not taking: Reported on 06/18/2023 05/23/23   Juanda Chance,  NP  amLODipine (NORVASC) 10 MG tablet Take 1 tablet (10 mg total) by mouth daily. Patient not taking: Reported on 06/18/2023 03/22/23   Gwyneth Sprout, MD  diclofenac (VOLTAREN) 75 MG EC tablet Take 1 tablet (75 mg total) by mouth 2 (two) times daily. Patient not taking: Reported on 06/18/2023 05/07/23   Storm Frisk, MD  furosemide (LASIX) 20 MG tablet Take 1 tablet (20 mg total) by mouth daily. 06/08/23   Claiborne Rigg, NP  gabapentin (NEURONTIN) 300 MG capsule Take 1 capsule (300 mg total) by mouth 3 (three) times daily. Patient not taking: Reported on 06/18/2023 05/07/23   Storm Frisk, MD  gemfibrozil (LOPID) 600 MG tablet Take 1 tablet (600 mg total) by mouth 2 (two) times daily before a meal. Patient not taking: Reported on 06/18/2023 03/22/23   Gwyneth Sprout, MD  losartan (COZAAR) 50 MG tablet Take 1 tablet (50 mg total) by mouth daily. Patient not  taking: Reported on 06/18/2023 10/15/22   Hoy Register, MD  predniSONE (DELTASONE) 50 MG tablet Take 1 tablet (50 mg total) by mouth daily with breakfast. Take until completed. Patient not taking: Reported on 06/18/2023 05/15/23   Juanda Chance, NP  rosuvastatin (CRESTOR) 20 MG tablet Take 1 tablet (20 mg total) by mouth daily. Patient not taking: Reported on 06/18/2023 03/22/23   Gwyneth Sprout, MD  tiZANidine (ZANAFLEX) 4 MG tablet Take 1 tablet (4 mg total) by mouth every 6 (six) hours as needed for muscle spasms. Patient not taking: Reported on 06/18/2023 05/07/23   Storm Frisk, MD   Critical care time:    The patient is critically ill with multiple organ system failure and requires high complexity decision making for assessment and support, frequent evaluation and titration of therapies, advanced monitoring, review of radiographic studies and interpretation of complex data.   Critical Care Time devoted to patient care services, exclusive of separately billable procedures, described in this note is 39 minutes.  Tim Lair, PA-C [facility] 07/02/23 1:25 PM  Please see [email or web address] for pager details.  From 7A-7P if no response, please call [phone] After hours, please call ELink [phone]

## 2023-07-02 NOTE — Anesthesia Postprocedure Evaluation (Signed)
Anesthesia Post Note  Patient: Victor [name]  Procedure(s) Performed: MITRAL VALVE (MV) REPLACEMENT USING 29 MM SJM MASTES SERIES MECHANICAL HEART VALVE, SUBAORTIC MYECTOMY (Chest) TRANSESOPHAGEAL ECHOCARDIOGRAM (TEE)     Patient location during evaluation: SICU Anesthesia Type: General Level of consciousness: sedated Pain management: pain level controlled Vital Signs Assessment: post-procedure vital signs reviewed and stable Respiratory status: respiratory function stable, patient on ventilator - see flowsheet for VS and patient remains intubated per anesthesia plan Cardiovascular status: stable and blood pressure returned to baseline Postop Assessment: no apparent nausea or vomiting Anesthetic complications: no   No notable events documented.  Last Vitals:  Vitals:   07/02/23 0608 07/02/23 1226  BP:    Pulse:    Resp: 20 (!) 22  Temp:    SpO2:      Last Pain:  Vitals:   07/02/23 0655  TempSrc:   PainSc: 0-No pain                 Mariann Barter

## 2023-07-02 NOTE — Anesthesia Procedure Notes (Addendum)
Central Venous Catheter Insertion Performed by: Mariann Barter, MD, anesthesiologist Start/End12/04/2023 7:00 AM, 07/02/2023 7:10 AM Patient location: Pre-op. Preanesthetic checklist: patient identified, IV checked, site marked, risks and benefits discussed, surgical consent, monitors and equipment checked, pre-op evaluation, timeout performed and anesthesia consent Position: Trendelenburg Lidocaine 1% used for infiltration and patient sedated Hand hygiene performed , maximum sterile barriers used  and Seldinger technique used Catheter size: 8 Fr Total catheter length 16. Central line and PA cath was placed.MAC introducer Swan type:thermodilution PA Cath depth:50 Procedure performed using ultrasound guided technique. Ultrasound Notes:anatomy identified, needle tip was noted to be adjacent to the nerve/plexus identified, no ultrasound evidence of intravascular and/or intraneural injection and image(s) printed for medical record Attempts: 1 Following insertion, dressing applied, line sutured and Biopatch. Post procedure assessment: blood return through all ports  Patient tolerated the procedure well with no immediate complications.

## 2023-07-02 NOTE — Anesthesia Preprocedure Evaluation (Signed)
Anesthesia Evaluation  Patient identified by MRN, date of birth, ID band Patient awake    Reviewed: Allergy & Precautions, NPO status , Patient's Chart, lab work & pertinent test results, reviewed documented beta blocker date and time   History of Anesthesia Complications Negative for: history of anesthetic complications  Airway Mallampati: II  TM Distance: >3 FB     Dental  (+) Edentulous Upper, Edentulous Lower   Pulmonary shortness of breath, with exertion and lying, neg sleep apnea, neg COPD   breath sounds clear to auscultation       Cardiovascular hypertension, (-) Past MI  Rhythm:Regular Rate:Normal + Systolic murmurs Normal LVEF, severe MR, subaortic membrane with SAM   Neuro/Psych neg Seizures  Neuromuscular disease    GI/Hepatic ,neg GERD  ,,(+) Cirrhosis         Endo/Other    Renal/GU Renal disease     Musculoskeletal   Abdominal   Peds  Hematology  (+) Blood dyscrasia, anemia   Anesthesia Other Findings   Reproductive/Obstetrics                              Anesthesia Physical Anesthesia Plan  ASA: 4  Anesthesia Plan: General   Post-op Pain Management:    Induction: Intravenous  PONV Risk Score and Plan: 2 and Ondansetron and Dexamethasone  Airway Management Planned: Oral ETT  Additional Equipment: Arterial line, PA Cath and 3D TEE  Intra-op Plan:   Post-operative Plan: Extubation in OR  Informed Consent: I have reviewed the patients History and Physical, chart, labs and discussed the procedure including the risks, benefits and alternatives for the proposed anesthesia with the patient or authorized representative who has indicated his/her understanding and acceptance.     Dental advisory given  Plan Discussed with: CRNA  Anesthesia Plan Comments:          Anesthesia Quick Evaluation

## 2023-07-02 NOTE — Transfer of Care (Signed)
Immediate Anesthesia Transfer of Care Note  Patient: Victor [name]  Procedure(s) Performed: MITRAL VALVE (MV) REPLACEMENT USING 29 MM SJM MASTES SERIES MECHANICAL HEART VALVE, SUBAORTIC MYECTOMY (Chest) TRANSESOPHAGEAL ECHOCARDIOGRAM (TEE)  Patient Location: ICU  Anesthesia Type:General  Level of Consciousness: sedated and unresponsive  Airway & Oxygen Therapy: Patient remains intubated per anesthesia plan and Patient placed on Ventilator (see vital sign flow sheet for setting)  Post-op Assessment: Report given to RN and Post -op Vital signs reviewed and stable  Post vital signs: Reviewed and stable  Last Vitals:  Vitals Value Taken Time  BP    Temp 36.9 C 07/02/23 1235  Pulse 79 07/02/23 1235  Resp 26 07/02/23 1235  SpO2 96 % 07/02/23 1235  Vitals shown include unfiled device data.  Last Pain:  Vitals:   07/02/23 0655  TempSrc:   PainSc: 0-No pain      Patients Stated Pain Goal: 0 (07/01/23 2201)  Complications: No notable events documented.

## 2023-07-02 NOTE — Progress Notes (Signed)
  Echocardiogram Echocardiogram Transesophageal has been performed.  Janalyn Harder 07/02/2023, 3:43 PM

## 2023-07-02 NOTE — Op Note (Addendum)
CARDIOVASCULAR SURGERY OPERATIVE NOTE  07/02/2023 Victor [name] [id number]  Surgeon:  Ashley Akin, MD  First Assistant: Aloha Gell [facility]                               An experienced assistant was required given the complexity of this surgery and the standard of surgical care. The assistant was needed for exposure, dissection, suctioning, retraction of delicate tissues and sutures, instrument exchange and for overall help during this procedure.     Preoperative Diagnosis:  Mediastinal Bleeding following Cardiac surgery  Postoperative Diagnosis:  Same   Procedure: Reexploration for bleeding Removal of mediastinal clot Control of pericardial bleeding site  Anesthesia:  General Endotracheal   Clinical History/Surgical Indication:  Pt is sp Mvreplacement and septal myectomy and developed ongoing hemorrhage immediately post op. Pt required reexploration to determine site of bleeding  Findings: Moderate amount of mediastinal clot. No source for it other than pericardial bleeding site on inferior vena cava area  Preparation:  The patient was seen in the preoperative holding area and the correct patient, correct operation were confirmed with the patient after reviewing the medical record and catheterization. The consent was signed by me. Preoperative antibiotics were given. A pulmonary arterial line and radial arterial line were placed by the anesthesia team. The patient was taken back to the operating room and positioned supine on the operating room table. After being placed under general endotracheal anesthesia by the anesthesia team a foley catheter was placed. The neck, chest, abdomen, and both legs were prepped with betadine soap and solution and draped in the usual sterile manner. A surgical time-out was taken and the correct patient and operative procedure were confirmed with the nursing and anesthesia staff.  Operation: Previous median sternotomy incision was then reopened and  the suture material removed.  Sternal wires were removed and the retractor was placed was removed.  All suture lines appeared to be hemostatic and a thorough search revealed only a small amount of bleeding at the inferior vena cava pericardial tear site.  Controlled with the Bovie electrocautery and Surgicel.  All chest tubes were declotted and the mediastinum irrigated.  With no further evidence of bleeding the sternum was reapproximated with interrupted stainless steel wires and the presternal subcutaneous tissue and skin were closed multiple layers absorbable suture.   Following skin closure point was over a 100cc in pleurovac. The incision was again opened and a thorough exam found only oozing and coags were ordered and blood products delivered. After, the sternal wires were replaced and the incision closed in layers and sterile dressings were applied

## 2023-07-02 NOTE — Anesthesia Procedure Notes (Signed)
Procedure Name: Intubation Date/Time: 07/02/2023 7:49 AM  Performed by: Marena Chancy, CRNAPre-anesthesia Checklist: Patient identified, Emergency Drugs available, Suction available and Patient being monitored Patient Re-evaluated:Patient Re-evaluated prior to induction Oxygen Delivery Method: Circle System Utilized Preoxygenation: Pre-oxygenation with 100% oxygen Induction Type: IV induction Ventilation: Mask ventilation without difficulty Laryngoscope Size: Miller and 2 Grade View: Grade I Tube type: Oral Tube size: 8.0 mm Number of attempts: 1 Airway Equipment and Method: Stylet and Oral airway Placement Confirmation: ETT inserted through vocal cords under direct vision, positive ETCO2 and breath sounds checked- equal and bilateral Tube secured with: Tape Dental Injury: Teeth and Oropharynx as per pre-operative assessment

## 2023-07-02 NOTE — Transfer of Care (Signed)
Immediate Anesthesia Transfer of Care Note  Patient: Victor [name]  Procedure(s) Performed: EXPLORATION POST OPERATIVE OPEN HEART (Chest)  Patient Location: SICU  Anesthesia Type:General  Level of Consciousness: Patient remains intubated per anesthesia plan  Airway & Oxygen Therapy: Patient remains intubated per anesthesia plan  Post-op Assessment: Report given to RN and Post -op Vital signs reviewed and stable  Post vital signs: Reviewed and stable  Last Vitals:  Vitals Value Taken Time  BP 115/68   Temp 35.9 C 07/02/23 1815  Pulse 89 07/02/23 1815  Resp 24 07/02/23 1815  SpO2 99 % 07/02/23 1815  Vitals shown include unfiled device data.  Last Pain:  Vitals:   07/02/23 1432  TempSrc: Core  PainSc:       Patients Stated Pain Goal: 0 (07/01/23 2201)  Complications: No notable events documented.

## 2023-07-02 NOTE — Anesthesia Preprocedure Evaluation (Signed)
Anesthesia Evaluation  Patient identified by MRN, date of birth, ID band Patient unresponsive    Reviewed: Unable to perform ROS - Chart review onlyPreop documentation limited or incomplete due to emergent nature of procedure.  History of Anesthesia Complications Negative for: history of anesthetic complications  Airway Mallampati: Intubated       Dental   Pulmonary shortness of breath      + intubated    Cardiovascular hypertension, + CAD  + Valvular Problems/Murmurs  Rhythm:Regular Rate:Normal     Neuro/Psych  Neuromuscular disease    GI/Hepatic   Endo/Other    Renal/GU      Musculoskeletal   Abdominal   Peds  Hematology  (+) Blood dyscrasia, anemia   Anesthesia Other Findings   Reproductive/Obstetrics                             Anesthesia Physical Anesthesia Plan  ASA: 2  Anesthesia Plan: General   Post-op Pain Management:    Induction:   PONV Risk Score and Plan:   Airway Management Planned: Oral ETT  Additional Equipment: Arterial line, 3D TEE, CVP and PA Cath  Intra-op Plan:   Post-operative Plan: Possible Post-op intubation/ventilation  Informed Consent: I have reviewed the patients History and Physical, chart, labs and discussed the procedure including the risks, benefits and alternatives for the proposed anesthesia with the patient or authorized representative who has indicated his/her understanding and acceptance.     History available from chart only and Only emergency history available  Plan Discussed with: CRNA  Anesthesia Plan Comments:        Anesthesia Quick Evaluation

## 2023-07-02 NOTE — Anesthesia Procedure Notes (Signed)
Arterial Line Insertion Start/End12/04/2023 7:05 AM, 07/02/2023 7:11 AM Performed by: April Holding, CRNA, CRNA  Preanesthetic checklist: patient identified Lidocaine 1% used for infiltration and patient sedated radial was placed Catheter size: 20 G  Attempts: 1 Procedure performed without using ultrasound guided technique. Ultrasound Notes:anatomy identified Following insertion, dressing applied. Post procedure assessment: decreased circulation  Patient tolerated the procedure well with no immediate complications.

## 2023-07-02 NOTE — Anesthesia Procedure Notes (Signed)
Central Venous Catheter Insertion Performed by: Mariann Barter, MD, anesthesiologist Patient location: Pre-op. Preanesthetic checklist: patient identified, IV checked, site marked, risks and benefits discussed, surgical consent, monitors and equipment checked, pre-op evaluation, timeout performed and anesthesia consent Hand hygiene performed  and maximum sterile barriers used  PA cath was placed.Swan type:thermodilution PA Cath depth:50 Procedure performed without using ultrasound guided technique. Attempts: 1 Following insertion, line sutured, dressing applied and Biopatch. Patient tolerated the procedure well with no immediate complications.

## 2023-07-02 NOTE — Interval H&P Note (Signed)
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  The various methods of treatment have been discussed with the patient and family. After consideration of risks, benefits and other options for treatment, the patient has consented to  Procedure(s): MITRAL VALVE (MV) REPLACEMENT, Subaortic Myectomy (N/A) TRANSESOPHAGEAL ECHOCARDIOGRAM (TEE) (N/A) as a surgical intervention.  The patient's history has been reviewed, patient examined, no change in status, stable for surgery.  I have reviewed the patient's chart and labs.  Questions were answered to the patient's satisfaction.     Eugenio Hoes

## 2023-07-02 NOTE — Op Note (Signed)
CARDIOVASCULAR SURGERY OPERATIVE NOTE  07/02/2023 Victor [name] [id number]  Surgeon:  Ashley Akin, MD  First Assistant: Kathyrn Drown MD                                An experienced assistant was required given the complexity of this surgery and the standard of surgical care. The assistant was needed for exposure, dissection, suctioning, retraction of delicate tissues and sutures, instrument exchange and for overall help during this procedure.     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    Findings:  The left ventricle was extremely hypertrophied and the MV had severe MR. 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 Healed vegetations on both leaflets. There was a subaortic membrane and following surgery there was a well seated MV prosthesis with an LVOT gradient of  Preparation:  The patient was seen in the preoperative holding area and the correct patient, correct operation were confirmed with the patient after reviewing the medical record and catheterization. The consent was signed by me. Preoperative antibiotics were given. A pulmonary arterial line and radial arterial line were placed by the anesthesia team. The patient was taken back to the operating room and positioned supine on the operating room table. After being placed under general endotracheal  anesthesia by the anesthesia team a foley catheter was placed. The neck, chest, abdomen, and both legs were prepped with betadine soap and solution and draped in the usual sterile manner. A surgical time-out was taken and the correct patient and operative procedure were confirmed with the nursing and anesthesia staff.  Operation: A median sternotomy incision was then created and the sternum divided with the sternal saw.  Pericardial well was created and heparin delivered.  Aorta was cannulated with a 20 [place] Starns cannula and a 36 straight venous cannula was placed in the right atrial appendage ventricular the inferior vena cava.  An additional 36 French right angle cannula was placed in the superior vena cava.  With adequate confirmation of anticoagulation cardiopulmonary bypass was instituted. An antegrade cardioplegia catheter was placed in the ascending aorta and aortic cross-clamp placed.  Cold blood [place] cardioplegia was delivered for approximately 1300 cc an additional 400 cc was delivered at approximately 65 minutes following cross-clamp.  A reanimation dose was delivered just prior to cross-clamp removal. The left atrium was opened in the atrial groove and difficult exposure of the mitral valve was a counted secondary to the hypertrophied left ventricle.  The left atrial appendage was oversewn at its base with a running 4-0 Prolene suture.  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  With Further Resection of the Valve It Was Noted That There Appeared to Be Some Calcification Versus Vegetation of the Some Mitral Apparatus and Therefore This Was All Resected.  The Rest of the Posterior Annulus at the 2-0 [place] Pledgeted Sutures Placed Again with the Pledgets on the Atrial Side.  A 29 Mm Saint Jude Prosthesis Was Then Secured Utilizing the [employer]  Left Atrium Was Closed over Ventricular Sump.  This Was with 4-0 Prolene Sutures. Aortotomy was then performed and the aortic valve leaflets were all intact and the subaortic membrane was resected sharply with a 15 blade.  The myectomy was then performed and the septum beginning at the level of the right coronary artery and going laterally.  The septum was resected to ensure adequate outflow tract and the aortotomy was closed with a running 4-0 Prolene pledgeted suture. The patient in headdown position the aortic clamp was removed and ventricular and atrial pacing wires were placed and brought out the inferior stab wounds and secured.  Following de-airing patient was then weaned from cardiopulmonary bypass and with adequate hemodynamics protamine was delivered and the patient was decannulated and sites oversewn necessary.  Chest tubes were brought inferior stab was and secured the sternum was reapproximated with interrupted stainless steel wires and the presternal subcutaneous tissue and skin were closed multilayer's absorbable suture.

## 2023-07-02 NOTE — Progress Notes (Signed)
  Echocardiogram Echocardiogram Transesophageal has been performed.  Janalyn Harder 07/02/2023, 8:32 AM

## 2023-07-03 ENCOUNTER — Inpatient Hospital Stay (HOSPITAL_COMMUNITY): Payer: Medicaid Other

## 2023-07-03 ENCOUNTER — Encounter (HOSPITAL_COMMUNITY): Payer: Self-pay | Admitting: Thoracic Surgery (Cardiothoracic Vascular Surgery)

## 2023-07-03 DIAGNOSIS — R0989 Other specified symptoms and signs involving the circulatory and respiratory systems: Secondary | ICD-10-CM | POA: Diagnosis not present

## 2023-07-03 DIAGNOSIS — R7881 Bacteremia: Secondary | ICD-10-CM | POA: Diagnosis not present

## 2023-07-03 DIAGNOSIS — I110 Hypertensive heart disease with heart failure: Secondary | ICD-10-CM | POA: Diagnosis not present

## 2023-07-03 DIAGNOSIS — K7460 Unspecified cirrhosis of liver: Secondary | ICD-10-CM | POA: Diagnosis not present

## 2023-07-03 DIAGNOSIS — J9811 Atelectasis: Secondary | ICD-10-CM | POA: Diagnosis not present

## 2023-07-03 DIAGNOSIS — I5033 Acute on chronic diastolic (congestive) heart failure: Secondary | ICD-10-CM | POA: Diagnosis not present

## 2023-07-03 DIAGNOSIS — Z452 Encounter for adjustment and management of vascular access device: Secondary | ICD-10-CM | POA: Diagnosis not present

## 2023-07-03 DIAGNOSIS — B954 Other streptococcus as the cause of diseases classified elsewhere: Secondary | ICD-10-CM | POA: Diagnosis not present

## 2023-07-03 DIAGNOSIS — D696 Thrombocytopenia, unspecified: Secondary | ICD-10-CM | POA: Diagnosis not present

## 2023-07-03 DIAGNOSIS — Q244 Congenital subaortic stenosis: Secondary | ICD-10-CM | POA: Diagnosis not present

## 2023-07-03 DIAGNOSIS — B3781 Candidal esophagitis: Secondary | ICD-10-CM | POA: Diagnosis not present

## 2023-07-03 DIAGNOSIS — Z4682 Encounter for fitting and adjustment of non-vascular catheter: Secondary | ICD-10-CM | POA: Diagnosis not present

## 2023-07-03 DIAGNOSIS — Z1152 Encounter for screening for COVID-19: Secondary | ICD-10-CM | POA: Diagnosis not present

## 2023-07-03 DIAGNOSIS — I059 Rheumatic mitral valve disease, unspecified: Secondary | ICD-10-CM | POA: Diagnosis not present

## 2023-07-03 DIAGNOSIS — I051 Rheumatic mitral insufficiency: Secondary | ICD-10-CM | POA: Diagnosis not present

## 2023-07-03 DIAGNOSIS — E871 Hypo-osmolality and hyponatremia: Secondary | ICD-10-CM | POA: Diagnosis not present

## 2023-07-03 LAB — BPAM FFP
Blood Product Expiration Date: 202412142359
Blood Product Expiration Date: 202412142359
ISSUE DATE / TIME: 202412101504
ISSUE DATE / TIME: 202412101504
Unit Type and Rh: 6200
Unit Type and Rh: 6200

## 2023-07-03 LAB — BASIC METABOLIC PANEL
Anion gap: 7 (ref 5–15)
Anion gap: 11 (ref 5–15)
BUN: 9 mg/dL (ref 6–20)
BUN: 12 mg/dL (ref 6–20)
CO2: 20 mmol/L — ABNORMAL LOW (ref 22–32)
CO2: 20 mmol/L — ABNORMAL LOW (ref 22–32)
Calcium: 8.3 mg/dL — ABNORMAL LOW (ref 8.9–10.3)
Calcium: 8.8 mg/dL — ABNORMAL LOW (ref 8.9–10.3)
Chloride: 103 mmol/L (ref 98–111)
Chloride: 98 mmol/L (ref 98–111)
Creatinine, Ser: 0.65 mg/dL (ref 0.61–1.24)
Creatinine, Ser: 0.81 mg/dL (ref 0.61–1.24)
GFR, Estimated: >60 mL/min (ref >60)
GFR, Estimated: >60 mL/min (ref >60)
Glucose, Bld: 155 mg/dL — ABNORMAL HIGH (ref 70–99)
Glucose, Bld: 184 mg/dL — ABNORMAL HIGH (ref 70–99)
Potassium: 4.6 mmol/L (ref 3.5–5.1)
Potassium: 4.4 mmol/L (ref 3.5–5.1)
Sodium: 130 mmol/L — ABNORMAL LOW (ref 135–145)
Sodium: 129 mmol/L — ABNORMAL LOW (ref 135–145)

## 2023-07-03 LAB — POCT I-STAT 7, (LYTES, BLD GAS, ICA,H+H)
Acid-base deficit: 2 mmol/L (ref 0.0–2.0)
Acid-base deficit: 2 mmol/L (ref 0.0–2.0)
Acid-base deficit: 3 mmol/L — ABNORMAL HIGH (ref 0.0–2.0)
Acid-base deficit: 4 mmol/L — ABNORMAL HIGH (ref 0.0–2.0)
Bicarbonate: 22.6 mmol/L (ref 20.0–28.0)
Bicarbonate: 21.5 mmol/L (ref 20.0–28.0)
Bicarbonate: 20.2 mmol/L (ref 20.0–28.0)
Bicarbonate: 20.1 mmol/L (ref 20.0–28.0)
Calcium, Ion: 1.21 mmol/L (ref 1.15–1.40)
Calcium, Ion: 1.2 mmol/L (ref 1.15–1.40)
Calcium, Ion: 1.2 mmol/L (ref 1.15–1.40)
Calcium, Ion: 1.2 mmol/L (ref 1.15–1.40)
HCT: 25 % — ABNORMAL LOW (ref 39.0–52.0)
HCT: 26 % — ABNORMAL LOW (ref 39.0–52.0)
HCT: 24 % — ABNORMAL LOW (ref 39.0–52.0)
HCT: 27 % — ABNORMAL LOW (ref 39.0–52.0)
Hemoglobin: 8.5 g/dL — ABNORMAL LOW (ref 13.0–17.0)
Hemoglobin: 8.8 g/dL — ABNORMAL LOW (ref 13.0–17.0)
Hemoglobin: 8.2 g/dL — ABNORMAL LOW (ref 13.0–17.0)
Hemoglobin: 9.2 g/dL — ABNORMAL LOW (ref 13.0–17.0)
O2 Saturation: 100 %
O2 Saturation: 99 %
O2 Saturation: 98 %
O2 Saturation: 99 %
Patient temperature: 37.7
Patient temperature: 38
Patient temperature: 37.9
Patient temperature: 37.7
Potassium: 4.5 mmol/L (ref 3.5–5.1)
Potassium: 4.7 mmol/L (ref 3.5–5.1)
Potassium: 4.7 mmol/L (ref 3.5–5.1)
Potassium: 4.5 mmol/L (ref 3.5–5.1)
Sodium: 133 mmol/L — ABNORMAL LOW (ref 135–145)
Sodium: 135 mmol/L (ref 135–145)
Sodium: 135 mmol/L (ref 135–145)
Sodium: 134 mmol/L — ABNORMAL LOW (ref 135–145)
TCO2: 24 mmol/L (ref 22–32)
TCO2: 23 mmol/L (ref 22–32)
TCO2: 21 mmol/L — ABNORMAL LOW (ref 22–32)
TCO2: 21 mmol/L — ABNORMAL LOW (ref 22–32)
pCO2 arterial: 35.9 mm[Hg] (ref 32–48)
pCO2 arterial: 34.5 mm[Hg] (ref 32–48)
pCO2 arterial: 31.2 mm[Hg] — ABNORMAL LOW (ref 32–48)
pCO2 arterial: 32.6 mm[Hg] (ref 32–48)
pH, Arterial: 7.411 (ref 7.35–7.45)
pH, Arterial: 7.408 (ref 7.35–7.45)
pH, Arterial: 7.423 (ref 7.35–7.45)
pH, Arterial: 7.401 (ref 7.35–7.45)
pO2, Arterial: 197 mm[Hg] — ABNORMAL HIGH (ref 83–108)
pO2, Arterial: 135 mm[Hg] — ABNORMAL HIGH (ref 83–108)
pO2, Arterial: 112 mm[Hg] — ABNORMAL HIGH (ref 83–108)
pO2, Arterial: 118 mm[Hg] — ABNORMAL HIGH (ref 83–108)

## 2023-07-03 LAB — CBC
HCT: 25.2 % — ABNORMAL LOW (ref 39.0–52.0)
HCT: 27.4 % — ABNORMAL LOW (ref 39.0–52.0)
Hemoglobin: 8.6 g/dL — ABNORMAL LOW (ref 13.0–17.0)
Hemoglobin: 9 g/dL — ABNORMAL LOW (ref 13.0–17.0)
MCH: 30 pg (ref 26.0–34.0)
MCH: 29.7 pg (ref 26.0–34.0)
MCHC: 34.1 g/dL (ref 30.0–36.0)
MCHC: 32.8 g/dL (ref 30.0–36.0)
MCV: 87.8 fL (ref 80.0–100.0)
MCV: 90.4 fL (ref 80.0–100.0)
Platelets: 113 10*3/uL — ABNORMAL LOW (ref 150–400)
Platelets: 124 10*3/uL — ABNORMAL LOW (ref 150–400)
RBC: 2.87 MIL/uL — ABNORMAL LOW (ref 4.22–5.81)
RBC: 3.03 MIL/uL — ABNORMAL LOW (ref 4.22–5.81)
RDW: 15.8 % — ABNORMAL HIGH (ref 11.5–15.5)
RDW: 15.9 % — ABNORMAL HIGH (ref 11.5–15.5)
WBC: 11.3 10*3/uL — ABNORMAL HIGH (ref 4.0–10.5)
WBC: 16 10*3/uL — ABNORMAL HIGH (ref 4.0–10.5)
nRBC: 0 % (ref 0.0–0.2)
nRBC: 0 % (ref 0.0–0.2)

## 2023-07-03 LAB — BPAM PLATELET PHERESIS
Blood Product Expiration Date: 202412122359
Blood Product Expiration Date: 202412132359
ISSUE DATE / TIME: 202412101502
ISSUE DATE / TIME: 202412101657
Unit Type and Rh: 6200
Unit Type and Rh: 6200

## 2023-07-03 LAB — GLUCOSE, CAPILLARY
Glucose-Capillary: 132 mg/dL — ABNORMAL HIGH (ref 70–99)
Glucose-Capillary: 137 mg/dL — ABNORMAL HIGH (ref 70–99)
Glucose-Capillary: 138 mg/dL — ABNORMAL HIGH (ref 70–99)
Glucose-Capillary: 140 mg/dL — ABNORMAL HIGH (ref 70–99)
Glucose-Capillary: 141 mg/dL — ABNORMAL HIGH (ref 70–99)
Glucose-Capillary: 146 mg/dL — ABNORMAL HIGH (ref 70–99)
Glucose-Capillary: 147 mg/dL — ABNORMAL HIGH (ref 70–99)
Glucose-Capillary: 148 mg/dL — ABNORMAL HIGH (ref 70–99)
Glucose-Capillary: 151 mg/dL — ABNORMAL HIGH (ref 70–99)
Glucose-Capillary: 152 mg/dL — ABNORMAL HIGH (ref 70–99)
Glucose-Capillary: 154 mg/dL — ABNORMAL HIGH (ref 70–99)
Glucose-Capillary: 161 mg/dL — ABNORMAL HIGH (ref 70–99)
Glucose-Capillary: 168 mg/dL — ABNORMAL HIGH (ref 70–99)
Glucose-Capillary: 173 mg/dL — ABNORMAL HIGH (ref 70–99)

## 2023-07-03 LAB — BPAM CRYOPRECIPITATE
Blood Product Expiration Date: 202412152359
Blood Product Expiration Date: 202412152359
ISSUE DATE / TIME: 202412101505
ISSUE DATE / TIME: 202412101518
Unit Type and Rh: 5100
Unit Type and Rh: 6200

## 2023-07-03 LAB — PREPARE CRYOPRECIPITATE
Unit division: 0
Unit division: 0

## 2023-07-03 LAB — PREPARE PLATELET PHERESIS
Unit division: 0
Unit division: 0

## 2023-07-03 LAB — PREPARE FRESH FROZEN PLASMA: Unit division: 0

## 2023-07-03 LAB — MAGNESIUM
Magnesium: 2.4 mg/dL (ref 1.7–2.4)
Magnesium: 2.2 mg/dL (ref 1.7–2.4)

## 2023-07-03 LAB — TRIGLYCERIDES: Triglycerides: 96 mg/dL (ref <150)

## 2023-07-03 LAB — SURGICAL PATHOLOGY

## 2023-07-03 MED ORDER — ENOXAPARIN SODIUM 40 MG/0.4ML IJ SOSY
40.0000 mg | PREFILLED_SYRINGE | INTRAMUSCULAR | Status: DC
Start: 2023-07-03 — End: 2023-07-04
  Administered 2023-07-03: 40 mg via SUBCUTANEOUS
  Filled 2023-07-03: qty 0.4

## 2023-07-03 MED ORDER — AMLODIPINE BESYLATE 5 MG PO TABS
5.0000 mg | ORAL_TABLET | Freq: Every day | ORAL | Status: DC
  Administered 2023-07-03 – 2023-07-07 (×5): 5 mg via ORAL
  Filled 2023-07-03 (×5): qty 1

## 2023-07-03 MED ORDER — INSULIN ASPART 100 UNIT/ML IJ SOLN
0.0000 [IU] | INTRAMUSCULAR | Status: DC
  Administered 2023-07-03 (×2): 2 [IU] via SUBCUTANEOUS
  Administered 2023-07-03: 3 [IU] via SUBCUTANEOUS
  Administered 2023-07-04 (×2): 2 [IU] via SUBCUTANEOUS
  Administered 2023-07-04 (×2): 3 [IU] via SUBCUTANEOUS
  Administered 2023-07-04: 2 [IU] via SUBCUTANEOUS
  Administered 2023-07-05: 3 [IU] via SUBCUTANEOUS
  Administered 2023-07-05 (×2): 2 [IU] via SUBCUTANEOUS

## 2023-07-03 MED ORDER — ORAL CARE MOUTH RINSE: 15.0000 mL | OROMUCOSAL | Status: DC | PRN

## 2023-07-03 MED ORDER — FUROSEMIDE 10 MG/ML IJ SOLN
40.0000 mg | Freq: Once | INTRAMUSCULAR | Status: AC
  Administered 2023-07-03: 40 mg via INTRAVENOUS
  Filled 2023-07-03: qty 4

## 2023-07-03 MED ORDER — FENTANYL 2500MCG IN NS 250ML (10MCG/ML) PREMIX INFUSION: 0.0000 ug/h | INTRAVENOUS | Status: DC

## 2023-07-03 MED ORDER — MELATONIN 3 MG PO TABS
3.0000 mg | ORAL_TABLET | Freq: Every day | ORAL | Status: DC
  Administered 2023-07-03 – 2023-07-07 (×5): 3 mg via ORAL
  Filled 2023-07-03 (×5): qty 1

## 2023-07-03 MED ORDER — TRAZODONE HCL 100 MG PO TABS
100.0000 mg | ORAL_TABLET | Freq: Every day | ORAL | Status: DC
  Administered 2023-07-03 – 2023-07-13 (×11): 100 mg via ORAL
  Filled 2023-07-03 (×6): qty 1
  Filled 2023-07-03: qty 2
  Filled 2023-07-03 (×2): qty 1
  Filled 2023-07-03: qty 2
  Filled 2023-07-03: qty 1

## 2023-07-03 MED ORDER — NICOTINE 14 MG/24HR TD PT24
14.0000 mg | MEDICATED_PATCH | Freq: Every day | TRANSDERMAL | Status: DC
  Administered 2023-07-03 – 2023-07-14 (×12): 14 mg via TRANSDERMAL
  Filled 2023-07-03 (×12): qty 1

## 2023-07-03 NOTE — Procedures (Deleted)
Arterial Catheter Insertion Procedure Note  Victor [name]  [id number]  1970/09/24  Date:07/03/23  Time:10:30 AM    Provider Performing: Lorin Glass    Procedure: Insertion of Arterial Line (54627) with US guidance (03500)   Indication(s) Blood pressure monitoring and/or need for frequent ABGs  Consent Verbal with family at bedside  Anesthesia None   Time Out Verified patient identification, verified procedure, site/side was marked, verified correct patient position, special equipment/implants available, medications/allergies/relevant history reviewed, required imaging and test results available.   Sterile Technique Maximal sterile technique including full sterile barrier drape, hand hygiene, sterile gown, sterile gloves, mask, hair covering, sterile ultrasound probe cover (if used).   Procedure Description Area of catheter insertion was cleaned with chlorhexidine and draped in sterile fashion. With real-time ultrasound guidance an arterial catheter was placed into the left femoral artery.  Appropriate arterial tracings confirmed on monitor.     Complications/Tolerance None; patient tolerated the procedure well.   EBL Minimal   Specimen(s) None

## 2023-07-03 NOTE — Progress Notes (Signed)
NAME:  Victor [name], MRN:  [id number], DOB:  09-Sep-1970, LOS: 15 ADMISSION DATE:  06/17/2023 CONSULTATION DATE:  07/02/2023 REFERRING MD:  Leafy Ro - TCTS CHIEF COMPLAINT:  Postoperative vent management   History of Present Illness:  Victor [name] is seen in consultation at the request of Dr. Leafy Ro (TCTS) for management of mechanical ventilation and hemodynamics post-MVR.  52 year old man who initially presented to [facility] 11/25 for SOB, orthopnea, BLE edema and back pain x 2 weeks.  PMHx significant for HTN, HLD, nonobstructive CAD, diastolic HFpEF (Echo 05/2023 with EF 65-70%, G2DD, +SAM with subaortic membrane of LVOT, degenerative MV with calcified anterior/posterior leaflets), obesity, EtOH use, low back pain. 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 [facility] 06/2023 demonstrated nonobstructive CAD, severely elevated L heart and PA pressures, normal CO/CI; possible HOCM. cMRI 06/2023 showed small pericardial effusion, suspected LV outflow gradient, +SAM, moderate to severe MR, noncoronary LGE c/w HOCM/infiltrative disease.  Underwent MVR 12/10 with Dr. 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   PCCM consulted for post-operative ventilator management.  Pertinent Medical History:   Past Medical History:  Diagnosis Date   CAD (coronary artery disease)    Nonobstructive on [facility] 06/2023   Dizziness 10/15/2013    "SWIMMY HEAD    "   HOCM (hypertrophic obstructive cardiomyopathy) (HCC)    Hypertension    Hypertriglyceridemia    Obesity    Severe mitral regurgitation    Significant Hospital Events: Including procedures, antibiotic start and stop dates in addition to other pertinent events   12/10: MVR. CCM consulted for MV/hemodynamics. 12/11: extubated early AM POD 1. Pressors off. Tachypneic with small breaths.  +diuretics. Dc swan and a-line  Interim History / Subjective:  No acute events overnight. He was extubated ~2AM. On 4LNC. Tachypneic with shallow breaths. IS only ~ 250. Denies significant pain. Feels mildly anxious. Looks a bit anxious. +diuretics this AM. Dc swan and a-line.   Objective:  Blood pressure 114/60, pulse 89, temperature 99.9 F (37.7 C), temperature source Core (Comment), resp. rate (!) 37, height 5\' 7"  (1.702 m), weight 94 kg, SpO2 96%. PAP: (24-49)/(12-29) 30/25 CVP:  [8 mmHg-25 mmHg] 16 mmHg CO:  [4.7 L/min-7.5 L/min] 7.3 L/min CI:  [2.4 L/min/m2-3.8 L/min/m2] 3.7 L/min/m2  Vent Mode: PSV;CPAP FiO2 (%):  [40 %-50 %] 40 % Set Rate:  [4 bmp-24 bmp] 4 bmp Vt Set:  [520 mL] 520 mL PEEP:  [5 cmH20-10 cmH20] 5 cmH20 Pressure Support:  [10 cmH20] 10 cmH20 Plateau Pressure:  [13 cmH20-23 cmH20] 23 cmH20   Intake/Output Summary (Last 24 hours) at 07/03/2023 0857 Last data filed at 07/03/2023 0800 Gross per 24 hour  Intake 11480.59 ml  Output 6405 ml  Net 5075.59 ml   Filed Weights   07/01/23 0418 07/02/23 0340 07/03/23 0500  Weight: 87.6 kg 87 kg 94 kg   Physical Examination: General: middle aged male, acute ill appearing. No acute distress. Mildly anxious appearing.  HEENT: [place]/AT, anicteric sclera, PERRLA 3mm, moist mucous membranes. Neuro: alert, oriented x4. Follows commands. Moves all extremities. Non focal exam. Mildly anxious.  CV: RRR, paced (VVI) no m/g/r. Sternotomy dressing CDI. CT in place with serosanguinous drainage  PULM: 4LNC. Tachypneic, shallow breaths. Diminished bilaterally. 250cc IS.  GI: Soft, nontender, nondistended. Normoactive bowel sounds. Extremities: trace edema  Skin: Warm/dry, no rashes.  Resolved Hospital Problem List:  Post-operative vent management  Assessment & Plan:  Post operative vent management, resolved  Post-operative atelectasis; extubated 12/11 early AM. On 4LNC. 250 on IS. Shallow, tachypneic breaths. ABG this AM  unremarkable. ?Anxiety  - f/u AM CXR - O2 to maintain sat >90, wean as able  - frequent IS, pulm hygiene  - + Lasix 40 IV today  Postoperative vent management  S/p MVR with St. Jude mechanical prosthesis (29mm) Severe MR with endocarditis, now s/p MVR Acute-on-chronic HFpEF ?HOCM Nonobstructive CAD Echo 05/2023 with EF 65-70%, G2DD, +SAM with subaortic membrane of LVOT, degenerative MV with calcified anterior/posterior leaflets. [facility] 06/2023 +nonobstructive CAD, severely elevated L heart and PA pressures, normal CO/CI; possible HOCM. cMRI 06/2023 showed small pericardial effusion, suspected LV outflow gradient, +SAM, moderate to severe MR, noncoronary LGE c/w HOCM/infiltrative disease. - DC swan, aline per TCTS  - pressors off overnight  - resume metoprolol, ASA, amlodipine per TCTS  - Postoperative care per TCTS - Multimodal pain control with tylenol, gabapentin, robaxin, morphine, oxycodone, tramadol - CT management per protocol - Continue Pen G  ABLA in the setting of cardiac surgery - Trend H&H, Plt - Monitor for signs of active bleeding - Transfuse for Hgb < 7.0, Plt < 20 or hemodynamically significant bleeding (per TCTS) - Protamine x 1 ordered for heparin reversal, given copious CT output first 1 hour post-op - output okay now  EtOH use, daily - dex off  - Thiamine/folate/ mtvn  - CIWA if needed   Tobacco use  - counsel on cessation  - nicotine patch   Best Practice: (right click and "Reselect all SmartList Selections" daily)   Diet/type: clear liquids DVT prophylaxis: LMWH GI prophylaxis: PPI Lines: Central line, Arterial Line, and No longer needed.  Order written to d/c  Foley:  Yes, and it is still needed Code Status:  full code Last date of multidisciplinary goals of care discussion [Per TCTS]  Labs:  CBC: Recent Labs  Lab 06/28/23 0227 06/29/23 0217 06/30/23 0310 07/01/23 0236 07/02/23 0227 07/02/23 0755 07/02/23 1031 07/02/23 1113 07/02/23 1244  07/02/23 1408 07/02/23 1631 07/02/23 1738 07/02/23 1943 07/02/23 2329 07/03/23 0120 07/03/23 0211 07/03/23 0341 07/03/23 0412 07/03/23 0800  WBC 9.8 9.6 8.6 8.0 7.5  --   --   --  22.6*  --   --   --  13.4*  --   --   --   --  11.3*  --   NEUTROABS 6.9 6.6 5.5 4.8 4.7  --   --   --   --   --   --   --   --   --   --   --   --   --   --   HGB 8.3* 8.0* 8.0* 7.8* 7.7*   < > 6.3*   < > 7.7*   < >  --    < > 8.8*   < > 8.5* 8.8* 8.2* 8.6* 9.2*  HCT 26.9* 26.1* 25.7* 24.9* 24.6*   < > 19.9*   < > 24.2*   < >  --    < > 26.0*   < > 25.0* 26.0* 24.0* 25.2* 27.0*  MCV 92.4 93.2 92.8 91.9 91.4  --   --   --  92.0  --   --   --  88.4  --   --   --   --  87.8  --   PLT 242 234 218 218 200  --  171  --  143*  --  127*  --  108*  --   --   --   --  113*  --    < > = values in this interval not displayed.   Basic Metabolic Panel: Recent Labs  Lab 06/30/23 0310 07/01/23 0236 07/02/23 0227 07/02/23 0755 07/02/23 1120 07/02/23 1241 07/02/23 1525 07/02/23 1528 07/02/23 1742 07/02/23 1943 07/02/23 2329 07/03/23 0120 07/03/23 0211 07/03/23 0341 07/03/23 0412 07/03/23 0800  NA 133* 132* 133*   < > 133*   < > 136   < > 135 131*   < > 133* 135 135 130* 134*  K 3.9 4.0 4.1   < > 4.8   < > 5.1   < > 5.5* 5.1   < > 4.5 4.7 4.7 4.6 4.5  CL 100 99 100   < > 100  --  102  --  100 102  --   --   --   --  103  --   CO2 22 23 24   --   --   --   --   --   --  21*  --   --   --   --  20*  --   GLUCOSE 122* 122* 136*   < > 199*  --  144*  --  184* 202*  --   --   --   --  155*  --   BUN 11 11 14    < > 14  --  12  --  12 11  --   --   --   --  9  --   CREATININE 0.67 0.71 0.74   < > 0.70  --  0.60*  --  0.50* 0.60*  --   --   --   --  0.65  --   CALCIUM 8.9 8.6* 9.0  --   --   --   --   --   --  8.7*  --   --   --   --  8.3*  --   MG  --   --   --   --   --   --   --   --   --  2.3  --   --   --   --  2.4  --    < > = values in this interval not displayed.   GFR: Estimated Creatinine Clearance: 115.4  mL/min (by C-G formula based on SCr of 0.65 mg/dL). Recent Labs  Lab 07/02/23 0227 07/02/23 1244 07/02/23 1943 07/03/23 0412  WBC 7.5 22.6* 13.4* 11.3*   Liver Function Tests: Recent Labs  Lab 06/27/23 0218 06/28/23 0227 06/29/23 0217  AST 98* 67* 34  ALT 109* 91* 63*  ALKPHOS 67 77 69  BILITOT 0.6 0.6 0.4  PROT 7.1 7.2 7.1  ALBUMIN 2.5* 2.6* 2.4*   No results for input(s): "LIPASE", "AMYLASE" in the last 168 hours. No results for input(s): "AMMONIA" in the last 168 hours.  ABG:    Component Value Date/Time   PHART 7.401 07/03/2023 0800   PCO2ART 32.6 07/03/2023 0800   PO2ART 118 (H) 07/03/2023 0800   HCO3 20.1 07/03/2023 0800   TCO2 21 (L) 07/03/2023 0800   ACIDBASEDEF 4.0 (H) 07/03/2023 0800   O2SAT 99 07/03/2023 0800    Coagulation Profile: Recent Labs  Lab 06/27/23 1604 07/02/23 1244 07/02/23 1631  INR 1.2 1.5* 1.4*   Cardiac Enzymes: No results for input(s): "CKTOTAL", "CKMB", "CKMBINDEX", "TROPONINI" in  the last 168 hours.  HbA1C: Hemoglobin A1C  Date/Time Value Ref Range Status  07/27/2020 01:51 PM 5.6 4.0 - 5.6 % Final   Hgb A1c MFr Bld  Date/Time Value Ref Range Status  06/18/2023 09:00 AM 5.9 (H) 4.8 - 5.6 % Final    Comment:    (NOTE) Pre diabetes:          5.7%-6.4%  Diabetes:              >6.4%  Glycemic control for   <7.0% adults with diabetes   01/09/2019 11:30 AM 5.4 4.8 - 5.6 % Final    Comment:             Prediabetes: 5.7 - 6.4          Diabetes: >6.4          Glycemic control for adults with diabetes: <7.0    CBG: Recent Labs  Lab 07/03/23 0408 07/03/23 0510 07/03/23 0607 07/03/23 0700 07/03/23 0757  GLUCAP 137* 154* 146* 161* 138*   Review of Systems:   Patient is encephalopathic and/or intubated; therefore, history has been obtained from chart review.   Past Medical History:  He,  has a past medical history of CAD (coronary artery disease), Dizziness (10/15/2013), HOCM (hypertrophic obstructive cardiomyopathy)  (HCC), Hypertension, Hypertriglyceridemia, Obesity, and Severe mitral regurgitation.   Surgical History:   Past Surgical History:  Procedure Laterality Date   BIOPSY  06/28/2023   Procedure: BIOPSY;  Surgeon: Benancio Deeds, MD;  Location: [facility] ENDOSCOPY;  Service: Gastroenterology;;   COLONOSCOPY WITH PROPOFOL N/A 06/28/2023   Procedure: COLONOSCOPY WITH PROPOFOL;  Surgeon: Benancio Deeds, MD;  Location: [facility] ENDOSCOPY;  Service: Gastroenterology;  Laterality: N/A;   ESOPHAGOGASTRODUODENOSCOPY (EGD) WITH PROPOFOL N/A 06/28/2023   Procedure: ESOPHAGOGASTRODUODENOSCOPY (EGD) WITH PROPOFOL;  Surgeon: Benancio Deeds, MD;  Location: [facility] ENDOSCOPY;  Service: Gastroenterology;  Laterality: N/A;   INGUINAL HERNIA REPAIR Right 02/03/2021   Procedure: HERNIA REPAIR INGUINAL ADULT;  Surgeon: Diamantina Monks, MD;  Location: [facility] OR;  Service: General;  Laterality: Right;   INSERTION OF MESH Right 02/03/2021   Procedure: INSERTION OF MESH;  Surgeon: Diamantina Monks, MD;  Location: [facility] OR;  Service: General;  Laterality: Right;   NO PAST SURGERIES     POLYPECTOMY  06/28/2023   Procedure: POLYPECTOMY;  Surgeon: Benancio Deeds, MD;  Location: [facility] ENDOSCOPY;  Service: Gastroenterology;;   RIGHT/LEFT HEART CATH AND CORONARY ANGIOGRAPHY N/A 06/24/2023   Procedure: RIGHT/LEFT HEART CATH AND CORONARY ANGIOGRAPHY;  Surgeon: Yvonne Kendall, MD;  Location: [facility] INVASIVE CV LAB;  Service: Cardiovascular;  Laterality: N/A;   SUBMUCOSAL LIFTING INJECTION  06/28/2023   Procedure: SUBMUCOSAL LIFTING INJECTION;  Surgeon: Benancio Deeds, MD;  Location: [facility] ENDOSCOPY;  Service: Gastroenterology;;   SUBMUCOSAL TATTOO INJECTION  06/28/2023   Procedure: SUBMUCOSAL TATTOO INJECTION;  Surgeon: Benancio Deeds, MD;  Location: [facility] ENDOSCOPY;  Service: Gastroenterology;;   TRANSESOPHAGEAL ECHOCARDIOGRAM (CATH LAB) N/A 06/21/2023   Procedure: TRANSESOPHAGEAL ECHOCARDIOGRAM;  Surgeon: Sande Rives, MD;   Location: [facility] INVASIVE CV LAB;  Service: Cardiovascular;  Laterality: N/A;    Social History:   reports that he has never smoked. His smokeless tobacco use includes chew. He reports current alcohol use. He reports that he does not use drugs.   Family History:  His family history includes Aneurysm in his mother; CAD in his mother; Lung disease in his mother; Other in his father.   Allergies: Allergies  Allergen Reactions   Iodinated  Contrast Media Itching   Gadolinium Derivatives    Home Medications: Prior to Admission medications   Medication Sig Start Date End Date Taking? Authorizing Provider  acetaminophen (TYLENOL) 500 MG tablet Take 1,000 mg by mouth in the morning, at noon, and at bedtime.   Yes [provider]  aspirin 81 MG EC tablet Take 1 tablet (81 mg total) by mouth daily. 07/27/20  Yes Claiborne Rigg, NP  meloxicam (MOBIC) 15 MG tablet Take 1 tablet (15 mg total) by mouth daily. Patient not taking: Reported on 06/18/2023 05/23/23   Juanda Chance, NP  methocarbamol (ROBAXIN) 500 MG tablet Take 1 tablet (500 mg total) by mouth 3 (three) times daily. Patient not taking: Reported on 06/18/2023 05/23/23   Juanda Chance, NP  amLODipine (NORVASC) 10 MG tablet Take 1 tablet (10 mg total) by mouth daily. Patient not taking: Reported on 06/18/2023 03/22/23   Gwyneth Sprout, MD  diclofenac (VOLTAREN) 75 MG EC tablet Take 1 tablet (75 mg total) by mouth 2 (two) times daily. Patient not taking: Reported on 06/18/2023 05/07/23   Storm Frisk, MD  furosemide (LASIX) 20 MG tablet Take 1 tablet (20 mg total) by mouth daily. 06/08/23   Claiborne Rigg, NP  gabapentin (NEURONTIN) 300 MG capsule Take 1 capsule (300 mg total) by mouth 3 (three) times daily. Patient not taking: Reported on 06/18/2023 05/07/23   Storm Frisk, MD  gemfibrozil (LOPID) 600 MG tablet Take 1 tablet (600 mg total) by mouth 2 (two) times daily before a meal. Patient not taking: Reported on  06/18/2023 03/22/23   Gwyneth Sprout, MD  losartan (COZAAR) 50 MG tablet Take 1 tablet (50 mg total) by mouth daily. Patient not taking: Reported on 06/18/2023 10/15/22   Hoy Register, MD  predniSONE (DELTASONE) 50 MG tablet Take 1 tablet (50 mg total) by mouth daily with breakfast. Take until completed. Patient not taking: Reported on 06/18/2023 05/15/23   Juanda Chance, NP  rosuvastatin (CRESTOR) 20 MG tablet Take 1 tablet (20 mg total) by mouth daily. Patient not taking: Reported on 06/18/2023 03/22/23   Gwyneth Sprout, MD  tiZANidine (ZANAFLEX) 4 MG tablet Take 1 tablet (4 mg total) by mouth every 6 (six) hours as needed for muscle spasms. Patient not taking: Reported on 06/18/2023 05/07/23   Storm Frisk, MD   Critical care time: 60    Lenard Galloway [facility] 07/03/23 8:57 AM  Please see [email or web address] for pager details.  From 7A-7P if no response, please call [phone] After hours, please call ELink [phone]

## 2023-07-03 NOTE — Progress Notes (Signed)
[facility]  Date of Admission:  06/17/2023     Total days of antibiotics 15         ASSESSMENT:  Mr. 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 Discussed plan of care for continuation of antibiotics with Penicillin G for 4 weeks from surgery. Valve cultures remain pending with blood cultures that cleared on 06/20/23. PICC line in place. Will place OPAT/Home Health orders for when he is ready to be discharged. Follow up in ID clinic or hospital if remains inpatient. Post-operative and remaining medical and supportive care per CVTS and PCCM.   PLAN:  Continue current dose of Penicillin G with end of treatment being 07/30/23. OPAT/Home Health orders. Monitor surgical culture for organism growth.  Continue PICC line care per protocol.  Follow up in ID clinic Post-operative and remaining medical and supportive care per CVTS and [facility].  ID will sign off. Please re-consult if needed.   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   Culture Result: Streptococcus salivarius   Allergies  Allergen Reactions   Iodinated Contrast Media Itching   Gadolinium Derivatives     OPAT Orders Discharge antibiotics to be given via PICC line Discharge antibiotics: Continuous Penicillin G  Per pharmacy protocol  Aim for Vancomycin trough 15-20 or AUC 400-550 (unless otherwise indicated) Duration: 4 weeks from surgery  End Date: 07/30/23   [facility] Care Per Protocol:  Home health RN for IV administration and teaching; PICC line care and labs.    Labs weekly while on IV antibiotics: _X_ CBC with differential _X_ BMP __ CMP _X_ CRP _X_ ESR __ Vancomycin trough __ CK  __ Please pull PIC at completion of IV antibiotics _X_ Please leave PIC in place until doctor has seen patient or been  notified  Fax weekly labs to [phone]  Clinic Follow Up Appt:  07/30/23 with Dr. Luciana Axe @ 11:15 am.   Principal Problem:   Shortness of breath Active Problems:   HTN (hypertension)   Lumbar back pain with radiculopathy affecting lower extremity   Acute heart failure with preserved ejection fraction (HCC)   Severe mitral regurgitation   Subacute bacterial endocarditis   Iron deficiency anemia   Abnormal liver diagnostic imaging   Elevated liver enzymes   Benign neoplasm of colon   S/P mitral valve replacement   Heart failure (HCC)    acetaminophen  1,000 mg Oral Q6H   Or   acetaminophen (TYLENOL) oral liquid 160 mg/5 mL  1,000 mg Per Tube Q6H   acetaminophen (TYLENOL) oral liquid 160 mg/5 mL  650 mg Per Tube Once   amLODipine  5 mg Oral Daily   aspirin EC  325 mg Oral Daily   Or   aspirin  324 mg Per Tube Daily   bisacodyl  10 mg Oral Daily   Or   bisacodyl  10 mg Rectal Daily   Chlorhexidine Gluconate Cloth  6 each Topical Daily   docusate sodium  200 mg Oral Daily   enoxaparin (LOVENOX) injection  40 mg Subcutaneous Q24H   fluconazole  200 mg Oral Daily   folic acid  1 mg Oral Daily   gabapentin  300 mg Oral TID   insulin aspart  0-15 Units Subcutaneous Q4H   methocarbamol  500 mg Oral TID   metoCLOPramide (REGLAN) injection  10 mg Intravenous Q6H  metoprolol tartrate  12.5 mg Oral BID   Or   metoprolol tartrate  12.5 mg Per Tube BID   multivitamin with minerals  1 tablet Oral Daily   nicotine  14 mg Transdermal Daily   [START ON 07/04/2023] pantoprazole  40 mg Oral Daily   pantoprazole (PROTONIX) IV  40 mg Intravenous QHS   sodium chloride flush  3 mL Intravenous Q12H   thiamine  100 mg Oral Daily    SUBJECTIVE:  Elevated post-operative temperature with no further acute events. Tolerating antibiotics. No new concerns/complaints.   Allergies  Allergen Reactions   Iodinated Contrast Media Itching   Gadolinium Derivatives      Review of  Systems: Review of Systems  Constitutional:  Negative for chills, fever and weight loss.  Respiratory:  Negative for cough, shortness of breath and wheezing.   Cardiovascular:  Negative for chest pain and leg swelling.  Gastrointestinal:  Negative for abdominal pain, constipation, diarrhea, nausea and vomiting.  Skin:  Negative for rash.      OBJECTIVE: Vitals:   07/03/23 1030 07/03/23 1100 07/03/23 1130 07/03/23 1200  BP: 116/69 123/71 117/72 125/64  Pulse: 89 (!) 104 87 88  Resp: (!) 39 (!) 30 (!) 28 (!) 41  Temp:   98.7 F (37.1 C)   TempSrc:   Oral   SpO2: 99% (!) 82% 93% 97%  Weight:      Height:       Body mass index is 32.46 kg/m.  Physical Exam Constitutional:      General: He is not in acute distress.    Appearance: He is well-developed.  Cardiovascular:     Rate and Rhythm: Normal rate and regular rhythm.     Heart sounds: Normal heart sounds.  Pulmonary:     Effort: Pulmonary effort is normal.     Breath sounds: Normal breath sounds.  Skin:    General: Skin is warm and dry.  Neurological:     Mental Status: He is alert and oriented to person, place, and time.  Psychiatric:        Mood and Affect: Mood normal.     Lab Results Lab Results  Component Value Date   WBC 11.3 (H) 07/03/2023   HGB 9.2 (L) 07/03/2023   HCT 27.0 (L) 07/03/2023   MCV 87.8 07/03/2023   PLT 113 (L) 07/03/2023    Lab Results  Component Value Date   CREATININE 0.65 07/03/2023   BUN 9 07/03/2023   NA 134 (L) 07/03/2023   K 4.5 07/03/2023   CL 103 07/03/2023   CO2 20 (L) 07/03/2023    Lab Results  Component Value Date   ALT 63 (H) 06/29/2023   AST 34 06/29/2023   GGT 630 (H) 01/09/2019   ALKPHOS 69 06/29/2023   BILITOT 0.4 06/29/2023     Microbiology: Recent Results (from the past 240 hour(s))  Surgical pcr screen     Status: None   Collection Time: 07/01/23  3:07 PM   Specimen: Nasal Mucosa; Nasal Swab  Result Value Ref Range Status   MRSA, PCR NEGATIVE  NEGATIVE Final   Staphylococcus aureus NEGATIVE NEGATIVE Final    Comment: (NOTE) The Xpert SA Assay (FDA approved for NASAL specimens in patients 35 years of age and older), is one component of a comprehensive surveillance program. It is not intended to diagnose infection nor to guide or monitor treatment. Performed at [facility], [address], [place], [place] [address]   Aerobic/Anaerobic Culture  w Gram Stain (surgical/deep wound)     Status: None (Preliminary result)   Collection Time: 07/02/23  9:15 AM   Specimen: Mitral Valve Leaflets; Tissue  Result Value Ref Range Status   Specimen Description TISSUE  Final   Special Requests mitral valve  Final   Gram Stain NO WBC SEEN NO ORGANISMS SEEN   Final   Culture   Final    NO GROWTH < 24 HOURS Performed at [facility], [address] [place], [place] [address]    Report Status PENDING  Incomplete     Marcos Eke, NP [facility]  07/03/2023  12:46 PM

## 2023-07-03 NOTE — Discharge Instructions (Addendum)

## 2023-07-03 NOTE — Evaluation (Signed)
Physical Therapy Evaluation Patient Details Name: Victor [name] MRN: [id number] DOB: [birth year] Today's Date: 07/03/2023  History of Present Illness  52 years old male who was admitted on 11/26 with SOB and LE edema, pt now s/p MVR on 12/10. PMH: hyperlipidemia, obesity, hypertension, and chronic back pain   Clinical Impression  Pt now s/p MVR. Pt very anxious but responded well to PT and RN directional cues. Pt able to amb from one side of bed to the other with modA, and 2nd person for line management. Pt reporting he plans on going home with his son who just bought a new home so unsure of set up. Anticipate pt to progress well. Acute PT to cont to follow.        If plan is discharge home, recommend the following: A little help with bathing/dressing/bathroom;Help with stairs or ramp for entrance;Assist for transportation   Can travel by private vehicle        Equipment Recommendations Rolling walker (2 wheels) (will cont to assess)  Recommendations for Other Services  OT consult    Functional Status Assessment Patient has had a recent decline in their functional status and demonstrates the ability to make significant improvements in function in a reasonable and predictable amount of time.     Precautions / Restrictions Precautions Precautions: Sternal Precaution Booklet Issued: No Precaution Comments: began education Restrictions Weight Bearing Restrictions: Yes RUE Weight Bearing: Non weight bearing LUE Weight Bearing: Non weight bearing Other Position/Activity Restrictions: sternal precautions      Mobility  Bed Mobility Overal bed mobility: Needs Assistance Bed Mobility: Supine to Sit Rolling: Independent, Min assist         General bed mobility comments: HOB elevated, pt able to bring LEs off EOB, mina to guide trunk while pt held heart pillow    Transfers Overall transfer level: Needs assistance Equipment used: None Transfers: Sit to/from Stand Sit to Stand:  Min assist           General transfer comment: minA to power for first time up s/p surgery, wide base of support    Ambulation/Gait Ambulation/Gait assistance: Mod assist, +2 safety/equipment Gait Distance (Feet): 10 Feet Assistive device: 1 person hand held assist Gait Pattern/deviations: Step-through pattern, Decreased stride length Gait velocity: slowed     General Gait Details: pt amb from one side of the bed to the other, as pt is POD 1, RN lead, 2nd person needed for line management and chest tube  Stairs   Stairs assistance: Contact guard assist Stair Management: One rail Left, Forwards, Alternating pattern   General stair comments: pt safe with use of rail, no pain with steps. Offered pt seated rest break after steps but he did not need it.  Wheelchair Mobility     Tilt Bed    Modified Rankin (Stroke Patients Only)       Balance Overall balance assessment: Needs assistance (very mild) Sitting-balance support: Feet supported Sitting balance-Leahy Scale: Good     Standing balance support: No upper extremity supported, During functional activity Standing balance-Leahy Scale: Fair                               Pertinent Vitals/Pain Pain Assessment Pain Assessment: Faces Faces Pain Scale: Hurts little more Pain Location: generalized with mobility at sternal incision Pain Descriptors / Indicators: Discomfort    Home Living Family/patient expects to be discharged to:: Private residence Living Arrangements: Alone Available Help at  Discharge: Friend(s);Available PRN/intermittently (reports he is going to stay with his son in [place])               Additional Comments: pt reports his son just bought a new house so he doesn't know the set up    Prior Function Prior Level of Function : Independent/Modified Independent;Working/employed;Driving             Mobility Comments: Uses a SPC for mobility due to chronic back pain ADLs  Comments: Mod I for ADL, iADL.  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     Extremity/Trunk Assessment   Upper Extremity Assessment Upper Extremity Assessment: Overall WFL for tasks assessed (however limited due to sternal precautions)    Lower Extremity Assessment Lower Extremity Assessment: Overall WFL for tasks assessed    Cervical / Trunk Assessment Cervical / Trunk Assessment: Other exceptions (sternal incision)  Communication   Communication Communication: No apparent difficulties  Cognition Arousal: Alert Behavior During Therapy: Anxious Overall Cognitive Status: Within Functional Limits for tasks assessed                                 General Comments: pt very anxious regarding breathing, asks a lot of questions but appreciative of help and direction        General Comments General comments (skin integrity, edema, etc.): VSS, RR inc to 44, verbal cues to slow breathing    Exercises     Assessment/Plan    PT Assessment Patient needs continued PT services  PT Problem List Decreased strength;Decreased activity tolerance;Decreased mobility;Cardiopulmonary status limiting activity;Impaired sensation;Pain       PT Treatment Interventions DME instruction;Gait training;Stair training;Functional mobility training;Therapeutic activities;Therapeutic exercise;Balance training;Cognitive remediation;Patient/family education    PT Goals (Current goals can be found in the Care Plan section)  Acute Rehab PT Goals 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, get back to work painting PT Goal Formulation: With patient Time For Goal Achievement: 07/17/23 Potential to Achieve Goals: Good    Frequency Min 1X/week     Co-evaluation               AM-PAC PT "6 Clicks" Mobility  Outcome Measure Help needed turning from your back to your side while in a flat bed without using bedrails?: A  Little Help needed moving from lying on your back to sitting on the side of a flat bed without using bedrails?: A Little Help needed moving to and from a bed to a chair (including a wheelchair)?: A Little Help needed standing up from a chair using your arms (e.g., wheelchair or bedside chair)?: A Little Help needed to walk in hospital room?: A Lot Help needed climbing 3-5 steps with a railing? : A Lot 6 Click Score: 16    End of Session   Activity Tolerance: Patient tolerated treatment well Patient left: with call bell/phone within reach;in chair;with nursing/sitter in room Nurse Communication: Mobility status PT Visit Diagnosis: Unsteadiness on feet (R26.81);Difficulty in walking, not elsewhere classified (R26.2);Muscle weakness (generalized) (M62.81)    Time: 1610-9604 PT Time Calculation (min) (ACUTE ONLY): 25 min   Charges:   PT Evaluation $PT Re-evaluation: 1 Re-eval PT Treatments $Gait Training: 8-22 mins PT General Charges $$ ACUTE PT VISIT: 1 Visit         Lewis Shock, PT, DPT Acute Rehabilitation Services Secure chat preferred Office #:  [phone]   Rozell Searing Brigit Doke 07/03/2023, 1:42 PM

## 2023-07-03 NOTE — Procedures (Signed)
Extubation Procedure Note  Patient Details:   Name: Victor [name] DOB: 06/28/1971 MRN: [id number]   Airway Documentation:    Vent end date: 07/03/23 Vent end time: 0226   Evaluation  O2 sats: stable throughout Complications: No apparent complications Patient did tolerate procedure well. Bilateral Breath Sounds: Clear, Diminished   Yes Patient extubated to 4L [name] per rapid wean protocol. Pt achived -23 NIF and 1.2 L VC. RN at bedside, cuff leak prior to extubation, no stridor noted post extubation.   Allena Napoleon 07/03/2023, 2:35 AM

## 2023-07-03 NOTE — Progress Notes (Signed)
[address]       [place] [address]             [phone]      1 Day Post-Op  Procedure(s) (LRB): EXPLORATION POST OPERATIVE OPEN HEART (N/A)   Total Length of Stay:  LOS: 15 days    SUBJECTIVE: Having HTN issues today Extubated   Vitals:   07/03/23 0645 07/03/23 0700  BP:  109/63  Pulse: 98 93  Resp: (!) 29 (!) 37  Temp: (!) 100.4 F (38 C) (!) 100.4 F (38 C)  SpO2: 99% 99%    Intake/Output      12/10 0701 12/11 0700 12/11 0701 12/12 0700   P.O.     I.V. (mL/kg) 4324.6 (46)    Blood 3223.4    IV Piggyback 4826.6    Total Intake(mL/kg) 12374.6 (131.6)    Urine (mL/kg/hr) 3205 (1.4)    Stool     Blood 1500    Chest Tube 1680    Total Output 6385    Net +5989.6             sodium chloride 20 mL/hr at 07/03/23 0648   sodium chloride     sodium chloride 20 mL/hr at 07/03/23 0700   sodium chloride     sodium chloride      ceFAZolin (ANCEF) IV Stopped (07/03/23 1027)   dexmedetomidine (PRECEDEX) IV infusion Stopped (07/03/23 0649)   epinephrine 1 mcg/min (07/03/23 0700)   insulin 2.8 Units/hr (07/03/23 0700)   lactated ringers     lactated ringers 20 mL/hr at 07/03/23 0700   norepinephrine (LEVOPHED) Adult infusion Stopped (07/03/23 0543)   penicillin G potassium 12 Million Units in dextrose 5 % 500 mL CONTINUOUS infusion 41.7 mL/hr at 07/03/23 0700    CBC    Component Value Date/Time   WBC 11.3 (H) 07/03/2023 0412   RBC 2.87 (L) 07/03/2023 0412   HGB 8.6 (L) 07/03/2023 0412   HGB 11.6 (L) 07/27/2020 1417   HCT 25.2 (L) 07/03/2023 0412   HCT 35.8 (L) 07/27/2020 1417   PLT 113 (L) 07/03/2023 0412   PLT 177 07/27/2020 1417   MCV 87.8 07/03/2023 0412   MCV 88 07/27/2020 1417   MCH 30.0 07/03/2023 0412   MCHC 34.1 07/03/2023 0412   RDW 15.8 (H) 07/03/2023 0412   RDW 15.2 07/27/2020 1417   LYMPHSABS 1.9 07/02/2023 0227   MONOABS 0.6 07/02/2023 0227   EOSABS 0.2 07/02/2023 0227   BASOSABS 0.0 07/02/2023 0227    CMP     Component Value Date/Time   NA 130 (L) 07/03/2023 0412   NA 140 07/27/2020 1417   K 4.6 07/03/2023 0412   CL 103 07/03/2023 0412   CO2 20 (L) 07/03/2023 0412   GLUCOSE 155 (H) 07/03/2023 0412   BUN 9 07/03/2023 0412   BUN 11 07/27/2020 1417   CREATININE 0.65 07/03/2023 0412   CREATININE 0.95 07/30/2016 0837   CALCIUM 8.3 (L) 07/03/2023 0412   PROT 7.1 06/29/2023 0217   PROT 7.8 07/27/2020 1417   ALBUMIN 2.4 (L) 06/29/2023 0217   ALBUMIN 4.3 07/27/2020 1417   AST 34 06/29/2023 0217   ALT 63 (H) 06/29/2023 0217   ALKPHOS 69 06/29/2023 0217   BILITOT 0.4 06/29/2023 0217   BILITOT 0.3 07/27/2020 1417   GFRNONAA >60 07/03/2023 0412   GFRNONAA >89 12/27/2014 0909   GFRAA 108 07/27/2020 1417   GFRAA >89 12/27/2014 0909   ABG  Component Value Date/Time   PHART 7.423 07/03/2023 0341   PCO2ART 31.2 (L) 07/03/2023 0341   PO2ART 112 (H) 07/03/2023 0341   HCO3 20.2 07/03/2023 0341   TCO2 21 (L) 07/03/2023 0341   ACIDBASEDEF 3.0 (H) 07/03/2023 0341   O2SAT 98 07/03/2023 0341   CBG (last 3)  Recent Labs    07/03/23 0510 07/03/23 0607 07/03/23 0700  GLUCAP 154* 146* 161*  EXAM Lungs: clear Card: RR no murmur Ext: warm Neuro:intact   ASSESSMENT: POD #1 SP MV replacement (mechanical) and septal myectomy, takeback for bleeding Hemodynamics Ok off all inotropes. Hypertensive. Will start BB and amilodipine DC art line and swan Lasix Will start low dose heparin tomorrow and coumadin for valve OOB to chair   Eugenio Hoes, MD 07/03/2023

## 2023-07-03 NOTE — Progress Notes (Signed)
Afternoon rounds: walked around part of unit today. Breathing is okay. Receiving lasix. No increased requirements.

## 2023-07-04 ENCOUNTER — Inpatient Hospital Stay (HOSPITAL_COMMUNITY): Payer: Medicaid Other

## 2023-07-04 ENCOUNTER — Other Ambulatory Visit: Payer: Self-pay | Admitting: Physician Assistant

## 2023-07-04 DIAGNOSIS — R0602 Shortness of breath: Secondary | ICD-10-CM | POA: Diagnosis not present

## 2023-07-04 DIAGNOSIS — J90 Pleural effusion, not elsewhere classified: Secondary | ICD-10-CM | POA: Diagnosis not present

## 2023-07-04 DIAGNOSIS — Z952 Presence of prosthetic heart valve: Secondary | ICD-10-CM

## 2023-07-04 DIAGNOSIS — R0989 Other specified symptoms and signs involving the circulatory and respiratory systems: Secondary | ICD-10-CM | POA: Diagnosis not present

## 2023-07-04 DIAGNOSIS — Z452 Encounter for adjustment and management of vascular access device: Secondary | ICD-10-CM | POA: Diagnosis not present

## 2023-07-04 LAB — BASIC METABOLIC PANEL
Anion gap: 8 (ref 5–15)
Anion gap: 10 (ref 5–15)
BUN: 12 mg/dL (ref 6–20)
BUN: 14 mg/dL (ref 6–20)
CO2: 23 mmol/L (ref 22–32)
CO2: 23 mmol/L (ref 22–32)
Calcium: 8.5 mg/dL — ABNORMAL LOW (ref 8.9–10.3)
Calcium: 8.6 mg/dL — ABNORMAL LOW (ref 8.9–10.3)
Chloride: 98 mmol/L (ref 98–111)
Chloride: 96 mmol/L — ABNORMAL LOW (ref 98–111)
Creatinine, Ser: 0.73 mg/dL (ref 0.61–1.24)
Creatinine, Ser: 0.74 mg/dL (ref 0.61–1.24)
GFR, Estimated: >60 mL/min (ref >60)
GFR, Estimated: >60 mL/min (ref >60)
Glucose, Bld: 141 mg/dL — ABNORMAL HIGH (ref 70–99)
Glucose, Bld: 149 mg/dL — ABNORMAL HIGH (ref 70–99)
Potassium: 4.5 mmol/L (ref 3.5–5.1)
Potassium: 4.4 mmol/L (ref 3.5–5.1)
Sodium: 129 mmol/L — ABNORMAL LOW (ref 135–145)
Sodium: 129 mmol/L — ABNORMAL LOW (ref 135–145)

## 2023-07-04 LAB — MAGNESIUM: Magnesium: 2.2 mg/dL (ref 1.7–2.4)

## 2023-07-04 LAB — GLUCOSE, CAPILLARY
Glucose-Capillary: 111 mg/dL — ABNORMAL HIGH (ref 70–99)
Glucose-Capillary: 112 mg/dL — ABNORMAL HIGH (ref 70–99)
Glucose-Capillary: 126 mg/dL — ABNORMAL HIGH (ref 70–99)
Glucose-Capillary: 131 mg/dL — ABNORMAL HIGH (ref 70–99)
Glucose-Capillary: 134 mg/dL — ABNORMAL HIGH (ref 70–99)
Glucose-Capillary: 167 mg/dL — ABNORMAL HIGH (ref 70–99)

## 2023-07-04 LAB — CBC
HCT: 24 % — ABNORMAL LOW (ref 39.0–52.0)
Hemoglobin: 8 g/dL — ABNORMAL LOW (ref 13.0–17.0)
MCH: 30 pg (ref 26.0–34.0)
MCHC: 33.3 g/dL (ref 30.0–36.0)
MCV: 89.9 fL (ref 80.0–100.0)
Platelets: 115 10*3/uL — ABNORMAL LOW (ref 150–400)
RBC: 2.67 MIL/uL — ABNORMAL LOW (ref 4.22–5.81)
RDW: 15.8 % — ABNORMAL HIGH (ref 11.5–15.5)
WBC: 14.6 10*3/uL — ABNORMAL HIGH (ref 4.0–10.5)
nRBC: 0 % (ref 0.0–0.2)

## 2023-07-04 LAB — PROTIME-INR
INR: 1.2 (ref 0.8–1.2)
Prothrombin Time: 15.1 s (ref 11.4–15.2)

## 2023-07-04 LAB — HEPARIN LEVEL (UNFRACTIONATED)
Heparin Unfractionated: <0.1 [IU]/mL — ABNORMAL LOW (ref 0.30–0.70)
Heparin Unfractionated: <0.1 [IU]/mL — ABNORMAL LOW (ref 0.30–0.70)

## 2023-07-04 MED ORDER — WARFARIN SODIUM 5 MG PO TABS: 5.0000 mg | ORAL_TABLET | Freq: Once | ORAL | Status: DC

## 2023-07-04 MED ORDER — AMIODARONE HCL 200 MG PO TABS
400.0000 mg | ORAL_TABLET | Freq: Two times a day (BID) | ORAL | Status: DC
  Administered 2023-07-04 – 2023-07-08 (×10): 400 mg via ORAL
  Filled 2023-07-04 (×10): qty 2

## 2023-07-04 MED ORDER — WARFARIN SODIUM 5 MG PO TABS
5.0000 mg | ORAL_TABLET | Freq: Once | ORAL | Status: AC
  Administered 2023-07-04: 5 mg via ORAL
  Filled 2023-07-04: qty 1

## 2023-07-04 MED ORDER — HEPARIN (PORCINE) 25000 UT/250ML-% IV SOLN
1800.0000 [IU]/h | INTRAVENOUS | Status: DC
Start: 2023-07-04 — End: 2023-07-08
  Administered 2023-07-04: 1000 [IU]/h via INTRAVENOUS
  Administered 2023-07-05: 1550 [IU]/h via INTRAVENOUS
  Administered 2023-07-05 – 2023-07-07 (×4): 1800 [IU]/h via INTRAVENOUS
  Filled 2023-07-04 (×6): qty 250

## 2023-07-04 MED ORDER — WARFARIN - PHARMACIST DOSING INPATIENT: Freq: Every day | Status: DC

## 2023-07-04 MED ORDER — FUROSEMIDE 10 MG/ML IJ SOLN
40.0000 mg | Freq: Once | INTRAMUSCULAR | Status: AC
  Administered 2023-07-04: 40 mg via INTRAVENOUS
  Filled 2023-07-04: qty 4

## 2023-07-04 MED ORDER — ASPIRIN 81 MG PO CHEW
81.0000 mg | CHEWABLE_TABLET | Freq: Every day | ORAL | Status: DC
  Administered 2023-07-04 – 2023-07-07 (×4): 81 mg via ORAL
  Filled 2023-07-04 (×4): qty 1

## 2023-07-04 NOTE — Plan of Care (Signed)

## 2023-07-04 NOTE — Progress Notes (Signed)
      [address]       [place] [address]             [phone]      POD # 2 MVR, resection of subaortic membrane, reexploration  BP 119/64   Pulse 91   Temp 98.1 F (36.7 C)   Resp (!) 22   Ht 5\' 7"  (1.702 m)   Wt 93.1 kg   SpO2 100%   BMI 32.15 kg/m     Intake/Output Summary (Last 24 hours) at 07/04/2023 1751 Last data filed at 07/04/2023 1700 Gross per 24 hour  Intake 991.19 ml  Output 1725 ml  Net -733.81 ml   Up in chair OT eval earlier today  Viviann Spare C. Dorris Fetch, MD [facility] [phone]

## 2023-07-04 NOTE — Anesthesia Postprocedure Evaluation (Signed)
Anesthesia Post Note  Patient: Victor [name]  Procedure(s) Performed: EXPLORATION POST OPERATIVE OPEN HEART (Chest)     Patient location during evaluation: SICU Anesthesia Type: General Level of consciousness: sedated Pain management: pain level controlled Vital Signs Assessment: post-procedure vital signs reviewed and stable Respiratory status: patient remains intubated per anesthesia plan Cardiovascular status: stable Postop Assessment: no apparent nausea or vomiting Anesthetic complications: no   No notable events documented.  Last Vitals:  Vitals:   07/04/23 0630 07/04/23 0645  BP: 136/74   Pulse: 97 95  Resp: (!) 33 (!) 35  Temp:    SpO2: 93% 94%    Last Pain:  Vitals:   07/04/23 0645  TempSrc:   PainSc: 5                  Ikran Patman S

## 2023-07-04 NOTE — Evaluation (Signed)
Occupational Therapy Evaluation Patient Details Name: Victor [name] MRN: [id number] DOB: Aug 29, 1970 Today's Date: 07/04/2023   History of Present Illness 52 years old male who was admitted on 11/26 with SOB and LE edema, pt now s/p MVR on 12/10. PMH: hyperlipidemia, obesity, hypertension, and chronic back pain   Clinical Impression   Pt now s/p MVR, so re-eval performed. Pt with decreased activity tolerance and strength as well as knowledge of precautions. Pt needing up to Min A for UB and LB ADL as well as transfers but able to mobilize with CGA with UE support. Will continue to follow acutely but do not suspect need for follow up other than cardiac rehab after discharge.        If plan is discharge home, recommend the following: Assist for transportation    Functional Status Assessment  Patient has had a recent decline in their functional status and demonstrates the ability to make significant improvements in function in a reasonable and predictable amount of time.  Equipment Recommendations  None recommended by OT    Recommendations for Other Services       Precautions / Restrictions Precautions Precautions: Sternal Precaution Booklet Issued: Yes (comment) Precaution Comments: All precautions reviewed within the context of ADL Restrictions Weight Bearing Restrictions Per Provider Order: Yes RUE Weight Bearing Per Provider Order: Non weight bearing LUE Weight Bearing Per Provider Order: Non weight bearing Other Position/Activity Restrictions: sternal precautions      Mobility Bed Mobility Overal bed mobility: Needs Assistance Bed Mobility: Rolling, Sit to Sidelying Rolling: Min assist       Sit to sidelying: Min assist General bed mobility comments: min A to bring BLE back into bed and reposition    Transfers Overall transfer level: Needs assistance Equipment used: None (EVA) Transfers: Sit to/from Stand Sit to Stand: Min assist           General transfer  comment: for power up and steady      Balance Overall balance assessment: Needs assistance Sitting-balance support: Feet supported Sitting balance-Leahy Scale: Good     Standing balance support: No upper extremity supported, During functional activity Standing balance-Leahy Scale: Fair                             ADL either performed or assessed with clinical judgement   ADL Overall ADL's : Needs assistance/impaired Eating/Feeding: Modified independent   Grooming: Set up;Sitting   Upper Body Bathing: Minimal assistance;Sitting   Lower Body Bathing: Minimal assistance;Sitting/lateral leans   Upper Body Dressing : Minimal assistance;Sitting   Lower Body Dressing: Minimal assistance;Sit to/from stand   Toilet Transfer: Minimal assistance;Ambulation Toilet Transfer Details (indicate cue type and reason): EVA           General ADL Comments: reviewed compensatory technique for UB dressing and bed mobility. will continue to educate during acute stay     Vision Baseline Vision/History: 1 Wears glasses Ability to See in Adequate Light: 0 Adequate Patient Visual Report: No change from baseline Vision Assessment?: No apparent visual deficits Additional Comments: not formally assessed     Perception Perception: Not tested       Praxis Praxis: Not tested       Pertinent Vitals/Pain Pain Assessment Pain Assessment: Faces Faces Pain Scale: Hurts little more Pain Location: generalized with mobility at sternal incision Pain Descriptors / Indicators: Discomfort Pain Intervention(s): Limited activity within patient's tolerance, Monitored during session     Extremity/Trunk Assessment Upper  Extremity Assessment Upper Extremity Assessment: Generalized weakness (limited due to sternal precautions)   Lower Extremity Assessment Lower Extremity Assessment: Defer to PT evaluation   Cervical / Trunk Assessment Cervical / Trunk Assessment: Other  exceptions Cervical / Trunk Exceptions: sternal incision   Communication Communication Communication: No apparent difficulties   Cognition Arousal: Alert Behavior During Therapy: WFL for tasks assessed/performed Overall Cognitive Status: Within Functional Limits for tasks assessed                                 General Comments: potential decreased STM with acute hospitalization but overall WFL for BADL. Not formally assessed     General Comments  VSS.  RR up to 40 with mobility    Exercises     Shoulder Instructions      Home Living Family/patient expects to be discharged to:: Private residence Living Arrangements: Alone Available Help at Discharge: Friend(s);Available PRN/intermittently (reports he is going to stay with his son in [place]) Type of Home: House Home Access: Stairs to enter [employer] of Steps: 4 Entrance Stairs-Rails: Right;Left Home Layout: One level     Bathroom Shower/Tub: Chief Strategy Officer: Standard Bathroom Accessibility: Yes How Accessible: Accessible via walker Home Equipment: Cane - single point   Additional Comments: pt reports his son just bought a new house so he doesn't know the set up      Prior Functioning/Environment Prior Level of Function : Independent/Modified Independent;Working/employed;Driving             Mobility Comments: Uses a SPC for mobility due to chronic back pain ADLs Comments: Mod I for ADL, iADL.  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        OT Problem List: Decreased activity tolerance;Impaired balance (sitting and/or standing);Pain      OT Treatment/Interventions: Self-care/ADL training;Therapeutic activities;Balance training;Energy conservation    OT Goals(Current goals can be found in the care plan section) Acute Rehab OT Goals 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 OT Goal Formulation: With  patient Time For Goal Achievement: 07/18/23 Potential to Achieve Goals: Good  OT Frequency: Min 1X/week    Co-evaluation              AM-PAC OT "6 Clicks" Daily Activity     Outcome Measure Help from another person eating meals?: None Help from another person taking care of personal grooming?: A Little Help from another person toileting, which includes using toliet, bedpan, or urinal?: A Little Help from another person bathing (including washing, rinsing, drying)?: A Little Help from another person to put on and taking off regular upper body clothing?: A Little Help from another person to put on and taking off regular lower body clothing?: A Little 6 Click Score: 19   End of Session Equipment Utilized During Treatment: Gait belt;Oxygen (EVA) Nurse Communication: Mobility status  Activity Tolerance: Patient tolerated treatment well Patient left: in bed;with call bell/phone within reach  OT Visit Diagnosis: Unsteadiness on feet (R26.81)                Time: 1610-9604 OT Time Calculation (min): 35 min Charges:  OT General Charges $OT Visit: 1 Visit OT Evaluation $OT Re-eval: 1 Re-eval OT Treatments $Self Care/Home Management : 8-22 mins  Tyler Deis, OTR/L [facility] Acute Rehabilitation Office: [phone]   Myrla Halsted 07/04/2023, 1:15 PM

## 2023-07-04 NOTE — Progress Notes (Signed)
ANTICOAGULATION CONSULT NOTE  Pharmacy Consult for heparin no bolus + warfarin Indication: s/p mechanical MVR 12/10  Allergies  Allergen Reactions   Iodinated Contrast Media Itching   Gadolinium Derivatives     Patient Measurements: Height: 5\' 7"  (170.2 cm) Weight: 93.1 kg (205 lb 4 oz) IBW/kg (Calculated) : 66.1 Heparin Dosing Weight: 83 kg  Vital Signs: Temp: 98.1 F (36.7 C) (12/12 1630) Temp Source: Oral (12/12 1142) BP: 119/72 (12/12 1800) Pulse Rate: 134 (12/12 1800)  Labs: Recent Labs    07/02/23 1244 07/02/23 1408 07/02/23 1631 07/02/23 1738 07/03/23 0412 07/03/23 0800 07/03/23 1603 07/04/23 0518 07/04/23 1529 07/04/23 1530 07/04/23 1825  HGB 7.7*   < >  --    < > 8.6* 9.2* 9.0* 8.0*  --   --   --   HCT 24.2*   < >  --    < > 25.2* 27.0* 27.4* 24.0*  --   --   --   PLT 143*  --  127*   < > 113*  --  124* 115*  --   --   --   APTT 36  --  28  --   --   --   --   --   --   --   --   LABPROT 17.9*  --  17.2*  --   --   --   --   --   --  15.1  --   INR 1.5*  --  1.4*  --   --   --   --   --   --  1.2  --   HEPARINUNFRC  --   --   --   --   --   --   --   --  <0.10*  --  <0.10*  CREATININE  --    < >  --    < > 0.65  --  0.81 0.73  --   --   --    < > = values in this interval not displayed.    Estimated Creatinine Clearance: 114.8 mL/min (by C-G formula based on SCr of 0.73 mg/dL).  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  Not on anticoagulation prior to admission.  Pharmacy consulted  for heparin and warfarin dosing POD#2.  Of note, D#7/14 of fluconazole and started on postop amio ppx today which will both reduce warfarin metabolism / enhance anticoagulant effect.  Postop anemia - Hgb 8.0, pltc 115.  Baseline INR 1.2.   Goal of Therapy:  INR 2.5-3.5 Heparin level 0.3-0.7 units/ml Monitor platelets by anticoagulation protocol: Yes  2nd shift update: Heparin level subtherapeutic at <0.10. RN confirmed there were no issues with line and no pauses in infusion.    Plan:  Increase heparin infusion to 1400 units/hr 6 hour heparin level  Warfarin 5 mg PO x 1  Daily CBC, heparin level Monitor for s/sx of bleeding  Cedric Fishman, PharmD, BCPS, BCCCP Clinical Pharmacist

## 2023-07-04 NOTE — Progress Notes (Signed)
[address]       [place] [address]             [phone]      2 Days Post-Op  Procedure(s) (LRB): EXPLORATION POST OPERATIVE OPEN HEART (N/A)   Total Length of Stay:  LOS: 16 days    SUBJECTIVE: No issues overnight  Vitals:   07/04/23 0645 07/04/23 0700  BP:  132/70  Pulse: 95 95  Resp: (!) 35 (!) 26  Temp:    SpO2: 94% 94%    Intake/Output      12/11 0701 12/12 0700 12/12 0701 12/13 0700   I.V. (mL/kg) 299.4 (3.2)    Blood     IV Piggyback 1236.2    Total Intake(mL/kg) 1535.6 (16.5)    Urine (mL/kg/hr) 1410 (0.6)    Blood     Chest Tube 330    Total Output 1740    Net -204.4              ceFAZolin (ANCEF) IV 200 mL/hr at 07/04/23 0700   dexmedetomidine (PRECEDEX) IV infusion Stopped (07/03/23 0649)   epinephrine Stopped (07/03/23 0714)   norepinephrine (LEVOPHED) Adult infusion Stopped (07/03/23 0543)   penicillin G potassium 12 Million Units in dextrose 5 % 500 mL CONTINUOUS infusion 41.7 mL/hr at 07/04/23 0700    CBC    Component Value Date/Time   WBC 14.6 (H) 07/04/2023 0518   RBC 2.67 (L) 07/04/2023 0518   HGB 8.0 (L) 07/04/2023 0518   HGB 11.6 (L) 07/27/2020 1417   HCT 24.0 (L) 07/04/2023 0518   HCT 35.8 (L) 07/27/2020 1417   PLT 115 (L) 07/04/2023 0518   PLT 177 07/27/2020 1417   MCV 89.9 07/04/2023 0518   MCV 88 07/27/2020 1417   MCH 30.0 07/04/2023 0518   MCHC 33.3 07/04/2023 0518   RDW 15.8 (H) 07/04/2023 0518   RDW 15.2 07/27/2020 1417   LYMPHSABS 1.9 07/02/2023 0227   MONOABS 0.6 07/02/2023 0227   EOSABS 0.2 07/02/2023 0227   BASOSABS 0.0 07/02/2023 0227   CMP     Component Value Date/Time   NA 129 (L) 07/04/2023 0518   NA 140 07/27/2020 1417   K 4.5 07/04/2023 0518   CL 98 07/04/2023 0518   CO2 23 07/04/2023 0518   GLUCOSE 141 (H) 07/04/2023 0518   BUN 12 07/04/2023 0518   BUN 11 07/27/2020 1417   CREATININE 0.73 07/04/2023 0518   CREATININE 0.95 07/30/2016 0837   CALCIUM 8.5 (L)  07/04/2023 0518   PROT 7.1 06/29/2023 0217   PROT 7.8 07/27/2020 1417   ALBUMIN 2.4 (L) 06/29/2023 0217   ALBUMIN 4.3 07/27/2020 1417   AST 34 06/29/2023 0217   ALT 63 (H) 06/29/2023 0217   ALKPHOS 69 06/29/2023 0217   BILITOT 0.4 06/29/2023 0217   BILITOT 0.3 07/27/2020 1417   GFRNONAA >60 07/04/2023 0518   GFRNONAA >89 12/27/2014 0909   GFRAA 108 07/27/2020 1417   GFRAA >89 12/27/2014 0909   ABG    Component Value Date/Time   PHART 7.401 07/03/2023 0800   PCO2ART 32.6 07/03/2023 0800   PO2ART 118 (H) 07/03/2023 0800   HCO3 20.1 07/03/2023 0800   TCO2 21 (L) 07/03/2023 0800   ACIDBASEDEF 4.0 (H) 07/03/2023 0800   O2SAT 99 07/03/2023 0800   CBG (last 3)  Recent Labs    07/03/23 2034 07/03/23 2354 07/04/23 0415  GLUCAP 141* 151* 126*  EXAM Lungs: decreased at bases Card: RR  Ext: warm Neuro: intact   ASSESSMENT: POD #2 SP MVR (mechanical) and Septal myectomy Hemodynamics good. Diurese today Remove pw, chest tubes, foley Start low dose heparin today Start coumadin tonite Leave in unit for mobilization optimization   Eugenio Hoes, MD 07/04/2023

## 2023-07-04 NOTE — Progress Notes (Signed)
NAME:  Victor [name], MRN:  [id number], DOB:  May 14, 1971, LOS: 16 ADMISSION DATE:  06/17/2023 CONSULTATION DATE:  07/02/2023 REFERRING MD:  Leafy Ro - TCTS CHIEF COMPLAINT:  Postoperative vent management   History of Present Illness:  Victor [name] is seen in consultation at the request of Dr. Leafy Ro (TCTS) for management of mechanical ventilation and hemodynamics post-MVR.  52 year old man who initially presented to [facility] 11/25 for SOB, orthopnea, BLE edema and back pain x 2 weeks.  PMHx significant for HTN, HLD, nonobstructive CAD, diastolic HFpEF (Echo 05/2023 with EF 65-70%, G2DD, +SAM with subaortic membrane of LVOT, degenerative MV with calcified anterior/posterior leaflets), obesity, EtOH use, low back pain. 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 [facility] 06/2023 demonstrated nonobstructive CAD, severely elevated L heart and PA pressures, normal CO/CI; possible HOCM. cMRI 06/2023 showed small pericardial effusion, suspected LV outflow gradient, +SAM, moderate to severe MR, noncoronary LGE c/w HOCM/infiltrative disease.  Underwent MVR 12/10 with Dr. 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   PCCM consulted for post-operative ventilator management.  Pertinent Medical History:   Past Medical History:  Diagnosis Date   CAD (coronary artery disease)    Nonobstructive on [facility] 06/2023   Dizziness 10/15/2013    "SWIMMY HEAD    "   HOCM (hypertrophic obstructive cardiomyopathy) (HCC)    Hypertension    Hypertriglyceridemia    Obesity    Severe mitral regurgitation    Significant Hospital Events: Including procedures, antibiotic start and stop dates in addition to other pertinent events   12/10: MVR. CCM consulted for MV/hemodynamics. 12/11: extubated early AM POD 1. Pressors off. Tachypneic with small breaths.  +diuretics. Dc swan and a-line  Interim History / Subjective:  Up in chair States he has been walking some down halls On 4L [place] UOP 1.4 L last 24 hours w/ lasix Pulling about 400 on IS  Objective:  Blood pressure 132/70, pulse 95, temperature 99.2 F (37.3 C), temperature source Oral, resp. rate (!) 26, height 5\' 7"  (1.702 m), weight 93.1 kg, SpO2 94%. PAP: (29-34)/(23-26) 31/26 CVP:  [14 mmHg-24 mmHg] 15 mmHg CO:  [7.3 L/min] 7.3 L/min CI:  [3.7 L/min/m2] 3.7 L/min/m2      Intake/Output Summary (Last 24 hours) at 07/04/2023 0720 Last data filed at 07/04/2023 0700 Gross per 24 hour  Intake 1535.6 ml  Output 1740 ml  Net -204.4 ml   Filed Weights   07/02/23 0340 07/03/23 0500 07/04/23 0500  Weight: 87 kg 94 kg 93.1 kg   Physical Examination: General:  NAD HEENT: MM pink/moist; [place] in place Neuro: Aox3; MAE CV: s1s2, RRR, no m/r/g; CT in place PULM:  dim clear BS bilaterally; [place] 4L GI: soft, bsx4 active  Extremities: warm/dry, no edema  Skin: no rashes or lesions    Resolved Hospital Problem List:  Post-operative vent management   Assessment & Plan:  Post operative vent management, resolved  Post-operative atelectasis; extubated 12/11 early AM. On 4LNC. 250 on IS. Shallow, tachypneic breaths. ABG this AM unremarkable. ?Anxiety  Plan: -wean [place] for sats >92% -aggressive pulm toiletry: IS/flutter -oob, pt, ot -diuresis per TCTS  S/p MVR with St. 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  [facility] 06/2023 +nonobstructive CAD, severely elevated L heart and PA pressures, normal CO/CI; possible HOCM. cMRI 06/2023 showed small pericardial effusion, suspected LV outflow gradient, +SAM, moderate to severe MR, noncoronary  LGE c/w HOCM/infiltrative disease. Plan: - POD #2; Postoperative care per TCTS - lasix x1 yesterday w/ 1.4L UOP; consider repeat dose today - cont metoprolol - cont amio - plan to start on low dose heparin today and switch to coumadin later tonight - plan to pull foley and CT today - ASA  - pain management - check LFTs and consider adding statin - ID following; cont iv pcn 4 weeks post surgery - will need picc line and then can likely pull CVL  ABLA in the setting of cardiac surgery Thrombocytopenia - Protamine x 1 ordered for heparin reversal, given copious CT output first 1 hour post-op - output okay now Plan: -trend cbc  Hyponatremia -likely hypervolemia Plan: -diuresis per TCTS  EtOH use, daily Plan: -thiamine, folic acid, mvi -ciwa  Tobacco use  Plan: -nicotine patch  Best Practice: (right click and "Reselect all SmartList Selections" daily)   Diet/type: clear liquids DVT prophylaxis: systemic heparin GI prophylaxis: PPI Lines: Central line Foley:  removal ordered  per tcts Code Status:  full code Last date of multidisciplinary goals of care discussion [Per TCTS]  Labs:  CBC: Recent Labs  Lab 06/28/23 0227 06/29/23 0217 06/30/23 0310 07/01/23 0236 07/02/23 0227 07/02/23 0755 07/02/23 1244 07/02/23 1408 07/02/23 1631 07/02/23 1738 07/02/23 1943 07/02/23 2329 07/03/23 0341 07/03/23 0412 07/03/23 0800 07/03/23 1603 07/04/23 0518  WBC 9.8 9.6 8.6 8.0 7.5  --  22.6*  --   --   --  13.4*  --   --  11.3*  --  16.0* 14.6*  NEUTROABS 6.9 6.6 5.5 4.8 4.7  --   --   --   --   --   --   --   --   --   --   --   --   HGB 8.3* 8.0* 8.0* 7.8* 7.7*   < > 7.7*   < >  --    < > 8.8*   < > 8.2* 8.6* 9.2* 9.0* 8.0*  HCT 26.9* 26.1* 25.7* 24.9* 24.6*   < > 24.2*   < >  --    < > 26.0*   < > 24.0* 25.2* 27.0* 27.4* 24.0*  MCV 92.4 93.2 92.8 91.9 91.4  --  92.0  --   --   --  88.4  --   --  87.8  --  90.4 89.9  PLT 242 234 218 218 200   < > 143*  --  127*  --  108*   --   --  113*  --  124* 115*   < > = values in this interval not displayed.   Basic Metabolic Panel: Recent Labs  Lab 07/02/23 0227 07/02/23 0755 07/02/23 1742 07/02/23 1943 07/02/23 2329 07/03/23 0341 07/03/23 0412 07/03/23 0800 07/03/23 1603 07/04/23 0518  NA 133*   < > 135 131*   < > 135 130* 134* 129* 129*  K 4.1   < > 5.5* 5.1   < > 4.7 4.6 4.5 4.4 4.5  CL 100   < > 100 102  --   --  103  --  98 98  CO2 24  --   --  21*  --   --  20*  --  20* 23  GLUCOSE 136*   < > 184*  202*  --   --  155*  --  184* 141*  BUN 14   < > 12 11  --   --  9  --  12 12  CREATININE 0.74   < > 0.50* 0.60*  --   --  0.65  --  0.81 0.73  CALCIUM 9.0  --   --  8.7*  --   --  8.3*  --  8.8* 8.5*  MG  --   --   --  2.3  --   --  2.4  --  2.2 2.2   < > = values in this interval not displayed.   GFR: Estimated Creatinine Clearance: 114.8 mL/min (by C-G formula based on SCr of 0.73 mg/dL). Recent Labs  Lab 07/02/23 1943 07/03/23 0412 07/03/23 1603 07/04/23 0518  WBC 13.4* 11.3* 16.0* 14.6*   Liver Function Tests: Recent Labs  Lab 06/28/23 0227 06/29/23 0217  AST 67* 34  ALT 91* 63*  ALKPHOS 77 69  BILITOT 0.6 0.4  PROT 7.2 7.1  ALBUMIN 2.6* 2.4*   No results for input(s): "LIPASE", "AMYLASE" in the last 168 hours. No results for input(s): "AMMONIA" in the last 168 hours.  ABG:    Component Value Date/Time   PHART 7.401 07/03/2023 0800   PCO2ART 32.6 07/03/2023 0800   PO2ART 118 (H) 07/03/2023 0800   HCO3 20.1 07/03/2023 0800   TCO2 21 (L) 07/03/2023 0800   ACIDBASEDEF 4.0 (H) 07/03/2023 0800   O2SAT 99 07/03/2023 0800    Coagulation Profile: Recent Labs  Lab 06/27/23 1604 07/02/23 1244 07/02/23 1631  INR 1.2 1.5* 1.4*   Cardiac Enzymes: No results for input(s): "CKTOTAL", "CKMB", "CKMBINDEX", "TROPONINI" in the last 168 hours.  HbA1C: Hemoglobin A1C  Date/Time Value Ref Range Status  07/27/2020 01:51 PM 5.6 4.0 - 5.6 % Final   Hgb A1c MFr Bld  Date/Time Value Ref  Range Status  06/18/2023 09:00 AM 5.9 (H) 4.8 - 5.6 % Final    Comment:    (NOTE) Pre diabetes:          5.7%-6.4%  Diabetes:              >6.4%  Glycemic control for   <7.0% adults with diabetes   01/09/2019 11:30 AM 5.4 4.8 - 5.6 % Final    Comment:             Prediabetes: 5.7 - 6.4          Diabetes: >6.4          Glycemic control for adults with diabetes: <7.0    CBG: Recent Labs  Lab 07/03/23 1138 07/03/23 1608 07/03/23 2034 07/03/23 2354 07/04/23 0415  GLUCAP 132* 173* 141* 151* 126*   Review of Systems:   Patient is encephalopathic and/or intubated; therefore, history has been obtained from chart review.   Past Medical History:  He,  has a past medical history of CAD (coronary artery disease), Dizziness (10/15/2013), HOCM (hypertrophic obstructive cardiomyopathy) (HCC), Hypertension, Hypertriglyceridemia, Obesity, and Severe mitral regurgitation.   Surgical History:   Past Surgical History:  Procedure Laterality Date   BIOPSY  06/28/2023   Procedure: BIOPSY;  Surgeon: Benancio Deeds, MD;  Location: [facility] ENDOSCOPY;  Service: Gastroenterology;;   COLONOSCOPY WITH PROPOFOL N/A 06/28/2023   Procedure: COLONOSCOPY WITH PROPOFOL;  Surgeon: Benancio Deeds, MD;  Location: [facility] ENDOSCOPY;  Service: Gastroenterology;  Laterality: N/A;   ESOPHAGOGASTRODUODENOSCOPY (EGD) WITH PROPOFOL N/A 06/28/2023   Procedure: ESOPHAGOGASTRODUODENOSCOPY (EGD) WITH  PROPOFOL;  Surgeon: Benancio Deeds, MD;  Location: [facility] ENDOSCOPY;  Service: Gastroenterology;  Laterality: N/A;   EXPLORATION POST OPERATIVE OPEN HEART N/A 07/02/2023   Procedure: EXPLORATION POST OPERATIVE OPEN HEART;  Surgeon: Eugenio Hoes, MD;  Location[facility] OR;  Service: Open Heart Surgery;  Laterality: N/A;   INGUINAL HERNIA REPAIR Right 02/03/2021   Procedure: HERNIA REPAIR INGUINAL ADULT;  Surgeon: Diamantina Monks, MD;  Location: [facility] OR;  Service: General;  Laterality: Right;   INSERTION OF MESH Right 02/03/2021    Procedure: INSERTION OF MESH;  Surgeon: Diamantina Monks, MD;  Location: [facility] OR;  Service: General;  Laterality: Right;   MITRAL VALVE REPLACEMENT N/A 07/02/2023   Procedure: MITRAL VALVE (MV) REPLACEMENT USING 29 MM SJM MASTES SERIES MECHANICAL HEART VALVE, SUBAORTIC MYECTOMY;  Surgeon: Eugenio Hoes, MD;  Location: [facility] OR;  Service: Open Heart Surgery;  Laterality: N/A;   NO PAST SURGERIES     POLYPECTOMY  06/28/2023   Procedure: POLYPECTOMY;  Surgeon: Benancio Deeds, MD;  Location: [facility] ENDOSCOPY;  Service: Gastroenterology;;   RIGHT/LEFT HEART CATH AND CORONARY ANGIOGRAPHY N/A 06/24/2023   Procedure: RIGHT/LEFT HEART CATH AND CORONARY ANGIOGRAPHY;  Surgeon: Yvonne Kendall, MD;  Location: [facility] INVASIVE CV LAB;  Service: Cardiovascular;  Laterality: N/A;   SUBMUCOSAL LIFTING INJECTION  06/28/2023   Procedure: SUBMUCOSAL LIFTING INJECTION;  Surgeon: Benancio Deeds, MD;  Location: [facility] ENDOSCOPY;  Service: Gastroenterology;;   SUBMUCOSAL TATTOO INJECTION  06/28/2023   Procedure: SUBMUCOSAL TATTOO INJECTION;  Surgeon: Benancio Deeds, MD;  Location: [facility] ENDOSCOPY;  Service: Gastroenterology;;   TEE WITHOUT CARDIOVERSION N/A 07/02/2023   Procedure: TRANSESOPHAGEAL ECHOCARDIOGRAM (TEE);  Surgeon: Eugenio Hoes, MD;  Location: [facility] OR;  Service: Open Heart Surgery;  Laterality: N/A;   TRANSESOPHAGEAL ECHOCARDIOGRAM (CATH LAB) N/A 06/21/2023   Procedure: TRANSESOPHAGEAL ECHOCARDIOGRAM;  Surgeon: Sande Rives, MD;  Location: [facility] INVASIVE CV LAB;  Service: Cardiovascular;  Laterality: N/A;    Social History:   reports that he has never smoked. His smokeless tobacco use includes chew. He reports current alcohol use. He reports that he does not use drugs.   Family History:  His family history includes Aneurysm in his mother; CAD in his mother; Lung disease in his mother; Other in his father.   Allergies: Allergies  Allergen Reactions   Iodinated Contrast Media Itching   Gadolinium  Derivatives    Home Medications: Prior to Admission medications   Medication Sig Start Date End Date Taking? Authorizing Provider  acetaminophen (TYLENOL) 500 MG tablet Take 1,000 mg by mouth in the morning, at noon, and at bedtime.   Yes [provider]  aspirin 81 MG EC tablet Take 1 tablet (81 mg total) by mouth daily. 07/27/20  Yes Claiborne Rigg, NP  meloxicam (MOBIC) 15 MG tablet Take 1 tablet (15 mg total) by mouth daily. Patient not taking: Reported on 06/18/2023 05/23/23   Juanda Chance, NP  methocarbamol (ROBAXIN) 500 MG tablet Take 1 tablet (500 mg total) by mouth 3 (three) times daily. Patient not taking: Reported on 06/18/2023 05/23/23   Juanda Chance, NP  amLODipine (NORVASC) 10 MG tablet Take 1 tablet (10 mg total) by mouth daily. Patient not taking: Reported on 06/18/2023 03/22/23   Gwyneth Sprout, MD  diclofenac (VOLTAREN) 75 MG EC tablet Take 1 tablet (75 mg total) by mouth 2 (two) times daily. Patient not taking: Reported on 06/18/2023 05/07/23   Storm Frisk, MD  furosemide (LASIX) 20 MG tablet Take 1  tablet (20 mg total) by mouth daily. 06/08/23   Claiborne Rigg, NP  gabapentin (NEURONTIN) 300 MG capsule Take 1 capsule (300 mg total) by mouth 3 (three) times daily. Patient not taking: Reported on 06/18/2023 05/07/23   Storm Frisk, MD  gemfibrozil (LOPID) 600 MG tablet Take 1 tablet (600 mg total) by mouth 2 (two) times daily before a meal. Patient not taking: Reported on 06/18/2023 03/22/23   Gwyneth Sprout, MD  losartan (COZAAR) 50 MG tablet Take 1 tablet (50 mg total) by mouth daily. Patient not taking: Reported on 06/18/2023 10/15/22   Hoy Register, MD  predniSONE (DELTASONE) 50 MG tablet Take 1 tablet (50 mg total) by mouth daily with breakfast. Take until completed. Patient not taking: Reported on 06/18/2023 05/15/23   Juanda Chance, NP  rosuvastatin (CRESTOR) 20 MG tablet Take 1 tablet (20 mg total) by mouth daily. Patient  not taking: Reported on 06/18/2023 03/22/23   Gwyneth Sprout, MD  tiZANidine (ZANAFLEX) 4 MG tablet Take 1 tablet (4 mg total) by mouth every 6 (six) hours as needed for muscle spasms. Patient not taking: Reported on 06/18/2023 05/07/23   Storm Frisk, MD   Critical care time: 35 minutes    Lidia Collum, PA-C [facility] 07/04/23 7:20 AM  Please see [email or web address] for pager details.  From 7A-7P if no response, please call [phone] After hours, please call ELink [phone]

## 2023-07-04 NOTE — Progress Notes (Signed)
ANTICOAGULATION CONSULT NOTE  Pharmacy Consult for heparin no bolus + warfarin Indication: s/p mechanical MVR 12/10  Allergies  Allergen Reactions   Iodinated Contrast Media Itching   Gadolinium Derivatives     Patient Measurements: Height: 5\' 7"  (170.2 cm) Weight: 93.1 kg (205 lb 4 oz) IBW/kg (Calculated) : 66.1 Heparin Dosing Weight: 83 kg  Vital Signs: Temp: 99.2 F (37.3 C) (12/12 0417) Temp Source: Oral (12/12 0417) BP: 132/70 (12/12 0700) Pulse Rate: 95 (12/12 0700)  Labs: Recent Labs    07/02/23 1244 07/02/23 1408 07/02/23 1631 07/02/23 1738 07/03/23 0412 07/03/23 0800 07/03/23 1603 07/04/23 0518  HGB 7.7*   < >  --    < > 8.6* 9.2* 9.0* 8.0*  HCT 24.2*   < >  --    < > 25.2* 27.0* 27.4* 24.0*  PLT 143*  --  127*   < > 113*  --  124* 115*  APTT 36  --  28  --   --   --   --   --   LABPROT 17.9*  --  17.2*  --   --   --   --   --   INR 1.5*  --  1.4*  --   --   --   --   --   CREATININE  --    < >  --    < > 0.65  --  0.81 0.73   < > = values in this interval not displayed.    Estimated Creatinine Clearance: 114.8 mL/min (by C-G formula based on SCr of 0.73 mg/dL).  Medical History: Past Medical History:  Diagnosis Date   CAD (coronary artery disease)    Nonobstructive on [facility] 06/2023   Dizziness 10/15/2013    "SWIMMY HEAD    "   HOCM (hypertrophic obstructive cardiomyopathy) (HCC)    Hypertension    Hypertriglyceridemia    Obesity    Severe mitral regurgitation     Assessment: 52 yo male with strep salivarius bacteremia and native valve endocarditis with severe MR s/p mechanical MVR and septal myectomy on 12/10 and returned to OR with bleeding immediately postop.  Not on anticoagulation prior to admission.  Pharmacy consulted for heparin and warfarin dosing POD#2.  Of note, D#7/14 of fluconazole and started on postop amio ppx today which will both reduce warfarin metabolism / enhance anticoagulant effect.  Postop anemia - Hgb 8.0, pltc 115.   Baseline INR 1.2.   Goal of Therapy:  INR 2.5-3.5 Heparin level 0.3-0.7 units/ml Monitor platelets by anticoagulation protocol: Yes   Plan:  No bolus  Start heparin infusion 1000 units/hr 6 hour heparin level Warfarin 5 mg PO x 1  Daily CBC, heparin level Monitor for s/sx of bleeding  Trixie Rude, PharmD Clinical Pharmacist 07/04/2023  7:14 AM

## 2023-07-04 NOTE — Progress Notes (Signed)
Ordered echo 6 weeks post MVR

## 2023-07-04 NOTE — Addendum Note (Signed)
Addendum  created 07/04/23 6010 by April Holding, CRNA   Intraprocedure Staff edited

## 2023-07-05 ENCOUNTER — Inpatient Hospital Stay (HOSPITAL_COMMUNITY): Payer: Medicaid Other

## 2023-07-05 DIAGNOSIS — R0602 Shortness of breath: Secondary | ICD-10-CM | POA: Diagnosis not present

## 2023-07-05 DIAGNOSIS — J90 Pleural effusion, not elsewhere classified: Secondary | ICD-10-CM | POA: Diagnosis not present

## 2023-07-05 DIAGNOSIS — R0989 Other specified symptoms and signs involving the circulatory and respiratory systems: Secondary | ICD-10-CM | POA: Diagnosis not present

## 2023-07-05 LAB — ECHO INTRAOPERATIVE TEE
AV Mean grad: 11.7 mm[Hg]
AV Peak grad: 23.7 mm[Hg]
Ao pk vel: 2.43 m/s
Height: 67 in
Height: 67 in
Weight: 3068.8 [oz_av]
Weight: 3068.8 [oz_av]

## 2023-07-05 LAB — TYPE AND SCREEN
ABO/RH(D): A POS
Antibody Screen: NEGATIVE
Unit division: 0
Unit division: 0
Unit division: 0
Unit division: 0
Unit division: 0
Unit division: 0
Unit division: 0
Unit division: 0
Unit division: 0
Unit division: 0
Unit division: 0
Unit division: 0

## 2023-07-05 LAB — BPAM RBC
Blood Product Expiration Date: 202412312359
Blood Product Expiration Date: 202412312359
Blood Product Expiration Date: 202412312359
Blood Product Expiration Date: 202412312359
Blood Product Expiration Date: 202412312359
Blood Product Expiration Date: 202412312359
Blood Product Expiration Date: 202412312359
Blood Product Expiration Date: 202412312359
Blood Product Expiration Date: 202412312359
Blood Product Expiration Date: 202412312359
Blood Product Expiration Date: 202501012359
Blood Product Expiration Date: 202501012359
ISSUE DATE / TIME: 202412100745
ISSUE DATE / TIME: 202412100745
ISSUE DATE / TIME: 202412101426
ISSUE DATE / TIME: 202412101426
ISSUE DATE / TIME: 202412101549
ISSUE DATE / TIME: 202412101549
ISSUE DATE / TIME: 202412101549
ISSUE DATE / TIME: 202412101549
ISSUE DATE / TIME: 202412101557
ISSUE DATE / TIME: 202412101557
Unit Type and Rh: 6200
Unit Type and Rh: 6200
Unit Type and Rh: 6200
Unit Type and Rh: 6200
Unit Type and Rh: 6200
Unit Type and Rh: 6200
Unit Type and Rh: 6200
Unit Type and Rh: 6200
Unit Type and Rh: 6200
Unit Type and Rh: 6200
Unit Type and Rh: 6200
Unit Type and Rh: 6200

## 2023-07-05 LAB — COMPREHENSIVE METABOLIC PANEL
ALT: 17 U/L (ref 0–44)
AST: 29 U/L (ref 15–41)
Albumin: 2.5 g/dL — ABNORMAL LOW (ref 3.5–5.0)
Alkaline Phosphatase: 47 U/L (ref 38–126)
Anion gap: 10 (ref 5–15)
BUN: 12 mg/dL (ref 6–20)
CO2: 23 mmol/L (ref 22–32)
Calcium: 8.5 mg/dL — ABNORMAL LOW (ref 8.9–10.3)
Chloride: 95 mmol/L — ABNORMAL LOW (ref 98–111)
Creatinine, Ser: 0.62 mg/dL (ref 0.61–1.24)
GFR, Estimated: >60 mL/min (ref >60)
Glucose, Bld: 140 mg/dL — ABNORMAL HIGH (ref 70–99)
Potassium: 4.4 mmol/L (ref 3.5–5.1)
Sodium: 128 mmol/L — ABNORMAL LOW (ref 135–145)
Total Bilirubin: 1 mg/dL (ref <1.2)
Total Protein: 6 g/dL — ABNORMAL LOW (ref 6.5–8.1)

## 2023-07-05 LAB — GLUCOSE, CAPILLARY
Glucose-Capillary: 129 mg/dL — ABNORMAL HIGH (ref 70–99)
Glucose-Capillary: 140 mg/dL — ABNORMAL HIGH (ref 70–99)
Glucose-Capillary: 156 mg/dL — ABNORMAL HIGH (ref 70–99)
Glucose-Capillary: 111 mg/dL — ABNORMAL HIGH (ref 70–99)
Glucose-Capillary: 129 mg/dL — ABNORMAL HIGH (ref 70–99)

## 2023-07-05 LAB — HEPARIN LEVEL (UNFRACTIONATED)
Heparin Unfractionated: 0.18 [IU]/mL — ABNORMAL LOW (ref 0.30–0.70)
Heparin Unfractionated: 0.17 [IU]/mL — ABNORMAL LOW (ref 0.30–0.70)
Heparin Unfractionated: 0.22 [IU]/mL — ABNORMAL LOW (ref 0.30–0.70)
Heparin Unfractionated: 0.29 [IU]/mL — ABNORMAL LOW (ref 0.30–0.70)

## 2023-07-05 LAB — CBC
HCT: 21.2 % — ABNORMAL LOW (ref 39.0–52.0)
Hemoglobin: 6.9 g/dL — CL (ref 13.0–17.0)
MCH: 30 pg (ref 26.0–34.0)
MCHC: 32.5 g/dL (ref 30.0–36.0)
MCV: 92.2 fL (ref 80.0–100.0)
Platelets: 107 10*3/uL — ABNORMAL LOW (ref 150–400)
RBC: 2.3 MIL/uL — ABNORMAL LOW (ref 4.22–5.81)
RDW: 15.8 % — ABNORMAL HIGH (ref 11.5–15.5)
WBC: 12.3 10*3/uL — ABNORMAL HIGH (ref 4.0–10.5)
nRBC: 0 % (ref 0.0–0.2)

## 2023-07-05 LAB — PROTIME-INR
INR: 1.2 (ref 0.8–1.2)
Prothrombin Time: 15.4 s — ABNORMAL HIGH (ref 11.4–15.2)

## 2023-07-05 LAB — MAGNESIUM: Magnesium: 2.1 mg/dL (ref 1.7–2.4)

## 2023-07-05 MED ORDER — BISACODYL 10 MG RE SUPP
10.0000 mg | Freq: Once | RECTAL | Status: AC
  Administered 2023-07-05: 10 mg via RECTAL
  Filled 2023-07-05: qty 1

## 2023-07-05 MED ORDER — METOPROLOL TARTRATE 25 MG PO TABS
25.0000 mg | ORAL_TABLET | Freq: Two times a day (BID) | ORAL | Status: DC
Start: 2023-07-05 — End: 2023-07-14
  Administered 2023-07-05 – 2023-07-14 (×19): 25 mg via ORAL
  Filled 2023-07-05 (×19): qty 1

## 2023-07-05 MED ORDER — INSULIN ASPART 100 UNIT/ML IJ SOLN
0.0000 [IU] | Freq: Three times a day (TID) | INTRAMUSCULAR | Status: DC
  Administered 2023-07-08 – 2023-07-09 (×2): 2 [IU] via SUBCUTANEOUS

## 2023-07-05 MED ORDER — ROSUVASTATIN CALCIUM 20 MG PO TABS
20.0000 mg | ORAL_TABLET | Freq: Every day | ORAL | Status: DC
  Administered 2023-07-05 – 2023-07-14 (×10): 20 mg via ORAL
  Filled 2023-07-05 (×10): qty 1

## 2023-07-05 MED ORDER — FUROSEMIDE 10 MG/ML IJ SOLN
40.0000 mg | Freq: Two times a day (BID) | INTRAMUSCULAR | Status: DC
  Administered 2023-07-05 (×2): 40 mg via INTRAVENOUS
  Filled 2023-07-05 (×3): qty 4

## 2023-07-05 MED ORDER — WARFARIN SODIUM 5 MG PO TABS
5.0000 mg | ORAL_TABLET | Freq: Once | ORAL | Status: AC
  Administered 2023-07-05: 5 mg via ORAL
  Filled 2023-07-05: qty 1

## 2023-07-05 MED ORDER — POTASSIUM CHLORIDE CRYS ER 20 MEQ PO TBCR
20.0000 meq | EXTENDED_RELEASE_TABLET | Freq: Two times a day (BID) | ORAL | Status: DC
  Administered 2023-07-05 – 2023-07-07 (×5): 20 meq via ORAL
  Filled 2023-07-05 (×5): qty 1

## 2023-07-05 MED FILL — Lidocaine HCl Local Preservative Free (PF) Inj 2%: INTRAMUSCULAR | Qty: 14 | Status: AC

## 2023-07-05 MED FILL — Potassium Chloride Inj 2 mEq/ML: INTRAVENOUS | Qty: 40 | Status: AC

## 2023-07-05 MED FILL — Heparin Sodium (Porcine) Inj 1000 Unit/ML: Qty: 1000 | Status: AC

## 2023-07-05 MED FILL — Vancomycin HCl For IV Soln 10 GM (Base Equivalent): INTRAVENOUS | Qty: 1500 | Status: AC

## 2023-07-05 MED FILL — Cefazolin Sodium-Dextrose IV Solution 2 GM/100ML-4%: INTRAVENOUS | Qty: 100 | Status: AC

## 2023-07-05 NOTE — Plan of Care (Signed)
  Problem: Activity: Goal: Risk for activity intolerance will decrease Outcome: Progressing   Problem: Nutrition: Goal: Adequate nutrition will be maintained Outcome: Progressing   Problem: Elimination: Goal: Will not experience complications related to bowel motility Outcome: Progressing   

## 2023-07-05 NOTE — Progress Notes (Signed)
NAME:  Victor [name], MRN:  [id number], DOB:  1971-01-14, LOS: 17 ADMISSION DATE:  06/17/2023 CONSULTATION DATE:  07/02/2023 REFERRING MD:  Leafy Ro - TCTS CHIEF COMPLAINT:  Postoperative vent management   History of Present Illness:  Victor [name] is seen in consultation at the request of Dr. Leafy Ro (TCTS) for management of mechanical ventilation and hemodynamics post-MVR.  52 year old man who initially presented to [facility] 11/25 for SOB, orthopnea, BLE edema and back pain x 2 weeks.  PMHx significant for HTN, HLD, nonobstructive CAD, diastolic HFpEF (Echo 05/2023 with EF 65-70%, G2DD, +SAM with subaortic membrane of LVOT, degenerative MV with calcified anterior/posterior leaflets), obesity, EtOH use, low back pain. 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 [facility] 06/2023 demonstrated nonobstructive CAD, severely elevated L heart and PA pressures, normal CO/CI; possible HOCM. cMRI 06/2023 showed small pericardial effusion, suspected LV outflow gradient, +SAM, moderate to severe MR, noncoronary LGE c/w HOCM/infiltrative disease.  Underwent MVR 12/10 with Dr. 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   PCCM consulted for post-operative ventilator management.  Pertinent Medical History:   Past Medical History:  Diagnosis Date   CAD (coronary artery disease)    Nonobstructive on [facility] 06/2023   Dizziness 10/15/2013    "SWIMMY HEAD    "   HOCM (hypertrophic obstructive cardiomyopathy) (HCC)    Hypertension    Hypertriglyceridemia    Obesity    Severe mitral regurgitation    Significant Hospital Events: Including procedures, antibiotic start and stop dates in addition to other pertinent events   12/10: MVR. CCM consulted for MV/hemodynamics. 12/11: extubated early AM POD 1. Pressors off. Tachypneic with small breaths.  +diuretics. Dc swan and a-line  Interim History / Subjective:  No events. Slept okay Still relatively low IS and high RR  Objective:  Blood pressure (!) 145/62, pulse 77, temperature 97.7 F (36.5 C), temperature source Oral, resp. rate (!) 30, height 5\' 7"  (1.702 m), weight 93.8 kg, SpO2 100%.        Intake/Output Summary (Last 24 hours) at 07/05/2023 0848 Last data filed at 07/05/2023 0800 Gross per 24 hour  Intake 645.06 ml  Output 1745 ml  Net -1099.94 ml   Filed Weights   07/03/23 0500 07/04/23 0500 07/05/23 0500  Weight: 94 kg 93.1 kg 93.8 kg   Physical Examination: No distress Sternotomy looks okay Urine looks clean Moves to command Poor air movement on R and minimal movement on inspiration, elevated diaphragm; minimal fluid  Patient Lines/Drains/Airways Status     Active Line/Drains/Airways     Name Placement date Placement time Site Days   Peripheral IV 07/02/23 16 G Right Hand 07/02/23  0657  Hand  3   Urethral Catheter Villa Herb, RN Latex;Temperature probe 16 Fr. 07/02/23  0754  Latex;Temperature probe  3   Incision (Closed) 07/02/23 Chest 07/02/23  1129  -- 3            All cell lines drifting down BMP ok Sugars okay  Resolved Hospital Problem List:  Post-operative vent management   Assessment & Plan:  Post operative vent management, resolved  Post-operative atelectasis; extubated 12/11 early AM. On 4LNC. 250 on IS. Shallow, tachypneic breaths. 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  Hopefully improves over time with mobility. R<L diaphragmatic weakness - Wean O2 for sats  90% - IS, flutter, mobilize as able - Consider BIPAP at bedtime  if willing  S/p MVR with St. Jude mechanical prosthesis (29mm) Severe MR with endocarditis, now s/p MVR Streptococcal salivarius bacteremia Acute-on-chronic HFpEF HTN HLD ?HOCM Nonobstructive CAD - GDMT and AC per TCTS/PharmD - Continue diuresis as tolerated by renal function  Esophageal candidiasis-  getting 14 days fluconazole  Constipation- strengthen bowel regimen  ABLA in the setting of cardiac surgery Thrombocytopenia Lots of oozing postop which has since resolved. Monitor, transfusion threshold per TCTS (Usually < 20 Hct)  Hyponatremia -likely hypervolemia Plan: -diuresis per TCTS  EtOH use, daily Plan: -thiamine, folic acid, mvi -ciwa  Tobacco use  Plan: -nicotine patch  Physical deconditioning- would not be surprised if needs rehab   Best Practice: (right click and "Reselect all SmartList Selections" daily)   Diet/type: heart healthy DVT prophylaxis: systemic heparin GI prophylaxis: PPI Lines: remove Foley:  remove Code Status:  full code Last date of multidisciplinary goals of care discussion [updated at time of eval]  Stable for progressive, will discuss with TCTS if should stay on [facility] or go to [facility]; if latter will sign out for pickup 07/06/23  Myrla Halsted MD PCCM

## 2023-07-05 NOTE — TOC Initial Note (Signed)
Transition of Care (TOC) - Initial/Assessment Note  Donn Pierini RN, BSN Transitions of Care Unit 4E- RN Case Manager See Treatment Team for direct phone #   Patient Details  Name: Victor [name] Age MRN: [id number] Date of Birth: [birth year]  Transition of Care [facility]) CM/SW Contact:    Darrold Span, RN Phone Number: 07/05/2023, 2:14 PM  Clinical Narrative:                 Pt transferred to 4E from 2H this am, note pt will need home IV abx on discharge.  Per handoff- [facility] following for home infusion needs.   Call made to [facility] with [facility]- confirmed that they are following for infusion needs- Santina Evans also voiced they can provide the [facility] for nursing needs and will tentatively plan for Monday 12/16 to provide bedside education.   Note per PT note pt may need HHPT pending progress- CM will follow for therapy needs as well as potential DME needs as pt progresses.   Will need final OPAT order for [facility] prior to discharge.   Expected Discharge Plan: Home w Home Health Services Barriers to Discharge: Continued Medical Work up   Patient Goals and CMS Choice Patient states their goals for this hospitalization and ongoing recovery are:: return home          Expected Discharge Plan and Services   Discharge Planning Services: CM Consult Post Acute Care Choice: Home Health, Durable Medical Equipment Living arrangements for the past 2 months: Single Family Home                           HH Arranged: RN, IV Antibiotics HH Agency: Other - See comment ([facility]) Date HH Agency Contacted: 07/05/23 Time HH Agency Contacted: 1413 Representative spoke with at [facility] Agency: Santina Evans  Prior Living Arrangements/Services Living arrangements for the past 2 months: Single Family Home Lives with:: Self Patient language and need for interpreter reviewed:: Yes        Need for Family Participation in Patient Care: Yes (Comment) Care giver support system in place?:  Yes (comment)   Criminal Activity/Legal Involvement Pertinent to Current Situation/Hospitalization: No - Comment as needed  Activities of Daily Living   ADL Screening (condition at time of admission) Independently performs ADLs?: Yes (appropriate for developmental age) Is the patient deaf or have difficulty hearing?: No Does the patient have difficulty seeing, even when wearing glasses/contacts?: No Does the patient have difficulty concentrating, remembering, or making decisions?: No  Permission Sought/Granted   Permission granted to share information with : Yes, Verbal Permission Granted              Emotional Assessment Appearance:: Appears stated age         Psych Involvement: No (comment)  Admission diagnosis:  Acute on chronic heart failure with preserved ejection fraction (HFpEF) (HCC) [I50.33] Shortness of breath [R06.02] S/P mitral valve replacement [Z95.2] Patient Active Problem List   Diagnosis Date Noted   S/P mitral valve replacement 07/02/2023   Heart failure (HCC) 07/02/2023   Benign neoplasm of colon 06/28/2023   Severe mitral regurgitation 06/27/2023   Subacute bacterial endocarditis 06/27/2023   Iron deficiency anemia 06/27/2023   Abnormal liver diagnostic imaging 06/27/2023   Elevated liver enzymes 06/27/2023   Acute heart failure with preserved ejection fraction (HCC) 06/18/2023   Shortness of breath 06/18/2023   Lumbar back pain with radiculopathy affecting lower extremity 05/07/2023   S/P hernia repair 02/03/2021  Inguinal hernia 02/03/2021   HTN (hypertension) 12/06/2014   Dyspnea 10/16/2013   Hypertriglyceridemia    Dizziness 10/15/2013   Weakness 10/15/2013   Abnormal EKG 10/15/2013   PCP:  Claiborne Rigg, NP Pharmacy:   [facility] [address], [address] [place] [address] Phone: [phone] Fax: [phone]  [id number] [place], [place] - [address]  AT [facility] [address] [place] [address] Phone: [phone] Fax: [phone]  [id number] [place], [place] - [address] AT [facility] OF [address] [place] [address] Phone: [phone] Fax: [phone]  [facility] [address] [place] [address] Phone: [phone] Fax: [phone]     Social Drivers of Health (SDOH) Social History: SDOH Screenings   Food Insecurity: Food Insecurity Present (06/18/2023)  Housing: Medium Risk (06/18/2023)  Transportation Needs: Unmet Transportation Needs (06/18/2023)  Utilities: Not At Risk (06/18/2023)  Depression (PHQ2-9): Medium Risk (05/07/2023)  Tobacco Use: High Risk (07/02/2023)   SDOH Interventions: Food Insecurity Interventions: Inpatient TOC, Other (Comment) (Pt accepted resources) Transportation Interventions: Inpatient TOC   Readmission Risk Interventions     No data to display

## 2023-07-05 NOTE — Progress Notes (Signed)
ANTICOAGULATION CONSULT NOTE  Pharmacy Consult for heparin no bolus + warfarin Indication: s/p mechanical MVR 12/10  Allergies  Allergen Reactions   Iodinated Contrast Media Itching   Gadolinium Derivatives     Patient Measurements: Height: 5\' 7"  (170.2 cm) Weight: 93.1 kg (205 lb 4 oz) IBW/kg (Calculated) : 66.1 Heparin Dosing Weight: 83 kg  Vital Signs: Temp: 98.4 F (36.9 C) (12/13 0022) Temp Source: Axillary (12/13 0022) BP: 125/61 (12/13 0215) Pulse Rate: 78 (12/13 0215)  Labs: Recent Labs    07/02/23 1244 07/02/23 1408 07/02/23 1631 07/02/23 1738 07/03/23 0412 07/03/23 0800 07/03/23 1603 07/04/23 0518 07/04/23 1529 07/04/23 1530 07/04/23 1825 07/04/23 1828 07/05/23 0209  HGB 7.7*   < >  --    < > 8.6* 9.2* 9.0* 8.0*  --   --   --   --   --   HCT 24.2*   < >  --    < > 25.2* 27.0* 27.4* 24.0*  --   --   --   --   --   PLT 143*  --  127*   < > 113*  --  124* 115*  --   --   --   --   --   APTT 36  --  28  --   --   --   --   --   --   --   --   --   --   LABPROT 17.9*  --  17.2*  --   --   --   --   --   --  15.1  --   --   --   INR 1.5*  --  1.4*  --   --   --   --   --   --  1.2  --   --   --   HEPARINUNFRC  --   --   --   --   --   --   --   --  <0.10*  --  <0.10*  --  0.18*  CREATININE  --    < >  --    < > 0.65  --  0.81 0.73  --   --   --  0.74  --    < > = values in this interval not displayed.    Estimated Creatinine Clearance: 114.8 mL/min (by C-G formula based on SCr of 0.74 mg/dL).  Medical History: Past Medical History:  Diagnosis Date   CAD (coronary artery disease)    Nonobstructive on [facility] 06/2023   Dizziness 10/15/2013    "SWIMMY HEAD    "   HOCM (hypertrophic obstructive cardiomyopathy) (HCC)    Hypertension    Hypertriglyceridemia    Obesity    Severe mitral regurgitation     Assessment: 52 yo male with strep salivarius bacteremia and native valve endocarditis with severe MR s/p mechanical MVR and septal myectomy on 12/10 and  returned to OR with bleeding immediately postop.  Not on anticoagulation prior to admission.  Pharmacy consulted for heparin and warfarin dosing POD#2.  Of note, D#7/14 of fluconazole and started on postop amio ppx today which will both reduce warfarin metabolism / enhance anticoagulant effect.  Postop anemia - Hgb 8.0, pltc 115.  Baseline INR 1.2.   12/13 AM update:  Heparin level sub-therapeutic but trending up  Goal of Therapy:  INR 2.5-3.5 Heparin level 0.3-0.7 units/ml Monitor platelets by anticoagulation protocol: Yes    Plan:  Increase heparin infusion to 1550 units/hr 8 hour heparin level  Abran Duke, PharmD, BCPS Clinical Pharmacist Phone: [phone]

## 2023-07-05 NOTE — Progress Notes (Signed)
Patient arrived from  [facility] VSS alert and oriented x 4 CCMD notifed, CHG bath completed, Bed in lowest position with call light in reach current plan of care in progress

## 2023-07-05 NOTE — Progress Notes (Signed)
Mobility Specialist Progress Note:   07/05/23 1307  Mobility  Activity Ambulated with assistance in room  Level of Assistance Minimal assist, patient does 75% or more  Assistive Device Front wheel walker  Distance Ambulated (ft) 15 ft  RUE Weight Bearing Per Provider Order NWB  LUE Weight Bearing Per Provider Order NWB  Activity Response Tolerated well  Mobility Referral Yes  Mobility visit 1 Mobility  Mobility Specialist Start Time (ACUTE ONLY) 1255  Mobility Specialist Stop Time (ACUTE ONLY) 1305  Mobility Specialist Time Calculation (min) (ACUTE ONLY) 10 min   Pt received in BR, requesting assistance back to bed. Void successful.  Assisted with pericare. Pt ambulated back to bed w/o fault. C/o chest soreness, otherwise asx throughout. Pt left in supine with call bell in reach and all needs met   Leory Plowman  Mobility Specialist Please contact via SecureChat Rehab office at [phone]

## 2023-07-05 NOTE — Progress Notes (Signed)
Physical Therapy Treatment Patient Details Name: Victor [name] MRN: [id number] DOB: 04-24-1971 Today's Date: 07/05/2023   History of Present Illness 52 years old male who was admitted on 11/26 with SOB and LE edema, pt now s/p MVR on 12/10. PMH: hyperlipidemia, obesity, hypertension, and chronic back pain    PT Comments  Focused session on transfer and gait training. Pt needed cues to gain momentum via rocking trunk and brining "nose over toes" to achieve the power and anterior weight shift he needed to transfer to stand with more ease and independence, progressing from modA at start of session to CGA at end of session. He also progressed to no UE support for a short gait bout. He ambulates slowly and cautiously, but no LOB, CGA for safety. Will continue to follow acutely.    If plan is discharge home, recommend the following: A little help with bathing/dressing/bathroom;Help with stairs or ramp for entrance;Assist for transportation;Assistance with cooking/housework   Can travel by private Scientist, research (medical) walker (2 wheels) (will cont to assess, may not need)    Recommendations for Other Services       Precautions / Restrictions Precautions Precautions: Sternal;Fall Precaution Booklet Issued: Yes (comment) Precaution Comments: reviewed sternal precautions Restrictions Weight Bearing Restrictions Per Provider Order: Yes Other Position/Activity Restrictions: sternal precautions     Mobility  Bed Mobility Overal bed mobility: Needs Assistance Bed Mobility: Sit to Sidelying         Sit to sidelying: Mod assist, HOB elevated General bed mobility comments: Pt requesting HOB be elevated some, modA needed to lift legs to bed    Transfers Overall transfer level: Needs assistance Equipment used: Rolling walker (2 wheels) Transfers: Sit to/from Stand Sit to Stand: Min assist, Mod assist, Contact guard assist           General transfer  comment: Pt needed modA to power up to stand the first rep from recliner to RW. Provided pt with demonstration on how to gain momentum via rocking trunk quickly and bringing "nose over toes" for improved ease with transfers. Success noted, as pt progressed to minA then CGA with subsequent x5 reps from recliner and x1 rep from toilet    Ambulation/Gait Ambulation/Gait assistance: Contact guard assist Gait Distance (Feet): 65 Feet (x2 bouts of ~65 ft > ~25 ft) Assistive device: Rolling walker (2 wheels), None Gait Pattern/deviations: Step-through pattern, Decreased stride length Gait velocity: slowed Gait velocity interpretation: <1.31 ft/sec, indicative of household ambulator   General Gait Details: Pt ambulates slowly with decreased bil step length. Cues provided to increase stride length. Pt used the RW the first bout but no UE support the second bout, no LOB, pt slow and cautious. CGA for safety   Stairs             Wheelchair Mobility     Tilt Bed    Modified Rankin (Stroke Patients Only)       Balance Overall balance assessment: Mild deficits observed, not formally tested                                          Cognition Arousal: Alert Behavior During Therapy: WFL for tasks assessed/performed Overall Cognitive Status: Within Functional Limits for tasks assessed  Exercises Other Exercises Other Exercises: sit <> stand x5 serial reps from recliner    General Comments General comments (skin integrity, edema, etc.): VSS on RA      Pertinent Vitals/Pain Pain Assessment Pain Assessment: Faces Faces Pain Scale: Hurts little more Pain Location: generalized with mobility at sternal incision Pain Descriptors / Indicators: Discomfort, Operative site guarding Pain Intervention(s): Limited activity within patient's tolerance, Monitored during session, Repositioned    Home Living                           Prior Function            PT Goals (current goals can now be found in the care plan section) Acute Rehab PT Goals 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, get back to work painting PT Goal Formulation: With patient Time For Goal Achievement: 07/17/23 Potential to Achieve Goals: Good Progress towards PT goals: Progressing toward goals    Frequency    Min 1X/week      PT Plan      Co-evaluation              AM-PAC PT "6 Clicks" Mobility   Outcome Measure  Help needed turning from your back to your side while in a flat bed without using bedrails?: A Little Help needed moving from lying on your back to sitting on the side of a flat bed without using bedrails?: A Little Help needed moving to and from a bed to a chair (including a wheelchair)?: A Little Help needed standing up from a chair using your arms (e.g., wheelchair or bedside chair)?: A Little Help needed to walk in hospital room?: A Little Help needed climbing 3-5 steps with a railing? : A Lot 6 Click Score: 17    End of Session Equipment Utilized During Treatment: Gait belt Activity Tolerance: Patient tolerated treatment well Patient left: with call bell/phone within reach;in bed;with bed alarm set Nurse Communication: Mobility status PT Visit Diagnosis: Unsteadiness on feet (R26.81);Difficulty in walking, not elsewhere classified (R26.2);Muscle weakness (generalized) (M62.81);Other abnormalities of gait and mobility (R26.89)     Time: 4098-1191 PT Time Calculation (min) (ACUTE ONLY): 32 min  Charges:    $Gait Training: 8-22 mins $Therapeutic Activity: 8-22 mins PT General Charges $$ ACUTE PT VISIT: 1 Visit                     Virgil Benedict, PT, DPT Acute Rehabilitation Services  Office: [phone]    Bettina Gavia 07/05/2023, 10:21 AM

## 2023-07-05 NOTE — Progress Notes (Signed)
Patient ambulated in hall with use of walker.  Gait appears stable and steady albeit slow. No SOB noted. Denies dizziness. Back to room to sit in chair.

## 2023-07-05 NOTE — Progress Notes (Signed)
ANTICOAGULATION CONSULT NOTE  Pharmacy Consult for heparin no bolus + warfarin Indication: s/p mechanical MVR 12/10  Allergies  Allergen Reactions   Iodinated Contrast Media Itching   Gadolinium Derivatives     Patient Measurements: Height: 5\' 7"  (170.2 cm) Weight: 93.8 kg (206 lb 12.7 oz) IBW/kg (Calculated) : 66.1 Heparin Dosing Weight: 83 kg  Vital Signs: Temp: 98.4 F (36.9 C) (12/13 2005) Temp Source: Oral (12/13 2005) BP: 150/68 (12/13 2005) Pulse Rate: 68 (12/13 2005)  Labs: Recent Labs    07/03/23 1603 07/04/23 0518 07/04/23 1529 07/04/23 1530 07/04/23 1825 07/04/23 1828 07/05/23 0209 07/05/23 0454 07/05/23 1304 07/05/23 1958  HGB 9.0* 8.0*  --   --   --   --   --  6.9*  --   --   HCT 27.4* 24.0*  --   --   --   --   --  21.2*  --   --   PLT 124* 115*  --   --   --   --   --  107*  --   --   LABPROT  --   --   --  15.1  --   --   --  15.4*  --   --   INR  --   --   --  1.2  --   --   --  1.2  --   --   HEPARINUNFRC  --   --    < >  --    < >  --    < > 0.17* 0.22* 0.29*  CREATININE 0.81 0.73  --   --   --  0.74  --  0.62  --   --    < > = values in this interval not displayed.    Estimated Creatinine Clearance: 115.3 mL/min (by C-G formula based on SCr of 0.62 mg/dL).  Medical History: Past Medical History:  Diagnosis Date   CAD (coronary artery disease)    Nonobstructive on [facility] 06/2023   Dizziness 10/15/2013    "SWIMMY HEAD    "   HOCM (hypertrophic obstructive cardiomyopathy) (HCC)    Hypertension    Hypertriglyceridemia    Obesity    Severe mitral regurgitation     Assessment: 52 yo male with strep salivarius bacteremia and native valve endocarditis with severe MR s/p mechanical MVR and septal myectomy on 12/10 and returned to OR with bleeding immediately postop and received multiple blood products (6u PRBC, 2 FFP, 2 platelets, 2 cryo).  Not on anticoagulation prior to admission.  Pharmacy consulted for heparin and warfarin dosing starting  POD#2 (12/12).  Of note, D#8/14 of fluconazole for esophageal candidiasis and started on postop amio ppx 12/12 which will both reduce warfarin metabolism / enhance anticoagulant effect.  Heparin level remains just slightly subtherapeutic (0.29) on infusion at 1700 units/hr. No issues with line or bleeding reported per RN.  Goal of Therapy:  INR 2.5-3.5 Heparin level 0.3-0.7 units/ml Monitor platelets by anticoagulation protocol: Yes   Plan:  Increase heparin infusion to 1800 units/hr Will f/u 6 hr heparin level  Christoper Fabian, PharmD, BCPS Please see amion for complete clinical pharmacist phone list 07/05/2023 8:39 PM

## 2023-07-05 NOTE — Progress Notes (Incomplete)
ANTICOAGULATION CONSULT NOTE  Pharmacy Consult for heparin no bolus + warfarin Indication: s/p mechanical MVR 12/10  Allergies  Allergen Reactions   Iodinated Contrast Media Itching   Gadolinium Derivatives     Patient Measurements: Height: 5\' 7"  (170.2 cm) Weight: 93.8 kg (206 lb 12.7 oz) IBW/kg (Calculated) : 66.1 Heparin Dosing Weight: 83 kg  Vital Signs: Temp: 98.4 F (36.9 C) (12/13 0022) Temp Source: Axillary (12/13 0022) BP: 145/62 (12/13 0700) Pulse Rate: 77 (12/13 0700)  Labs: Recent Labs    07/02/23 1244 07/02/23 1408 07/02/23 1631 07/02/23 1738 07/03/23 1603 07/04/23 0518 07/04/23 1529 07/04/23 1530 07/04/23 1825 07/04/23 1828 07/05/23 0209 07/05/23 0454  HGB 7.7*   < >  --    < > 9.0* 8.0*  --   --   --   --   --  6.9*  HCT 24.2*   < >  --    < > 27.4* 24.0*  --   --   --   --   --  21.2*  PLT 143*  --  127*   < > 124* 115*  --   --   --   --   --  107*  APTT 36  --  28  --   --   --   --   --   --   --   --   --   LABPROT 17.9*  --  17.2*  --   --   --   --  15.1  --   --   --  15.4*  INR 1.5*  --  1.4*  --   --   --   --  1.2  --   --   --  1.2  HEPARINUNFRC  --   --   --   --   --   --    < >  --  <0.10*  --  0.18* 0.17*  CREATININE  --    < >  --    < > 0.81 0.73  --   --   --  0.74  --  0.62   < > = values in this interval not displayed.    Estimated Creatinine Clearance: 115.3 mL/min (by C-G formula based on SCr of 0.62 mg/dL).  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  Not on anticoagulation prior to admission.  Pharmacy consulted for heparin and warfarin dosing starting POD#2 (12/12).  Of note, D#8/14 of fluconazole for esophageal candidiasis and started on postop amio ppx 12/12 which will both reduce warfarin metabolism / enhance anticoagulant effect.  Postop anemia - Hgb down to 6.9, pltc 107.  INR 1.2 today, subtherapeutic as expected.  Goal of Therapy:  INR 2.5-3.5 Heparin level 0.3-0.7 units/ml Monitor platelets by anticoagulation protocol: Yes   Plan:  Increase heparin infusion to 1400 units/hr*** 6 hour heparin level  Warfarin 5 mg PO  x 1  Daily CBC, heparin level Monitor for s/sx of bleeding  Trixie Rude, PharmD Clinical Pharmacist 07/05/2023  9:03 AM

## 2023-07-05 NOTE — Progress Notes (Signed)
[address]       [place] [address]             [phone]      3 Days Post-Op  Procedure(s) (LRB): EXPLORATION POST OPERATIVE OPEN HEART (N/A)   Total Length of Stay:  LOS: 17 days    SUBJECTIVE: Feels well Ambulated without issues  Vitals:   07/05/23 0600 07/05/23 0700  BP: (!) 118/59 (!) 145/62  Pulse: 74 77  Resp: 17 (!) 30  Temp:    SpO2: 100% 100%    Intake/Output      12/12 0701 12/13 0700 12/13 0701 12/14 0700   P.O. 480    I.V. (mL/kg) 8.7 (0.1)    IV Piggyback 263.3    Total Intake(mL/kg) 752 (8)    Urine (mL/kg/hr) 1545 (0.7)    Chest Tube 70    Total Output 1615    Net -863             epinephrine Stopped (07/03/23 0714)   heparin 1,550 Units/hr (07/05/23 0334)   norepinephrine (LEVOPHED) Adult infusion Stopped (07/03/23 0543)   penicillin G potassium 12 Million Units in dextrose 5 % 500 mL CONTINUOUS infusion 12 Million Units (07/05/23 0011)    CBC    Component Value Date/Time   WBC 12.3 (H) 07/05/2023 0454   RBC 2.30 (L) 07/05/2023 0454   HGB 6.9 (LL) 07/05/2023 0454   HGB 11.6 (L) 07/27/2020 1417   HCT 21.2 (L) 07/05/2023 0454   HCT 35.8 (L) 07/27/2020 1417   PLT 107 (L) 07/05/2023 0454   PLT 177 07/27/2020 1417   MCV 92.2 07/05/2023 0454   MCV 88 07/27/2020 1417   MCH 30.0 07/05/2023 0454   MCHC 32.5 07/05/2023 0454   RDW 15.8 (H) 07/05/2023 0454   RDW 15.2 07/27/2020 1417   LYMPHSABS 1.9 07/02/2023 0227   MONOABS 0.6 07/02/2023 0227   EOSABS 0.2 07/02/2023 0227   BASOSABS 0.0 07/02/2023 0227   CMP     Component Value Date/Time   NA 128 (L) 07/05/2023 0454   NA 140 07/27/2020 1417   K 4.4 07/05/2023 0454   CL 95 (L) 07/05/2023 0454   CO2 23 07/05/2023 0454   GLUCOSE 140 (H) 07/05/2023 0454   BUN 12 07/05/2023 0454   BUN 11 07/27/2020 1417   CREATININE 0.62 07/05/2023 0454   CREATININE 0.95 07/30/2016 0837   CALCIUM 8.5 (L) 07/05/2023 0454   PROT 6.0 (L) 07/05/2023 0454   PROT 7.8  07/27/2020 1417   ALBUMIN 2.5 (L) 07/05/2023 0454   ALBUMIN 4.3 07/27/2020 1417   AST 29 07/05/2023 0454   ALT 17 07/05/2023 0454   ALKPHOS 47 07/05/2023 0454   BILITOT 1.0 07/05/2023 0454   BILITOT 0.3 07/27/2020 1417   GFRNONAA >60 07/05/2023 0454   GFRNONAA >89 12/27/2014 0909   GFRAA 108 07/27/2020 1417   GFRAA >89 12/27/2014 0909   ABG    Component Value Date/Time   PHART 7.401 07/03/2023 0800   PCO2ART 32.6 07/03/2023 0800   PO2ART 118 (H) 07/03/2023 0800   HCO3 20.1 07/03/2023 0800   TCO2 21 (L) 07/03/2023 0800   ACIDBASEDEF 4.0 (H) 07/03/2023 0800   O2SAT 99 07/03/2023 0800   CBG (last 3)  Recent Labs    07/04/23 2002 07/04/23 2342 07/05/23 0340  GLUCAP 167* 112* 129*  EXAM Lungs: decreased at bases Card:RR Ext: warm Neuro: intact   ASSESSMENT: POD #3 Sp MV replacement (mechanical) with septal  myectomy Hemodynamics ok. Will increase HTN management Pulmonary: has effusion vs elevated R hemidiaphragm. Will U/S to determine if needs thorocentesis On heparin until INR theraputic Diurese today Transfer to floor   Eugenio Hoes, MD 07/05/2023

## 2023-07-05 NOTE — Progress Notes (Signed)
Mobility Specialist Progress Note:   07/05/23 1305  Mobility  Activity Ambulated with assistance to bathroom  Level of Assistance Contact guard assist, steadying assist  Assistive Device Front wheel walker  Distance Ambulated (ft) 15 ft  RUE Weight Bearing Per Provider Order NWB  LUE Weight Bearing Per Provider Order NWB  Activity Response Tolerated well  Mobility Referral Yes  Mobility visit 1 Mobility  Mobility Specialist Start Time (ACUTE ONLY) 1230  Mobility Specialist Stop Time (ACUTE ONLY) 1240  Mobility Specialist Time Calculation (min) (ACUTE ONLY) 10 min   Pt received in bed, requesting assistance to BR. CG to stand and ambulate within precautions. Pt ambulated to BR with RW without fault. Asx throughout. Pt left in BR and reminded to use call bell when ready.    Leory Plowman  Mobility Specialist Please contact via [employer] office at [phone]

## 2023-07-05 NOTE — Progress Notes (Signed)
ANTICOAGULATION CONSULT NOTE  Pharmacy Consult for heparin no bolus + warfarin Indication: s/p mechanical MVR 12/10  Allergies  Allergen Reactions   Iodinated Contrast Media Itching   Gadolinium Derivatives     Patient Measurements: Height: 5\' 7"  (170.2 cm) Weight: 93.8 kg (206 lb 12.7 oz) IBW/kg (Calculated) : 66.1 Heparin Dosing Weight: 83 kg  Vital Signs: Temp: 98.9 F (37.2 C) (12/13 1204) Temp Source: Oral (12/13 1204) BP: 140/67 (12/13 1204) Pulse Rate: 61 (12/13 1204)  Labs: Recent Labs    07/02/23 1631 07/02/23 1738 07/03/23 1603 07/04/23 0518 07/04/23 1529 07/04/23 1530 07/04/23 1825 07/04/23 1828 07/05/23 0209 07/05/23 0454 07/05/23 1304  HGB  --    < > 9.0* 8.0*  --   --   --   --   --  6.9*  --   HCT  --    < > 27.4* 24.0*  --   --   --   --   --  21.2*  --   PLT 127*   < > 124* 115*  --   --   --   --   --  107*  --   APTT 28  --   --   --   --   --   --   --   --   --   --   LABPROT 17.2*  --   --   --   --  15.1  --   --   --  15.4*  --   INR 1.4*  --   --   --   --  1.2  --   --   --  1.2  --   HEPARINUNFRC  --   --   --   --    < >  --    < >  --  0.18* 0.17* 0.22*  CREATININE  --    < > 0.81 0.73  --   --   --  0.74  --  0.62  --    < > = values in this interval not displayed.    Estimated Creatinine Clearance: 115.3 mL/min (by C-G formula based on SCr of 0.62 mg/dL).  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  Not on anticoagulation prior to admission.  Pharmacy consulted for heparin and warfarin dosing starting POD#2 (12/12).  Of note, D#8/14 of fluconazole for esophageal candidiasis and started on postop amio ppx 12/12 which will both reduce warfarin metabolism / enhance anticoagulant effect.  Postop anemia - Hgb down to 6.9, pltc 107.  INR 1.2 today, subtherapeutic as expected. Heparin level subtherapeutic at 0.22.  Goal of Therapy:  INR 2.5-3.5 Heparin level 0.3-0.7 units/ml Monitor platelets by anticoagulation protocol: Yes   Plan:  Increase heparin infusion to 1700 units/hr 6 hour heparin level  Warfarin 5 mg PO x 1  Daily CBC, heparin level Monitor for s/sx of  bleeding  Cedric Fishman, PharmD, BCPS, BCCCP Clinical Pharmacist

## 2023-07-06 ENCOUNTER — Other Ambulatory Visit: Payer: Self-pay

## 2023-07-06 ENCOUNTER — Inpatient Hospital Stay (HOSPITAL_COMMUNITY): Payer: Medicaid Other

## 2023-07-06 DIAGNOSIS — E785 Hyperlipidemia, unspecified: Secondary | ICD-10-CM

## 2023-07-06 DIAGNOSIS — I330 Acute and subacute infective endocarditis: Secondary | ICD-10-CM | POA: Diagnosis not present

## 2023-07-06 DIAGNOSIS — B3781 Candidal esophagitis: Secondary | ICD-10-CM

## 2023-07-06 DIAGNOSIS — I5033 Acute on chronic diastolic (congestive) heart failure: Secondary | ICD-10-CM | POA: Diagnosis not present

## 2023-07-06 DIAGNOSIS — E871 Hypo-osmolality and hyponatremia: Secondary | ICD-10-CM | POA: Diagnosis not present

## 2023-07-06 DIAGNOSIS — E1169 Type 2 diabetes mellitus with other specified complication: Secondary | ICD-10-CM

## 2023-07-06 DIAGNOSIS — D509 Iron deficiency anemia, unspecified: Secondary | ICD-10-CM

## 2023-07-06 DIAGNOSIS — Z471 Aftercare following joint replacement surgery: Secondary | ICD-10-CM | POA: Diagnosis not present

## 2023-07-06 DIAGNOSIS — J984 Other disorders of lung: Secondary | ICD-10-CM | POA: Diagnosis not present

## 2023-07-06 DIAGNOSIS — J90 Pleural effusion, not elsewhere classified: Secondary | ICD-10-CM | POA: Diagnosis not present

## 2023-07-06 DIAGNOSIS — I10 Essential (primary) hypertension: Secondary | ICD-10-CM | POA: Diagnosis not present

## 2023-07-06 DIAGNOSIS — I517 Cardiomegaly: Secondary | ICD-10-CM | POA: Diagnosis not present

## 2023-07-06 DIAGNOSIS — I5031 Acute diastolic (congestive) heart failure: Secondary | ICD-10-CM | POA: Diagnosis not present

## 2023-07-06 LAB — PROTIME-INR
INR: 1.3 — ABNORMAL HIGH (ref 0.8–1.2)
Prothrombin Time: 16 s — ABNORMAL HIGH (ref 11.4–15.2)

## 2023-07-06 LAB — CBC
HCT: 23.7 % — ABNORMAL LOW (ref 39.0–52.0)
Hemoglobin: 7.6 g/dL — ABNORMAL LOW (ref 13.0–17.0)
MCH: 29.5 pg (ref 26.0–34.0)
MCHC: 32.1 g/dL (ref 30.0–36.0)
MCV: 91.9 fL (ref 80.0–100.0)
Platelets: 133 10*3/uL — ABNORMAL LOW (ref 150–400)
RBC: 2.58 MIL/uL — ABNORMAL LOW (ref 4.22–5.81)
RDW: 15.6 % — ABNORMAL HIGH (ref 11.5–15.5)
WBC: 11.9 10*3/uL — ABNORMAL HIGH (ref 4.0–10.5)
nRBC: 0 % (ref 0.0–0.2)

## 2023-07-06 LAB — GLUCOSE, CAPILLARY
Glucose-Capillary: 111 mg/dL — ABNORMAL HIGH (ref 70–99)
Glucose-Capillary: 110 mg/dL — ABNORMAL HIGH (ref 70–99)
Glucose-Capillary: 114 mg/dL — ABNORMAL HIGH (ref 70–99)
Glucose-Capillary: 129 mg/dL — ABNORMAL HIGH (ref 70–99)

## 2023-07-06 LAB — BASIC METABOLIC PANEL
Anion gap: 13 (ref 5–15)
BUN: 17 mg/dL (ref 6–20)
CO2: 21 mmol/L — ABNORMAL LOW (ref 22–32)
Calcium: 8.6 mg/dL — ABNORMAL LOW (ref 8.9–10.3)
Chloride: 93 mmol/L — ABNORMAL LOW (ref 98–111)
Creatinine, Ser: 0.7 mg/dL (ref 0.61–1.24)
GFR, Estimated: >60 mL/min (ref >60)
Glucose, Bld: 125 mg/dL — ABNORMAL HIGH (ref 70–99)
Potassium: 4.6 mmol/L (ref 3.5–5.1)
Sodium: 127 mmol/L — ABNORMAL LOW (ref 135–145)

## 2023-07-06 LAB — HEPARIN LEVEL (UNFRACTIONATED)
Heparin Unfractionated: 0.37 [IU]/mL (ref 0.30–0.70)
Heparin Unfractionated: 0.42 [IU]/mL (ref 0.30–0.70)

## 2023-07-06 MED ORDER — TORSEMIDE 20 MG PO TABS
40.0000 mg | ORAL_TABLET | Freq: Every day | ORAL | Status: DC
  Administered 2023-07-06 – 2023-07-08 (×3): 40 mg via ORAL
  Filled 2023-07-06 (×3): qty 2

## 2023-07-06 MED ORDER — FERROUS GLUCONATE 324 (38 FE) MG PO TABS
324.0000 mg | ORAL_TABLET | Freq: Every day | ORAL | Status: DC
  Administered 2023-07-07 – 2023-07-14 (×8): 324 mg via ORAL
  Filled 2023-07-06 (×8): qty 1

## 2023-07-06 MED ORDER — WARFARIN SODIUM 5 MG PO TABS
5.0000 mg | ORAL_TABLET | Freq: Once | ORAL | Status: AC
  Administered 2023-07-06: 5 mg via ORAL
  Filled 2023-07-06: qty 1

## 2023-07-06 NOTE — Progress Notes (Signed)
CARDIAC REHAB PHASE I   PRE:  Rate/Rhythm: 69 SR  BP:  Sitting: 133/65  MODE:  Ambulation: 100 ft   POST:  Rate/Rhythm: 67 SR  BP:  Sitting: 107/85   SaO2: 100 RA  Pt seen with EP Kaylee. Pt in bed, agreeable to amb. Pt to standing with min assist and cues for hand placement/rocking forward, following sternal precautions well. Pt amb in hallway with contact guard and RW, tolerated well with no s/sx and short standing break at halfway point. Pt returned to recliner, c/o SOB towards end of walk. SpO2 100%, resolved with rest. Pt left in recliner with call bell in reach. [phone]   Jonna Coup, MS, ACSM-CEP 07/06/2023 9:09 AM

## 2023-07-06 NOTE — Progress Notes (Signed)
ANTICOAGULATION CONSULT NOTE  Pharmacy Consult for heparin no bolus + warfarin Indication: s/p mechanical MVR 12/10  Allergies  Allergen Reactions   Iodinated Contrast Media Itching   Gadolinium Derivatives     Patient Measurements: Height: 5\' 7"  (170.2 cm) Weight: 93.8 kg (206 lb 12.7 oz) IBW/kg (Calculated) : 66.1 Heparin Dosing Weight: 83 kg  Vital Signs: Temp: 98.6 F (37 C) (12/13 2340) Temp Source: Oral (12/13 2340) BP: 101/65 (12/13 2340) Pulse Rate: 60 (12/13 2340)  Labs: Recent Labs    07/04/23 0518 07/04/23 1529 07/04/23 1530 07/04/23 1825 07/04/23 1828 07/05/23 0209 07/05/23 0454 07/05/23 1304 07/05/23 1958 07/06/23 0244 07/06/23 0245  HGB 8.0*  --   --   --   --   --  6.9*  --   --   --  7.6*  HCT 24.0*  --   --   --   --   --  21.2*  --   --   --  23.7*  PLT 115*  --   --   --   --   --  107*  --   --   --  133*  LABPROT  --   --  15.1  --   --   --  15.4*  --   --   --  16.0*  INR  --   --  1.2  --   --   --  1.2  --   --   --  1.3*  HEPARINUNFRC  --    < >  --    < >  --    < > 0.17* 0.22* 0.29* 0.37  --   CREATININE 0.73  --   --   --  0.74  --  0.62  --   --   --  0.70   < > = values in this interval not displayed.    Estimated Creatinine Clearance: 115.3 mL/min (by C-G formula based on SCr of 0.7 mg/dL).  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  Not on anticoagulation prior to admission.  Pharmacy consulted for heparin and warfarin dosing starting POD#2 (12/12).  Of note, D#8/14 of fluconazole for esophageal candidiasis and started on postop amio ppx 12/12 which will both reduce warfarin metabolism / enhance anticoagulant effect.  12/14 AM: Heparin level remains therapeutic 0.37 on infusion at 1800 units/hr. No  issues with line or bleeding reported per RN. Hgb improved from 6.7 > 7.6, plts 106 > 133  Goal of Therapy:  INR 2.5-3.5 Heparin level 0.3-0.7 units/ml Monitor platelets by anticoagulation protocol: Yes   Plan:  Continue heparin infusion at 1800 units/hr Will f/u 6 hr confirmatory heparin level  Arabella Merles, PharmD. Clinical Pharmacist 07/06/2023 3:52 AM

## 2023-07-06 NOTE — Assessment & Plan Note (Addendum)
Continue blood pressure monitoring.  Continue with metoprolol, dc amlodipine and start patient on losartan for RAS inhibition.

## 2023-07-06 NOTE — Assessment & Plan Note (Signed)
Continue with fluconazole, plan for 14 days of therapy.

## 2023-07-06 NOTE — Progress Notes (Addendum)
At bedside to place [facility] per order.  Pt states he "just got up in the chair" and does not want to get back in bed now.  Pt did sign consent for PICC placement, just requests for tomorrow or Monday to be done.  Dr notified via securechat.  PIV x2 WNL.

## 2023-07-06 NOTE — Progress Notes (Addendum)
[address]       [name] [address]             [phone]      4 Days Post-Op  Mitral valve replacement and resection of subaortic membrane and septal myectomy with subsequent take back to OR for exploration for bleeding.  Subjective: Transferred from ICU yesterday.  He is pleased with his progress. Walked in the hall yesterday, appetite improving. Having some shortness of breath with activity but currently on RA.  BM yesterday.  Heparin infusing.  Objective: Vital signs in last 24 hours: Temp:  [98.1 F (36.7 C)-98.9 F (37.2 C)] 98.1 F (36.7 C) (12/14 0740) Pulse Rate:  [60-88] 60 (12/14 0418) Cardiac Rhythm: Normal sinus rhythm;Heart block (12/13 2005) Resp:  [16-26] 20 (12/14 0740) BP: (101-150)/(52-120) 133/65 (12/14 0740) SpO2:  [96 %-100 %] 100 % (12/14 0418) Weight:  [96.3 kg] 96.3 kg (12/14 0546)  Hemodynamic parameters for last 24 hours:    Intake/Output from previous day: 12/13 0701 - 12/14 0700 In: 2550.2 [P.O.:360; I.V.:564.5; IV Piggyback:1625.6] Out: 1745 [Urine:1745] Intake/Output this shift: Total I/O In: -  Out: 500 [Urine:500]  General appearance: alert, cooperative, and no distress Neurologic: intact Heart: RRR, no significant arrhythmias Lungs: breaths sounds clear on the left, diminished on the right.  Abdomen: soft, no tenderness Extremities: 1+ LE edema, has 2 peripheral IV's, no PICC yet.   Lab Results: Recent Labs    07/05/23 0454 07/06/23 0245  WBC 12.3* 11.9*  HGB 6.9* 7.6*  HCT 21.2* 23.7*  PLT 107* 133*   BMET:  Recent Labs    07/05/23 0454 07/06/23 0245  NA 128* 127*  K 4.4 4.6  CL 95* 93*  CO2 23 21*  GLUCOSE 140* 125*  BUN 12 17  CREATININE 0.62 0.70  CALCIUM 8.5* 8.6*    PT/INR:  Recent Labs    07/06/23 0245  LABPROT 16.0*  INR 1.3*   ABG    Component Value Date/Time   PHART 7.401 07/03/2023 0800   HCO3 20.1 07/03/2023 0800   TCO2 21 (L) 07/03/2023 0800   ACIDBASEDEF 4.0  (H) 07/03/2023 0800   O2SAT 99 07/03/2023 0800   CBG (last 3)  Recent Labs    07/05/23 1628 07/05/23 2048 07/06/23 0539  GLUCAP 111* 129* 111*    Assessment/Plan: S/P Procedure(s) (LRB): EXPLORATION POST OPERATIVE OPEN HEART (N/A)  -POD4 MV replacement with a 29mm St. Jude mechanical valve with resection of subaortic membrane and septal myectomy. VS and cardiac rhythm stable. On metoprolol, statin, ASA, and prophylactic amiodarone.  Coumadin dosed at 5mg  (per pharmacy) x 2, INR 1.3. Progressing with mobility.  -ID: MV endocarditis- Cultured strep salivarius sensitive to PCN. To continue IV PCN-G x 4 weeks post-op per ID recommendations.  Will request PICC. OPAT orders in place.   -Right pleural effusion vs elevated right hemidiaphragm- will check PA / Lat CXR today.  If still unclear will check thoracic ultrasound.   -GI- esophageal candidiasis diagnosed in ICU. On Diflucan 14 day course. Tolerating PO, having bowel movements.  -Expected acute blood loss anemia- Hct trending up. Tolerating OK. Monitoring.   -Hyponatremia-Will hold off on IV Lasix today and resume PO tomorrow. Monitor.  -Disposition- Progressing with mobility. Anticipate discharge to home with his son early next week.  TOC team working on [facility]  and services for home infusion.     LOS: 18 days    Leary Roca, [place] [phone] 07/06/2023  Chart reviewed, patient examined, agree with above.  Continuing Coumadin per pharmacy for mechanical MVR. Have to be careful with dosing with amiodarone and diflucan. Still has some SOB. CXR today shows persistent but improved RLL atelectasis/pleural effusion. The LLL looks much better. Continue IS. His weight is still 20 lbs over preop if accurate and still has some edema. Will start on daily Demedex.

## 2023-07-06 NOTE — Progress Notes (Signed)
Progress Note   Patient: Victor [name] ZOX:[id number] DOB: [birth year] DOA: 06/17/2023     18 DOS: the patient was seen and examined on 07/06/2023   Brief hospital course: Mr. Derricks was admitted to the hospital with the working diagnosis of heart failure exacerbation.   52 years old male with past medical history of hyperlipidemia, obesity, hypertension presented to hospital with exertional shortness of breath, bilateral lower extremity edema and pain for the last 2 weeks.  Patient also complains of shortness of breath at rest and has been having difficulty lying down in bed. 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  Cardiothoracic surgery was consulted and they feel that his valve will need to be replaced but want him to go under further testing for likely HOCM.  Cardiac catheterization done and he had minimal nonobstructive CAD with elevated V waves and filling pressures as well as a subaortic gradient with increased PVCs suggesting potential hypertrophic cardiomyopathy.   Cardiac MRI moderate asymmetric basal to mid septal hypertrophy. Suspect LV outflow gradient given narrowed LVOT and turbulence in LVOT.  Positive mitral valve cordal SAM or MV SAM Moderate to severe mitral regurgitation.  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   Underwent MVR 12/10 with Dr. 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   Post operative  admitted to the ICU, on mechanical ventilatory support.   12/11: extubated early AM POD 1. Pressors off. Tachypneic with small breaths. +diuretics. Dc swan and a-line   12/14 transferred back to [facility].   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  Sp mitral valve replacement, mechanical prosthesis St Jude.   Plan to continue antibiotics with Penicillin to complete 4 weeks post operative.  Patient will need PICC line for outpatient antibiotic therapy.   Post operative right pleural effusion/ atelectasis.  Continue diuresis and incentive spirometer.   Anticoagulation with warfarin and bridge with IV heparin  Post operative amiodarone to prevent arrhythmias.   Acute heart failure with preserved ejection fraction (HCC) Acute on chronic diastolic heart failure.  Echocardiogram with preserved LV systolic function with EF 70 to 75%, narrow LVOT with mid cavity gradient peak 48 mmHg. Positive mild systolic anterior motion of the mitral valve, mild LVH, RV systolic function preserved, LA with moderate dilatation, moderate mitral valve stenosis, moderate aortic valve stenosis,   TEE with no aortic stenosis, positive SAM of the chordal and subaortic membrane. Severe mitral valve regurgitation with positive vegetation, mild MV stenosis,.   Continue metoprolol for B blocker Diuresis with torsemide 40 mg po daily.  Possible addition of RAS inhibition   HTN (hypertension) Continue blood pressure monitoring.  Continue with metoprolol and amlodipine.   Iron deficiency anemia Post operative anemia, acute blood loss.  Hgb has been stable with no current indication for PRBC transfusion.  Continue with oral iron supplementation.   Esophageal candidiasis (HCC) Continue with fluconazole, plan for 14 days of therapy.   Hyponatremia Renal function with serum cr at 0,70 with K at 4,6 and serum  bicarbonate at 21.  Na 127  Plan to  continue diuresis with torsemide.  Follow up renal function and electrolytes in am.   Type 2 diabetes mellitus with hyperlipidemia (HCC) Continue glucose cover and monitoring with insulin sliding scale Glucose has been stable, with fasting glucose today 125 mg/dl.         Subjective: Patient with no chest pain, dyspnea has been improving, today with lower extremity edema   Physical Exam: Vitals:   07/06/23 0418 07/06/23 0546 07/06/23 0740 07/06/23 1143  BP: (!) 118/52  133/65 (!) 113/58  Pulse: 60     Resp: 18  20 18   Temp: 98.6 F (37 C)  98.1 F (36.7 C) 98.6 F (37 C)  TempSrc: Oral  Oral Oral  SpO2: 100%  99%   Weight:  96.3 kg    Height:       Neurology awake and alert ENT with mild pallor Cardiovascular with S1 and S2 present and regular with mild systolic murmur at the apex No JVD Positive lower extremity edema +  Respiratory with decreased breath sounds at bases with no wheezing or rhonchi Abdomen with no distention  Data Reviewed:    Family Communication: no family at the bedside   Disposition: Status is: Inpatient Remains inpatient appropriate because: post operative recovery   Planned Discharge Destination: Skilled nursing facility     Author: Coralie Keens, MD 07/06/2023 2:28 PM  For on call review [email or web address].

## 2023-07-06 NOTE — Assessment & Plan Note (Signed)
Renal function with serum cr at 0,70 with K at 4,6 and serum bicarbonate at 21.  Na 127  Plan to continue diuresis with torsemide.  Follow up renal function and electrolytes in am.

## 2023-07-06 NOTE — Progress Notes (Signed)
Mobility Specialist Progress Note    07/06/23 1543  Mobility  Activity Ambulated with assistance to bathroom  Level of Assistance Minimal assist, patient does 75% or more  Assistive Device Front wheel walker  Distance Ambulated (ft) 40 ft (20+20)  Activity Response Tolerated well  Mobility Referral Yes  Mobility visit 1 Mobility  Mobility Specialist Start Time (ACUTE ONLY) 1522  Mobility Specialist Stop Time (ACUTE ONLY) 1540  Mobility Specialist Time Calculation (min) (ACUTE ONLY) 18 min   Pt received and agreeable. Had BM. C/o pain and dizziness. Returned to sitting EOB. Requested pain meds, RN notified.   [place] Mobility Specialist  Please Neurosurgeon or Rehab Office at [phone]

## 2023-07-06 NOTE — Progress Notes (Signed)
ANTICOAGULATION CONSULT NOTE  Pharmacy Consult for heparin no bolus + warfarin Indication: s/p mechanical MVR 12/10  Allergies  Allergen Reactions   Iodinated Contrast Media Itching   Gadolinium Derivatives     Patient Measurements: Height: 5\' 7"  (170.2 cm) Weight: 96.3 kg (212 lb 4.9 oz) IBW/kg (Calculated) : 66.1 Heparin Dosing Weight: 83 kg  Vital Signs: Temp: 98.1 F (36.7 C) (12/14 0740) Temp Source: Oral (12/14 0740) BP: 133/65 (12/14 0740) Pulse Rate: 60 (12/14 0418)  Labs: Recent Labs    07/04/23 0518 07/04/23 1529 07/04/23 1530 07/04/23 1825 07/04/23 1828 07/05/23 0209 07/05/23 0454 07/05/23 1304 07/05/23 1958 07/06/23 0244 07/06/23 0245 07/06/23 0951  HGB 8.0*  --   --   --   --   --  6.9*  --   --   --  7.6*  --   HCT 24.0*  --   --   --   --   --  21.2*  --   --   --  23.7*  --   PLT 115*  --   --   --   --   --  107*  --   --   --  133*  --   LABPROT  --   --  15.1  --   --   --  15.4*  --   --   --  16.0*  --   INR  --   --  1.2  --   --   --  1.2  --   --   --  1.3*  --   HEPARINUNFRC  --    < >  --    < >  --    < > 0.17*   < > 0.29* 0.37  --  0.42  CREATININE 0.73  --   --   --  0.74  --  0.62  --   --   --  0.70  --    < > = values in this interval not displayed.    Estimated Creatinine Clearance: 116.8 mL/min (by C-G formula based on SCr of 0.7 mg/dL).  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  Not on anticoagulation prior to admission.  Pharmacy consulted for heparin and warfarin dosing starting POD#2 (12/12).  Of note, D#8/14 of fluconazole for esophageal candidiasis and started on postop amio ppx 12/12 which will both reduce warfarin metabolism / enhance anticoagulant effect.  12/14 AM: Heparin level remains  therapeutic 0.37 on infusion at 1800 units/hr. No issues with line or bleeding reported per RN. Hgb improved from 6.7 > 7.6, plts 106 > 133  **12/14 11AM: Confirmatory heparin level 0.42, therapeutic with heparin infusion at 1800 units/h. INR 1.3 today, increased from 1.2 but still subtherapeutic. CBC as above. Still on amio and fluconazole. Patient eating 50% of meals and no signs of bleeding noted.  Goal of Therapy:  INR 2.5-3.5 Heparin level 0.3-0.7 units/ml Monitor platelets by anticoagulation protocol: Yes   Plan:  Continue heparin infusion at 1800 units/hr Warfarin 5mg  PO tonight Monitor daily heparin level, INR and CBC Monitor appetite and drug interactions  Stephenie Acres, PharmD PGY1 Pharmacy Resident 07/06/2023 11:02 AM

## 2023-07-06 NOTE — Assessment & Plan Note (Signed)
Glucose has been stable, will discontinue insulin therapy and check CBG as needed.   Fasting glucose today is 120 mg/dl   Continue with rosuvastatin.

## 2023-07-06 NOTE — Assessment & Plan Note (Addendum)
Echocardiogram with preserved LV systolic function with EF 70 to 75%, narrow LVOT with mid cavity gradient peak 48 mmHg. Positive mild systolic anterior motion of the mitral valve, mild LVH, RV systolic function preserved, LA with moderate dilatation, moderate mitral valve stenosis, moderate aortic valve stenosis,   TEE with no aortic stenosis, positive SAM of the chordal and subaortic membrane. Severe mitral valve regurgitation with positive vegetation, mild MV stenosis,.   Continue metoprolol for B blocker Diuresis with torsemide 40 mg po daily.  Will start patient on low dose spironolactone, exchange amlodipine for losartan.

## 2023-07-06 NOTE — Assessment & Plan Note (Addendum)
Severe mitral valve endocarditis Streptococcal bacteremia.  Sp mitral valve replacement, mechanical prosthesis St Jude.   Plan to continue antibiotics with Penicillin to complete 4 weeks post operative.  Patient will need PICC line for outpatient antibiotic therapy.   Post operative right pleural effusion/ atelectasis.  Continue diuresis and incentive spirometer.   Anticoagulation with warfarin and bridge with IV heparin  Today INR is 1.9  Post operative amiodarone to prevent arrhythmias.

## 2023-07-06 NOTE — Assessment & Plan Note (Addendum)
Post operative anemia, acute blood loss.  Hgb has been stable with no current indication for PRBC transfusion.  Continue with oral iron supplementation.

## 2023-07-07 DIAGNOSIS — E871 Hypo-osmolality and hyponatremia: Secondary | ICD-10-CM | POA: Diagnosis not present

## 2023-07-07 DIAGNOSIS — I5033 Acute on chronic diastolic (congestive) heart failure: Secondary | ICD-10-CM | POA: Diagnosis not present

## 2023-07-07 DIAGNOSIS — D509 Iron deficiency anemia, unspecified: Secondary | ICD-10-CM | POA: Diagnosis not present

## 2023-07-07 DIAGNOSIS — E785 Hyperlipidemia, unspecified: Secondary | ICD-10-CM | POA: Diagnosis not present

## 2023-07-07 DIAGNOSIS — E1169 Type 2 diabetes mellitus with other specified complication: Secondary | ICD-10-CM | POA: Diagnosis not present

## 2023-07-07 DIAGNOSIS — I5031 Acute diastolic (congestive) heart failure: Secondary | ICD-10-CM | POA: Diagnosis not present

## 2023-07-07 DIAGNOSIS — I330 Acute and subacute infective endocarditis: Secondary | ICD-10-CM | POA: Diagnosis not present

## 2023-07-07 DIAGNOSIS — B3781 Candidal esophagitis: Secondary | ICD-10-CM | POA: Diagnosis not present

## 2023-07-07 DIAGNOSIS — I10 Essential (primary) hypertension: Secondary | ICD-10-CM | POA: Diagnosis not present

## 2023-07-07 LAB — BASIC METABOLIC PANEL
Anion gap: 14 (ref 5–15)
BUN: 15 mg/dL (ref 6–20)
CO2: 22 mmol/L (ref 22–32)
Calcium: 8.5 mg/dL — ABNORMAL LOW (ref 8.9–10.3)
Chloride: 93 mmol/L — ABNORMAL LOW (ref 98–111)
Creatinine, Ser: 0.73 mg/dL (ref 0.61–1.24)
GFR, Estimated: >60 mL/min (ref >60)
Glucose, Bld: 119 mg/dL — ABNORMAL HIGH (ref 70–99)
Potassium: 4.1 mmol/L (ref 3.5–5.1)
Sodium: 129 mmol/L — ABNORMAL LOW (ref 135–145)

## 2023-07-07 LAB — CBC
HCT: 21.6 % — ABNORMAL LOW (ref 39.0–52.0)
Hemoglobin: 7 g/dL — ABNORMAL LOW (ref 13.0–17.0)
MCH: 29.5 pg (ref 26.0–34.0)
MCHC: 32.4 g/dL (ref 30.0–36.0)
MCV: 91.1 fL (ref 80.0–100.0)
Platelets: 163 10*3/uL (ref 150–400)
RBC: 2.37 MIL/uL — ABNORMAL LOW (ref 4.22–5.81)
RDW: 15.3 % (ref 11.5–15.5)
WBC: 8.8 10*3/uL (ref 4.0–10.5)
nRBC: 0 % (ref 0.0–0.2)

## 2023-07-07 LAB — GLUCOSE, CAPILLARY
Glucose-Capillary: 118 mg/dL — ABNORMAL HIGH (ref 70–99)
Glucose-Capillary: 110 mg/dL — ABNORMAL HIGH (ref 70–99)
Glucose-Capillary: 134 mg/dL — ABNORMAL HIGH (ref 70–99)
Glucose-Capillary: 128 mg/dL — ABNORMAL HIGH (ref 70–99)

## 2023-07-07 LAB — PROTIME-INR
INR: 1.8 — ABNORMAL HIGH (ref 0.8–1.2)
Prothrombin Time: 20.8 s — ABNORMAL HIGH (ref 11.4–15.2)

## 2023-07-07 LAB — AEROBIC/ANAEROBIC CULTURE W GRAM STAIN (SURGICAL/DEEP WOUND)
Culture: NO GROWTH
Gram Stain: NONE SEEN

## 2023-07-07 LAB — HEPARIN LEVEL (UNFRACTIONATED): Heparin Unfractionated: 0.37 [IU]/mL (ref 0.30–0.70)

## 2023-07-07 MED ORDER — LOSARTAN POTASSIUM 25 MG PO TABS
25.0000 mg | ORAL_TABLET | Freq: Every day | ORAL | Status: DC
  Administered 2023-07-08 – 2023-07-14 (×7): 25 mg via ORAL
  Filled 2023-07-07 (×7): qty 1

## 2023-07-07 MED ORDER — POTASSIUM CHLORIDE CRYS ER 20 MEQ PO TBCR
20.0000 meq | EXTENDED_RELEASE_TABLET | Freq: Three times a day (TID) | ORAL | Status: DC
  Administered 2023-07-07 – 2023-07-08 (×4): 20 meq via ORAL
  Filled 2023-07-07 (×4): qty 1

## 2023-07-07 MED ORDER — SPIRONOLACTONE 12.5 MG HALF TABLET
12.5000 mg | ORAL_TABLET | Freq: Every day | ORAL | Status: DC
  Administered 2023-07-08 – 2023-07-14 (×7): 12.5 mg via ORAL
  Filled 2023-07-07 (×7): qty 1

## 2023-07-07 MED ORDER — SODIUM CHLORIDE 0.9% FLUSH
10.0000 mL | Freq: Two times a day (BID) | INTRAVENOUS | Status: DC
  Administered 2023-07-08: 20 mL
  Administered 2023-07-08 – 2023-07-09 (×2): 10 mL
  Administered 2023-07-10: 40 mL
  Administered 2023-07-13: 10 mL

## 2023-07-07 MED ORDER — SODIUM CHLORIDE 0.9% FLUSH: 10.0000 mL | INTRAVENOUS | Status: DC | PRN

## 2023-07-07 MED ORDER — POTASSIUM CHLORIDE CRYS ER 20 MEQ PO TBCR: 20.0000 meq | EXTENDED_RELEASE_TABLET | Freq: Three times a day (TID) | ORAL | Status: DC

## 2023-07-07 NOTE — Progress Notes (Addendum)
[address]       [name] [address]             [phone]      5 Days Post-Op  Mitral valve replacement and resection of subaortic membrane and septal myectomy with subsequent take back to OR for exploration for bleeding.  Subjective:  Resting in bed. No new concerns.  Declined PICC placement when offered by the IV team yesterday--said he wanted to wait until today or tomorrow.   On RA Heparin infusing.  UO 4.5L with torsemide yesterday  Objective: Vital signs in last 24 hours: Temp:  [98 F (36.7 C)-98.6 F (37 C)] 98 F (36.7 C) (12/15 0338) Pulse Rate:  [66-69] 69 (12/15 0338) Cardiac Rhythm: Normal sinus rhythm;Bundle branch block (12/14 1900) Resp:  [17-20] 17 (12/15 0338) BP: (106-117)/(52-65) 111/53 (12/15 0338) SpO2:  [96 %-98 %] 96 % (12/15 0338) Weight:  [91.3 kg] 91.3 kg (12/15 0226)  Hemodynamic parameters for last 24 hours:    Intake/Output from previous day: 12/14 0701 - 12/15 0700 In: 1852 [P.O.:240; I.V.:486.1; IV Piggyback:1125.9] Out: 4500 [Urine:4500] Intake/Output this shift: No intake/output data recorded.  General appearance: alert, cooperative, and no distress Neurologic: intact Heart: RRR, no significant arrhythmias Lungs: breaths sounds clear on the left, diminished on the right. CXR yesterday showed improvement in bibasilar effusions / ATX Abdomen: soft, no tenderness Extremities: 1+ LE edema, has 2 peripheral IV's, no PICC yet.   Lab Results: Recent Labs    07/06/23 0245 07/07/23 0335  WBC 11.9* 8.8  HGB 7.6* 7.0*  HCT 23.7* 21.6*  PLT 133* 163   BMET:  Recent Labs    07/06/23 0245 07/07/23 0335  NA 127* 129*  K 4.6 4.1  CL 93* 93*  CO2 21* 22  GLUCOSE 125* 119*  BUN 17 15  CREATININE 0.70 0.73  CALCIUM 8.6* 8.5*    PT/INR:  Recent Labs    07/07/23 0335  LABPROT 20.8*  INR 1.8*   ABG    Component Value Date/Time   PHART 7.401 07/03/2023 0800   HCO3 20.1 07/03/2023 0800   TCO2 21  (L) 07/03/2023 0800   ACIDBASEDEF 4.0 (H) 07/03/2023 0800   O2SAT 99 07/03/2023 0800   CBG (last 3)  Recent Labs    07/06/23 1709 07/06/23 2056 07/07/23 0558  GLUCAP 114* 129* 118*    Assessment/Plan: S/P Procedure(s) (LRB): EXPLORATION POST OPERATIVE OPEN HEART (N/A)  -POD5 MV replacement with a 29mm St. Jude mechanical valve with resection of subaortic membrane and septal myectomy. VS and cardiac rhythm stable. On metoprolol, statin, ASA, and prophylactic amiodarone.  Coumadin dosing per pharmacy , INR 1.3- 1.8 over past 24 hours. Progressing with mobility. Good response to torsemide yesterday with 4.5L urine output measured. Wt decreased by ~5kg.  -ID: MV endocarditis- Cultured strep salivarius sensitive to PCN. To continue IV PCN-G x 4 weeks post-op per ID recommendations.  Requested PICC but patient declined having it done yesterday. Advised patient we need to get this accomplished when IV team is available to do it. OPAT orders in place.   -Right pleural effusion : improved on PA / Lat CXR yesterday.  Continue diuresis, pulmonary hygiene.  -GI- esophageal candidiasis diagnosed in ICU. On Diflucan  day 10 of 14-day course. Tolerating PO, having bowel movements.  -Expected acute blood loss anemia- Hct drifted down further to 21%.  Tolerating OK. On Fe++ replacement. Monitoring.   -Hyponatremia-Na+ trending up 127->129. Monitoring.  -Disposition- Progressing  with mobility. Anticipate discharge to home with his son early next week.  TOC team working on [facility]  and services for home infusion.     LOS: 19 days    Victor [name] [phone] 07/07/2023   Chart reviewed, patient examined, agree with above.  He is diuresing well on Demedex. Wt is 9 lbs over preop. Would continue this for another couple days. Increase Kdur to 20 tid.  INR up to 1.8. Pharmacy dosing Coumadin. DC heparin when 2.0. Be careful on amio and diflucan.  Continuing antibiotics per ID.

## 2023-07-07 NOTE — Progress Notes (Signed)
Mobility Specialist Progress Note    07/07/23 1616  Mobility  Activity Ambulated with assistance in hallway  Level of Assistance Minimal assist, patient does 75% or more  Assistive Device Front wheel walker  Distance Ambulated (ft) 180 ft  Activity Response Tolerated well  Mobility Referral Yes  Mobility visit 1 Mobility  Mobility Specialist Start Time (ACUTE ONLY) 1559  Mobility Specialist Stop Time (ACUTE ONLY) 1615  Mobility Specialist Time Calculation (min) (ACUTE ONLY) 16 min   Pre-Mobility: 66 HR  Pt received in bed and agreeable. Pt requesting some pain meds for shoulder pain when sitting EOB. Returned to bed with call bell in reach. RN notified.   [place] Mobility Specialist  Please Neurosurgeon or Rehab Office at [phone]

## 2023-07-07 NOTE — Progress Notes (Signed)
Peripherally Inserted Central Catheter Placement  The IV Nurse has discussed with the patient and/or persons authorized to consent for the patient, the purpose of this procedure and the potential benefits and risks involved with this procedure.  The benefits include less needle sticks, lab draws from the catheter, and the patient may be discharged home with the catheter. Risks include, but not limited to, infection, bleeding, blood clot (thrombus formation), and puncture of an artery; nerve damage and irregular heartbeat and possibility to perform a PICC exchange if needed/ordered by physician.  Alternatives to this procedure were also discussed.  Bard Power PICC patient education guide, fact sheet on infection prevention and patient information card has been provided to patient /or left at bedside.    PICC Placement Documentation  PICC Single Lumen 07/07/23 Right Brachial 0 cm (Active)  Indication for Insertion or Continuance of Line Home intravenous therapies (PICC only) 07/07/23 1504  Exposed Catheter (cm) 0 cm 07/07/23 1504  Site Assessment Clean, Dry, Intact 07/07/23 1504  Line Status Saline locked;Blood return noted 07/07/23 1504  Dressing Type Transparent;Securing device 07/07/23 1504  Dressing Status Antimicrobial disc in place;Clean, Dry, Intact 07/07/23 1504  Line Care Connections checked and tightened 07/07/23 1504  Line Adjustment (NICU/IV Team Only) No 07/07/23 1504  Dressing Intervention New dressing;Adhesive placed at insertion site (IV team only) 07/07/23 1504  Dressing Change Due 07/14/23 07/07/23 1504       Burnard Bunting Chenice 07/07/2023, 3:05 PM

## 2023-07-07 NOTE — Progress Notes (Addendum)
Progress Note   Patient: Victor [name] VHQ:[id number] DOB: September 05, 1970 DOA: 06/17/2023     19 DOS: the patient was seen and examined on 07/07/2023   Brief hospital course: Mr. Victor [name] was admitted to the hospital with the working diagnosis of heart failure exacerbation.   52 years old male with past medical history of hyperlipidemia, obesity, hypertension presented to hospital with exertional shortness of breath, bilateral lower extremity edema and pain for the last 2 weeks.  Patient also complains of shortness of breath at rest and has been having difficulty lying down in bed. 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  Cardiothoracic surgery was consulted and they feel that his valve will need to be replaced but want him to go under further testing for likely HOCM.  Cardiac catheterization done and he had minimal nonobstructive CAD with elevated V waves and filling pressures as well as a subaortic gradient with increased PVCs suggesting potential hypertrophic cardiomyopathy.   Cardiac MRI moderate asymmetric basal to mid septal hypertrophy. Suspect LV outflow gradient given narrowed LVOT and turbulence in LVOT.  Positive mitral valve cordal SAM or MV SAM Moderate to severe mitral regurgitation.  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   Underwent MVR 12/10 with Dr. 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   Post operative  admitted to the ICU, on mechanical ventilatory support.   12/11: extubated early AM POD 1. Pressors off. Tachypneic with small breaths. +diuretics. Dc swan and a-line   12/14 transferred back to [facility].  12/15 clinically stable, will need outpatient antibiotic therapy, he would like to go to short term rehab.   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  Sp mitral valve replacement, mechanical prosthesis St Jude.   Plan to continue antibiotics with Penicillin to complete 4 weeks post operative.  Patient will need PICC line for outpatient antibiotic therapy.   Post operative right pleural effusion/ atelectasis.  Continue diuresis and incentive spirometer.   Anticoagulation with warfarin and bridge with IV heparin  Post operative amiodarone to prevent arrhythmias.   Acute on chronic diastolic CHF (congestive heart failure) (HCC) Echocardiogram with preserved LV systolic function with EF 70 to 75%, narrow LVOT with mid cavity gradient peak 48 mmHg. Positive mild systolic anterior motion of the mitral valve, mild LVH, RV systolic function preserved, LA with moderate dilatation, moderate mitral valve stenosis, moderate aortic valve stenosis,   TEE with no aortic stenosis, positive SAM of the chordal and subaortic membrane. Severe mitral valve regurgitation with positive vegetation, mild MV stenosis,.   Continue metoprolol for B blocker Diuresis with torsemide 40 mg po daily.  Will start patient on low dose spironolactone, exchange amlodipine for losartan.   HTN (hypertension) Continue blood pressure monitoring.  Continue with metoprolol, dc amlodipine and start patient on losartan for RAS inhibition.    Iron deficiency anemia Post operative anemia, acute blood loss.  Hgb has been stable with no current indication for PRBC transfusion.  Continue with oral iron supplementation.  Esophageal candidiasis (HCC) Continue  with fluconazole, plan for 14 days of therapy.   Hyponatremia Improved volume status, renal function today with serum cr at 0,73 with K at 4.1 and serum bicarbonate at 22.  Na 129   Plan to continue diuresis with torsemide.  Follow up renal function and electrolytes in am.   Type 2 diabetes mellitus with hyperlipidemia (HCC) Continue glucose cover and monitoring with insulin sliding scale Glucose has been stable, with fasting glucose today 119 mg/dl.  Continue with rosuvastatin.         Subjective: Patient is feeling better dyspnea is improving, no chest pain, continue very weak and deconditioned and he would like to do rehab before going home.   Physical Exam: Vitals:   07/06/23 2335 07/07/23 0226 07/07/23 0338 07/07/23 0824  BP: (!) 106/52  (!) 111/53 122/61  Pulse: 67  69 72  Resp: 18  17 20   Temp: 98 F (36.7 C)  98 F (36.7 C) 97.6 F (36.4 C)  TempSrc: Oral  Oral Oral  SpO2: 98%  96% 97%  Weight:  91.3 kg    Height:       Neurology awake and alert ENT with mild pallor Cardiovascular with S1 and S2 present and regular with no gallops, or rubs, positive systolic murmur at the apex Respiratory with no rales or wheezing, no rhonchi Abdomen with no distention  Lower extremity edema +  Data Reviewed:    Family Communication: no family at the bedside   Disposition: Status is: Inpatient Remains inpatient appropriate because: heart failure recovering valve intervention,    Planned Discharge Destination: Skilled nursing facility      Author: Coralie Keens, MD 07/07/2023 11:37 AM  For on call review [email or web address].

## 2023-07-07 NOTE — Progress Notes (Signed)
ANTICOAGULATION CONSULT NOTE  Pharmacy Consult for heparin no bolus + warfarin Indication: s/p mechanical MVR 12/10  Allergies  Allergen Reactions   Iodinated Contrast Media Itching   Gadolinium Derivatives     Patient Measurements: Height: 5\' 7"  (170.2 cm) Weight: 91.3 kg (201 lb 4.5 oz) IBW/kg (Calculated) : 66.1 Heparin Dosing Weight: 83 kg  Vital Signs: Temp: 98 F (36.7 C) (12/15 0338) Temp Source: Oral (12/15 0338) BP: 111/53 (12/15 0338) Pulse Rate: 69 (12/15 0338)  Labs: Recent Labs    07/05/23 0454 07/05/23 1304 07/06/23 0244 07/06/23 0245 07/06/23 0951 07/07/23 0335  HGB 6.9*  --   --  7.6*  --  7.0*  HCT 21.2*  --   --  23.7*  --  21.6*  PLT 107*  --   --  133*  --  163  LABPROT 15.4*  --   --  16.0*  --  20.8*  INR 1.2  --   --  1.3*  --  1.8*  HEPARINUNFRC 0.17*   < > 0.37  --  0.42 0.37  CREATININE 0.62  --   --  0.70  --  0.73   < > = values in this interval not displayed.    Estimated Creatinine Clearance: 113.8 mL/min (by C-G formula based on SCr of 0.73 mg/dL).  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  Not on anticoagulation prior to admission.  Pharmacy consulted for heparin and warfarin dosing starting POD#2 (12/12).  Of note, D#8/14 of fluconazole for esophageal candidiasis and started on postop amio ppx 12/12 which will both reduce warfarin metabolism / enhance anticoagulant effect.  Daily heparin level 0.37, therapeutic with heparin infusion at 1800 units/h. INR 1.8 today, large increase from 1.3 but still subtherapeutic for INR goal. Hgb 7.6>7.0, decreased, Plt 133>163 increased. Still on amio and fluconazole - likely contributing to INR increase. Patient eating 50% of meals and no signs of bleeding reported  per RN.  Goal of Therapy:  INR 2.5-3.5 Heparin level 0.3-0.7 units/ml Monitor platelets by anticoagulation protocol: Yes   Plan:  Continue heparin infusion at 1800 units/hr Hold Warfarin dose tonight Monitor daily heparin level, INR and CBC Monitor appetite and drug interactions  Stephenie Acres, PharmD PGY1 Pharmacy Resident 07/07/2023 7:50 AM

## 2023-07-08 DIAGNOSIS — I059 Rheumatic mitral valve disease, unspecified: Secondary | ICD-10-CM | POA: Diagnosis not present

## 2023-07-08 DIAGNOSIS — B3781 Candidal esophagitis: Secondary | ICD-10-CM | POA: Diagnosis not present

## 2023-07-08 DIAGNOSIS — E1169 Type 2 diabetes mellitus with other specified complication: Secondary | ICD-10-CM | POA: Diagnosis not present

## 2023-07-08 DIAGNOSIS — I5033 Acute on chronic diastolic (congestive) heart failure: Secondary | ICD-10-CM | POA: Diagnosis not present

## 2023-07-08 DIAGNOSIS — E785 Hyperlipidemia, unspecified: Secondary | ICD-10-CM | POA: Diagnosis not present

## 2023-07-08 DIAGNOSIS — E871 Hypo-osmolality and hyponatremia: Secondary | ICD-10-CM | POA: Diagnosis not present

## 2023-07-08 DIAGNOSIS — D509 Iron deficiency anemia, unspecified: Secondary | ICD-10-CM | POA: Diagnosis not present

## 2023-07-08 DIAGNOSIS — I10 Essential (primary) hypertension: Secondary | ICD-10-CM | POA: Diagnosis not present

## 2023-07-08 LAB — PROTIME-INR
INR: 2 — ABNORMAL HIGH (ref 0.8–1.2)
Prothrombin Time: 23.3 s — ABNORMAL HIGH (ref 11.4–15.2)

## 2023-07-08 LAB — GLUCOSE, CAPILLARY
Glucose-Capillary: 112 mg/dL — ABNORMAL HIGH (ref 70–99)
Glucose-Capillary: 136 mg/dL — ABNORMAL HIGH (ref 70–99)
Glucose-Capillary: 115 mg/dL — ABNORMAL HIGH (ref 70–99)
Glucose-Capillary: 120 mg/dL — ABNORMAL HIGH (ref 70–99)

## 2023-07-08 LAB — MAGNESIUM: Magnesium: 2 mg/dL (ref 1.7–2.4)

## 2023-07-08 LAB — CBC
HCT: 21.1 % — ABNORMAL LOW (ref 39.0–52.0)
Hemoglobin: 6.7 g/dL — CL (ref 13.0–17.0)
MCH: 29.1 pg (ref 26.0–34.0)
MCHC: 31.8 g/dL (ref 30.0–36.0)
MCV: 91.7 fL (ref 80.0–100.0)
Platelets: 176 10*3/uL (ref 150–400)
RBC: 2.3 MIL/uL — ABNORMAL LOW (ref 4.22–5.81)
RDW: 15.5 % (ref 11.5–15.5)
WBC: 8.6 10*3/uL (ref 4.0–10.5)
nRBC: 0 % (ref 0.0–0.2)

## 2023-07-08 LAB — BASIC METABOLIC PANEL
Anion gap: 8 (ref 5–15)
BUN: 15 mg/dL (ref 6–20)
CO2: 24 mmol/L (ref 22–32)
Calcium: 8.5 mg/dL — ABNORMAL LOW (ref 8.9–10.3)
Chloride: 96 mmol/L — ABNORMAL LOW (ref 98–111)
Creatinine, Ser: 0.79 mg/dL (ref 0.61–1.24)
GFR, Estimated: >60 mL/min (ref >60)
Glucose, Bld: 117 mg/dL — ABNORMAL HIGH (ref 70–99)
Potassium: 4.5 mmol/L (ref 3.5–5.1)
Sodium: 128 mmol/L — ABNORMAL LOW (ref 135–145)

## 2023-07-08 LAB — HEMOGLOBIN AND HEMATOCRIT, BLOOD
HCT: 23.7 % — ABNORMAL LOW (ref 39.0–52.0)
Hemoglobin: 7.7 g/dL — ABNORMAL LOW (ref 13.0–17.0)

## 2023-07-08 LAB — HEPARIN LEVEL (UNFRACTIONATED): Heparin Unfractionated: 0.51 [IU]/mL (ref 0.30–0.70)

## 2023-07-08 LAB — PREPARE RBC (CROSSMATCH)

## 2023-07-08 MED ORDER — OXYCODONE HCL 5 MG PO TABS
5.0000 mg | ORAL_TABLET | ORAL | Status: DC | PRN
Start: 2023-07-08 — End: 2023-07-14
  Administered 2023-07-08 – 2023-07-12 (×10): 5 mg via ORAL
  Filled 2023-07-08 (×10): qty 1

## 2023-07-08 MED ORDER — WARFARIN SODIUM 2.5 MG PO TABS
2.5000 mg | ORAL_TABLET | Freq: Once | ORAL | Status: AC
  Administered 2023-07-08: 2.5 mg via ORAL
  Filled 2023-07-08: qty 1

## 2023-07-08 MED ORDER — SODIUM CHLORIDE 0.9% IV SOLUTION: Freq: Once | INTRAVENOUS | Status: AC

## 2023-07-08 MED ORDER — TRAMADOL HCL 50 MG PO TABS
50.0000 mg | ORAL_TABLET | ORAL | Status: DC | PRN
  Administered 2023-07-09 – 2023-07-14 (×7): 50 mg via ORAL
  Filled 2023-07-08 (×7): qty 1

## 2023-07-08 NOTE — Progress Notes (Signed)
Pt just ambulated with MT. In recliner. Discussed IS (750 ml currently), sternal precautions, exercise, and CRPII. Pt receptive. Will refer to [place] CRPII. Gave HH diet. He sts he only chews tobacco a couple times a week but is thinking of quitting. Resources given. Pt has ambulated x2 today. PT to see later. Will f/u tomorrow.  [id number] Ethelda Chick BS, ACSM-CEP 07/08/2023 11:43 AM

## 2023-07-08 NOTE — Progress Notes (Signed)
ANTICOAGULATION CONSULT NOTE  Pharmacy Consult for heparin no bolus + warfarin > warfarin only Indication: s/p mechanical MVR 12/10  Allergies  Allergen Reactions   Iodinated Contrast Media Itching   Gadolinium Derivatives     Patient Measurements: Height: 5\' 7"  (170.2 cm) Weight: 91.2 kg (201 lb 1 oz) IBW/kg (Calculated) : 66.1 Heparin Dosing Weight: 83 kg  Vital Signs: Temp: 98.3 F (36.8 C) (12/16 0754) Temp Source: Oral (12/16 0754) BP: 134/71 (12/16 0754) Pulse Rate: 74 (12/16 0754)  Labs: Recent Labs    07/06/23 0245 07/06/23 0951 07/07/23 0335 07/08/23 0320  HGB 7.6*  --  7.0* 6.7*  HCT 23.7*  --  21.6* 21.1*  PLT 133*  --  163 176  LABPROT 16.0*  --  20.8* 23.3*  INR 1.3*  --  1.8* 2.0*  HEPARINUNFRC  --  0.42 0.37 0.51  CREATININE 0.70  --  0.73 0.79    Estimated Creatinine Clearance: 113.6 mL/min (by C-G formula based on SCr of 0.79 mg/dL).  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  Not on anticoagulation prior to admission.  Pharmacy consulted for heparin and warfarin dosing starting POD#2 (12/12)  Of note, D#11/14 of fluconazole for esophageal candidiasis and started on postop amio ppx 12/12 which will both reduce warfarin metabolism / enhance anticoagulant effect.  INR bumped slightly to 2.0 today after holding warfarin dose yesterday, but still subtherapeutic for INR goal. Still on amio and fluconazole - likely contributing to INR increase. No meal documentation. Will give lower warfarin dose as INR increased s/p holding warfarin x1. Daily heparin level 0.51, therapeutic with heparin infusion at 1800 units/h. No issues with infusion per RN. As INR is 2.0, [facility]VS has discontinued heparin gtt.   Hgb 7.6>7.0>6.7, no s/sx of  bleeding per RN. Per notes, more concerned if Hgb < 6.  Goal of Therapy:  INR 2.5-3.5 Monitor platelets by anticoagulation protocol: Yes   Plan:  Discontinue heparin infusion Warfarin 2.5mg  x1 tonight Monitor daily INR and CBC Monitor appetite and drug interactions  Nicole Kindred, PharmD PGY1 Pharmacy Resident 07/08/2023 7:58 AM

## 2023-07-08 NOTE — Progress Notes (Signed)
Critical hemoglobin 6.7, vss, per MD order don't call unless hemoglobin less than 6

## 2023-07-08 NOTE — TOC Progression Note (Addendum)
Transition of Care (TOC) - Progression Note  Donn Pierini RN, BSN Transitions of Care Unit 4E- RN Case Manager See Treatment Team for direct phone #   Patient Details  Name: Victor [name] MRN: [id number] Date of Birth: June 11, 1971  Transition of Care [facility]) CM/SW Contact  Zenda Alpers Lenn Sink, RN Phone Number: 07/08/2023, 1:17 PM  Clinical Narrative:    Noted pt requesting possible SNF placement- see CSW note.   CM spoke with pt at bedside- explained that current recommendations where for [facility] and that we may not get insurance approval for SNF- we would also have to check and see if he has SNF benefits- pt voiced understanding.  Per pt he rents a room and does not want to return there as his friend smokes and has dogs and he is worried it will not be a good environment for home IV abx.  Pt also voiced that he could go home w/ his son, however his son works and would not be home 24/7. - Explained to pt that it is not noted that he need 24/7 care/assist.   Pt voiced that his son lives in [place]- but does not know his address-  Son's phone # is [phone]- [name].   Address for pt confirmed in epic- correct phone # is - [phone]   Discussed that home infusion agency [facility] could come provide education and answer questions- pt agreeable - CM has reached out to [facility] w/ [facility] to arrange bedside education- [facility] nurse plans to make visit later today around 4pm. PICC line has been placed.   HHPT also ordered- and list provided to pt for choice Per CMS guidelines from [email or web address] website with star ratings (copy placed in shadow chart)- pt voiced he does not have a preference- CM will see if agency can be secured for HHPT- with plan for [facility] to provide [facility] w/ IV abx needs.  Call made to [facility] for HHPT referral- referral has been accepted for HHPT only- may be a delayed start of care pending d/c date.   TOC to continue to follow for transition  needs SNF vs [facility]   Expected Discharge Plan: Skilled Nursing Facility Barriers to Discharge: English as a second language teacher, SNF Pending bed offer  Expected Discharge Plan and Services In-house Referral: Clinical Social Work Discharge Planning Services: CM Consult Post Acute Care Choice: Home Health, Durable Medical Equipment Living arrangements for the past 2 months: Single Family Home                 DME Arranged: Walker rolling         HH Arranged: RN, IV Antibiotics HH Agency: Other - See comment ([facility]) Date HH Agency Contacted: 07/05/23 Time HH Agency Contacted: 1413 Representative spoke with at [facility] Agency: Santina Evans   Social Determinants of Health (SDOH) Interventions SDOH Screenings   Food Insecurity: Food Insecurity Present (06/18/2023)  Housing: Medium Risk (06/18/2023)  Transportation Needs: Unmet Transportation Needs (06/18/2023)  Utilities: Not At Risk (06/18/2023)  Depression (PHQ2-9): Medium Risk (05/07/2023)  Tobacco Use: High Risk (07/02/2023)    Readmission Risk Interventions     No data to display

## 2023-07-08 NOTE — Progress Notes (Signed)
Pt ambulated x 200 feet with front wheel walker, slow steady gate.

## 2023-07-08 NOTE — NC FL2 (Signed)
[place] MEDICAID [id number] LEVEL OF CARE FORM     IDENTIFICATION  Patient Name: Victor [name] Birthdate: Dec 25, 1970 Sex: male Admission Date (Current Location): 06/17/2023  [facility] and [place] Number:  Producer, television/film/video and Address:  [place], [address], [place], [place] [address]      Provider Number: [id number]  Attending Physician Name and Address:  Coralie Keens  Relative Name and Phone Number:       Current Level of Care: Hospital Recommended Level of Care: Skilled Nursing Facility Prior Approval Number:    Date Approved/Denied:   PASRR Number: [id number]  Discharge Plan: SNF    Current Diagnoses: Patient Active Problem List   Diagnosis Date Noted   Esophageal candidiasis (HCC) 07/06/2023   Hyponatremia 07/06/2023   Type 2 diabetes mellitus with hyperlipidemia (HCC) 07/06/2023   S/P mitral valve replacement 07/02/2023   Heart failure (HCC) 07/02/2023   Benign neoplasm of colon 06/28/2023   Severe mitral regurgitation 06/27/2023   Endocarditis of mitral valve 06/27/2023   Iron deficiency anemia 06/27/2023   Abnormal liver diagnostic imaging 06/27/2023   Elevated liver enzymes 06/27/2023   Acute on chronic diastolic CHF (congestive heart failure) (HCC) 06/18/2023   Shortness of breath 06/18/2023   Lumbar back pain with radiculopathy affecting lower extremity 05/07/2023   S/P hernia repair 02/03/2021   Inguinal hernia 02/03/2021   HTN (hypertension) 12/06/2014   Dyspnea 10/16/2013   Hypertriglyceridemia    Dizziness 10/15/2013   Weakness 10/15/2013   Abnormal EKG 10/15/2013    Orientation RESPIRATION BLADDER Height & Weight     Self, Time, Situation, Place  Normal Continent Weight: 201 lb 1 oz (91.2 kg) Height:  5\' 7"  (170.2 cm)  BEHAVIORAL SYMPTOMS/MOOD NEUROLOGICAL BOWEL NUTRITION STATUS      Continent Diet (please see dischargre summary)  AMBULATORY STATUS COMMUNICATION OF NEEDS Skin   Supervision Verbally  Surgical wounds (closed incision chest)                       Personal Care Assistance Level of Assistance  Bathing, Feeding, Dressing Bathing Assistance: Independent Feeding assistance: Independent Dressing Assistance: Independent     Functional Limitations Info  Sight, Hearing, Speech Sight Info: Adequate Hearing Info: Adequate Speech Info: Adequate    SPECIAL CARE FACTORS FREQUENCY                       Contractures Contractures Info: Not present    Additional Factors Info  Code Status, Allergies Code Status Info: FULL Allergies Info: Iodinated Contrast Media,Gadolinium Derivatives           Current Medications (07/08/2023):  This is the current hospital active medication list Current Facility-Administered Medications  Medication Dose Route Frequency Provider Last Rate Last Admin   albuterol (PROVENTIL) (2.5 MG/3ML) 0.083% nebulizer solution 2.5 mg  2.5 mg Nebulization Q4H PRN Stehler, Oren Bracket, PA-C       amiodarone (PACERONE) tablet 400 mg  400 mg Oral BID Eugenio Hoes, MD   400 mg at 07/08/23 5621   bisacodyl (DULCOLAX) EC tablet 10 mg  10 mg Oral Daily Jenny Reichmann, PA-C   10 mg at 07/08/23 3086   Or   bisacodyl (DULCOLAX) suppository 10 mg  10 mg Rectal Daily Stehler, Oren Bracket, PA-C       Chlorhexidine Gluconate Cloth 2 % PADS 6 each  6 each Topical Daily Eugenio Hoes, MD   6 each at 07/08/23  6578   diphenhydrAMINE-zinc acetate (BENADRYL) 2-0.1 % cream   Topical BID PRN Aloha Gell C, PA-C       docusate sodium (COLACE) capsule 200 mg  200 mg Oral Daily Jenny Reichmann, PA-C   200 mg at 07/08/23 4696   ferrous gluconate (FERGON) tablet 324 mg  324 mg Oral Q breakfast Alleen Borne, MD   324 mg at 07/08/23 2952   fluconazole (DIFLUCAN) tablet 200 mg  200 mg Oral Daily Jenny Reichmann, PA-C   200 mg at 07/08/23 8413   folic acid (FOLVITE) tablet 1 mg  1 mg Oral Daily Jenny Reichmann, PA-C   1 mg at 07/08/23 2440   gabapentin  (NEURONTIN) capsule 300 mg  300 mg Oral TID Aloha Gell C, PA-C   300 mg at 07/08/23 1027   insulin aspart (novoLOG) injection 0-15 Units  0-15 Units Subcutaneous TID AC & HS Eugenio Hoes, MD   2 Units at 07/08/23 1212   losartan (COZAAR) tablet 25 mg  25 mg Oral Daily Arrien, York Ram, MD   25 mg at 07/08/23 2536   melatonin tablet 3 mg  3 mg Oral QHS Lorin Glass, MD   3 mg at 07/07/23 2136   methocarbamol (ROBAXIN) tablet 500 mg  500 mg Oral TID Jenny Reichmann, PA-C   500 mg at 07/08/23 6440   metoprolol tartrate (LOPRESSOR) injection 2.5-5 mg  2.5-5 mg Intravenous Q2H PRN Jenny Reichmann, PA-C       metoprolol tartrate (LOPRESSOR) tablet 25 mg  25 mg Oral BID Eugenio Hoes, MD   25 mg at 07/08/23 3474   multivitamin with minerals tablet 1 tablet  1 tablet Oral Daily Jenny Reichmann, PA-C   1 tablet at 07/08/23 2595   nicotine (NICODERM CQ - dosed in mg/24 hours) patch 14 mg  14 mg Transdermal Daily Eugenio Hoes, MD   14 mg at 07/08/23 0809   ondansetron (ZOFRAN) injection 4 mg  4 mg Intravenous Q6H PRN Jenny Reichmann, PA-C       Oral care mouth rinse  15 mL Mouth Rinse PRN Eugenio Hoes, MD       oxyCODONE (Oxy IR/ROXICODONE) immediate release tablet 5-10 mg  5-10 mg Oral Q3H PRN Aloha Gell C, PA-C   10 mg at 07/08/23 0814   pantoprazole (PROTONIX) EC tablet 40 mg  40 mg Oral Daily Jenny Reichmann, PA-C   40 mg at 07/08/23 6387   penicillin G potassium 12 Million Units in dextrose 5 % 500 mL CONTINUOUS infusion  12 Million Units Intravenous Q12H Jenny Reichmann, PA-C 41.7 mL/hr at 07/08/23 0100 12 Million Units at 07/08/23 0100   potassium chloride SA (KLOR-CON M) CR tablet 20 mEq  20 mEq Oral TID Coralie Keens, MD   20 mEq at 07/08/23 5643   rosuvastatin (CRESTOR) tablet 20 mg  20 mg Oral Daily Eugenio Hoes, MD   20 mg at 07/08/23 3295   sodium chloride flush (NS) 0.9 % injection 10-40 mL  10-40 mL Intracatheter PRN Arrien, York Ram, MD        sodium chloride flush (NS) 0.9 % injection 10-40 mL  10-40 mL Intracatheter Q12H Arrien, York Ram, MD   10 mL at 07/08/23 0809   sodium chloride flush (NS) 0.9 % injection 3 mL  3 mL Intravenous Q12H Stehler, Bailey C, PA-C   3 mL at 07/08/23 0809   sodium chloride flush (NS) 0.9 % injection 3 mL  3 mL Intravenous PRN Jenny Reichmann, PA-C       spironolactone (ALDACTONE) tablet 12.5 mg  12.5 mg Oral Daily Arrien, York Ram, MD   12.5 mg at 07/08/23 4696   thiamine (VITAMIN B1) tablet 100 mg  100 mg Oral Daily Jenny Reichmann, PA-C   100 mg at 07/08/23 2952   torsemide (DEMADEX) tablet 40 mg  40 mg Oral Daily Alleen Borne, MD   40 mg at 07/08/23 8413   traMADol (ULTRAM) tablet 50-100 mg  50-100 mg Oral Q4H PRN Aloha Gell C, PA-C   100 mg at 07/08/23 0327   traZODone (DESYREL) tablet 100 mg  100 mg Oral QHS Lorin Glass, MD   100 mg at 07/07/23 2137   warfarin (COUMADIN) tablet 2.5 mg  2.5 mg Oral ONCE-1600 Jenita Seashore, [facility]       Warfarin - Pharmacist Dosing Inpatient   Does not apply [id number] Eugenio Hoes, MD   Given at 07/04/23 1824     Discharge Medications: Please see discharge summary for a list of discharge medications.  Relevant Imaging Results:  Relevant Lab Results:   Additional Information SSN [id number]      IV abx Penicillin 24 million units daily as a continuous infusion end date 07/30/2023  Eduard Roux, LCSW

## 2023-07-08 NOTE — TOC Progression Note (Addendum)
Transition of Care [facility]) - Progression Note    Patient Details  Name: Victor [name] MRN: [id number] Date of Birth: [birth year]  Transition of Care [facility]) CM/SW Contact  Eduard Roux, [place] Phone Number: 07/08/2023, 1:07 PM  Clinical Narrative:     CSW received message from MD- patient wants SNF while on IV abx- CSW met with patient, introduced self and explained role. Patient states he lives in a home w/ a roommate. He reports he has no one in the home that can assist him with IV abx. Patient states he is uncomfortable being in his current home and getting IV abx, he states he is fearful of getting infection, due to home environment. He requested CSW send referrals for SNF but if unable to d/c to SNF, he plans to d/c home w/ his son. CSW explained the SNF process, including possible barriers. Patient states understanding.   TOC will provide bed offers if available, if unable to d/c to SNF, patient will d/c home w/his son.  TOC will continue to follow and assist with discharge planning.  Antony Blackbird, MSW, LCSW Clinical Social Worker    Expected Discharge Plan: Skilled Nursing Facility Barriers to Discharge: English as a second language teacher, SNF Pending bed offer  Expected Discharge Plan and Services In-house Referral: Clinical Social Work Discharge Planning Services: CM Consult Post Acute Care Choice: Home Health, Durable Medical Equipment Living arrangements for the past 2 months: Single Family Home                           HH Arranged: RN, IV Antibiotics HH Agency: Other - See comment ([facility]) Date HH Agency Contacted: 07/05/23 Time HH Agency Contacted: 1413 Representative spoke with at [facility] Agency: Santina Evans   Social Determinants of Health (SDOH) Interventions SDOH Screenings   Food Insecurity: Food Insecurity Present (06/18/2023)  Housing: Medium Risk (06/18/2023)  Transportation Needs: Unmet Transportation Needs (06/18/2023)  Utilities: Not At Risk (06/18/2023)   Depression (PHQ2-9): Medium Risk (05/07/2023)  Tobacco Use: High Risk (07/02/2023)    Readmission Risk Interventions     No data to display

## 2023-07-08 NOTE — Progress Notes (Signed)
Mobility Specialist Progress Note:   07/08/23 1547  Mobility  Activity Transferred from chair to bed  Level of Assistance Contact guard assist, steadying assist  Assistive Device Front wheel walker  Distance Ambulated (ft) 2 ft  RUE Weight Bearing Per Provider Order NWB  LUE Weight Bearing Per Provider Order NWB  Activity Response Tolerated well  Mobility Referral Yes  Mobility visit 1 Mobility  Mobility Specialist Start Time (ACUTE ONLY) 1535  Mobility Specialist Stop Time (ACUTE ONLY) 1548  Mobility Specialist Time Calculation (min) (ACUTE ONLY) 13 min   Pt received in chair, agreeable to transfer back to bed. CG to stand and pivot from chair. VSS. Asx throughout. Pt left in bed with call bell in reach and all needs met.  Leory Plowman  Mobility Specialist Please contact via [employer] office at [phone]

## 2023-07-08 NOTE — Progress Notes (Signed)
Mobility Specialist Progress Note:   07/08/23 1128  Mobility  Activity Ambulated with assistance in hallway  Level of Assistance Contact guard assist, steadying assist  Assistive Device Front wheel walker  Distance Ambulated (ft) 200 ft  RUE Weight Bearing Per Provider Order NWB  LUE Weight Bearing Per Provider Order NWB  Activity Response Tolerated well  Mobility Referral Yes  Mobility visit 1 Mobility  Mobility Specialist Start Time (ACUTE ONLY) 0940  Mobility Specialist Stop Time (ACUTE ONLY) 0950  Mobility Specialist Time Calculation (min) (ACUTE ONLY) 10 min   Pt received EOB, agreeable to mobility. CG to stand and ambulate in hallway. No complaints stated. VSS throughout. Pt left in chair with call bell in reach and all needs met.  Leory Plowman  Mobility Specialist Please contact via [employer] office at [phone]

## 2023-07-08 NOTE — Progress Notes (Addendum)
[address]       [place] [address]             [phone]      6 Days Post-Op Procedure(s) (LRB): EXPLORATION POST OPERATIVE OPEN HEART (N/A) Subjective: The patient has no complaints this AM. Walked early this AM.   Objective: Vital signs in last 24 hours: Temp:  [97.6 F (36.4 C)-98.2 F (36.8 C)] 98.1 F (36.7 C) (12/16 0342) Pulse Rate:  [59-72] 71 (12/16 0342) Cardiac Rhythm: Normal sinus rhythm (12/15 2100) Resp:  [17-20] 19 (12/16 0342) BP: (108-132)/(51-74) 132/74 (12/16 0342) SpO2:  [97 %-100 %] 99 % (12/16 0342) Weight:  [91.2 kg] 91.2 kg (12/16 0342)  Hemodynamic parameters for last 24 hours:    Intake/Output from previous day: 12/15 0701 - 12/16 0700 In: 1672.3 [P.O.:240; I.V.:431.5; IV Piggyback:1000.8] Out: 2450 [Urine:2450] Intake/Output this shift: No intake/output data recorded.  General appearance: alert, cooperative, and no distress Neurologic: intact Heart: regular rate and rhythm, S1, S2 normal, no murmur, click, rub or gallop Lungs: slightly diminished bibasilar breath sounds Abdomen: soft, non-tender; bowel sounds normal; no masses,  no organomegaly Extremities: edema trace Wound: Clean and dry without sign of infection  Lab Results: Recent Labs    07/07/23 0335 07/08/23 0320  WBC 8.8 8.6  HGB 7.0* 6.7*  HCT 21.6* 21.1*  PLT 163 176   BMET:  Recent Labs    07/07/23 0335 07/08/23 0320  NA 129* 128*  K 4.1 4.5  CL 93* 96*  CO2 22 24  GLUCOSE 119* 117*  BUN 15 15  CREATININE 0.73 0.79  CALCIUM 8.5* 8.5*    PT/INR:  Recent Labs    07/08/23 0320  LABPROT 23.3*  INR 2.0*   ABG    Component Value Date/Time   PHART 7.401 07/03/2023 0800   HCO3 20.1 07/03/2023 0800   TCO2 21 (L) 07/03/2023 0800   ACIDBASEDEF 4.0 (H) 07/03/2023 0800   O2SAT 99 07/03/2023 0800   CBG (last 3)  Recent Labs    07/07/23 1641 07/07/23 2052 07/08/23 0541  GLUCAP 134* 128* 112*    Assessment/Plan: S/P  Procedure(s) (LRB): EXPLORATION POST OPERATIVE OPEN HEART (N/A)  CV: BP controlled. Transitioned to Losartan yesterday per hospitalist. On Lopressor 25mg  BID, Spironolactone 12.5mg  daily and prophylactic Amiodarone 400mg  BID. NSR, HR 70s.   Pulm: Saturating well on RA. CXR with persistent but improving right pleural effusion. Encourage IS and ambulation. Continue diuresis.   GI: Liver cirrhosis and daily alcohol use. On thiamine, folic acid and MVI. Esophageal candidiasis found on EGD preop, continue fluconazole. +BM. Tolerating a diet.   Endo: Preop A1C 5.9, likely prediabetes. No home meds. CBGs controlled on SSI. Will need close outpatient medicine follow up.   Renal: Cr 0.79. On Torsemide and Spironolactone. UO 2450cc/24hrs. +10lb from preop. No change in weight from yesterday? Unsure if correct. Continue Torsemide and Spironolactone. Hyponatremia stable, Na 128. Monitor  ID: MV endocarditis, +strep salivarius on IV PCN-G. PICC placed yesterday. Will continue IV PCN-G x 4 weeks outpatient per ID.   Expected postop ABLA on likely chronic anemia: Hemoglobin 6.6 preop, colonoscopy with large ulcerated polyp removed with a cold snare. H/H 6.7/21.1, trending down. On iron supplement. Will discuss transfusion with Dr. Leafy Ro.   INR: Goal INR 2.5-3.5. INR 2.0 today, received Warfarin 5mg  12/14 but Warfarin was held yesterday. D/C Heparin. Warfarin dosing per pharmacy. Careful dosing since he is on Amiodarone and Diflucan  Dispo: Pt transferred  back to hospitalist service on 12/14. Hospitalist note says the patient prefers SNF but PT recommended home health and overall the plan has been HH with RN and PT, unsure if he will qualify for SNF.  Pt states he is living with a friend and the house is unfit for him to stay with IV ABX because he smokes and has pets. Will work with social work.    LOS: 20 days    Jenny Reichmann, PA-C 07/08/2023

## 2023-07-08 NOTE — Progress Notes (Signed)
PT Cancellation Note  Patient Details Name: Yusuf Pasternak MRN: [id number] DOB: 03-21-1971   Cancelled Treatment:    Reason Eval/Treat Not Completed: Other (comment). Pt recently ambulated and now receiving blood. Feeling fatigued. Helped pt with sit>stand for repositioning. Will follow pt tomorrow to see how blood products change his energy level.   Lyanne Co, PT  Acute Rehab Services Secure chat preferred Office [phone]    Elyse Hsu 07/08/2023, 2:31 PM

## 2023-07-08 NOTE — Progress Notes (Addendum)
Progress Note   Patient: Victor [name] VHQ:[id number] DOB: October 17, 1970 DOA: 06/17/2023     20 DOS: the patient was seen and examined on 07/08/2023   Brief hospital course: Victor [name] was admitted to the hospital with the working diagnosis of heart failure exacerbation.   52 years old male with past medical history of hyperlipidemia, obesity, hypertension presented to hospital with exertional shortness of breath, bilateral lower extremity edema and pain for the last 2 weeks.  Patient also complains of shortness of breath at rest and has been having difficulty lying down in bed. 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  Cardiothoracic surgery was consulted and they feel that his valve will need to be replaced but want him to go under further testing for likely HOCM.  Cardiac catheterization done and he had minimal nonobstructive CAD with elevated V waves and filling pressures as well as a subaortic gradient with increased PVCs suggesting potential hypertrophic cardiomyopathy.   Cardiac MRI moderate asymmetric basal to mid septal hypertrophy. Suspect LV outflow gradient given narrowed LVOT and turbulence in LVOT.  Positive mitral valve cordal SAM or MV SAM Moderate to severe mitral regurgitation.  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   Underwent MVR 12/10 with Dr. 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   Post operative  admitted to the ICU, on mechanical ventilatory support.   12/11: extubated early AM POD 1. Pressors off. Tachypneic with small breaths. +diuretics. Dc swan and a-line   12/14 transferred back to [facility].  12/15 clinically stable, will need outpatient antibiotic therapy, he would like to go to short term rehab.   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  Sp mitral valve replacement, mechanical prosthesis St Jude.   Plan to continue antibiotics with Penicillin to complete 4 weeks post operative.  Patient will need PICC line for outpatient antibiotic therapy.   Post operative right pleural effusion/ atelectasis.  Continue diuresis and incentive spirometer.   Anticoagulation with warfarin and bridge with IV heparin  Post operative amiodarone to prevent arrhythmias.   Acute on chronic diastolic CHF (congestive heart failure) (HCC) Echocardiogram with preserved LV systolic function with EF 70 to 75%, narrow LVOT with mid cavity gradient peak 48 mmHg. Positive mild systolic anterior motion of the mitral valve, mild LVH, RV systolic function preserved, LA with moderate dilatation, moderate mitral valve stenosis, moderate aortic valve stenosis,   TEE with no aortic stenosis, positive SAM of the chordal and subaortic membrane. Severe mitral valve regurgitation with positive vegetation, mild MV stenosis,.   Continue metoprolol for B blocker Diuresis with torsemide 40 mg po daily.  Continue spironolactone and losartan.   HTN (hypertension) Continue blood pressure monitoring.  Continue with metoprolol, and losartan.     Iron deficiency anemia Post operative anemia, acute blood loss.  Hgb has been stable with no current indication for PRBC transfusion.  Continue with oral iron supplementation.   Esophageal candidiasis (HCC) Continue with fluconazole, plan for 14 days of therapy.  Hyponatremia Renal function with serum cr  at 0,79 with K at 4,5 and serum bicarbonate at 24.  Na 128 and Mg 2.0    Plan to continue diuresis with torsemide.  Follow up renal function and electrolytes in am.   Type 2 diabetes mellitus with hyperlipidemia (HCC) Glucose has been stable, will discontinue insulin therapy and check CBG as needed.    Continue with rosuvastatin.         Subjective: Patient is feeling better, no chest pain or dyspnea, edema has improved.   Physical Exam: Vitals:   07/08/23 1152 07/08/23 1307 07/08/23 1322 07/08/23 1555  BP: (!) 100/52 (!) 99/54 (!) 109/59 (!) 112/53  Pulse: 65 63 67 69  Resp: 20 20 17 18   Temp: (!) 97.4 F (36.3 C) 98.4 F (36.9 C) 98.5 F (36.9 C) 98.2 F (36.8 C)  TempSrc: Oral Oral Oral Oral  SpO2:  95% 97% 97%  Weight:      Height:       Neurology awake and alert ENT with mild pallor Cardiovascular with S1 and S2 present and regular, positive systolic murmur at the apex Respiratory with no rales or wheezing with no rhonchi Abdomen with no distention  No lower extremity edema  Data Reviewed:    Family Communication: no family at the bedside   Disposition: Status is: Inpatient Remains inpatient appropriate because: pending discharge home to continue IV antibiotics under home health services   Planned Discharge Destination: Home     Author: Coralie Keens, MD 07/08/2023 4:32 PM  For on call review [email or web address].

## 2023-07-09 DIAGNOSIS — I5033 Acute on chronic diastolic (congestive) heart failure: Secondary | ICD-10-CM | POA: Diagnosis not present

## 2023-07-09 DIAGNOSIS — I10 Essential (primary) hypertension: Secondary | ICD-10-CM | POA: Diagnosis not present

## 2023-07-09 DIAGNOSIS — B3781 Candidal esophagitis: Secondary | ICD-10-CM | POA: Diagnosis not present

## 2023-07-09 DIAGNOSIS — I059 Rheumatic mitral valve disease, unspecified: Secondary | ICD-10-CM | POA: Diagnosis not present

## 2023-07-09 DIAGNOSIS — D509 Iron deficiency anemia, unspecified: Secondary | ICD-10-CM | POA: Diagnosis not present

## 2023-07-09 LAB — BASIC METABOLIC PANEL
Anion gap: 9 (ref 5–15)
BUN: 14 mg/dL (ref 6–20)
CO2: 24 mmol/L (ref 22–32)
Calcium: 8.8 mg/dL — ABNORMAL LOW (ref 8.9–10.3)
Chloride: 94 mmol/L — ABNORMAL LOW (ref 98–111)
Creatinine, Ser: 0.81 mg/dL (ref 0.61–1.24)
GFR, Estimated: >60 mL/min (ref >60)
Glucose, Bld: 120 mg/dL — ABNORMAL HIGH (ref 70–99)
Potassium: 4.7 mmol/L (ref 3.5–5.1)
Sodium: 127 mmol/L — ABNORMAL LOW (ref 135–145)

## 2023-07-09 LAB — CBC
HCT: 22.7 % — ABNORMAL LOW (ref 39.0–52.0)
Hemoglobin: 7.4 g/dL — ABNORMAL LOW (ref 13.0–17.0)
MCH: 29.1 pg (ref 26.0–34.0)
MCHC: 32.6 g/dL (ref 30.0–36.0)
MCV: 89.4 fL (ref 80.0–100.0)
Platelets: 203 10*3/uL (ref 150–400)
RBC: 2.54 MIL/uL — ABNORMAL LOW (ref 4.22–5.81)
RDW: 15.6 % — ABNORMAL HIGH (ref 11.5–15.5)
WBC: 9.6 10*3/uL (ref 4.0–10.5)
nRBC: 0 % (ref 0.0–0.2)

## 2023-07-09 LAB — GLUCOSE, CAPILLARY
Glucose-Capillary: 143 mg/dL — ABNORMAL HIGH (ref 70–99)
Glucose-Capillary: 118 mg/dL — ABNORMAL HIGH (ref 70–99)
Glucose-Capillary: 136 mg/dL — ABNORMAL HIGH (ref 70–99)
Glucose-Capillary: 120 mg/dL — ABNORMAL HIGH (ref 70–99)

## 2023-07-09 LAB — PROTIME-INR
INR: 1.9 — ABNORMAL HIGH (ref 0.8–1.2)
Prothrombin Time: 21.6 s — ABNORMAL HIGH (ref 11.4–15.2)

## 2023-07-09 MED ORDER — WARFARIN SODIUM 5 MG PO TABS
5.0000 mg | ORAL_TABLET | Freq: Once | ORAL | Status: AC
  Administered 2023-07-09: 5 mg via ORAL
  Filled 2023-07-09: qty 1

## 2023-07-09 MED ORDER — GLUCERNA SHAKE PO LIQD
237.0000 mL | Freq: Three times a day (TID) | ORAL | Status: DC
  Administered 2023-07-09 – 2023-07-13 (×7): 237 mL via ORAL

## 2023-07-09 NOTE — Progress Notes (Addendum)
[address]       [place] [address]             [phone]      7 Days Post-Op Procedure(s) (LRB): EXPLORATION POST OPERATIVE OPEN HEART (N/A) Subjective: The patient admits to some sternal soreness. He denies shortness of breath. +BM  Objective: Vital signs in last 24 hours: Temp:  [97.4 F (36.3 C)-98.8 F (37.1 C)] 98.7 F (37.1 C) (12/17 0422) Pulse Rate:  [63-74] 65 (12/17 0422) Cardiac Rhythm: Normal sinus rhythm;Bundle branch block (12/16 1904) Resp:  [16-22] 21 (12/17 0422) BP: (99-134)/(52-80) 113/57 (12/17 0422) SpO2:  [94 %-98 %] 97 % (12/17 0422) Weight:  [91.1 kg] 91.1 kg (12/17 0500)  Hemodynamic parameters for last 24 hours:    Intake/Output from previous day: 12/16 0701 - 12/17 0700 In: 1517.8 [P.O.:240; I.V.:23; Blood:336; IV Piggyback:918.8] Out: 2375 [Urine:2375] Intake/Output this shift: No intake/output data recorded.  General appearance: alert, cooperative, and no distress Neurologic: intact Heart: regular rate and rhythm, S1, S2 normal, no murmur, click, rub or gallop Lungs: slightly diminished bibasilar breath sounds Abdomen: soft, non-tender; bowel sounds normal; no masses,  no organomegaly Extremities: edema 1+ Wound: Clean and dry without sign of infection  Lab Results: Recent Labs    07/08/23 0320 07/08/23 2030 07/09/23 0430  WBC 8.6  --  9.6  HGB 6.7* 7.7* 7.4*  HCT 21.1* 23.7* 22.7*  PLT 176  --  203   BMET:  Recent Labs    07/08/23 0320 07/09/23 0430  NA 128* 127*  K 4.5 4.7  CL 96* 94*  CO2 24 24  GLUCOSE 117* 120*  BUN 15 14  CREATININE 0.79 0.81  CALCIUM 8.5* 8.8*    PT/INR:  Recent Labs    07/09/23 0430  LABPROT 21.6*  INR 1.9*   ABG    Component Value Date/Time   PHART 7.401 07/03/2023 0800   HCO3 20.1 07/03/2023 0800   TCO2 21 (L) 07/03/2023 0800   ACIDBASEDEF 4.0 (H) 07/03/2023 0800   O2SAT 99 07/03/2023 0800   CBG (last 3)  Recent Labs    07/08/23 1649  07/08/23 2131 07/09/23 0619  GLUCAP 115* 120* 143*    Assessment/Plan: S/P Procedure(s) (LRB): EXPLORATION POST OPERATIVE OPEN HEART (N/A)  CV: BP controlled, soft at times. Transitioned to Losartan 25mg  daily per hospitalist. On Lopressor 25mg  BID, Spironolactone 12.5mg  daily and prophylactic Amiodarone 400mg  BID x 5d. No sign of atrial fibrillation and prolonged QT interval 502 this AM. Will d/c prophylactic Amiodarone. NSR, HR 60-70s.    Pulm: Saturating well on RA. CXR with persistent but improving right pleural effusion. Encourage IS and ambulation. Continue diuresis.    GI: Liver cirrhosis and daily alcohol use. On thiamine, folic acid and MVI. Esophageal candidiasis found on EGD preop, continue fluconazole x 14d. +BM. Tolerating a diet.    Endo: Preop A1C 5.9, likely prediabetes. No home meds. CBGs controlled on SSI. Will need close outpatient medicine follow up. Will d/c SSI and CBGs.   Renal: Cr 0.81. On Torsemide and Spironolactone. UO 2375cc/24hrs. +9lb from preop. Continue Torsemide and Spironolactone. Hyponatremia trending down likely due to diuresis, Na 127. Monitor   ID: MV endocarditis, +strep salivarius on IV PCN-G. PICC placed yesterday. Will continue IV PCN-G x 4 weeks outpatient per ID.    Expected postop ABLA on likely chronic anemia: Hemoglobin 6.6 preop, colonoscopy with large ulcerated polyp removed with a cold snare. H/H 7.4/22.7 today after 1U PRBCs  yesterday. On iron supplement. Monitor closely.    INR: Goal INR 2.5-3.5. INR 1.9 today after Coumadin 2.5mg  yesterday. Heparin has been d/c'd. Warfarin dosing per pharmacy, may need 5mg  today. Careful dosing since he is on Amiodarone and Diflucan   Dispo: Pt transferred back to hospitalist service on 12/14. Pt needs to get INR closer to goal before d/c. Looks like social work is working on placement...skilled nursing facility vs home with son.   LOS: 21 days    Jenny Reichmann, PA-C 07/09/2023 Agree with  above

## 2023-07-09 NOTE — Progress Notes (Signed)
Came to offer assistance with ambulation however pt sleeping.   Pt was seen earlier, we discussed rehab and sternal precautions.   Faustino Congress 07/09/2023 2:20 PM

## 2023-07-09 NOTE — Progress Notes (Signed)
Progress Note   Patient: Javontae Dickes ZOX:[id number] DOB: 10-Jan-1971 DOA: 06/17/2023     21 DOS: the patient was seen and examined on 07/09/2023   Brief hospital course: Mr. Vandriel was admitted to the hospital with the working diagnosis of heart failure exacerbation.   52 years old male with past medical history of hyperlipidemia, obesity, hypertension presented to hospital with exertional shortness of breath, bilateral lower extremity edema and pain for the last 2 weeks.  Patient also complains of shortness of breath at rest and has been having difficulty lying down in bed. 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  Cardiothoracic surgery was consulted and they feel that his valve will need to be replaced but want him to go under further testing for likely HOCM.  Cardiac catheterization done and he had minimal nonobstructive CAD with elevated V waves and filling pressures as well as a subaortic gradient with increased PVCs suggesting potential hypertrophic cardiomyopathy.   Cardiac MRI moderate asymmetric basal to mid septal hypertrophy. Suspect LV outflow gradient given narrowed LVOT and turbulence in LVOT.  Positive mitral valve cordal SAM or MV SAM Moderate to severe mitral regurgitation.  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   Underwent MVR 12/10 with Dr. 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   Post operative  admitted to the ICU, on mechanical ventilatory support.   12/11: extubated early AM POD 1. Pressors off. Tachypneic with small breaths. +diuretics. Dc swan and a-line   12/14 transferred back to [facility].  12/15 clinically stable, will need outpatient antibiotic therapy, he would like to go to short term rehab.  12/16 pending INR to be therapeutic in order to discharge home, he has no SNF insurance coverage.   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  Sp mitral valve replacement, mechanical prosthesis St Jude.   Plan to continue antibiotics with Penicillin to complete 4 weeks post operative.  Patient will need PICC line for outpatient antibiotic therapy.   Post operative right pleural effusion/ atelectasis.  Continue diuresis and incentive spirometer.   Anticoagulation with warfarin and bridge with IV heparin  Today INR is 1.9  Post operative amiodarone to prevent arrhythmias.   Acute on chronic diastolic CHF (congestive heart failure) (HCC) Echocardiogram with preserved LV systolic function with EF 70 to 75%, narrow LVOT with mid cavity gradient peak 48 mmHg. Positive mild systolic anterior motion of the mitral valve, mild LVH, RV systolic function preserved, LA with moderate dilatation, moderate mitral valve stenosis, moderate aortic valve stenosis,   TEE with no aortic stenosis, positive SAM of the chordal and subaortic membrane. Severe mitral valve regurgitation with positive vegetation, mild MV stenosis,.   Continue metoprolol for B blocker Continue spironolactone and losartan.  Improved volume status, will hold on torsemide for now.   HTN (hypertension) Continue blood pressure monitoring.  Continue with metoprolol, and losartan.     Iron deficiency anemia Post operative anemia, acute blood loss.   Hgb is 7.4 today, getting PRBC x1  transfusion per surgical team.  Follow up hgb in am.  Continue with oral iron  supplementation.   Esophageal candidiasis (HCC) Continue with fluconazole, plan for 14 days of therapy.   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  Will hold on diuretic therapy and unrestrict diet.  Add protein supplements and follow up renal function and electrolytes in am.   Type 2 diabetes mellitus with hyperlipidemia (HCC) Glucose has been stable, will discontinue insulin therapy and check CBG as needed.   Fasting glucose today is 120 mg/dl   Continue with rosuvastatin.         Subjective: Patient with improvement in dyspnea and edema, no chest pain, he has bee getting out of bed to chair and has been working with physical and occupational therapy  Physical Exam: Vitals:   07/09/23 0500 07/09/23 0756 07/09/23 0806 07/09/23 1148  BP:   134/61 (!) 113/57  Pulse:   66 63  Resp:  16 20 20   Temp:   98.4 F (36.9 C) (!) 97.3 F (36.3 C)  TempSrc:   Oral Oral  SpO2:   95%   Weight: 91.1 kg     Height:        Neurology awake and alert ENT with mild pallor Cardiovascular with S1 and S2 present and regular, no gallops or rubs, positive systolic murmur at the apex No JVD No lower extremity edema Respiratory with no rales or wheezing, no rhonchi Abdomen with no distention  Data Reviewed:    Family Communication: no family at the bedside   Disposition: Status is: Inpatient Remains inpatient appropriate because: pending therapeutic INR prior to discharge home with home antibiotic therapy   Planned Discharge Destination: Home      Author: Coralie Keens, MD 07/09/2023 3:02 PM  For on call review [email or web address].

## 2023-07-09 NOTE — Progress Notes (Signed)
ANTICOAGULATION CONSULT NOTE  Pharmacy Consult for heparin no bolus + warfarin > warfarin only Indication: s/p mechanical MVR 12/10  Allergies  Allergen Reactions   Iodinated Contrast Media Itching   Gadolinium Derivatives     Patient Measurements: Height: 5\' 7"  (170.2 cm) Weight: 91.1 kg (200 lb 13.4 oz) IBW/kg (Calculated) : 66.1 Heparin Dosing Weight: 83 kg  Vital Signs: Temp: 98.4 F (36.9 C) (12/17 0806) Temp Source: Oral (12/17 0806) BP: 134/61 (12/17 0806) Pulse Rate: 66 (12/17 0806)  Labs: Recent Labs    07/06/23 0951 07/07/23 0335 07/07/23 0335 07/08/23 0320 07/08/23 2030 07/09/23 0430  HGB  --  7.0*   < > 6.7* 7.7* 7.4*  HCT  --  21.6*   < > 21.1* 23.7* 22.7*  PLT  --  163  --  176  --  203  LABPROT  --  20.8*  --  23.3*  --  21.6*  INR  --  1.8*  --  2.0*  --  1.9*  HEPARINUNFRC 0.42 0.37  --  0.51  --   --   CREATININE  --  0.73  --  0.79  --  0.81   < > = values in this interval not displayed.    Estimated Creatinine Clearance: 112.2 mL/min (by C-G formula based on SCr of 0.81 mg/dL).  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 Not on anticoagulation prior to admission.  Pharmacy consulted for heparin and warfarin dosing starting POD#2 (12/12).  Of note, currently on fluconazole for esophageal candidiasis (to stop 12/20). Postop amio ppx x5 days was completed 12/16. Both medications reduce warfarin metabolism / enhance anticoagulant effect.  INR is 1.9 today after warfarin 2.5 mg yesterday. Remains on fluconazole, amio course completed. No meal documentation. Will give a 5 mg dose this evening to push INR towards goal of 2.5. CBC is stable, s/p 1 unit PRBC on 12/16. Patient not currently on heparin bridge with subtherapeutic  INR, MD is aware.  Goal of Therapy:  INR 2.5-3.5 Monitor platelets by anticoagulation protocol: Yes   Plan:  Warfarin 5 mg x1 tonight Monitor daily INR and CBC Monitor food intake and drug interactions   Ernestene Kiel, PharmD PGY1 Pharmacy Resident  Please check AMION for all [facility] Pharmacy phone numbers After 10:00 PM, call Main Pharmacy [phone] 07/09/2023 9:45 AM

## 2023-07-09 NOTE — TOC Progression Note (Signed)
Transition of Care [facility]) - Progression Note    Patient Details  Name: Victor [name] MRN: [id number] Date of Birth: March 08, 1971  Transition of [facility]) CM/SW Contact  Eduard Roux, [place] Phone Number: 07/09/2023, 10:52 AM  Clinical Narrative:     CSW met with patient to inform no SNF bed offers, insurance does not cover skilled benefits. CSW advised to explore other d/c options, patient states understanding.   CM updated  TOC will continue to follow and assist with discharge planning.  Antony Blackbird, MSW, LCSW Clinical Social Worker     Expected Discharge Plan: Skilled Nursing Facility Barriers to Discharge: English as a second language teacher, SNF Pending bed offer  Expected Discharge Plan and Services In-house Referral: Clinical Social Work Discharge Planning Services: CM Consult Post Acute Care Choice: Home Health, Durable Medical Equipment Living arrangements for the past 2 months: Single Family Home                 DME Arranged: Walker rolling         HH Arranged: RN, IV Antibiotics HH Agency: Other - See comment ([facility]) Date HH Agency Contacted: 07/05/23 Time HH Agency Contacted: 1413 Representative spoke with at [facility] Agency: Santina Evans   Social Determinants of Health (SDOH) Interventions SDOH Screenings   Food Insecurity: Food Insecurity Present (06/18/2023)  Housing: Medium Risk (06/18/2023)  Transportation Needs: Unmet Transportation Needs (06/18/2023)  Utilities: Not At Risk (06/18/2023)  Depression (PHQ2-9): Medium Risk (05/07/2023)  Tobacco Use: High Risk (07/02/2023)    Readmission Risk Interventions     No data to display

## 2023-07-09 NOTE — Progress Notes (Signed)
Physical Therapy Treatment Patient Details Name: Victor [name] MRN: [id number] DOB: 17-Oct-1970 Today's Date: 07/09/2023   History of Present Illness 52 year old male who was admitted on 11/26 with SOB and LE edema, pt now s/p MVR on 12/10. PMH: hyperlipidemia, obesity, hypertension, and chronic back pain    PT Comments  Pt continue to progress with mobility. Rising from a low surface continues to hard for him, needs very light min A for fwd wt shift but a surface even 3" higher results in him needing no physical assist. Discussed this in light of seating choices. Began gait training in room without AD and pt was steadier than he thought he would be. Used rollator for ambulation in hallway and practiced turning and sitting without breaking sternal precautions. This device would be beneficial for him for community ambulation. Pt practiced 3 steps with R rail and supervision. VSS throughout. Recommend home to son's house with outpt cardiac rehab. PT will continue to follow.     If plan is discharge home, recommend the following: A little help with bathing/dressing/bathroom;Help with stairs or ramp for entrance;Assist for transportation;Assistance with cooking/housework;A little help with walking and/or transfers   Can travel by private vehicle        Equipment Recommendations  Rollator (4 wheels)    Recommendations for Other Services       Precautions / Restrictions Precautions Precautions: Sternal;Fall Precaution Booklet Issued: No Precaution Comments: reviewed sternal precautions with mobility, specifically with rollator Restrictions Weight Bearing Restrictions Per Provider Order: Yes (sternal precautions) RUE Weight Bearing Per Provider Order: Non weight bearing LUE Weight Bearing Per Provider Order: Non weight bearing Other Position/Activity Restrictions: sternal precautions     Mobility  Bed Mobility               General bed mobility comments: received in chair     Transfers Overall transfer level: Needs assistance Equipment used: Rollator (4 wheels), None Transfers: Sit to/from Stand Sit to Stand: Min assist, Supervision           General transfer comment: from low recliner pt continues to need very light min A to help him maintain fwd wt shift during gait to come all the way up. With a surface even 3" taller (rollator) pt able to stand with supervision, no difficulty. Discussed choices of seating at d/c    Ambulation/Gait Ambulation/Gait assistance: Supervision Gait Distance (Feet): 250 Feet Assistive device: Rollator (4 wheels), None Gait Pattern/deviations: Step-through pattern, Decreased stride length Gait velocity: slowed Gait velocity interpretation: 1.31 - 2.62 ft/sec, indicative of limited community ambulator (worked on Engineer, manufacturing systems with rollator)   General Gait Details: ambulated in room with no AD. Pt very hesitant at first but then found that he was steadier than he expected. Rollator used in hallway and pt liked being able to sit when needed (did so after stairs). Would be good device for him for home for community distances.   Stairs Stairs: Yes Stairs assistance: Supervision Stair Management: Forwards, Alternating pattern, One rail Right Number of Stairs: 3 General stair comments: pt safe with use of rail, no pain with steps.  Pt took seated rest break after steps   Wheelchair Mobility     Tilt Bed    Modified Rankin (Stroke Patients Only)       Balance Overall balance assessment: Mild deficits observed, not formally tested Sitting-balance support: Feet supported Sitting balance-Leahy Scale: Good     Standing balance support: No upper extremity supported, During functional activity, Bilateral upper  extremity supported Standing balance-Leahy Scale: Fair                              Cognition Arousal: Alert Behavior During Therapy: WFL for tasks assessed/performed Overall Cognitive Status:  Within Functional Limits for tasks assessed                                          Exercises      General Comments General comments (skin integrity, edema, etc.): VSS on RA, 2/4 DOE with stairs.      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, get back to work painting PT Goal Formulation: With patient Time For Goal Achievement: 07/17/23 Potential to Achieve Goals: Good Progress towards PT goals: Progressing toward goals    Frequency    Min 1X/week      PT Plan      Co-evaluation              AM-PAC PT "6 Clicks" Mobility   Outcome Measure  Help needed turning from your back to your side while in a flat bed without using bedrails?: A Little Help needed moving from lying on your back to sitting on the side of a flat bed without using bedrails?: A Little Help needed moving to and from a bed to a chair (including a wheelchair)?: A Little Help needed standing up from a chair using your arms (e.g., wheelchair or bedside chair)?: A Little Help needed to walk in hospital room?: None Help needed climbing 3-5 steps with a railing? : None 6 Click Score: 20    End of Session   Activity Tolerance: Patient tolerated treatment well Patient left: with call bell/phone within reach;in chair Nurse Communication: Mobility status PT Visit Diagnosis: Unsteadiness on feet (R26.81);Difficulty in walking, not elsewhere classified (R26.2);Muscle weakness (generalized) (M62.81);Other abnormalities of gait and mobility (R26.89)      Time: 1610-9604 PT Time Calculation (min) (ACUTE ONLY): 22 min  Charges:    $Gait Training: 8-22 mins PT General Charges $$ ACUTE PT VISIT: 1 Visit                     Lyanne Co, PT  Acute Rehab Services Secure chat preferred Office [phone]    Victor [name] 07/09/2023, 11:23 AM

## 2023-07-09 NOTE — Progress Notes (Signed)
Occupational Therapy Treatment Patient Details Name: Victor [name] MRN: [id number] DOB: 01-20-1971 Today's Date: 07/09/2023   History of present illness 52 year old male who was admitted on 11/26 with SOB and LE edema, pt now s/p MVR on 12/10. PMH: hyperlipidemia, obesity, hypertension, and chronic back pain   OT comments  Pt progressing well toward established OT goals. Focus session on continued education and implementation of ADL within sternal precautions. Good return demo of UB ADL, LB ADL, and posterior pericare within precautions after initial education. Functional mobility into hall with good self pacing and tolerance. Pt most limited by technique and power with STS transfers this session, requiring min A progressing to min tactile cues with repetition. Continued to recommend dc home with son and family to assist with needed; follow up with cardiac rehab.       If plan is discharge home, recommend the following:  Assist for transportation;A little help with walking and/or transfers;A little help with bathing/dressing/bathroom;Assistance with cooking/housework;Help with stairs or ramp for entrance   Equipment Recommendations  None recommended by OT    Recommendations for Other Services      Precautions / Restrictions Precautions Precautions: Sternal;Fall Precaution Booklet Issued: Yes (comment) Precaution Comments: reviewed sternal precautions within the context of ADL Restrictions Weight Bearing Restrictions Per Provider Order: Yes (sternal precautions) Other Position/Activity Restrictions: sternal precautions       Mobility Bed Mobility Overal bed mobility: Needs Assistance Bed Mobility: Rolling, Sidelying to Sit Rolling: Contact guard assist Sidelying to sit: Contact guard assist       General bed mobility comments: for safety.    Transfers Overall transfer level: Needs assistance Equipment used: Rolling walker (2 wheels) Transfers: Sit to/from Stand Sit to  Stand: Min assist           General transfer comment: min A for anterior weight shift practicing 5x this session up from EOB and chair; Needing at least min tactile cueing all attempts     Balance Overall balance assessment: Mild deficits observed, not formally tested Sitting-balance support: Feet supported Sitting balance-Leahy Scale: Good     Standing balance support: No upper extremity supported, During functional activity, Bilateral upper extremity supported Standing balance-Leahy Scale: Fair                             ADL either performed or assessed with clinical judgement   ADL Overall ADL's : Needs assistance/impaired     Grooming: Oral care;Supervision/safety;Standing           Upper Body Dressing : Minimal assistance;Sitting Upper Body Dressing Details (indicate cue type and reason): within sternal precautions Lower Body Dressing: Supervision/safety;Sitting/lateral leans Lower Body Dressing Details (indicate cue type and reason): donning/doffing socks Toilet Transfer: Rolling walker (2 wheels);Ambulation;Contact guard assist           Functional mobility during ADLs: Contact guard assist;Rolling walker (2 wheels) General ADL Comments: into hallway    Extremity/Trunk Assessment Upper Extremity Assessment Upper Extremity Assessment: Generalized weakness (s/p surgery)   Lower Extremity Assessment Lower Extremity Assessment: Defer to PT evaluation        Vision   Vision Assessment?: No apparent visual deficits   Perception     Praxis      Cognition Arousal: Alert Behavior During Therapy: WFL for tasks assessed/performed Overall Cognitive Status: Within Functional Limits for tasks assessed  General Comments: Pt able to recall sternal precautions. Following commands [facility]        Exercises      Shoulder Instructions       General Comments VSS on RA HR 70s-80s    Pertinent Vitals/  Pain       Pain Assessment Pain Assessment: Faces Faces Pain Scale: Hurts little more Pain Location: generalized with mobility at sternal incision Pain Descriptors / Indicators: Discomfort, Operative site guarding Pain Intervention(s): Limited activity within patient's tolerance, Monitored during session  Home Living                                          Prior Functioning/Environment              Frequency  Min 1X/week        Progress Toward Goals  OT Goals(current goals can now be found in the care plan section)  Progress towards OT goals: Progressing toward goals  Acute Rehab OT Goals 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 OT Goal Formulation: With patient Time For Goal Achievement: 07/18/23 Potential to Achieve Goals: Good ADL Goals Pt Will Perform Grooming: with modified independence;standing Pt Will Perform Upper Body Dressing: with modified independence;sitting Pt Will Perform Lower Body Dressing: with modified independence;sit to/from stand Pt Will Transfer to Toilet: with modified independence;regular height toilet;ambulating Pt Will Perform Toileting - Clothing Manipulation and hygiene: with modified independence;sit to/from stand Additional ADL Goal #1: Pt will perform all ADL with mod I for incresaed time and rest breaks as needed within sternal precautions  Plan      Co-evaluation                 AM-PAC OT "6 Clicks" Daily Activity     Outcome Measure   Help from another person eating meals?: None Help from another person taking care of personal grooming?: A Little Help from another person toileting, which includes using toliet, bedpan, or urinal?: A Little Help from another person bathing (including washing, rinsing, drying)?: A Little Help from another person to put on and taking off regular upper body clothing?: A Little Help from another person to put on and taking off regular lower body clothing?: A Little 6 Click Score:  19    End of Session Equipment Utilized During Treatment: Gait belt;Rolling walker (2 wheels)  OT Visit Diagnosis: Unsteadiness on feet (R26.81)   Activity Tolerance Patient tolerated treatment well   Patient Left in chair;with call bell/phone within reach   Nurse Communication Mobility status        Time: 5409-8119 OT Time Calculation (min): 37 min  Charges: OT General Charges $OT Visit: 1 Visit OT Treatments $Self Care/Home Management : 23-37 mins  Tyler Deis, OTR/L [facility] Acute Rehabilitation Office: [phone]   Myrla Halsted 07/09/2023, 10:40 AM

## 2023-07-09 NOTE — TOC Progression Note (Signed)
Transition of Care (TOC) - Progression Note  Donn Pierini RN, BSN Transitions of Care Unit 4E- RN Case Manager See Treatment Team for direct phone #   Patient Details  Name: Victor [name] MRN: [id number] Date of Birth: 08-Apr-1971  Transition of Care [facility]) CM/SW Contact  Zenda Alpers, Lenn Sink, RN Phone Number: 07/09/2023, 2:37 PM  Clinical Narrative:    Noted per CSW that pt does not have SNF benefits- plan will be to go to son's home in [facility].  CM spoke with home infusion liaison- education was done by their nurse yesterday and pt did well. They will continue to follow for home infusion needs.   CM spoke with pt at bedside- confirmed plan to go to son-[name]'s home- address is: [address], [place] [address] [name]-phone #- [phone]  CM has set up HHPT w/ [facility] and [facility] will provide [facility] for home infusion and PICC line care.   Pt voiced he would prefer rollator instead of RW - MD to modify order- CM will arrange closer to d/c .   TOC to continue to follow  Expected Discharge Plan: Skilled Nursing Facility Barriers to Discharge: Continued Medical Work up  Expected Discharge Plan and Services In-house Referral: Clinical Social Work Discharge Planning Services: CM Consult Post Acute Care Choice: Home Health, Durable Medical Equipment Living arrangements for the past 2 months: Single Family Home                 DME Arranged: Walker rolling with seat         HH Arranged: RN, IV Antibiotics HH Agency: [facility], Other - See comment ([facility]) Date [facility] Agency Contacted: 07/08/23 Time HH Agency Contacted: 1413 Representative spoke with at [facility] Agency: Kandee Keen and Santina Evans   Social Determinants of Health (SDOH) Interventions SDOH Screenings   Food Insecurity: Food Insecurity Present (06/18/2023)  Housing: Medium Risk (06/18/2023)  Transportation Needs: Unmet Transportation Needs (06/18/2023)  Utilities: Not At Risk (06/18/2023)  Depression  (PHQ2-9): Medium Risk (05/07/2023)  Tobacco Use: High Risk (07/02/2023)    Readmission Risk Interventions     No data to display

## 2023-07-09 NOTE — Progress Notes (Signed)
Mobility Specialist Progress Note:   07/09/23 1623  Mobility  Activity Ambulated with assistance to bathroom  Level of Assistance Contact guard assist, steadying assist  Assistive Device Front wheel walker  Distance Ambulated (ft) 15 ft  RUE Weight Bearing Per Provider Order NWB  LUE Weight Bearing Per Provider Order NWB  Activity Response Tolerated well  Mobility Referral Yes  Mobility visit 1 Mobility  Mobility Specialist Start Time (ACUTE ONLY) 1540  Mobility Specialist Stop Time (ACUTE ONLY) 1545  Mobility Specialist Time Calculation (min) (ACUTE ONLY) 5 min   Pt received in chair, requesting assistance to BR. Pt was able to stand independently within precautions. Ambulated to BR without fault. Pt left in BR and reminded to pull call bell when ready. NT notified.  Leory Plowman  Mobility Specialist Please contact via [employer] office at [phone]

## 2023-07-09 NOTE — Progress Notes (Signed)
PROGRESS NOTE    Victor [name]  XBM:[id number] DOB: 12-01-1970 DOA: 06/17/2023 PCP: Claiborne Rigg, NP  52/M w hyperlipidemia, obesity, hypertension presented to hospital with exertional shortness of breath, bilateral lower extremity edema and pain for the last 2 weeks.  Further workup he was found to have a a Strepotcoccal Bacteremmia and Mitral Valve Endocarditis  Repeat blood cultures currently showing no growth to date and TEE was done showing subaortic membrane with dynamic outflow tract obstruction due to chordal SAM up to 40 mmHg and mitral valve vegetations with degenerative calcified leaflets and mild stenosis and severe mitral regurgitation. -Cardiothoracic surgery was consulted and they feel that his valve will need to be replaced but want him to go under further testing for likely HOCM.  Cardiac cath>minimal nonobstructive CAD with elevated V waves and filling pressures as well as a subaortic gradient with increased PVCs suggesting potential hypertrophic cardiomyopathy.   Cardiac MRI moderate asymmetric basal to mid septal hypertrophy. Suspect LV outflow gradient given narrowed LVOT and turbulence in LVOT.  Positive mitral valve cordal SAM or MV SAM Moderate to severe mitral regurgitation.  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    Underwent MVR 12/10 with Dr. 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    Post operative admitted to the ICU, on mechanical ventilatory support.    12/11: extubated early AM POD 1. Pressors off. Tachypneic with small breaths. +diuretics. Dc swan and a-line    12/14 transferred back to [facility].  12/15 clinically stable, will need outpatient antibiotic therapy, he would like to go to short term rehab.  12/16 pending INR to be therapeutic in  order to discharge home, he has no SNF insurance coverage.      Subjective:   Assessment and Plan:   Severe mitral valve endocarditis Streptococcal bacteremia.  Sp mitral valve replacement, mechanical prosthesis St Jude.  -continue antibiotics with Penicillin to complete 4 weeks post operative.  Patient will need PICC line for outpatient antibiotic therapy.  Post operative right pleural effusion/ atelectasis.  Continue diuresis and incentive spirometer.  Anticoagulation with warfarin and bridge with IV heparin  Today INR is 1.9  Post operative amiodarone to prevent arrhythmias.   Acute on chronic diastolic CHF (congestive heart failure) (HCC) Echocardiogram with preserved LV systolic function with EF 70 to 75%, narrow LVOT with mid cavity gradient peak 48 mmHg. Positive mild systolic anterior motion of the mitral valve, mild LVH, RV systolic function preserved, LA with moderate dilatation, moderate mitral valve stenosis, moderate aortic valve stenosis,  TEE with no aortic stenosis, positive SAM of the chordal and subaortic membrane. Severe mitral valve regurgitation with positive vegetation, mild MV stenosis,.  Continue metoprolol for B blocker Continue spironolactone and losartan.  Improved volume status, will hold on torsemide for now.   HTN (hypertension) Continue blood pressure monitoring.  Continue with metoprolol, and losartan.     Iron deficiency anemia Post operative anemia, acute blood loss.  Hgb is 7.4 today, getting PRBC x1 transfusion per surgical team.  Follow up hgb in am.  Continue with oral iron supplementation.   Esophageal candidiasis (HCC) Continue with fluconazole, plan for 14 days of therapy.   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  Will hold on diuretic  therapy and unrestrict diet.  Add protein supplements and follow up renal function and  electrolytes in am.   Type 2 diabetes mellitus  Glucose has been stable, will discontinue insulin therapy and check CBG as needed.    DVT prophylaxis: Warfarin Code Status: Full Code Family Communication: Disposition Plan:   Consultants:    Procedures:   Antimicrobials:    Objective: Vitals:   07/09/23 1148 07/09/23 1520 07/09/23 1644 07/09/23 2023  BP: (!) 113/57  139/66 (!) 124/49  Pulse: 63   67  Resp: 20 18 17 20   Temp: (!) 97.3 F (36.3 C)  98.8 F (37.1 C) 99.5 F (37.5 C)  TempSrc: Oral  Oral Oral  SpO2:    96%  Weight:      Height:        Intake/Output Summary (Last 24 hours) at 07/09/2023 2052 Last data filed at 07/09/2023 2034 Gross per 24 hour  Intake 1262.41 ml  Output 2025 ml  Net -762.59 ml   Filed Weights   07/07/23 0226 07/08/23 0342 07/09/23 0500  Weight: 91.3 kg 91.2 kg 91.1 kg    Examination:      Data Reviewed:   CBC: Recent Labs  Lab 07/05/23 0454 07/06/23 0245 07/07/23 0335 07/08/23 0320 07/08/23 2030 07/09/23 0430  WBC 12.3* 11.9* 8.8 8.6  --  9.6  HGB 6.9* 7.6* 7.0* 6.7* 7.7* 7.4*  HCT 21.2* 23.7* 21.6* 21.1* 23.7* 22.7*  MCV 92.2 91.9 91.1 91.7  --  89.4  PLT 107* 133* 163 176  --  203   Basic Metabolic Panel: Recent Labs  Lab 07/03/23 0412 07/03/23 0800 07/03/23 1603 07/04/23 0518 07/04/23 1828 07/05/23 0454 07/06/23 0245 07/07/23 0335 07/08/23 0320 07/09/23 0430  NA 130*   < > 129* 129*   < > 128* 127* 129* 128* 127*  K 4.6   < > 4.4 4.5   < > 4.4 4.6 4.1 4.5 4.7  CL 103  --  98 98   < > 95* 93* 93* 96* 94*  CO2 20*  --  20* 23   < > 23 21* 22 24 24   GLUCOSE 155*  --  184* 141*   < > 140* 125* 119* 117* 120*  BUN 9  --  12 12   < > 12 17 15 15 14   CREATININE 0.65  --  0.81 0.73   < > 0.62 0.70 0.73 0.79 0.81  CALCIUM 8.3*  --  8.8* 8.5*   < > 8.5* 8.6* 8.5* 8.5* 8.8*  MG 2.4  --  2.2 2.2  --  2.1  --   --  2.0  --    < > = values in this interval not displayed.   GFR: Estimated Creatinine  Clearance: 112.2 mL/min (by C-G formula based on SCr of 0.81 mg/dL). Liver Function Tests: Recent Labs  Lab 07/05/23 0454  AST 29  ALT 17  ALKPHOS 47  BILITOT 1.0  PROT 6.0*  ALBUMIN 2.5*   No results for input(s): "LIPASE", "AMYLASE" in the last 168 hours. No results for input(s): "AMMONIA" in the last 168 hours. Coagulation Profile: Recent Labs  Lab 07/05/23 0454 07/06/23 0245 07/07/23 0335 07/08/23 0320 07/09/23 0430  INR 1.2 1.3* 1.8* 2.0* 1.9*   Cardiac Enzymes: No results for input(s): "CKTOTAL", "CKMB", "CKMBINDEX", "TROPONINI" in the last 168 hours. BNP (last 3 results) No results for input(s): "PROBNP" in the last 8760 hours. HbA1C: No results for input(s): "HGBA1C" in the last 72 hours. CBG:  Recent Labs  Lab 07/08/23 1649 07/08/23 2131 07/09/23 0619 07/09/23 1150 07/09/23 1643  GLUCAP 115* 120* 143* 118* 136*   Lipid Profile: No results for input(s): "CHOL", "HDL", "LDLCALC", "TRIG", "CHOLHDL", "LDLDIRECT" in the last 72 hours. Thyroid Function Tests: No results for input(s): "TSH", "T4TOTAL", "FREET4", "T3FREE", "THYROIDAB" in the last 72 hours. Anemia Panel: No results for input(s): "VITAMINB12", "FOLATE", "FERRITIN", "TIBC", "IRON", "RETICCTPCT" in the last 72 hours. Urine analysis:    Component Value Date/Time   COLORURINE YELLOW 06/19/2023 0045   APPEARANCEUR CLEAR 06/19/2023 0045   LABSPEC 1.011 06/19/2023 0045   PHURINE 7.0 06/19/2023 0045   GLUCOSEU NEGATIVE 06/19/2023 0045   HGBUR NEGATIVE 06/19/2023 0045   BILIRUBINUR NEGATIVE 06/19/2023 0045   KETONESUR NEGATIVE 06/19/2023 0045   PROTEINUR NEGATIVE 06/19/2023 0045   UROBILINOGEN 1.0 10/15/2013 1349   NITRITE NEGATIVE 06/19/2023 0045   LEUKOCYTESUR NEGATIVE 06/19/2023 0045   Sepsis Labs: @LABRCNTIP (procalcitonin:4,lacticidven:4)  ) Recent Results (from the past 240 hours)  Surgical pcr screen     Status: None   Collection Time: 07/01/23  3:07 PM   Specimen: Nasal Mucosa; Nasal  Swab  Result Value Ref Range Status   MRSA, PCR NEGATIVE NEGATIVE Final   Staphylococcus aureus NEGATIVE NEGATIVE Final    Comment: (NOTE) The Xpert SA Assay (FDA approved for NASAL specimens in patients 54 years of age and older), is one component of a comprehensive surveillance program. It is not intended to diagnose infection nor to guide or monitor treatment. Performed at [facility], [address] [place], [place] [address]   Aerobic/Anaerobic Culture w Gram Stain (surgical/deep wound)     Status: None   Collection Time: 07/02/23  9:15 AM   Specimen: Mitral Valve Leaflets; Tissue  Result Value Ref Range Status   Specimen Description TISSUE  Final   Special Requests mitral valve  Final   Gram Stain NO WBC SEEN NO ORGANISMS SEEN   Final   Culture   Final    No growth aerobically or anaerobically. Performed at [facility], [address] [place], [place] [address]    Report Status 07/07/2023 FINAL  Final     Radiology Studies: No results found.   Scheduled Meds:  Chlorhexidine Gluconate Cloth  6 each Topical Daily   docusate sodium  200 mg Oral Daily   feeding supplement (GLUCERNA SHAKE)  237 mL Oral TID BM   ferrous gluconate  324 mg Oral Q breakfast   fluconazole  200 mg Oral Daily   gabapentin  300 mg Oral TID   losartan  25 mg Oral Daily   metoprolol tartrate  25 mg Oral BID   nicotine  14 mg Transdermal Daily   pantoprazole  40 mg Oral Daily   rosuvastatin  20 mg Oral Daily   sodium chloride flush  10-40 mL Intracatheter Q12H   sodium chloride flush  3 mL Intravenous Q12H   spironolactone  12.5 mg Oral Daily   traZODone  100 mg Oral QHS   Warfarin - Pharmacist Dosing Inpatient   Does not apply q1600   Continuous Infusions:  penicillin G potassium 12 Million Units in dextrose 5 % 500 mL CONTINUOUS infusion 41.7 mL/hr at 07/09/23 2034     LOS: 21 days    Time spent:    Zannie Cove, MD [facility]   07/09/2023, 8:52  PM

## 2023-07-09 NOTE — Progress Notes (Signed)
Mobility Specialist Progress Note:   07/09/23 1627  Mobility  Activity Ambulated with assistance in hallway  Level of Assistance Minimal assist, patient does 75% or more  Assistive Device Front wheel walker  Distance Ambulated (ft) 175 ft  RUE Weight Bearing Per Provider Order NWB  LUE Weight Bearing Per Provider Order NWB  Activity Response Tolerated well  Mobility Referral Yes  Mobility visit 1 Mobility  Mobility Specialist Start Time (ACUTE ONLY) 1605  Mobility Specialist Stop Time (ACUTE ONLY) 1620  Mobility Specialist Time Calculation (min) (ACUTE ONLY) 15 min   Pt received in BR, agreeable to ambulate in hallway. BM unsuccessful. MinA to stand from lower toilet seat level. Pt denied any discomfort during ambulation, asx throughout. VSS. Pt left EOB for NT to assist with bath.  Leory Plowman  Mobility Specialist Please contact via [employer] office at [phone]

## 2023-07-10 ENCOUNTER — Other Ambulatory Visit: Payer: Self-pay | Admitting: Thoracic Surgery (Cardiothoracic Vascular Surgery)

## 2023-07-10 DIAGNOSIS — I509 Heart failure, unspecified: Secondary | ICD-10-CM | POA: Diagnosis not present

## 2023-07-10 DIAGNOSIS — Z9889 Other specified postprocedural states: Secondary | ICD-10-CM

## 2023-07-10 DIAGNOSIS — I059 Rheumatic mitral valve disease, unspecified: Secondary | ICD-10-CM | POA: Diagnosis not present

## 2023-07-10 LAB — BASIC METABOLIC PANEL
Anion gap: 8 (ref 5–15)
BUN: 13 mg/dL (ref 6–20)
CO2: 23 mmol/L (ref 22–32)
Calcium: 8.6 mg/dL — ABNORMAL LOW (ref 8.9–10.3)
Chloride: 96 mmol/L — ABNORMAL LOW (ref 98–111)
Creatinine, Ser: 0.76 mg/dL (ref 0.61–1.24)
GFR, Estimated: >60 mL/min (ref >60)
Glucose, Bld: 122 mg/dL — ABNORMAL HIGH (ref 70–99)
Potassium: 4.4 mmol/L (ref 3.5–5.1)
Sodium: 127 mmol/L — ABNORMAL LOW (ref 135–145)

## 2023-07-10 LAB — CBC
HCT: 22.4 % — ABNORMAL LOW (ref 39.0–52.0)
Hemoglobin: 7.2 g/dL — ABNORMAL LOW (ref 13.0–17.0)
MCH: 29 pg (ref 26.0–34.0)
MCHC: 32.1 g/dL (ref 30.0–36.0)
MCV: 90.3 fL (ref 80.0–100.0)
Platelets: 205 10*3/uL (ref 150–400)
RBC: 2.48 MIL/uL — ABNORMAL LOW (ref 4.22–5.81)
RDW: 15.2 % (ref 11.5–15.5)
WBC: 8.9 10*3/uL (ref 4.0–10.5)
nRBC: 0 % (ref 0.0–0.2)

## 2023-07-10 LAB — PROTIME-INR
INR: 1.9 — ABNORMAL HIGH (ref 0.8–1.2)
Prothrombin Time: 21.8 s — ABNORMAL HIGH (ref 11.4–15.2)

## 2023-07-10 LAB — HEMOGLOBIN AND HEMATOCRIT, BLOOD
HCT: 27.5 % — ABNORMAL LOW (ref 39.0–52.0)
Hemoglobin: 8.9 g/dL — ABNORMAL LOW (ref 13.0–17.0)

## 2023-07-10 LAB — PREPARE RBC (CROSSMATCH)

## 2023-07-10 MED ORDER — FUROSEMIDE 40 MG PO TABS
40.0000 mg | ORAL_TABLET | Freq: Every day | ORAL | Status: DC
  Administered 2023-07-10: 40 mg via ORAL
  Filled 2023-07-10: qty 1

## 2023-07-10 MED ORDER — SODIUM CHLORIDE 0.9% IV SOLUTION: Freq: Once | INTRAVENOUS | Status: AC

## 2023-07-10 MED ORDER — WARFARIN SODIUM 5 MG PO TABS
5.0000 mg | ORAL_TABLET | Freq: Once | ORAL | Status: AC
  Administered 2023-07-10: 5 mg via ORAL
  Filled 2023-07-10: qty 1

## 2023-07-10 MED ORDER — SODIUM CHLORIDE 0.9 % IV SOLN
125.0000 mg | Freq: Once | INTRAVENOUS | Status: AC
  Administered 2023-07-10: 125 mg via INTRAVENOUS
  Filled 2023-07-10: qty 10

## 2023-07-10 MED ORDER — POTASSIUM CHLORIDE CRYS ER 20 MEQ PO TBCR
20.0000 meq | EXTENDED_RELEASE_TABLET | Freq: Every day | ORAL | Status: DC
  Administered 2023-07-10 – 2023-07-13 (×4): 20 meq via ORAL
  Filled 2023-07-10 (×4): qty 1

## 2023-07-10 MED ORDER — TORSEMIDE 20 MG PO TABS
20.0000 mg | ORAL_TABLET | Freq: Every day | ORAL | Status: DC
  Administered 2023-07-10 – 2023-07-12 (×3): 20 mg via ORAL
  Filled 2023-07-10 (×4): qty 1

## 2023-07-10 NOTE — Progress Notes (Addendum)
Pt lethargic, will f/u later to mobilize if able.   F/U at 1114  CARDIAC REHAB PHASE I   PRE:  Rate/Rhythm: 75 NSR  BP:  Sitting: 135/87      SpO2: 94 RA  MODE:  Ambulation: 180 ft    POST:  Rate/Rhythm: 83 NSR  BP:  Sitting: 146/57      SpO2: 94 RA  Pt agreeable to ambulate in the hallway, pt able to stand w/o physical assistance after raising bed. Pt given verbal reminder to avoid pushing down while standing. Once standing, pt ambulated at slow steady pace in he hallway with RW. Pt asymptomatic throughout session and was assisted to chair with call button in hand.  Faustino Congress  MS, ACSM-CEP 11:49 AM 07/10/2023   Service time is from 1114 to 1149.

## 2023-07-10 NOTE — Progress Notes (Signed)
Mobility Specialist Progress Note;   07/10/23 1535  Mobility  Activity Ambulated with assistance in hallway  Level of Assistance Contact guard assist, steadying assist  Assistive Device Front wheel walker  Distance Ambulated (ft) 250 ft  RUE Weight Bearing Per Provider Order NWB  LUE Weight Bearing Per Provider Order NWB  Activity Response Tolerated well  Mobility Referral Yes  Mobility visit 1 Mobility  Mobility Specialist Start Time (ACUTE ONLY) 1535  Mobility Specialist Stop Time (ACUTE ONLY) 1555  Mobility Specialist Time Calculation (min) (ACUTE ONLY) 20 min   Pt agreeable to mobility. Receiving blood however RN said ambulation was fine. Required MinG assistance during ambulation for safety. Mobility done w/ precautions successfully. VSS throughout and no c/o during session. Pt returned back to bed with all needs met.  Caesar Bookman Mobility Specialist Please contact via [employer] or [employer] [phone]

## 2023-07-10 NOTE — Progress Notes (Signed)
[address]       [place] [address]             [phone]      8 Days Post-Op Procedure(s) (LRB): EXPLORATION POST OPERATIVE OPEN HEART (N/A) Subjective: The patient has no new complaints this AM. States the plan is to go home with his son.   Objective: Vital signs in last 24 hours: Temp:  [97.3 F (36.3 C)-99.5 F (37.5 C)] 98.9 F (37.2 C) (12/18 0349) Pulse Rate:  [63-68] 67 (12/18 0349) Cardiac Rhythm: Normal sinus rhythm;Bundle branch block (12/17 1930) Resp:  [16-20] 20 (12/18 0349) BP: (113-139)/(49-66) 114/50 (12/18 0349) SpO2:  [95 %-98 %] 98 % (12/18 0349) Weight:  [90.3 kg] 90.3 kg (12/18 0349)  Hemodynamic parameters for last 24 hours:    Intake/Output from previous day: 12/17 0701 - 12/18 0700 In: 1089.3 [P.O.:240; IV Piggyback:849.3] Out: 1600 [Urine:1600] Intake/Output this shift: No intake/output data recorded.  General appearance: alert, cooperative, and no distress Neurologic: intact Heart: regular rate and rhythm, S1, S2 normal, no murmur, click, rub or gallop Lungs: clear to auscultation bilaterally Abdomen: soft, non-tender; bowel sounds normal; no masses,  no organomegaly Extremities: edema trace-1+ Wound: Clean and dry without sign of infection  Lab Results: Recent Labs    07/09/23 0430 07/10/23 0500  WBC 9.6 8.9  HGB 7.4* 7.2*  HCT 22.7* 22.4*  PLT 203 205   BMET:  Recent Labs    07/09/23 0430 07/10/23 0500  NA 127* 127*  K 4.7 4.4  CL 94* 96*  CO2 24 23  GLUCOSE 120* 122*  BUN 14 13  CREATININE 0.81 0.76  CALCIUM 8.8* 8.6*    PT/INR:  Recent Labs    07/10/23 0500  LABPROT 21.8*  INR 1.9*   ABG    Component Value Date/Time   PHART 7.401 07/03/2023 0800   HCO3 20.1 07/03/2023 0800   TCO2 21 (L) 07/03/2023 0800   ACIDBASEDEF 4.0 (H) 07/03/2023 0800   O2SAT 99 07/03/2023 0800   CBG (last 3)  Recent Labs    07/09/23 1150 07/09/23 1643 07/09/23 2108  GLUCAP 118* 136* 120*     Assessment/Plan: S/P Procedure(s) (LRB): EXPLORATION POST OPERATIVE OPEN HEART (N/A)  CV: BP controlled. On Losartan 25mg  daily, Lopressor 25mg  BID, Spironolactone 12.5mg  daily. No sign of atrial fibrillation and prolonged QT interval. Prophylactic Amiodarone has been d/c'd. NSR, HR 60s   Pulm: Saturating well on RA. CXR with persistent but improving right pleural effusion. Encourage IS and ambulation. Continue diuresis.    GI: Liver cirrhosis and daily alcohol use. On thiamine, folic acid and MVI. Esophageal candidiasis found on EGD preop, continue fluconazole x 14d. +BM. Tolerating a diet.    Endo: Preop A1C 5.9, likely prediabetes. No home meds. CBGs controlled on SSI. Will need close outpatient medicine follow up. Will d/c SSI and CBGs.   Renal: Cr 0.76. UO 1600cc/24hrs. +8lb from preop. Responded well to Torsemide. Will start Lasix, continue Spironolactone. Hyponatremia stable, Na 127. Monitor   ID: MV endocarditis, +strep salivarius on IV PCN-G. PICC placed yesterday. Will continue IV PCN-G x 4 weeks postop per ID with an end date of 01/07. ID follow up has been arranged.     Expected postop ABLA on likely chronic anemia: Hemoglobin 6.6 preop, colonoscopy with large ulcerated polyp removed with a cold snare. Follow up with GI is arranged. H/H 7.2/22.4 today after 1U PRBCs 12/16. On iron supplement. Monitor closely.  INR: Goal INR 2.5-3.5. INR 1.9 today, unchanged after Coumadin 5mg  yesterday. Coumadin dosing per pharmacy. Careful dosing since he is on Amiodarone and Diflucan   Dispo: Pt transferred back to hospitalist service on 12/14. Pt needs to get INR closer to goal before d/c. Home health at discharge, pt to go home with son.      LOS: 22 days    Jenny Reichmann, PA-C 07/10/2023

## 2023-07-10 NOTE — Progress Notes (Signed)
PT Cancellation Note  Patient Details Name: Abou Prichett MRN: [id number] DOB: [birth year]   Cancelled Treatment:    Reason Eval/Treat Not Completed: Patient at procedure or test/unavailable;Medical issues which prohibited therapy (This morning patient unavailable, working with cardiac rehab. In the afternoon, patient sleeping soundly and getting a unit of blood. PT will continue with attempts)  Donna Bernard, PT, MPT  Ina Homes 07/10/2023, 2:49 PM

## 2023-07-10 NOTE — Progress Notes (Signed)
ANTICOAGULATION CONSULT NOTE  Pharmacy Consult for heparin no bolus + warfarin > warfarin only Indication: s/p mechanical MVR 12/10  Allergies  Allergen Reactions   Iodinated Contrast Media Itching   Gadolinium Derivatives     Patient Measurements: Height: 5\' 7"  (170.2 cm) Weight: 90.3 kg (199 lb) IBW/kg (Calculated) : 66.1 Heparin Dosing Weight: 83 kg  Vital Signs: Temp: 98.9 F (37.2 C) (12/18 0349) Temp Source: Oral (12/18 0349) BP: 114/50 (12/18 0349) Pulse Rate: 67 (12/18 0349)  Labs: Recent Labs    07/08/23 0320 07/08/23 2030 07/09/23 0430 07/10/23 0500  HGB 6.7* 7.7* 7.4* 7.2*  HCT 21.1* 23.7* 22.7* 22.4*  PLT 176  --  203 205  LABPROT 23.3*  --  21.6* 21.8*  INR 2.0*  --  1.9* 1.9*  HEPARINUNFRC 0.51  --   --   --   CREATININE 0.79  --  0.81 0.76    Estimated Creatinine Clearance: 113.2 mL/min (by C-G formula based on SCr of 0.76 mg/dL).  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 Not on anticoagulation prior to admission.  Pharmacy consulted for heparin and warfarin dosing starting POD#2 (12/12).  Of note, currently on fluconazole for esophageal candidiasis (to stop 12/20). Postop amio ppx x5 days was completed 12/16. Both medications reduce warfarin metabolism / enhance anticoagulant effect.  12/18 AM: INR remains 1.9, subtherapeutic after warfarin 5 mg x1 yesterday. Remains on fluconazole, amiodarone course complete. No meal documentation, feeding supplement shake added yesterday. Will give another 5 mg dose this evening to push INR towards goal of 2.5. Hgb stable low at 7.2, PLT stable at 205, s/p 1 unit PRBC on 12/16.  Patient not currently on heparin bridge with subtherapeutic INR, MD aware.   Goal of Therapy:  INR  2.5-3.5 Monitor platelets by anticoagulation protocol: Yes   Plan:  Warfarin 5 mg x1 tonight Monitor daily INR and CBC Monitor food intake and drug interactions  Enos Fling, PharmD PGY-1 Acute Care Pharmacy Resident 07/10/2023 7:25 AM

## 2023-07-11 DIAGNOSIS — I059 Rheumatic mitral valve disease, unspecified: Secondary | ICD-10-CM | POA: Diagnosis not present

## 2023-07-11 DIAGNOSIS — I509 Heart failure, unspecified: Secondary | ICD-10-CM | POA: Diagnosis not present

## 2023-07-11 LAB — CBC
HCT: 26.8 % — ABNORMAL LOW (ref 39.0–52.0)
Hemoglobin: 8.7 g/dL — ABNORMAL LOW (ref 13.0–17.0)
MCH: 28.9 pg (ref 26.0–34.0)
MCHC: 32.5 g/dL (ref 30.0–36.0)
MCV: 89 fL (ref 80.0–100.0)
Platelets: 218 10*3/uL (ref 150–400)
RBC: 3.01 MIL/uL — ABNORMAL LOW (ref 4.22–5.81)
RDW: 15.7 % — ABNORMAL HIGH (ref 11.5–15.5)
WBC: 8.9 10*3/uL (ref 4.0–10.5)
nRBC: 0 % (ref 0.0–0.2)

## 2023-07-11 LAB — BASIC METABOLIC PANEL
Anion gap: 9 (ref 5–15)
BUN: 12 mg/dL (ref 6–20)
CO2: 23 mmol/L (ref 22–32)
Calcium: 8.7 mg/dL — ABNORMAL LOW (ref 8.9–10.3)
Chloride: 97 mmol/L — ABNORMAL LOW (ref 98–111)
Creatinine, Ser: 0.77 mg/dL (ref 0.61–1.24)
GFR, Estimated: >60 mL/min (ref >60)
Glucose, Bld: 117 mg/dL — ABNORMAL HIGH (ref 70–99)
Potassium: 4 mmol/L (ref 3.5–5.1)
Sodium: 129 mmol/L — ABNORMAL LOW (ref 135–145)

## 2023-07-11 LAB — TYPE AND SCREEN
ABO/RH(D): A POS
Antibody Screen: NEGATIVE
Unit division: 0
Unit division: 0

## 2023-07-11 LAB — BPAM RBC
Blood Product Expiration Date: 202412312359
Blood Product Expiration Date: 202501062359
ISSUE DATE / TIME: 202412161321
ISSUE DATE / TIME: 202412181337
Unit Type and Rh: 6200
Unit Type and Rh: 6200

## 2023-07-11 LAB — PROTIME-INR
INR: 1.9 — ABNORMAL HIGH (ref 0.8–1.2)
Prothrombin Time: 22.1 s — ABNORMAL HIGH (ref 11.4–15.2)

## 2023-07-11 MED ORDER — WARFARIN SODIUM 2.5 MG PO TABS: 2.5000 mg | ORAL_TABLET | Freq: Once | ORAL | Status: DC

## 2023-07-11 MED ORDER — WARFARIN SODIUM 5 MG PO TABS
5.0000 mg | ORAL_TABLET | Freq: Once | ORAL | Status: AC
  Administered 2023-07-11: 5 mg via ORAL
  Filled 2023-07-11: qty 1

## 2023-07-11 MED FILL — Mannitol IV Soln 20%: INTRAVENOUS | Qty: 500 | Status: AC

## 2023-07-11 MED FILL — Sodium Bicarbonate IV Soln 8.4%: INTRAVENOUS | Qty: 50 | Status: AC

## 2023-07-11 MED FILL — Sodium Chloride IV Soln 0.9%: INTRAVENOUS | Qty: 2000 | Status: AC

## 2023-07-11 MED FILL — Sodium Chloride IV Soln 0.9%: INTRAVENOUS | Qty: 1000 | Status: AC

## 2023-07-11 MED FILL — Heparin Sodium (Porcine) Inj 1000 Unit/ML: INTRAMUSCULAR | Qty: 40 | Status: AC

## 2023-07-11 MED FILL — Calcium Chloride Inj 10%: INTRAVENOUS | Qty: 10 | Status: AC

## 2023-07-11 MED FILL — Electrolyte-R (PH 7.4) Solution: INTRAVENOUS | Qty: 3000 | Status: AC

## 2023-07-11 NOTE — Progress Notes (Signed)
PROGRESS NOTE    Victor [name]  HYQ:[id number] DOB: 01-24-1971 DOA: 06/17/2023 PCP: Claiborne Rigg, NP  52/M w hyperlipidemia, obesity, hypertension EtOH use, liver cirrhosis presented to hospital with exertional shortness of breath, bilateral lower extremity edema and pain for 2 weeks.  Diagnosed with Strepotcoccal Bacteremmia and Mitral Valve Endocarditis, Repeat blood cultures negative,  -11/29 TEE noted subaortic membrane with dynamic outflow tract obstruction due to chordal SAM up to 40 mmHg and mitral valve vegetations with degenerative calcified leaflets and severe mitral regurgitation.>-Cardiothoracic surgery was consulted -12/2 cardiac cath>minimal nonobstructive CAD with elevated V waves and filling pressures as well as a subaortic gradient with increased PVCs suggesting potential hypertrophic cardiomyopathy.   -12/2 cardiac MRI moderate asymmetric basal to mid septal hypertrophy. Suspect LV outflow gradient given narrowed LVOT and turbulence in LVOT. Positive mitral valve cordal SAM or MV SAM, Moderate to severe mitral regurgitation. 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  -12/10 underwent MVR > Dr. 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  -Post operative admitted to the ICU, on mechanical ventilatory support.  12/11: extubated early AM POD 1. Pressors off. Tachypneic with small breaths. +diuretics. Dc swan and a-line   12/14 transferred back to [facility].  12/15 clinically stable, will need outpatient antibiotic therapy, he would like to go to short term rehab.  12/16 pending INR to be therapeutic in order to discharge home, he has no SNF insurance coverage.  -12/18, transfused PRBC, torsemide added   Subjective: Feels well, urinated a lot yesterday, breathing is improving,  ambulated in the halls  Assessment and Plan:  Severe mitral valve endocarditis Streptococcal bacteremia.  Sp mitral valve replacement, mechanical prosthesis St Jude.  -continue antibiotics with Penicillin to complete 4 weeks post operative end date 1/7, ID follow-up made.  -PICC line placed -Continue warfarin with heparin bridge, INR is 1.9, discharge when INR 2.5 or above -Prophylactic amiodarone discontinued -Discharge planning  Post operative right pleural effusion/ atelectasis.  Continue diuresis and incentive spirometer.   Acute on chronic diastolic CHF (congestive heart failure) (HCC) Hyponatremia -Echo with EF 70-75%, narrow LVOT with peak gradient 48 mmHg, RV preserved, moderate mitral stenosis, moderate aortic stenosis  -TEE with no aortic stenosis, positive SAM of the chordal and subaortic membrane. Severe mitral valve regurgitation with positive vegetation, mild MV stenosis,.  -Has been diuresed with IV Lasix, volume status has improved -Continue metoprolol, losartan and Aldactone -Restarted torsemide, brisk urine output yesterday, monitor  HTN (hypertension) Stable, meds as above  Severe iron deficiency anemia Chronic iron deficiency anemia worsened by postop blood loss  -No overt bleeding reported, transfused 1 unit PRBC 12/18 -Anemia panel with severe iron deficiency, given IV iron -CBC in a.m.  Esophageal candidiasis (HCC) Continue with fluconazole, plan for 14 days of therapy total.   Type 2 diabetes mellitus  Glucose has been stable, discontinued insulin therapy and check CBG as needed.    DVT prophylaxis: Warfarin Code Status: Full Code Family Communication: None present Di sposition Plan: Home likely 1 to 2 days  Consultants:    Procedures:   Antimicrobials:    Objective: Vitals:   07/11/23 0244 07/11/23 0500 07/11/23 0815 07/11/23 1238  BP: (!) 123/58  (!) 109/59 126/66  Pulse: 73 64 72 72  Resp: 17 18 20 17   Temp: 98.7 F (37.1 C)  97.7  F (36.5 C) 98 F (  36.7 C)  TempSrc: Oral  Oral Oral  SpO2: 96% 92% 97%   Weight:  88.5 kg    Height:        Intake/Output Summary (Last 24 hours) at 07/11/2023 1243 Last data filed at 07/11/2023 1140 Gross per 24 hour  Intake 1845.09 ml  Output 3850 ml  Net -2004.91 ml   Filed Weights   07/09/23 0500 07/10/23 0349 07/11/23 0500  Weight: 91.1 kg 90.3 kg 88.5 kg    Examination:  Pleasant chronically ill male sitting up in bed, AAOx3 HEENT: No JVD CVS: S1-S2, regular rhythm, chest wall with incisions healing well Lungs: Decreased breath sounds at the bases Abdomen: Soft, nontender, bowel sounds present Extremities: No edema     Data Reviewed:   CBC: Recent Labs  Lab 07/07/23 0335 07/08/23 0320 07/08/23 2030 07/09/23 0430 07/10/23 0500 07/10/23 2035 07/11/23 0438  WBC 8.8 8.6  --  9.6 8.9  --  8.9  HGB 7.0* 6.7* 7.7* 7.4* 7.2* 8.9* 8.7*  HCT 21.6* 21.1* 23.7* 22.7* 22.4* 27.5* 26.8*  MCV 91.1 91.7  --  89.4 90.3  --  89.0  PLT 163 176  --  203 205  --  218   Basic Metabolic Panel: Recent Labs  Lab 07/05/23 0454 07/06/23 0245 07/07/23 0335 07/08/23 0320 07/09/23 0430 07/10/23 0500 07/11/23 0438  NA 128*   < > 129* 128* 127* 127* 129*  K 4.4   < > 4.1 4.5 4.7 4.4 4.0  CL 95*   < > 93* 96* 94* 96* 97*  CO2 23   < > 22 24 24 23 23   GLUCOSE 140*   < > 119* 117* 120* 122* 117*  BUN 12   < > 15 15 14 13 12   CREATININE 0.62   < > 0.73 0.79 0.81 0.76 0.77  CALCIUM 8.5*   < > 8.5* 8.5* 8.8* 8.6* 8.7*  MG 2.1  --   --  2.0  --   --   --    < > = values in this interval not displayed.   GFR: Estimated Creatinine Clearance: 112.1 mL/min (by C-G formula based on SCr of 0.77 mg/dL). Liver Function Tests: Recent Labs  Lab 07/05/23 0454  AST 29  ALT 17  ALKPHOS 47  BILITOT 1.0  PROT 6.0*  ALBUMIN 2.5*   No results for input(s): "LIPASE", "AMYLASE" in the last 168 hours. No results for input(s): "AMMONIA" in the last 168 hours. Coagulation  Profile: Recent Labs  Lab 07/07/23 0335 07/08/23 0320 07/09/23 0430 07/10/23 0500 07/11/23 0438  INR 1.8* 2.0* 1.9* 1.9* 1.9*   Cardiac Enzymes: No results for input(s): "CKTOTAL", "CKMB", "CKMBINDEX", "TROPONINI" in the last 168 hours. BNP (last 3 results) No results for input(s): "PROBNP" in the last 8760 hours. HbA1C: No results for input(s): "HGBA1C" in the last 72 hours. CBG: Recent Labs  Lab 07/08/23 2131 07/09/23 0619 07/09/23 1150 07/09/23 1643 07/09/23 2108  GLUCAP 120* 143* 118* 136* 120*   Lipid Profile: No results for input(s): "CHOL", "HDL", "LDLCALC", "TRIG", "CHOLHDL", "LDLDIRECT" in the last 72 hours. Thyroid Function Tests: No results for input(s): "TSH", "T4TOTAL", "FREET4", "T3FREE", "THYROIDAB" in the last 72 hours. Anemia Panel: No results for input(s): "VITAMINB12", "FOLATE", "FERRITIN", "TIBC", "IRON", "RETICCTPCT" in the last 72 hours. Urine analysis:    Component Value Date/Time   COLORURINE YELLOW 06/19/2023 0045   APPEARANCEUR CLEAR 06/19/2023 0045   LABSPEC 1.011 06/19/2023 0045   PHURINE 7.0 06/19/2023 0045   GLUCOSEU NEGATIVE  06/19/2023 0045   HGBUR NEGATIVE 06/19/2023 0045   BILIRUBINUR NEGATIVE 06/19/2023 0045   KETONESUR NEGATIVE 06/19/2023 0045   PROTEINUR NEGATIVE 06/19/2023 0045   UROBILINOGEN 1.0 10/15/2013 1349   NITRITE NEGATIVE 06/19/2023 0045   LEUKOCYTESUR NEGATIVE 06/19/2023 0045   Sepsis Labs: @LABRCNTIP (procalcitonin:4,lacticidven:4)  ) Recent Results (from the past 240 hours)  Surgical pcr screen     Status: None   Collection Time: 07/01/23  3:07 PM   Specimen: Nasal Mucosa; Nasal Swab  Result Value Ref Range Status   MRSA, PCR NEGATIVE NEGATIVE Final   Staphylococcus aureus NEGATIVE NEGATIVE Final    Comment: (NOTE) The Xpert SA Assay (FDA approved for NASAL specimens in patients 16 years of age and older), is one component of a comprehensive surveillance program. It is not intended to diagnose infection nor  to guide or monitor treatment. Performed at [facility], [address], [place], [place] [address]   Aerobic/Anaerobic Culture w Gram Stain (surgical/deep wound)     Status: None   Collection Time: 07/02/23  9:15 AM   Specimen: Mitral Valve Leaflets; Tissue  Result Value Ref Range Status   Specimen Description TISSUE  Final   Special Requests mitral valve  Final   Gram Stain NO WBC SEEN NO ORGANISMS SEEN   Final   Culture   Final    No growth aerobically or anaerobically. Performed at [facility], [address], [place], [place] [address]    Report Status 07/07/2023 FINAL  Final     Radiology Studies: No results found.   Scheduled Meds:  Chlorhexidine Gluconate Cloth  6 each Topical Daily   docusate sodium  200 mg Oral Daily   feeding supplement (GLUCERNA SHAKE)  237 mL Oral TID BM   ferrous gluconate  324 mg Oral Q breakfast   fluconazole  200 mg Oral Daily   gabapentin  300 mg Oral TID   losartan  25 mg Oral Daily   metoprolol tartrate  25 mg Oral BID   nicotine  14 mg Transdermal Daily   pantoprazole  40 mg Oral Daily   potassium chloride  20 mEq Oral Daily   rosuvastatin  20 mg Oral Daily   sodium chloride flush  10-40 mL Intracatheter Q12H   sodium chloride flush  3 mL Intravenous Q12H   spironolactone  12.5 mg Oral Daily   torsemide  20 mg Oral Daily   traZODone  100 mg Oral QHS   warfarin  5 mg Oral ONCE-1600   Warfarin - Pharmacist Dosing Inpatient   Does not apply q1600   Continuous Infusions:  penicillin G potassium 12 Million Units in dextrose 5 % 500 mL CONTINUOUS infusion 12 Million Units (07/11/23 0801)     LOS: 23 days    Time spent:    Zannie Cove, MD [facility]   07/11/2023, 12:43 PM

## 2023-07-11 NOTE — Progress Notes (Signed)
ANTICOAGULATION CONSULT NOTE  Pharmacy Consult for heparin no bolus + warfarin > warfarin only Indication: s/p mechanical MVR 12/10  Allergies  Allergen Reactions   Iodinated Contrast Media Itching   Gadolinium Derivatives     Patient Measurements: Height: 5\' 7"  (170.2 cm) Weight: 88.5 kg (195 lb 3.2 oz) IBW/kg (Calculated) : 66.1 Heparin Dosing Weight: 83 kg  Vital Signs: Temp: 97.7 F (36.5 C) (12/19 0815) Temp Source: Oral (12/19 0815) BP: 109/59 (12/19 0815) Pulse Rate: 72 (12/19 0815)  Labs: Recent Labs    07/09/23 0430 07/10/23 0500 07/10/23 2035 07/11/23 0438  HGB 7.4* 7.2* 8.9* 8.7*  HCT 22.7* 22.4* 27.5* 26.8*  PLT 203 205  --  218  LABPROT 21.6* 21.8*  --  22.1*  INR 1.9* 1.9*  --  1.9*  CREATININE 0.81 0.76  --  0.77    Estimated Creatinine Clearance: 112.1 mL/min (by C-G formula based on SCr of 0.77 mg/dL).  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 Not on anticoagulation prior to admission. Pharmacy consulted for heparin and warfarin dosing starting POD#2 (12/12).  Of note, currently on fluconazole for esophageal candidiasis (to stop 12/20). Postop amio ppx x5 days was completed 12/16. Both medications reduce warfarin metabolism / enhance anticoagulant effect.  12/19 AM: INR remains 1.9, subtherapeutic after warfarin 5 mg x1 yesterday. Remains on fluconazole, amiodarone course complete. No meal documentation, feeding supplement shake added 12/17. Will give another 5 mg dose this evening to push INR towards goal of 2.5. Hgb stable at 8.7, PLT stable at 218, s/p 1 pRBC yesterday. Patient not currently on heparin bridge with subtherapeutic INR, MD aware.   Goal of Therapy:  INR 2.5-3.5 Monitor platelets by anticoagulation  protocol: Yes   Plan:  Warfarin 5 mg x1 tonight Monitor daily INR and CBC Monitor food intake and drug interactions  Roslyn Smiling, PharmD PGY1 Pharmacy Resident 07/11/2023 9:45 AM

## 2023-07-11 NOTE — Progress Notes (Signed)
MSI with mild serosanguinous ooze from proximal and distal spots.  Cleansed and dressed with dry gauze.  Dressing covering CT sutures also changed at this time.  Will cont plan of care

## 2023-07-11 NOTE — Progress Notes (Signed)
Physical Therapy Treatment Patient Details Name: Victor [name] MRN: [id number] DOB: 26-Mar-1971 Today's Date: 07/11/2023   History of Present Illness 52 year old male who was admitted on 11/26 with SOB and LE edema, pt now s/p MVR on 12/10. PMH: hyperlipidemia, obesity, hypertension, and chronic back pain    PT Comments  Pt is slowly progressing towards goals. Improved mobility this session without rest breaks. Continues to require supervision for safety and Min A for sit to stand today due to fatigue. Pt requires occasional verbal cues to maintain sternal precautions in order to protect the integrity of surgical site. Due to pt current functional status, home set up and available assistance at home no recommended skilled physical therapy services at this time on discharge from acute care hospital setting. Will continue to follow in acute setting in order to ensure that pt returns home with decreased risk for falls, injury, re-hospitalization and improved activity tolerance. Pt may benefit from cardiac rehab on discharge to improve activity tolerance.      If plan is discharge home, recommend the following: A little help with bathing/dressing/bathroom;Help with stairs or ramp for entrance;Assist for transportation;Assistance with cooking/housework;A little help with walking and/or transfers     Equipment Recommendations  Rollator (4 wheels)       Precautions / Restrictions Precautions Precautions: Sternal;Fall Precaution Booklet Issued: No Precaution Comments: reviewed sternal precautions Restrictions Weight Bearing Restrictions Per Provider Order: Yes RUE Weight Bearing Per Provider Order: Non weight bearing LUE Weight Bearing Per Provider Order: Non weight bearing Other Position/Activity Restrictions: sternal precautions     Mobility  Bed Mobility Overal bed mobility: Needs Assistance Bed Mobility: Sit to Supine         Sit to sidelying: Supervision, HOB elevated       Transfers Overall transfer level: Needs assistance Equipment used: Rolling walker (2 wheels) Transfers: Sit to/from Stand Sit to Stand: Min assist           General transfer comment: Min A pt requires Min A for momentum to get to standing from sitting.    Ambulation/Gait Ambulation/Gait assistance: Supervision Gait Distance (Feet): 250 Feet Assistive device: Rolling walker (2 wheels) Gait Pattern/deviations: Step-through pattern, Decreased stride length Gait velocity: slowed Gait velocity interpretation: 1.31 - 2.62 ft/sec, indicative of limited community ambulator   General Gait Details: Pt abulated in room with no AD. No needed rest breaks today.        Balance Overall balance assessment: Needs assistance Sitting-balance support: No upper extremity supported Sitting balance-Leahy Scale: Good     Standing balance support: Bilateral upper extremity supported, No upper extremity supported Standing balance-Leahy Scale: Fair      Cognition Arousal: Alert Behavior During Therapy: WFL for tasks assessed/performed Overall Cognitive Status: Within Functional Limits for tasks assessed           General Comments General comments (skin integrity, edema, etc.): vital signs on room air WNL.      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, get back to work painting PT Goal Formulation: With patient Time For Goal Achievement:  07/17/23 Potential to Achieve Goals:  Good Progress towards PT goals: Progressing toward goals    Frequency    Min 1X/week      PT Plan  Continue with current POC        AM-PAC PT "6 Clicks" Mobility   Outcome Measure  Help needed turning from your back to your side while in a flat bed without using bedrails?: A Little Help needed moving from lying on your back to sitting on the side of a flat bed without using bedrails?: A Little Help needed moving to and from a bed to a chair (including a wheelchair)?: A Little Help needed standing up from a chair using your arms (e.g., wheelchair or bedside chair)?: A Little Help needed to walk in hospital room?: None Help needed climbing 3-5 steps with a railing? : None 6 Click Score: 20    End of Session Equipment Utilized During Treatment: Gait belt Activity Tolerance: Patient tolerated treatment well Patient left: with call bell/phone within reach;Other (comment) (in bathroom, on pt request will call nursing staff when done. Pt is alert and oriented x 4 and aware to not stand up alone.)   PT Visit Diagnosis: Unsteadiness on feet (R26.81);Difficulty in walking, not elsewhere classified (R26.2);Muscle weakness (generalized) (M62.81);Other abnormalities of gait and mobility (R26.89)     Time: 4403-4742 PT Time Calculation (min) (ACUTE ONLY): 18 min  Charges:    $Therapeutic Activity: 8-22 mins PT General Charges $$ ACUTE PT VISIT: 1 Visit                     Harrel Carina, DPT, CLT  Acute Rehabilitation Services Office: [phone] (Secure chat preferred)    Claudia Desanctis 07/11/2023, 4:38 PM

## 2023-07-11 NOTE — Progress Notes (Signed)
CARDIAC REHAB PHASE I   PRE:  Rate/Rhythm: 70 NSR  BP:  Sitting: 124/63      SpO2: 94 RA  MODE:  Ambulation: 180 ft    POST:  Rate/Rhythm: 83 NSR  BP:  Sitting: 127/60      SpO2: 94 RA  Pt agreeable to ambulation, pt stood without assistance following SP and ambulated in the hallway with RW and standby assistance. Pt returned to bed and examined after reporting feeling fluid" running down his incision. Pt examined and nurse applying gauze.   Faustino Congress  MS, ACSM-CEP 11:35 AM 07/11/2023    Service time is from 1113 to 1135.

## 2023-07-11 NOTE — Progress Notes (Signed)
Occupational Therapy Treatment Patient Details Name: Victor [name] MRN: [id number] DOB: [birth year] Today's Date: 07/11/2023   History of present illness 52 year old male who was admitted on 11/26 with SOB and LE edema, pt now s/p MVR on 12/10. PMH: hyperlipidemia, obesity, hypertension, and chronic back pain   OT comments  Pt at this time presented in bed and agreed to session. He completed sit to stand transfers from supervision to min guard from both high and low surfaces. Pt was set up for sponge bath at sink and was primarily able to completed in standing with cues on following sternal precautions and needed just min assist for peri care. However, he was educated on AE he could use to assist to increase in ability to complete while following precautions. He voiced an understanding and agreeable to dc with son assistance as needed.      If plan is discharge home, recommend the following:  Assist for transportation;A little help with walking and/or transfers;A little help with bathing/dressing/bathroom;Assistance with cooking/housework;Help with stairs or ramp for entrance   Equipment Recommendations  None recommended by OT    Recommendations for Other Services      Precautions / Restrictions Precautions Precautions: Sternal;Fall Precaution Booklet Issued: No Precaution Comments: reviewed sternal precautions with ADLs as difficulties with access for following BMs Restrictions Weight Bearing Restrictions Per Provider Order: Yes RUE Weight Bearing Per Provider Order: Non weight bearing LUE Weight Bearing Per Provider Order: Non weight bearing Other Position/Activity Restrictions: sternal precautions       Mobility Bed Mobility Overal bed mobility: Needs Assistance Bed Mobility: Supine to Sit Rolling: Supervision Sidelying to sit: Supervision, HOB elevated Supine to sit: Supervision, HOB elevated   Sit to sidelying: Supervision, HOB elevated      Transfers Overall transfer  level: Needs assistance Equipment used: Rolling walker (2 wheels) Transfers: Sit to/from Stand Sit to Stand: Contact guard assist           General transfer comment: Pt was able to complete transfer from standard chair in room with CGA and higher surfaces with supervision with rocking movement and increase in time to position self     Balance Overall balance assessment: Needs assistance Sitting-balance support: No upper extremity supported Sitting balance-Leahy Scale: Good     Standing balance support: Bilateral upper extremity supported, No upper extremity supported Standing balance-Leahy Scale: Fair                             ADL either performed or assessed with clinical judgement   ADL Overall ADL's : Needs assistance/impaired Eating/Feeding: Modified independent Eating/Feeding Details (indicate cue type and reason): increase in time as does not hvae teeth Grooming: Wash/dry hands;Wash/dry face;Set up;Sitting;Standing   Upper Body Bathing: Minimal assistance;Supervision/ safety Upper Body Bathing Details (indicate cue type and reason): Pt ablet o complete anterior portion but back and tops of shoulder sometimes neds min assist Lower Body Bathing: Minimal assistance;Sitting/lateral leans;Sit to/from stand Lower Body Bathing Details (indicate cue type and reason): to be able to complete for good quality peri care pt however was educated on AE to increase in access with peri care and bathing Upper Body Dressing : Supervision/safety;Sitting   Lower Body Dressing: Supervision/safety;Cueing for safety;Cueing for sequencing;Sit to/from stand   Toilet Transfer: Supervision/safety;Rolling walker (2 wheels)   Toileting- Clothing Manipulation and Hygiene: Minimal assistance;Sit to/from stand       Functional mobility during ADLs: Supervision/safety;Rolling walker (2 wheels) General ADL  Comments: in room with hygiene tasks    Extremity/Trunk Assessment Upper  Extremity Assessment Upper Extremity Assessment: Generalized weakness (sternal precautions difficult to fully assess)   Lower Extremity Assessment Lower Extremity Assessment: Defer to PT evaluation        Vision   Vision Assessment?: No apparent visual deficits   Perception Perception Perception: Not tested   Praxis Praxis Praxis: Not tested    Cognition Arousal: Alert Behavior During Therapy: [facility] for tasks assessed/performed Overall Cognitive Status: Within Functional Limits for tasks assessed                                          Exercises      Shoulder Instructions       General Comments      Pertinent Vitals/ Pain       Pain Assessment Pain Assessment: Faces Faces Pain Scale: Hurts a little bit Facial Expression: Relaxed, neutral Body Movements: Absence of movements Muscle Tension: Relaxed Compliance with ventilator (intubated pts.): N/A Vocalization (extubated pts.): N/A CPOT Total: 0 Pain Location: chest soreness Pain Descriptors / Indicators: Operative site guarding Pain Intervention(s): Limited activity within patient's tolerance  Home Living                                          Prior Functioning/Environment              Frequency  Min 1X/week        Progress Toward Goals  OT Goals(current goals can now be found in the care plan section)  Progress towards OT goals: Progressing toward goals  Acute Rehab OT Goals Patient Stated Goal: to go home very soon OT Goal Formulation: With patient Time For Goal Achievement: 07/18/23 Potential to Achieve Goals: Good ADL Goals Pt Will Perform Grooming: with modified independence;standing Pt Will Perform Upper Body Dressing: with modified independence;sitting Pt Will Perform Lower Body Dressing: with modified independence;sit to/from stand Pt Will Transfer to Toilet: with modified independence;regular height toilet;ambulating Pt Will Perform Toileting -  Clothing Manipulation and hygiene: with modified independence;sit to/from stand Additional ADL Goal #1: Pt will perform all ADL with mod I for incresaed time and rest breaks as needed within sternal precautions  Plan      Co-evaluation                 AM-PAC OT "6 Clicks" Daily Activity     Outcome Measure   Help from another person eating meals?: None Help from another person taking care of personal grooming?: A Little Help from another person toileting, which includes using toliet, bedpan, or urinal?: A Little Help from another person bathing (including washing, rinsing, drying)?: A Little Help from another person to put on and taking off regular upper body clothing?: A Little Help from another person to put on and taking off regular lower body clothing?: A Little 6 Click Score: 19    End of Session Equipment Utilized During Treatment: Gait belt;Rolling walker (2 wheels)  OT Visit Diagnosis: Unsteadiness on feet (R26.81);Muscle weakness (generalized) (M62.81);Pain Pain - part of body:  (chest from sx site)   Activity Tolerance Patient tolerated treatment well   Patient Left in bed;with call bell/phone within reach   Nurse Communication Mobility status        Time:  1324-4010 OT Time Calculation (min): 42 min  Charges: OT General Charges $OT Visit: 1 Visit OT Treatments $Self Care/Home Management : 38-52 mins  Presley Raddle OTR/L  Acute Rehab Services  [phone] office number   Alphia Moh 07/11/2023, 10:01 AM

## 2023-07-12 DIAGNOSIS — I059 Rheumatic mitral valve disease, unspecified: Secondary | ICD-10-CM | POA: Diagnosis not present

## 2023-07-12 DIAGNOSIS — I509 Heart failure, unspecified: Secondary | ICD-10-CM | POA: Diagnosis not present

## 2023-07-12 LAB — BASIC METABOLIC PANEL
Anion gap: 12 (ref 5–15)
BUN: 14 mg/dL (ref 6–20)
CO2: 21 mmol/L — ABNORMAL LOW (ref 22–32)
Calcium: 8.6 mg/dL — ABNORMAL LOW (ref 8.9–10.3)
Chloride: 96 mmol/L — ABNORMAL LOW (ref 98–111)
Creatinine, Ser: 0.62 mg/dL (ref 0.61–1.24)
GFR, Estimated: >60 mL/min (ref >60)
Glucose, Bld: 115 mg/dL — ABNORMAL HIGH (ref 70–99)
Potassium: 3.9 mmol/L (ref 3.5–5.1)
Sodium: 129 mmol/L — ABNORMAL LOW (ref 135–145)

## 2023-07-12 LAB — PROTIME-INR
INR: 2.1 — ABNORMAL HIGH (ref 0.8–1.2)
Prothrombin Time: 24.2 s — ABNORMAL HIGH (ref 11.4–15.2)

## 2023-07-12 MED ORDER — WARFARIN SODIUM 5 MG PO TABS
7.5000 mg | ORAL_TABLET | Freq: Once | ORAL | Status: AC
  Administered 2023-07-12: 7.5 mg via ORAL
  Filled 2023-07-12: qty 1

## 2023-07-12 MED ORDER — PENICILLIN G POTASSIUM IV (FOR PTA / DISCHARGE USE ONLY): 24.0000 10*6.[IU] | INTRAVENOUS | 0 refills | Status: AC

## 2023-07-12 NOTE — TOC CM/SW Note (Signed)
Noted pt's INR today at 2.1- goal is 2.5-3.5-  HHPT has been set up with [facility]. Pt will need home IV abx- PCN-G- [facility] following for [facility] and IV abx needs- CM spoke with liaison Santina Evans who has been following ([facility] provided bedside education earlier in week) [phone].  Per [facility] will have their pharmacy go ahead and mix drug for home today for a possible weekend discharge. They can deliver tomorrow- however do not deliver on Sundays.  TOC weekend coverage will need to reach out to [facility] pharmacy at [phone] to confirm that pt needs delivery if pt medically cleared for discharge tomorrow.

## 2023-07-12 NOTE — Progress Notes (Signed)
Physical Therapy Treatment Patient Details Name: Victor [name] MRN: [id number] DOB: 1971-06-14 Today's Date: 07/12/2023   History of Present Illness 52 year old male who was admitted on 11/26 with SOB and LE edema, pt now s/p MVR on 12/10. PMH: hyperlipidemia, obesity, hypertension, and chronic back pain    PT Comments  Pt is slowly progressing towards goals. Improved mobility this session without rest breaks during 300 ft of gait. Continues to require supervision for safety and to maintain sternal precautions. Due to pt current functional status, home set up and available assistance at home no recommended skilled physical therapy services at this time on discharge from acute care hospital setting. Will continue to follow in acute setting in order to ensure that pt returns home with decreased risk for falls, injury, re-hospitalization and improved activity tolerance. Pt may benefit from cardiac rehab on discharge to improve activity tolerance.      If plan is discharge home, recommend the following: A little help with bathing/dressing/bathroom;Help with stairs or ramp for entrance;Assist for transportation;Assistance with cooking/housework;A little help with walking and/or transfers     Equipment Recommendations  Rollator (4 wheels)       Precautions / Restrictions Precautions Precautions: Sternal;Fall Precaution Booklet Issued: No Precaution Comments: reviewed sternal precautions Restrictions Weight Bearing Restrictions Per Provider Order: Yes RUE Weight Bearing Per Provider Order: Non weight bearing LUE Weight Bearing Per Provider Order: Non weight bearing Other Position/Activity Restrictions: sternal precautions     Mobility  Bed Mobility Overal bed mobility: Modified Independent Bed Mobility: Supine to Sit     Supine to sit: Modified independent (Device/Increase time)     General bed mobility comments: Pt left in chair at end of session    Transfers Overall transfer level:  Needs assistance Equipment used: Rolling walker (2 wheels) Transfers: Sit to/from Stand Sit to Stand: Supervision           General transfer comment: Use of hands on knees for sit to stand with rocking for momentum    Ambulation/Gait Ambulation/Gait assistance: Supervision Gait Distance (Feet): 300 Feet Assistive device: Rolling walker (2 wheels) Gait Pattern/deviations: Step-through pattern, Decreased stride length Gait velocity: slowed Gait velocity interpretation: 1.31 - 2.62 ft/sec, indicative of limited community ambulator   General Gait Details: no rest breaks this session        Balance Overall balance assessment: Needs assistance Sitting-balance support: No upper extremity supported Sitting balance-Leahy Scale: Good     Standing balance support: Bilateral upper extremity supported, No upper extremity supported Standing balance-Leahy Scale: Fair Standing balance comment: no overt LOB light UE support on RW    Cognition Arousal: Alert Behavior During Therapy: WFL for tasks assessed/performed Overall Cognitive Status: Within Functional Limits for tasks assessed           General Comments General comments (skin integrity, edema, etc.): Vital signs on room air WNL. 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, get back to work painting PT Goal Formulation: With patient Time For Goal Achievement: 07/17/23 Potential to Achieve Goals: Good    Frequency    Min 1X/week      PT Plan  Continue with current POC        AM-PAC PT "6 Clicks" Mobility   Outcome Measure  Help needed turning from  your back to your side while in a flat bed without using bedrails?: A Little Help needed moving from lying on your back to sitting on the side of a flat bed  without using bedrails?: A Little Help needed moving to and from a bed to a chair (including a wheelchair)?: A Little Help needed standing up from a chair using your arms (e.g., wheelchair or bedside chair)?: A Little Help needed to walk in hospital room?: None Help needed climbing 3-5 steps with a railing? : None 6 Click Score: 20    End of Session Equipment Utilized During Treatment: Gait belt Activity Tolerance: Patient tolerated treatment well Patient left: with call bell/phone within reach;in chair (in bathroom, on pt request will call nursing staff when done. Pt is alert and oriented x 4 and aware to not stand up alone.) Nurse Communication: Mobility status PT Visit Diagnosis: Unsteadiness on feet (R26.81);Difficulty in walking, not elsewhere classified (R26.2);Muscle weakness (generalized) (M62.81);Other abnormalities of gait and mobility (R26.89)     Time: 4098-1191 PT Time Calculation (min) (ACUTE ONLY): 23 min  Charges:    $Therapeutic Activity: 23-37 mins PT General Charges $$ ACUTE PT VISIT: 1 Visit                     Harrel Carina, DPT, CLT  Acute Rehabilitation Services Office: [phone] (Secure chat preferred)    Claudia Desanctis 07/12/2023, 1:21 PM

## 2023-07-12 NOTE — Progress Notes (Addendum)
ANTICOAGULATION CONSULT NOTE  Pharmacy Consult for heparin no bolus + warfarin > warfarin only Indication: s/p mechanical MVR 12/10  Allergies  Allergen Reactions   Iodinated Contrast Media Itching   Gadolinium Derivatives     Patient Measurements: Height: 5\' 7"  (170.2 cm) Weight: 88.1 kg (194 lb 4.8 oz) IBW/kg (Calculated) : 66.1 Heparin Dosing Weight: 83 kg  Vital Signs: Temp: 98.3 F (36.8 C) (12/20 0802) Temp Source: Oral (12/20 0802) BP: 123/69 (12/20 0802) Pulse Rate: 80 (12/20 0802)  Labs: Recent Labs    07/10/23 0500 07/10/23 2035 07/11/23 0438 07/12/23 0620  HGB 7.2* 8.9* 8.7*  --   HCT 22.4* 27.5* 26.8*  --   PLT 205  --  218  --   LABPROT 21.8*  --  22.1* 24.2*  INR 1.9*  --  1.9* 2.1*  CREATININE 0.76  --  0.77 0.62    Estimated Creatinine Clearance: 111.8 mL/min (by C-G formula based on SCr of 0.62 mg/dL).  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 Not on anticoagulation prior to admission. Pharmacy consulted for heparin and warfarin dosing starting POD#2 (12/12).  Of note, currently on fluconazole for esophageal candidiasis (to stop 12/20). Postop amio ppx x5 days was completed 12/16. Both medications reduce warfarin metabolism / enhance anticoagulant effect.  12/20 AM: INR 2.1, subtherapeutic after warfarin 5 mg x1 yesterday. Fluconazole course complete, last dose 12/19. Amiodarone course complete 12/16.  No meal documentation, feeding supplement shake added 12/17. No CBC for today, 12/19 Hgb stable at 8.7, PLT stable at 218, s/p 1 pRBC 12/18. No signs/symptoms of bleeding reported. Patient not currently on heparin bridge with subtherapeutic INR, MD aware.   Now with no significant drug interactions to prevent warfarin  metabolism (increasing INR) and receiving meal supplementation, will increase warfarin dose tonight in anticipation of INR decrease/stabilization in the coming days.   Goal of Therapy:  INR 2.5-3.5 Monitor platelets by anticoagulation protocol: Yes   Plan:  Warfarin 7.5 mg x1 tonight Monitor daily INR and CBC Monitor food intake and drug interactions Monitor for signs/symptoms of bleeding  Stephenie Acres, PharmD PGY1 Pharmacy Resident 07/12/2023 10:49 AM

## 2023-07-12 NOTE — Plan of Care (Signed)

## 2023-07-12 NOTE — Progress Notes (Addendum)
PROGRESS NOTE    Victor [name]  ZOX:[id number] DOB: [birth year] DOA: 06/17/2023 PCP: Claiborne Rigg, NP  52/M w hyperlipidemia, obesity, hypertension EtOH use, liver cirrhosis presented to hospital with exertional shortness of breath, bilateral lower extremity edema and pain for 2 weeks.  Diagnosed with Strepotcoccal Bacteremmia and Mitral Valve Endocarditis, Repeat blood cultures negative,  -11/29 TEE noted subaortic membrane with dynamic outflow tract obstruction due to chordal SAM up to 40 mmHg and mitral valve vegetations with degenerative calcified leaflets and severe mitral regurgitation.>-Cardiothoracic surgery was consulted -12/2 cardiac cath>minimal nonobstructive CAD with elevated V waves and filling pressures as well as a subaortic gradient with increased PVCs suggesting potential hypertrophic cardiomyopathy.   -12/2 cardiac MRI moderate asymmetric basal to mid septal hypertrophy. Suspect LV outflow gradient given narrowed LVOT and turbulence in LVOT. Positive mitral valve cordal SAM or MV SAM, Moderate to severe mitral regurgitation. 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  -12/10 underwent MVR > Dr. 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  -Post operative admitted to the ICU, on mechanical ventilatory support.  12/11: extubated early AM POD 1. Pressors off. Tachypneic with small breaths. +diuretics. Dc swan and a-line   12/14 transferred back to [facility].  12/15 clinically stable, will need outpatient antibiotic therapy, he would like to go to short term rehab.  12/16 pending INR to be therapeutic in order to discharge home, he has no SNF insurance coverage.  -12/18, transfused PRBC, torsemide added   Subjective: Feels well, breathing better, waiting for INR to be  therapeutic, 2.1 today  Assessment and Plan:  Severe mitral valve endocarditis Streptococcal bacteremia.  Sp mitral valve replacement, mechanical prosthesis St Jude.  -continue antibiotics with Penicillin to complete 4 weeks post operative end date 1/7, ID follow-up made.  -PICC line placed -Continue warfarin with heparin bridge, INR is 2.1, discharge when INR 2.5 or above, hopefully tomorrow -Prophylactic amiodarone discontinued -Discharge planning, has had CT surgery and [facility] follow-up on 12/31  Post operative right pleural effusion/ atelectasis.  Continue diuresis and incentive spirometer.   Acute on chronic diastolic CHF (congestive heart failure) (HCC) Hyponatremia -Echo with EF 70-75%, narrow LVOT with peak gradient 48 mmHg, RV preserved, moderate mitral stenosis, moderate aortic stenosis  -TEE with no aortic stenosis, positive SAM of the chordal and subaortic membrane. Severe mitral valve regurgitation with positive vegetation, mild MV stenosis,.  -Has been diuresed with IV Lasix, volume status has improved -Continue metoprolol, losartan and Aldactone -Restarted torsemide, volume status has improved, BMP in a.m.  HTN (hypertension) Stable, meds as above  Severe iron deficiency anemia Chronic iron deficiency anemia worsened by postop blood loss  -No overt bleeding reported, transfused 1 unit PRBC 12/18 -Anemia panel with severe iron deficiency, given IV iron -CBC in a.m.  Esophageal candidiasis (HCC) Continue with fluconazole, plan for 14 days of therapy total.   Type 2 diabetes mellitus  Glucose has been stable, discontinued insulin therapy and check CBG as needed.    DVT prophylaxis: Warfarin Code Status: Full Code Family Communication: None present Di sposition Plan: Home likely 1 to 2 days  Consultants:    Procedures:   Antimicrobials:    Objective: Vitals:   07/11/23 1921 07/11/23 2257 07/12/23 0302 07/12/23 0802  BP: (!) 123/58 (!) 134/58 (!) 117/54  123/69  Pulse: 74 79 71 80  Resp: 19 20 20  20  Temp: 98.4 F (36.9 C) 98.8 F (37.1 C) 98.4 F (36.9 C) 98.3 F (36.8 C)  TempSrc: Oral Oral Oral Oral  SpO2: 100% 93% 96%   Weight:   88.1 kg   Height:        Intake/Output Summary (Last 24 hours) at 07/12/2023 1202 Last data filed at 07/12/2023 1100 Gross per 24 hour  Intake --  Output 4775 ml  Net -4775 ml   Filed Weights   07/10/23 0349 07/11/23 0500 07/12/23 0302  Weight: 90.3 kg 88.5 kg 88.1 kg    Examination:  Pleasant chronically ill male sitting up in bed, AAOx3 HEENT: No JVD CVS: S1-S2, regular rhythm Chest with vertical incision with scabs  Lungs: Decreased breath sounds at the bases Abdomen: Soft, nontender, bowel sounds present  Extremities: No edema     Data Reviewed:   CBC: Recent Labs  Lab 07/07/23 0335 07/08/23 0320 07/08/23 2030 07/09/23 0430 07/10/23 0500 07/10/23 2035 07/11/23 0438  WBC 8.8 8.6  --  9.6 8.9  --  8.9  HGB 7.0* 6.7* 7.7* 7.4* 7.2* 8.9* 8.7*  HCT 21.6* 21.1* 23.7* 22.7* 22.4* 27.5* 26.8*  MCV 91.1 91.7  --  89.4 90.3  --  89.0  PLT 163 176  --  203 205  --  218   Basic Metabolic Panel: Recent Labs  Lab 07/08/23 0320 07/09/23 0430 07/10/23 0500 07/11/23 0438 07/12/23 0620  NA 128* 127* 127* 129* 129*  K 4.5 4.7 4.4 4.0 3.9  CL 96* 94* 96* 97* 96*  CO2 24 24 23 23  21*  GLUCOSE 117* 120* 122* 117* 115*  BUN 15 14 13 12 14   CREATININE 0.79 0.81 0.76 0.77 0.62  CALCIUM 8.5* 8.8* 8.6* 8.7* 8.6*  MG 2.0  --   --   --   --    GFR: Estimated Creatinine Clearance: 111.8 mL/min (by C-G formula based on SCr of 0.62 mg/dL). Liver Function Tests: No results for input(s): "AST", "ALT", "ALKPHOS", "BILITOT", "PROT", "ALBUMIN" in the last 168 hours.  No results for input(s): "LIPASE", "AMYLASE" in the last 168 hours. No results for input(s): "AMMONIA" in the last 168 hours. Coagulation Profile: Recent Labs  Lab 07/08/23 0320 07/09/23 0430 07/10/23 0500 07/11/23 0438  07/12/23 0620  INR 2.0* 1.9* 1.9* 1.9* 2.1*   Cardiac Enzymes: No results for input(s): "CKTOTAL", "CKMB", "CKMBINDEX", "TROPONINI" in the last 168 hours. BNP (last 3 results) No results for input(s): "PROBNP" in the last 8760 hours. HbA1C: No results for input(s): "HGBA1C" in the last 72 hours. CBG: Recent Labs  Lab 07/08/23 2131 07/09/23 0619 07/09/23 1150 07/09/23 1643 07/09/23 2108  GLUCAP 120* 143* 118* 136* 120*   Lipid Profile: No results for input(s): "CHOL", "HDL", "LDLCALC", "TRIG", "CHOLHDL", "LDLDIRECT" in the last 72 hours. Thyroid Function Tests: No results for input(s): "TSH", "T4TOTAL", "FREET4", "T3FREE", "THYROIDAB" in the last 72 hours. Anemia Panel: No results for input(s): "VITAMINB12", "FOLATE", "FERRITIN", "TIBC", "IRON", "RETICCTPCT" in the last 72 hours. Urine analysis:    Component Value Date/Time   COLORURINE YELLOW 06/19/2023 0045   APPEARANCEUR CLEAR 06/19/2023 0045   LABSPEC 1.011 06/19/2023 0045   PHURINE 7.0 06/19/2023 0045   GLUCOSEU NEGATIVE 06/19/2023 0045   HGBUR NEGATIVE 06/19/2023 0045   BILIRUBINUR NEGATIVE 06/19/2023 0045   KETONESUR NEGATIVE 06/19/2023 0045   PROTEINUR NEGATIVE 06/19/2023 0045   UROBILINOGEN 1.0 10/15/2013 1349   NITRITE NEGATIVE 06/19/2023 0045   LEUKOCYTESUR NEGATIVE 06/19/2023 0045   Sepsis Labs: @LABRCNTIP (procalcitonin:4,lacticidven:4)  ) No results  found for this or any previous visit (from the past 240 hours).    Radiology Studies: No results found.   Scheduled Meds:  Chlorhexidine Gluconate Cloth  6 each Topical Daily   docusate sodium  200 mg Oral Daily   feeding supplement (GLUCERNA SHAKE)  237 mL Oral TID BM   ferrous gluconate  324 mg Oral Q breakfast   gabapentin  300 mg Oral TID   losartan  25 mg Oral Daily   metoprolol tartrate  25 mg Oral BID   nicotine  14 mg Transdermal Daily   pantoprazole  40 mg Oral Daily   potassium chloride  20 mEq Oral Daily   rosuvastatin  20 mg Oral  Daily   sodium chloride flush  10-40 mL Intracatheter Q12H   sodium chloride flush  3 mL Intravenous Q12H   spironolactone  12.5 mg Oral Daily   torsemide  20 mg Oral Daily   traZODone  100 mg Oral QHS   warfarin  7.5 mg Oral ONCE-1600   Warfarin - Pharmacist Dosing Inpatient   Does not apply q1600   Continuous Infusions:  penicillin G potassium 12 Million Units in dextrose 5 % 500 mL CONTINUOUS infusion 12 Million Units (07/12/23 1127)     LOS: 24 days    Time spent:    Zannie Cove, MD [facility]   07/12/2023, 12:02 PM

## 2023-07-13 DIAGNOSIS — I38 Endocarditis, valve unspecified: Secondary | ICD-10-CM | POA: Diagnosis not present

## 2023-07-13 DIAGNOSIS — R7881 Bacteremia: Secondary | ICD-10-CM | POA: Diagnosis not present

## 2023-07-13 LAB — BASIC METABOLIC PANEL
Anion gap: 9 (ref 5–15)
BUN: 12 mg/dL (ref 6–20)
CO2: 23 mmol/L (ref 22–32)
Calcium: 8.9 mg/dL (ref 8.9–10.3)
Chloride: 97 mmol/L — ABNORMAL LOW (ref 98–111)
Creatinine, Ser: 0.55 mg/dL — ABNORMAL LOW (ref 0.61–1.24)
GFR, Estimated: >60 mL/min (ref >60)
Glucose, Bld: 120 mg/dL — ABNORMAL HIGH (ref 70–99)
Potassium: 4.3 mmol/L (ref 3.5–5.1)
Sodium: 129 mmol/L — ABNORMAL LOW (ref 135–145)

## 2023-07-13 LAB — PROTIME-INR
INR: 2.2 — ABNORMAL HIGH (ref 0.8–1.2)
Prothrombin Time: 24.9 s — ABNORMAL HIGH (ref 11.4–15.2)

## 2023-07-13 MED ORDER — WARFARIN SODIUM 5 MG PO TABS
7.5000 mg | ORAL_TABLET | Freq: Once | ORAL | Status: AC
  Administered 2023-07-13: 7.5 mg via ORAL
  Filled 2023-07-13: qty 1

## 2023-07-13 NOTE — Progress Notes (Signed)
CARDIAC REHAB PHASE I   PRE:  Rate/Rhythm: 72 SR  BP:  Supine:   Sitting: 122/56  Standing:    SaO2: 97% RA  MODE:  Ambulation: 382 ft   POST:  Rate/Rhythm: 82 SR  BP:  Supine:   Sitting: 126/61  Standing:    SaO2: 96% RA  [id number] Mr. Wery tolerated ambulation well with assist x1 and pushing rolling walker. Increased distance with one standing rest break taken. Vital signs within normal limits. IV intact, call bell within reach.  Artist Pais, MS, ACSM CEP 07/13/23 1247

## 2023-07-13 NOTE — TOC Progression Note (Addendum)
Transition of Care [facility]) - Progression Note    Patient Details  Name: Victor [name] MRN: [id number] Date of Birth: 18-Mar-1971  [facility] CM/SW Contact  Ronny Bacon, RN Phone Number: 07/13/2023, 1:18 PM  Clinical Narrative:   Phone call made to [facility] to today's closing hours. Per Alycia Rossetti, pharmacy closes at 2pm. Provider made aware, expects high chance that patient will be discharged tomorrow. Alycia Rossetti is checking with pharmacist to see if medication was delivered to home today, would it still be good if patient did not go home until Monday. Spoke with patient by phone to confirm if anyone would be available at home to accept delivery, if it was to occur today.  Alycia Rossetti reports that the copay is $4, pharmacy will reach out to patient to discuss.  1425: Per Alycia Rossetti, medication will be delivered to son's house ([address], [place], [place]) tonight. Provider made aware.    Expected Discharge Plan: Skilled Nursing Facility Barriers to Discharge: Continued Medical Work up  Expected Discharge Plan and Services In-house Referral: Clinical Social Work Discharge Planning Services: CM Consult Post Acute Care Choice: Home Health, Durable Medical Equipment Living arrangements for the past 2 months: Single Family Home                 DME Arranged: Walker rolling DME Agency: [employer] Date DME Agency Contacted: 07/12/23 Time DME Agency Contacted: 1100 Representative spoke with at DME Agency: Vaughan Basta HH Arranged: RN, IV Antibiotics HH Agency: [facility], Other - See comment ([facility]) Date [facility] Agency Contacted: 07/08/23 Time HH Agency Contacted: 1413 Representative spoke with at [facility] Agency: Kandee Keen and Santina Evans   Social Determinants of Health (SDOH) Interventions SDOH Screenings   Food Insecurity: Food Insecurity Present (06/18/2023)  Housing: Medium Risk (06/18/2023)  Transportation Needs: Unmet Transportation Needs (06/18/2023)   Utilities: Not At Risk (06/18/2023)  Depression (PHQ2-9): Medium Risk (05/07/2023)  Tobacco Use: High Risk (07/02/2023)    Readmission Risk Interventions     No data to display

## 2023-07-13 NOTE — Plan of Care (Signed)
  Problem: Education: Goal: Knowledge of General Education information will improve Description Including pain rating scale, medication(s)/side effects and non-pharmacologic comfort measures Outcome: Progressing   Problem: Health Behavior/Discharge Planning: Goal: Ability to manage health-related needs will improve Outcome: Progressing   

## 2023-07-13 NOTE — Progress Notes (Signed)
ANTICOAGULATION CONSULT NOTE  Pharmacy Consult for heparin no bolus + warfarin > warfarin only Indication: s/p mechanical MVR 12/10  Allergies  Allergen Reactions   Iodinated Contrast Media Itching   Gadolinium Derivatives     Patient Measurements: Height: 5\' 7"  (170.2 cm) Weight: 86.6 kg (190 lb 14.4 oz) IBW/kg (Calculated) : 66.1 Heparin Dosing Weight: 83 kg  Vital Signs: Temp: 98.5 F (36.9 C) (12/21 0731) Temp Source: Oral (12/21 0731) BP: 132/55 (12/21 0731) Pulse Rate: 83 (12/21 0731)  Labs: Recent Labs    07/10/23 2035 07/11/23 0438 07/12/23 0620 07/13/23 0610 07/13/23 0800  HGB 8.9* 8.7*  --   --   --   HCT 27.5* 26.8*  --   --   --   PLT  --  218  --   --   --   LABPROT  --  22.1* 24.2* 24.9*  --   INR  --  1.9* 2.1* 2.2*  --   CREATININE  --  0.77 0.62  --  0.55*    Estimated Creatinine Clearance: 110.9 mL/min (A) (by C-G formula based on SCr of 0.55 mg/dL (L)).  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 Not on anticoagulation prior to admission. Pharmacy consulted for heparin and warfarin dosing starting POD#2 (12/12). Of note, fluconazole for esophageal candidiasis was completed 12/20. Postop amio ppx x5 days was completed 12/16. Both medications could have been reducing warfarin metabolism / enhancing anticoagulant effect.  INR 2.2, subtherapeutic after warfarin 7.5 mg x1 yesterday. No meal documentation, feeding supplement shake added 12/17. No CBC for today, 12/19 Hgb stable at 8.7, PLT stable at 218, s/p 1 pRBC 12/18. No signs/symptoms of bleeding reported. Patient not currently on heparin bridge with subtherapeutic INR, MD aware.   Goal of Therapy:  INR 2.5-3.5 Monitor platelets by anticoagulation protocol: Yes   Plan:   Warfarin 7.5 mg x1 tonight Monitor daily INR and CBC Monitor food intake and drug interactions Monitor for signs/symptoms of bleeding  Nicole Kindred, PharmD PGY1 Pharmacy Resident 07/13/2023 9:45 AM

## 2023-07-13 NOTE — Progress Notes (Signed)
Patient seen and examined, no changes from my note yesterday, remains medically stable, still awaiting therapeutic INR for mechanical mitral valve -Hopefully DC tomorrow -[facility] and [facility] follow-up on 12/31  Zannie Cove, MD

## 2023-07-14 DIAGNOSIS — I059 Rheumatic mitral valve disease, unspecified: Secondary | ICD-10-CM | POA: Diagnosis not present

## 2023-07-14 DIAGNOSIS — I38 Endocarditis, valve unspecified: Secondary | ICD-10-CM | POA: Diagnosis not present

## 2023-07-14 DIAGNOSIS — I509 Heart failure, unspecified: Secondary | ICD-10-CM | POA: Diagnosis not present

## 2023-07-14 DIAGNOSIS — R7881 Bacteremia: Secondary | ICD-10-CM | POA: Diagnosis not present

## 2023-07-14 LAB — PROTIME-INR
INR: 2.5 — ABNORMAL HIGH (ref 0.8–1.2)
Prothrombin Time: 27.3 s — ABNORMAL HIGH (ref 11.4–15.2)

## 2023-07-14 LAB — CBC
HCT: 28.3 % — ABNORMAL LOW (ref 39.0–52.0)
Hemoglobin: 9.1 g/dL — ABNORMAL LOW (ref 13.0–17.0)
MCH: 28.6 pg (ref 26.0–34.0)
MCHC: 32.2 g/dL (ref 30.0–36.0)
MCV: 89 fL (ref 80.0–100.0)
Platelets: 238 10*3/uL (ref 150–400)
RBC: 3.18 MIL/uL — ABNORMAL LOW (ref 4.22–5.81)
RDW: 14.7 % (ref 11.5–15.5)
WBC: 8.7 10*3/uL (ref 4.0–10.5)
nRBC: 0 % (ref 0.0–0.2)

## 2023-07-14 LAB — BASIC METABOLIC PANEL
Anion gap: 10 (ref 5–15)
BUN: 10 mg/dL (ref 6–20)
CO2: 22 mmol/L (ref 22–32)
Calcium: 8.9 mg/dL (ref 8.9–10.3)
Chloride: 97 mmol/L — ABNORMAL LOW (ref 98–111)
Creatinine, Ser: 0.66 mg/dL (ref 0.61–1.24)
GFR, Estimated: >60 mL/min (ref >60)
Glucose, Bld: 112 mg/dL — ABNORMAL HIGH (ref 70–99)
Potassium: 4.2 mmol/L (ref 3.5–5.1)
Sodium: 129 mmol/L — ABNORMAL LOW (ref 135–145)

## 2023-07-14 MED ORDER — WARFARIN SODIUM 5 MG PO TABS: 7.5000 mg | ORAL_TABLET | Freq: Once | ORAL | Status: DC

## 2023-07-14 MED ORDER — TORSEMIDE 10 MG PO TABS: 10.0000 mg | ORAL_TABLET | Freq: Every day | ORAL | 0 refills | Status: DC

## 2023-07-14 MED ORDER — FERROUS GLUCONATE 324 (38 FE) MG PO TABS: 324.0000 mg | ORAL_TABLET | Freq: Every day | ORAL | 1 refills | Status: AC

## 2023-07-14 MED ORDER — SPIRONOLACTONE 25 MG PO TABS: 12.5000 mg | ORAL_TABLET | Freq: Every day | ORAL | 0 refills | Status: DC

## 2023-07-14 MED ORDER — SENNOSIDES-DOCUSATE SODIUM 8.6-50 MG PO TABS: 1.0000 | ORAL_TABLET | Freq: Every evening | ORAL | 0 refills | Status: AC | PRN

## 2023-07-14 MED ORDER — WARFARIN SODIUM 5 MG PO TABS: ORAL_TABLET | ORAL | 1 refills | Status: AC

## 2023-07-14 MED ORDER — LOSARTAN POTASSIUM 25 MG PO TABS: 25.0000 mg | ORAL_TABLET | Freq: Every day | ORAL | 1 refills | Status: AC

## 2023-07-14 MED ORDER — TRAMADOL HCL 50 MG PO TABS: 50.0000 mg | ORAL_TABLET | Freq: Four times a day (QID) | ORAL | 0 refills | Status: DC | PRN

## 2023-07-14 MED ORDER — TRAZODONE HCL 50 MG PO TABS: 50.0000 mg | ORAL_TABLET | Freq: Every day | ORAL | 0 refills | Status: AC

## 2023-07-14 MED ORDER — METOPROLOL TARTRATE 25 MG PO TABS: 25.0000 mg | ORAL_TABLET | Freq: Two times a day (BID) | ORAL | 1 refills | Status: DC

## 2023-07-14 MED ORDER — GABAPENTIN 300 MG PO CAPS: 300.0000 mg | ORAL_CAPSULE | Freq: Every day | ORAL | 1 refills | Status: AC

## 2023-07-14 NOTE — Progress Notes (Addendum)
ANTICOAGULATION CONSULT NOTE  Pharmacy Consult for heparin no bolus + warfarin > warfarin only Indication: s/p mechanical MVR 12/10  Allergies  Allergen Reactions   Iodinated Contrast Media Itching   Gadolinium Derivatives     Patient Measurements: Height: 5\' 7"  (170.2 cm) Weight: 86.6 kg (191 lb) IBW/kg (Calculated) : 66.1 Heparin Dosing Weight: 83 kg  Vital Signs: Temp: 98.9 F (37.2 C) (12/22 0437) Temp Source: Oral (12/22 0437) BP: 112/61 (12/22 0437) Pulse Rate: 73 (12/22 0437)  Labs: Recent Labs    07/12/23 0620 07/13/23 0610 07/13/23 0800 07/14/23 0330  HGB  --   --   --  9.1*  HCT  --   --   --  28.3*  PLT  --   --   --  238  LABPROT 24.2* 24.9*  --  27.3*  INR 2.1* 2.2*  --  2.5*  CREATININE 0.62  --  0.55* 0.66    Estimated Creatinine Clearance: 110.9 mL/min (by C-G formula based on SCr of 0.66 mg/dL).  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 Not on anticoagulation prior to admission. Pharmacy consulted for heparin and warfarin dosing starting POD#2 (12/12). Of note, fluconazole for esophageal candidiasis was completed 12/20. Postop amio ppx x5 days was completed 12/16. Both medications could have been reducing warfarin metabolism / enhancing anticoagulant effect.  INR 2.5, first INR in therapeutic range after receiving two consecutive dose of warfarin 7.5 mg. Consumed 100% of meals yesterday. CBC is stable with Hgb 9.1, plt WNL. No signs/symptoms of bleeding reported.  Patient to be discharged today. Noted there is an appointment with [facility] already scheduled for tomorrow (12/23) afternoon.  Goal of Therapy:  INR 2.5-3.5 Monitor platelets by anticoagulation protocol: Yes   Plan:  Warfarin 7.5 mg x1  tonight (pt is aware to take at home) Patient to f/u with coumadin clinic tomorrow for INR check and further dosing In case patient does not make it to f/u, went ahead and educated patient to start the following dosing regimen tomorrow (12/23): Warfarin 5mg  daily except 7.5mg  on Monday, Wednesdays, Fridays Monitor daily INR/CBC and s/sx of bleeding if patient remains admittted  Nicole Kindred, PharmD PGY1 Pharmacy Resident 07/14/2023 7:44 AM

## 2023-07-14 NOTE — Discharge Summary (Signed)
Physician Discharge Summary  Victor [name] OAC:[id number] DOB: [birth year] DOA: 06/17/2023  PCP: Claiborne Rigg, NP  Admit date: 06/17/2023 Discharge date: 07/14/2023  Time spent: 45 minutes  Recommendations for Outpatient Follow-up:  Coumadin clinic 12/23 Cardiology, [facility] heart care in 12/31 CT surgery 12/31 Home health services for home IV antibiotic therapy until 1/7, please discontinue PICC line once antibiotic therapy is completed Infectious disease Dr.Comer   Discharge Diagnoses:  Active Problems:   Endocarditis of mitral valve   SP mechanical mitral valve replacement 12/10   Acute on chronic diastolic CHF (congestive heart failure) (HCC)   HTN (hypertension)   Iron deficiency anemia   Esophageal candidiasis (HCC)   Hyponatremia   Type 2 diabetes mellitus with hyperlipidemia [facility])   Discharge Condition: Improved  Diet recommendation: Low-sodium, heart healthy, diabetic  Filed Weights   07/12/23 0302 07/13/23 0316 07/14/23 0437  Weight: 88.1 kg 86.6 kg 86.6 kg    History of present illness:  52/M w hyperlipidemia, obesity, hypertension EtOH use, liver cirrhosis presented to hospital with exertional shortness of breath, bilateral lower extremity edema and pain for 2 weeks.  Diagnosed with Strepotcoccal Bacteremmia and Mitral Valve Endocarditis, Repeat blood cultures negative,  -11/29 TEE noted subaortic membrane with dynamic outflow tract obstruction due to chordal SAM up to 40 mmHg and mitral valve vegetations with degenerative calcified leaflets and severe mitral regurgitation.>-Cardiothoracic surgery was consulted -12/2 cardiac cath>minimal nonobstructive CAD with elevated V waves and filling pressures as well as a subaortic gradient with increased PVCs suggesting potential hypertrophic cardiomyopathy.   -12/2 cardiac MRI moderate asymmetric basal to mid septal hypertrophy. Suspect LV outflow gradient given narrowed LVOT and turbulence in LVOT. Positive mitral valve  cordal SAM or MV SAM, Moderate to severe mitral regurgitation. 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  -12/10 underwent MVR > Dr. 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  -Post operative admitted to the ICU, on mechanical ventilatory support.  12/11: extubated early AM POD 1. Pressors off. Tachypneic with small breaths. +diuretics. Dc swan and a-line   12/14 transferred [facility].  12/15 clinically stable, will need outpatient antibiotic therapy, he would like to go to short term rehab.  12/16 pending INR to be therapeutic in order to discharge home, he has no SNF insurance coverage.  -12/18, transfused PRBC, torsemide added -12/19-21: Awaiting therapeutic INR on warfarin for mechanical mitral valve  Hospital Course:   Severe mitral valve endocarditis Streptococcal bacteremia.  Sp mitral valve replacement, mechanical prosthesis St Jude. 12/10 Dr.Weldner -Followed by infectious disease, cardiology and CT surgery  -PER ID continue antibiotics with IV Penicillin G to complete 4 weeks post operative end date 1/7, ID follow-up made.  -PICC line placed -Was on warfarin with heparin bridge, INR finally therapeutic at 2.5 today, heparin discontinued -Prophylactic amiodarone discontinued -Discharge home in a stable condition, has had CT surgery and [facility] follow-up on 12/31, has a follow-up with Coumadin clinic tomorrow 12/23   Post operative right pleural effusion/ atelectasis.  -Improved   Acute on chronic diastolic CHF (congestive heart failure) (HCC) Hyponatremia -Echo with EF 70-75%, narrow LVOT with peak gradient 48 mmHg, RV preserved, moderate mitral stenosis, moderate aortic stenosis  -TEE with no aortic stenosis, positive SAM of the chordal and subaortic membrane. Severe  mitral valve regurgitation with positive vegetation, mild MV stenosis,.  -Has been diuresed with IV  Lasix, volume status has improved -Continue metoprolol, losartan and Aldactone -Resume low-dose torsemide   HTN (hypertension) Stable, meds as above   Severe iron deficiency anemia Chronic iron deficiency anemia worsened by postop blood loss  -No overt bleeding reported, transfused 1 unit PRBC 12/18 -Anemia panel with severe iron deficiency, given IV iron -Add oral iron at discharge   Esophageal candidiasis (HCC) -Completed fluconazole therapy   Type 2 diabetes mellitus  Glucose has been stable, off insulin therapy, A1c is 5.9  Discharge Exam: Vitals:   07/14/23 0437 07/14/23 0906  BP: 112/61 131/72  Pulse: 73   Resp: 20   Temp: 98.9 F (37.2 C) 98.7 F (37.1 C)  SpO2: 98%     Pleasant chronically ill male sitting up in bed, AAOx3 HEENT: No JVD CVS: S1-S2, regular rhythm Chest with vertical incision with scabs  Lungs: Decreased breath sounds at the bases Abdomen: Soft, nontender, bowel sounds present  Extremities: No edema  Discharge Instructions   Discharge Instructions     [facility] pharmacist to adjust dose for Vancomycin, Aminoglycosides and other anti-infective therapies as requested by physician.   Complete by: As directed    [facility] to provide Cath Flo 2mg    Complete by: As directed    Administer for PICC line occlusion and as ordered by physician for other access device issues.   Amb Referral to Cardiac Rehabilitation   Complete by: As directed    Diagnosis: Valve Replacement   Valve: Mitral   After initial evaluation and assessments completed: Virtual Based Care may be provided alone or in conjunction with Phase 2 Cardiac Rehab based on patient barriers.: Yes   Intensive Cardiac Rehabilitation (ICR) [facility] location only OR Traditional Cardiac Rehabilitation (TCR) *If criteria for ICR are not met will enroll in TCR [facility] only): Yes    Anaphylaxis Kit: Provided to treat any anaphylactic reaction to the medication being provided to the patient if First Dose or when requested by physician   Complete by: As directed    Epinephrine 1mg /ml vial / amp: Administer 0.3mg  (0.19ml) subcutaneously once for moderate to severe anaphylaxis, nurse to call physician and pharmacy when reaction occurs and call 911 if needed for immediate care   Diphenhydramine 50mg /ml IV vial: Administer 25-50mg  IV/IM PRN for first dose reaction, rash, itching, mild reaction, nurse to call physician and pharmacy when reaction occurs   Sodium Chloride 0.9% NS IV: Administer if needed for hypovolemic blood pressure drop or as ordered by physician after call to physician with anaphylactic reaction   Change dressing on IV access line weekly and PRN   Complete by: As directed    Diet - low sodium heart healthy   Complete by: As directed    Discharge wound care:   Complete by: As directed    routine   Flush IV access with Sodium Chloride 0.9% and Heparin 10 units/ml or 100 units/ml   Complete by: As directed    Home infusion instructions - [facility]   Complete by: As directed    Instructions: Flush IV access with Sodium Chloride 0.9% and Heparin 10units/ml or 100units/ml   Change dressing on IV access line: Weekly and PRN   Instructions Cath Flo 2mg : Administer for PICC Line occlusion and as ordered by physician for other access device   [facility] pharmacist to adjust dose for: Vancomycin, Aminoglycosides and other anti-infective therapies as requested by physician   Increase activity slowly   Complete by: As directed  Method of administration may be changed at the discretion of home infusion pharmacist based upon assessment of the patient and/or caregiver's ability to self-administer the medication ordered   Complete by: As directed       Allergies as of 07/14/2023       Reactions   Iodinated Contrast Media Itching    Gadolinium Derivatives         Medication List     STOP taking these medications    amLODipine 10 MG tablet Commonly known as: NORVASC   aspirin EC 81 MG tablet   diclofenac 75 MG EC tablet Commonly known as: VOLTAREN   furosemide 20 MG tablet Commonly known as: LASIX   meloxicam 15 MG tablet Commonly known as: MOBIC   methocarbamol 500 MG tablet Commonly known as: ROBAXIN   predniSONE 50 MG tablet Commonly known as: DELTASONE   tiZANidine 4 MG tablet Commonly known as: Zanaflex       TAKE these medications    acetaminophen 500 MG tablet Commonly known as: TYLENOL Take 1,000 mg by mouth in the morning, at noon, and at bedtime.   ferrous gluconate 324 MG tablet Commonly known as: FERGON Take 1 tablet (324 mg total) by mouth daily with breakfast.   gabapentin 300 MG capsule Commonly known as: NEURONTIN Take 1 capsule (300 mg total) by mouth at bedtime. What changed: when to take this   gemfibrozil 600 MG tablet Commonly known as: LOPID Take 1 tablet (600 mg total) by mouth 2 (two) times daily before a meal.   losartan 25 MG tablet Commonly known as: COZAAR Take 1 tablet (25 mg total) by mouth daily. What changed:  medication strength how much to take   metoprolol tartrate 25 MG tablet Commonly known as: LOPRESSOR Take 1 tablet (25 mg total) by mouth 2 (two) times daily.   penicillin G IVPB Inject 24 Million Units into the vein daily for 27 days. Indication:  Strep salivarius MV IE First Dose: Yes Last Day of Therapy:  07/30/23 Labs - Once weekly:  CBC/D and BMP, Labs - Once weekly: ESR and CRP Method of administration: Elastomeric (Continuous infusion) Method of administration may be changed at the discretion of home infusion pharmacist based upon assessment of the patient and/or caregiver's ability to self-administer the medication ordered.   rosuvastatin 20 MG tablet Commonly known as: CRESTOR Take 1 tablet (20 mg total) by mouth daily.    senna-docusate 8.6-50 MG tablet Commonly known as: Senokot-S Take 1 tablet by mouth at bedtime as needed for mild constipation.   spironolactone 25 MG tablet Commonly known as: ALDACTONE Take 0.5 tablets (12.5 mg total) by mouth daily.   torsemide 10 MG tablet Commonly known as: DEMADEX Take 1 tablet (10 mg total) by mouth daily.   traMADol 50 MG tablet Commonly known as: ULTRAM Take 1 tablet (50 mg total) by mouth every 6 (six) hours as needed for severe pain (pain score 7-10).   traZODone 50 MG tablet Commonly known as: DESYREL Take 1 tablet (50 mg total) by mouth at bedtime.   warfarin 5 MG tablet Commonly known as: COUMADIN Take 5mg  on Tuesday Thursday Saturday and Sunday and 7.5 mg on Monday Wednesday Friday               Durable Medical Equipment  (From admission, onward)           Start     Ordered   07/09/23 1315  For home use only DME 4 wheeled rolling walker  with seat  Once       Question:  Patient needs a walker to treat with the following condition  Answer:  S/P mitral valve replacement   07/09/23 1315   07/05/23 1155  For home use only DME Walker rolling  Once       Question Answer Comment  Walker: With 5 Inch Wheels   Patient needs a walker to treat with the following condition Physical deconditioning   Patient needs a walker to treat with the following condition S/P mitral valve replacement      07/05/23 1154              Discharge Care Instructions  (From admission, onward)           Start     Ordered   07/14/23 0000  Discharge wound care:       Comments: routine   07/14/23 1031   07/12/23 0000  Change dressing on IV access line weekly and PRN  (Home infusion instructions - [facility] )        07/12/23 1502           Allergies  Allergen Reactions   Iodinated Contrast Media Itching   Gadolinium Derivatives     Follow-up Information     [facility] Follow up.   Why: [phone]  for home iv ABX- they  will provide a nurse to do weekly PICC line drsg changes        [facility] [place] [place] Follow up on 07/18/2023.   Specialty: Cardiothoracic Surgery Why: Follow up appointment is at 2:30PM Contact information: [address] [place], [address] [place] [address] [phone]        [facility] Follow up on 07/18/2023.   Why: To get a chest xray at 1:30PM, prior to your appointment Contact information: [address] [place] [address]        Gardiner Barefoot, MD Follow up.   Specialty: Infectious Diseases Why: 07/30/23 at 11:15 am. Please call to rescheudle if you are not able to make this appointment. Contact information: [address] [place] [address] [phone]         Jodelle Gross, NP Follow up on 07/23/2023.   Specialties: Cardiology, Radiology, Cardiology Why: Cardiology follow up is at 2:45PM Contact information: [address] [place] [address] [phone]         [facility] Follow up on 08/15/2023.   Specialty: Cardiology Why: Echocardiogram is at 7:30AM Contact information: [address] [place] [address] [phone]        [facility] Follow up.   Specialty: Home Health Services Why: HHPT arranged- they will contact you to schedule Contact information: [address] [place] [address] [phone]         [facility] Go on 07/15/2023.   Why: Appointment time is at 2:30 pm and is to have PT/INR drawn (on Coumadin for mechanical Mitral Valve Replacment) Contact information: [address] [place], [place] [address] [phone]        [facility] Follow up.   Why: Rolling walker arranged- to be delivered to room prior to discharge Contact information: [address] [place] [address] [phone]                  The results of significant diagnostics from this hospitalization (including imaging, microbiology,  ancillary and laboratory) are listed below for reference.    Significant Diagnostic Studies: DG Chest 2 View Result Date: 07/06/2023 CLINICAL DATA:  Follow-up right pleural effusion EXAM: CHEST - 2 VIEW COMPARISON:  07/05/2023, 07/04/2023, 07/03/2023 FINDINGS: Removal of right IJ sheath. Post sternotomy changes and valve prosthesis. Enlarged cardiomediastinal silhouette. Small right greater than left pleural effusions without significant change. Persistent right basilar airspace disease. IMPRESSION: Small right greater than left pleural effusions with persistent right basilar airspace disease. Cardiomegaly Electronically Signed   By: Jasmine Pang M.D.   On: 07/06/2023 16:48   [place] EKG SITE RITE Result Date: 07/06/2023 If Site Rite image not attached, placement could not be confirmed due to current cardiac rhythm.  DG CHEST PORT 1 VIEW Result Date: 07/05/2023 CLINICAL DATA:  Mitral valve repair EXAM: PORTABLE CHEST 1 VIEW COMPARISON:  None Available. FINDINGS: Sternal wires overlie stable cardiac silhouette. There are low lung volumes. There is bibasilar atelectasis and small effusions no pulmonary edema. IJ sheath remains. IMPRESSION: Low lung volumes with bibasilar atelectasis and small effusions. No interval change. Electronically Signed   By: Genevive Bi M.D.   On: 07/05/2023 10:37   ECHO INTRAOPERATIVE TEE Result Date: 07/05/2023  *INTRAOPERATIVE TRANSESOPHAGEAL REPORT *  Patient Name:   Victor [name]    Date of Exam: 07/02/2023 Medical Rec #:  [id number]      Height:       67.0 in Accession #:    [id number]     Weight:       191.8 lb Date of Birth:  June 21, 1971       BSA:          1.99 m Patient Age:    52 years       BP:           147/70 mmHg Patient Gender: M              HR:           90 bpm. Exam Location:  Anesthesiology Transesophogeal exam was perform intraoperatively during surgical  procedure. Patient was closely monitored under general anesthesia during the entirety of examination. Indications:     I34.8 Other nonrheumatic mitral valve disorders Performing Phys: Mariann Barter MD Diagnosing Phys: Roslynn Amble Complications: No known complications during this procedure. POST-OP IMPRESSIONS Limited echocardiogram performed to aid resuscitation during return to OR for high chest tube output. Overall, there were no significant changes from prior. The RV appears mildly to moderately reduced in function, consistent with usual post-bypass state; epinephrine at 57mcg/min initiated for RV support. PRE-OP FINDINGS  Left Ventricle: The left ventricle has mildly reduced systolic function, with an ejection fraction of 45-50%. The cavity size was normal. There is severe concentric left ventricular hypertrophy. Right Ventricle: The right ventricle has moderately reduced systolic function. The cavity was normal. There is no increase in right ventricular wall thickness. Left Atrium: Left atrial size was not assessed. Right Atrium: Right atrial size was not assessed. Interatrial Septum: No atrial level shunt detected by color flow Doppler. Pericardium: There is no evidence of pericardial effusion. There is no pleural effusion. Mitral Valve: Normally functioning mechanical mitral valve.  Tricuspid Valve: The tricuspid valve was normal in structure. Tricuspid valve regurgitation is moderate by color flow Doppler. No evidence of tricuspid stenosis is present. Aortic Valve: The aortic valve is normal in structure. Aortic valve regurgitation was not visualized by color flow Doppler. There is no stenosis of the aortic valve. Pulmonic Valve: The pulmonic valve was  normal in structure. Pulmonic valve regurgitation is mild by color flow Doppler. Aorta: The aortic root, ascending aorta and aortic arch are normal in size and structure.  Roslynn Amble Electronically signed by Roslynn Amble Signature Date/Time: 07/05/2023/9:44:42 AM     Final    ECHO INTRAOPERATIVE TEE Result Date: 07/05/2023  *INTRAOPERATIVE TRANSESOPHAGEAL REPORT *  Patient Name:   Victor [name]    Date of Exam: 07/02/2023 Medical Rec #:  [id number]      Height:       67.0 in Accession #:    [id number]     Weight:       191.8 lb Date of Birth:  [birth year]       BSA:          1.99 m Patient Age:    52 years       BP:           147/70 mmHg Patient Gender: M              HR:           74 bpm. Exam Location:  Anesthesiology Transesophogeal exam was perform intraoperatively during surgical procedure. Patient was closely monitored under general anesthesia during the entirety of examination. Indications:     I34.0 Nonrheumatic mitral (valve) insufficiency Performing Phys: Mariann Barter MD Diagnosing Phys: Roslynn Amble Complications: No known complications during this procedure. POST-OP IMPRESSIONS _ Left Ventricle: The left ventricle is unchanged from pre-bypass with normal systolic function. The cavity size was decreased. _ Right Ventricle: normal function. _ Aorta: there is no dissection present in the aorta. _ Aortic Valve: The aortic valve appears unchanged from pre-bypass.No significant turbulence noted in the LVOT with color doppler. Mean LVOT gradient of despite underfilled state post-bypass. _ Mitral Valve: Status post mitral valve replacement with a mechanical prosthesis. Normal function of the prosthetic valve with usual washing jets noted. Trasmitral gradient . _ Tricuspid Valve: The tricuspid valve appears unchanged from pre-bypass. There is mild regurgitation. _ Pulmonic Valve: The pulmonic valve appears unchanged from pre-bypass. _ Interatrial Septum: The interatrial septum appears unchanged from pre-bypass. _ Pericardium: The pericardium appears unchanged from pre-bypass. PRE-OP FINDINGS  Left Ventricle: The left ventricle has normal systolic function, with an ejection fraction of 55-60%. The cavity size was normal. There is severely increased left ventricular  wall thickness. There is severe concentric left ventricular hypertrophy. Left ventricular diastolic function could not be evaluated. Hypertrophy of the basal septal wall with associated flow acceleration evident via color doppler, although no significant LVOT gradient could be elicited during this examination under general anesthesia. Right Ventricle: The right ventricle has normal systolic function. The cavity was normal. There is no increase in right ventricular wall thickness. Catheter present in the right ventricle. Left Atrium: Left atrial size was not assessed. No left atrial/left atrial appendage thrombus was detected. Right Atrium: Right atrial size was not assessed. Interatrial Septum: No atrial level shunt detected by color flow Doppler. Pericardium: There is no evidence of pericardial effusion. There is no pleural effusion. Mitral Valve: The mitral valve is rheumatic. Mitral valve regurgitation is severe by color flow Doppler. The MR jet is centrally-directed. There is No evidence of mitral stenosis. Tricuspid Valve: The tricuspid valve was normal in structure. Tricuspid valve regurgitation is mild by color flow Doppler. No evidence of tricuspid stenosis is present. Aortic Valve: The aortic valve is normal in structure. Aortic valve regurgitation was not visualized by color flow Doppler. There is no stenosis of the  aortic valve. Pulmonic Valve: The pulmonic valve was normal in structure No evidence of pumonic stenosis. Pulmonic valve regurgitation is mild by color flow Doppler. Aorta: The aortic root, ascending aorta and aortic arch are normal in size and structure. +---------------+------+-------+ RIGHT VENTRICLE              +---------------+------+-------+ TAPSE (M-mode):2.2 cm2.37 cm +---------------+------+-------+ +------------------+------------++ AORTIC VALVE                   +------------------+------------++ AV Vmax:          243.33 cm/s  +------------------+------------++ AV  Vmean:         146.667 cm/s +------------------+------------++ AV VTI:           0.418 m      +------------------+------------++ AV Peak Grad:     23.7 mmHg    +------------------+------------++ AV Mean Grad:     11.7 mmHg    +------------------+------------++ LVOT Vmax:        207.50 cm/s  +------------------+------------++ LVOT Vmean:       134.250 cm/s +------------------+------------++ LVOT VTI:         0.296 m      +------------------+------------++ LVOT/AV VTI ratio:0.71         +------------------+------------++  +-------------+------+ SHUNTS              +-------------+------+ Systemic VTI:0.30 m +-------------+------+  Roslynn Amble Electronically signed by Roslynn Amble Signature Date/Time: 07/05/2023/9:38:37 AM    Final    DG CHEST PORT 1 VIEW Result Date: 07/04/2023 CLINICAL DATA:  52 year old male status post chest tube placement. EXAM: PORTABLE CHEST 1 VIEW COMPARISON:  Chest x-ray 07/03/2023. FINDINGS: Previously noted Swan-Ganz catheter has been removed. Right IJ Cordis remains in position with tip projecting over the mid superior vena cava. Right-sided chest tube in position with tip projecting over the lower right hemithorax. There is also likely mediastinal/pericardial drain. Lung volumes are very low. Bibasilar opacities (right greater than left) favored to reflect areas of postoperative subsegmental atelectasis, with superimposed small right pleural effusion. No definite left pleural effusion. No appreciable pneumothorax. No evidence of pulmonary edema. Cardiomediastinal contours are within normal limits in this postoperative patient. Patient is status post median sternotomy for mitral valve replacement. IMPRESSION: 1. Postoperative changes and support apparatus, as above. 2. Low lung volumes with probable postoperative atelectasis in the lung bases bilaterally (right greater than left) and small right pleural effusion. Electronically Signed   By: Trudie Reed M.D.   On: 07/04/2023 06:12   DG Chest Port 1 View Result Date: 07/03/2023 CLINICAL DATA:  [id number] S/P mitral valve replacement [id number] EXAM: PORTABLE CHEST 1 VIEW COMPARISON:  07/02/2023 FINDINGS: Endotracheal tube and enteric tube have been removed. Right IJ pulmonary arterial catheter remains in place with distal tip projecting over the central pulmonary outflow. Mediastinal drain and right basilar chest tube are in place. Stable heart size status post sternotomy and mitral valve replacement. Low lung volumes with increasing right basilar atelectasis. No appreciable pleural fluid collection. No appreciable pneumothorax. IMPRESSION: 1. No appreciable pneumothorax. 2. Low lung volumes with increasing right basilar atelectasis. 3. Interval removal of endotracheal tube and enteric tube. Electronically Signed   By: Duanne Guess D.O.   On: 07/03/2023 09:44   DG Chest Port 1 View Result Date: 07/02/2023 CLINICAL DATA:  Mitral valve replacement EXAM: PORTABLE CHEST 1 VIEW COMPARISON:  06/22/2023 FINDINGS: Single frontal view of the chest demonstrates endotracheal tube overlying tracheal air column tip midway between thoracic inlet and carina. Enteric catheter  passes below diaphragm, tip excluded by collimation. Mediastinal drains overlie the cardiac silhouette. Flow directed right internal jugular central venous catheter tip overlies main pulmonary outflow tract. Median sternotomy wires and mitral valve prosthesis are noted. Cardiac silhouette is mildly enlarged. Increased central vascular congestion, with streaky consolidation at the lung bases right greater than left likely atelectasis. No evidence of pleural effusion. There is a trace left apical pneumothorax, volume estimated far less than 5%. IMPRESSION: 1. 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 2. Support devices as above. 3. Trace left apical pneumothorax, volume estimated far less than 5%. 4. Pulmonary vascular congestion, with  streaky bibasilar atelectasis right greater than left. Critical Value/emergent results were called by telephone at the time of interpretation on 07/02/2023 at 2:43 pm to DR [facility], ELISA, who verbally acknowledged these results. Electronically Signed   By: Sharlet Salina M.D.   On: 07/02/2023 14:56   DG Abd 1 View Result Date: 07/02/2023 CLINICAL DATA:  Mitral valve replacement EXAM: ABDOMEN - 1 VIEW COMPARISON:  None Available. FINDINGS: Frontal view of the lower chest and upper abdomen demonstrates enteric catheter passing below diaphragm, tip and side port projecting over the gastric fundus. Flow directed central venous catheter tip overlies main pulmonary outflow tract. Mediastinal and pericardial drains are identified. Epicardial pacing wires are noted along the right heart border. Pulmonary vascular congestion, with linear right basilar consolidation compatible with atelectasis. IMPRESSION: 1. Enteric catheter tip projecting over gastric fundus. 2. Other support devices as above. 3. Pulmonary vascular congestion with right basilar atelectasis. Electronically Signed   By: Sharlet Salina M.D.   On: 07/02/2023 14:42   US Abdomen Limited RUQ (LIVER/GB) Result Date: 06/27/2023 CLINICAL DATA:  Elevated LFTs. EXAM: ULTRASOUND ABDOMEN LIMITED RIGHT UPPER QUADRANT COMPARISON:  None Available. FINDINGS: Gallbladder: No gallstones or wall thickening visualized. No sonographic Murphy sign noted by sonographer. Common bile duct: Diameter: 3-4 mm Liver: Heterogeneous coarsened hepatic echotexture with nodular contour. Portal vein is patent on color Doppler imaging with normal direction of blood flow towards the liver. Other: None. IMPRESSION: 1. Heterogeneous coarsened hepatic echotexture with nodular contour. Imaging features suggest cirrhosis. 2. No evidence for cholelithiasis or biliary ductal dilatation. Electronically Signed   By: Kennith Center M.D.   On: 06/27/2023 06:15   VAS US CAROTID Result  Date: 06/25/2023 Carotid Arterial Duplex Study Patient Name:  Victor [name]  Date of Exam:   06/25/2023 Medical Rec #: [id number]    Accession #:    [id number] Date of Birth: 1971-01-10     Patient Gender: M Patient Age:   39 years Exam Location:  [facility] Procedure:      VAS US CAROTID Referring Phys: Cristal Deer END --------------------------------------------------------------------------------  Indications:       Pre-op, severe mitral regurgitation. Risk Factors:      Hypertension, hyperlipidemia, current smoker. Other Factors:     Obesity. Comparison Study:  No prior exam. Performing Technologist: Fernande Bras  Examination Guidelines: A complete evaluation includes B-mode imaging, spectral Doppler, color Doppler, and power Doppler as needed of all accessible portions of each vessel. Bilateral testing is considered an integral part of a complete examination. Limited examinations for reoccurring indications may be performed as noted.  Right Carotid Findings: +----------+--------+--------+--------+------------------+--------+           PSV cm/sEDV cm/sStenosisPlaque DescriptionComments +----------+--------+--------+--------+------------------+--------+ CCA Prox  131     22                                         +----------+--------+--------+--------+------------------+--------+  CCA Distal96      20                                         +----------+--------+--------+--------+------------------+--------+ ICA Prox  72      25              heterogenous               +----------+--------+--------+--------+------------------+--------+ ICA Mid   105     43                                         +----------+--------+--------+--------+------------------+--------+ ICA Distal100     37                                         +----------+--------+--------+--------+------------------+--------+ ECA       103     11                                          +----------+--------+--------+--------+------------------+--------+ +----------+--------+-------+--------+-------------------+           PSV cm/sEDV cmsDescribeArm Pressure (mmHG) +----------+--------+-------+--------+-------------------+ [id number]     31                                 +----------+--------+-------+--------+-------------------+ +---------+--------+--+--------+-+ VertebralPSV cm/s38EDV cm/s8 +---------+--------+--+--------+-+  Left Carotid Findings: +----------+--------+--------+--------+------------------+--------+           PSV cm/sEDV cm/sStenosisPlaque DescriptionComments +----------+--------+--------+--------+------------------+--------+ CCA Prox  111     22                                         +----------+--------+--------+--------+------------------+--------+ CCA Distal100     20                                         +----------+--------+--------+--------+------------------+--------+ ICA Prox  85      22              smooth                     +----------+--------+--------+--------+------------------+--------+ ICA Mid   96      35                                         +----------+--------+--------+--------+------------------+--------+ ICA Distal84      26                                         +----------+--------+--------+--------+------------------+--------+ ECA       99      13                                         +----------+--------+--------+--------+------------------+--------+ +----------+--------+--------+--------+-------------------+  PSV cm/sEDV cm/sDescribeArm Pressure (mmHG) +----------+--------+--------+--------+-------------------+ Subclavian165     27                                  +----------+--------+--------+--------+-------------------+ +---------+--------+--+--------+--+ VertebralPSV cm/s57EDV cm/s18 +---------+--------+--+--------+--+   Summary: Right Carotid:  Velocities in the right ICA are consistent with a 1-39% stenosis.                The ECA appears <50% stenosed. Left Carotid: Velocities in the left ICA are consistent with a 1-39% stenosis.               The ECA appears <50% stenosed. Vertebrals:  Bilateral vertebral arteries demonstrate antegrade flow. Subclavians: Normal flow hemodynamics were seen in bilateral subclavian              arteries. *See table(s) above for measurements and observations.  Electronically signed by Lemar Livings MD on 06/25/2023 at 5:17:02 PM.    Final    MR CARDIAC VELOCITY FLOW MAP Result Date: 06/24/2023 CLINICAL DATA:  Hypertrophic cardiomyopathy EXAM: CARDIAC MRI TECHNIQUE: The patient was scanned on a 1.5 Tesla GE magnet. A dedicated cardiac coil was used. Functional imaging was done using Fiesta sequences. 2,3, and 4 chamber views were done to assess for RWMA's. Modified Simpson's rule using a short axis stack was used to calculate an ejection fraction on a dedicated work [employer]. The patient received 10 cc of Gadavist. After 10 minutes inversion recovery sequences were used to assess for infiltration and scar tissue. FINDINGS: Limited images of the lung fields showed trivial bilateral pleural effusions. Small circumferential pericardial effusion. Normal left ventricular size with vigorous systolic function, EF 69%. No wall motion abnormalities. There was moderate asymmetric basal to mid septal hypertrophy (1.8 cm basal septum, 0.8 cm basal inferolateral wall). Narrowed LV outflow tract with turbulence. There is thickening of the mitral valve leaflets with either chordal systolic anterior motion or valvular systolic anterior motion, significant respiratory artifact on this study makes it difficult to distinguish. I cannot see a subaortic membrane but temporal resolution of this study is poor. Normal right ventricular size and systolic function, EF 56%. Moderate left atrial enlargement. Normal right atrium.  Trileaflet aortic valve, The valve appears at most mildly restricted, there is trivial aortic regurgitation with regurgitant fraction 8%. Mitral regurgitant fraction 39% (>40% severe) with regurgitant volume 54 cc (>50 cc severe), this suggests moderate-severe mitral regurgitation. On delayed enhancement imaging, there is patchy mid-wall LGE in the basal septum, with mid-wall LGE becoming more confluent in the mid septum. Confluent mid-wall LGE in the mid-anterior, mid-anterolateral, and mid-inferolateral walls. MEASUREMENTS: MEASUREMENTS LVEDV 197 mL LVEDVi 99 mL/m2 LVSV 137 mL LVEF 69% RVEDV 179 mL RVEDVi 90 mL/m2 RVSV 100 mL RVEF 56% Aortic forward volume 83 mL Aortic regurgitant fraction 8% T1 1104, ECV 39% IMPRESSION: 1.  Small pericardial effusion. 2. Vigorous LV systolic function, EF 69%. There was moderate asymmetric basal to mid septal hypertrophy. Suspect LV outflow gradient given narrowed LVOT and turbulence in LVOT. Difficult study with poor images due to respiratory artifact, unable to comment on subaortic membrane presence. 3.  Normal RV size and systolic function, EF 56%. 4. The mitral valve leaflets appear thickened. Images not sufficient to comment on presence or absence of vegetation. There is either chordal SAM or MV SAM (images not sufficient to distinguish). There is moderate-severe mitral regurgitation with mitral regurgitant fraction 39% and regurgitant volume 54  cc. 5. Diffuse non-coronary LGE pattern. This could be consistent with hypertrophic cardiomyopathy (or another infiltrative disease). 6. Elevated extracellular volume percentage at 39%. This suggests increased myocardial fibrotic content. Dalton Mclean Electronically Signed   By: Marca Ancona M.D.   On: 06/24/2023 17:18   MR CARDIAC VELOCITY FLOW MAP Result Date: 06/24/2023 CLINICAL DATA:  Hypertrophic cardiomyopathy EXAM: CARDIAC MRI TECHNIQUE: The patient was scanned on a 1.5 Tesla GE magnet. A dedicated cardiac coil was used.  Functional imaging was done using Fiesta sequences. 2,3, and 4 chamber views were done to assess for RWMA's. Modified Simpson's rule using a short axis stack was used to calculate an ejection fraction on a dedicated work [employer]. The patient received 10 cc of Gadavist. After 10 minutes inversion recovery sequences were used to assess for infiltration and scar tissue. FINDINGS: Limited images of the lung fields showed trivial bilateral pleural effusions. Small circumferential pericardial effusion. Normal left ventricular size with vigorous systolic function, EF 69%. No wall motion abnormalities. There was moderate asymmetric basal to mid septal hypertrophy (1.8 cm basal septum, 0.8 cm basal inferolateral wall). Narrowed LV outflow tract with turbulence. There is thickening of the mitral valve leaflets with either chordal systolic anterior motion or valvular systolic anterior motion, significant respiratory artifact on this study makes it difficult to distinguish. I cannot see a subaortic membrane but temporal resolution of this study is poor. Normal right ventricular size and systolic function, EF 56%. Moderate left atrial enlargement. Normal right atrium. Trileaflet aortic valve, The valve appears at most mildly restricted, there is trivial aortic regurgitation with regurgitant fraction 8%. Mitral regurgitant fraction 39% (>40% severe) with regurgitant volume 54 cc (>50 cc severe), this suggests moderate-severe mitral regurgitation. On delayed enhancement imaging, there is patchy mid-wall LGE in the basal septum, with mid-wall LGE becoming more confluent in the mid septum. Confluent mid-wall LGE in the mid-anterior, mid-anterolateral, and mid-inferolateral walls. MEASUREMENTS: MEASUREMENTS LVEDV 197 mL LVEDVi 99 mL/m2 LVSV 137 mL LVEF 69% RVEDV 179 mL RVEDVi 90 mL/m2 RVSV 100 mL RVEF 56% Aortic forward volume 83 mL Aortic regurgitant fraction 8% T1 1104, ECV 39% IMPRESSION: 1.  Small  pericardial effusion. 2. Vigorous LV systolic function, EF 69%. There was moderate asymmetric basal to mid septal hypertrophy. Suspect LV outflow gradient given narrowed LVOT and turbulence in LVOT. Difficult study with poor images due to respiratory artifact, unable to comment on subaortic membrane presence. 3.  Normal RV size and systolic function, EF 56%. 4. The mitral valve leaflets appear thickened. Images not sufficient to comment on presence or absence of vegetation. There is either chordal SAM or MV SAM (images not sufficient to distinguish). There is moderate-severe mitral regurgitation with mitral regurgitant fraction 39% and regurgitant volume 54 cc. 5. Diffuse non-coronary LGE pattern. This could be consistent with hypertrophic cardiomyopathy (or another infiltrative disease). 6. Elevated extracellular volume percentage at 39%. This suggests increased myocardial fibrotic content. Dalton Mclean Electronically Signed   By: Marca Ancona M.D.   On: 06/24/2023 17:18   MR CARDIAC MORPHOLOGY W WO CONTRAST Result Date: 06/24/2023 CLINICAL DATA:  Hypertrophic cardiomyopathy EXAM: CARDIAC MRI TECHNIQUE: The patient was scanned on a 1.5 Tesla GE magnet. A dedicated cardiac coil was used. Functional imaging was done using Fiesta sequences. 2,3, and 4 chamber views were done to assess for RWMA's. Modified Simpson's rule using a short axis stack was used to calculate an ejection fraction on a dedicated work [employer]. The patient received 10 cc  of Gadavist. After 10 minutes inversion recovery sequences were used to assess for infiltration and scar tissue. FINDINGS: Limited images of the lung fields showed trivial bilateral pleural effusions. Small circumferential pericardial effusion. Normal left ventricular size with vigorous systolic function, EF 69%. No wall motion abnormalities. There was moderate asymmetric basal to mid septal hypertrophy (1.8 cm basal septum, 0.8 cm basal inferolateral  wall). Narrowed LV outflow tract with turbulence. There is thickening of the mitral valve leaflets with either chordal systolic anterior motion or valvular systolic anterior motion, significant respiratory artifact on this study makes it difficult to distinguish. I cannot see a subaortic membrane but temporal resolution of this study is poor. Normal right ventricular size and systolic function, EF 56%. Moderate left atrial enlargement. Normal right atrium. Trileaflet aortic valve, The valve appears at most mildly restricted, there is trivial aortic regurgitation with regurgitant fraction 8%. Mitral regurgitant fraction 39% (>40% severe) with regurgitant volume 54 cc (>50 cc severe), this suggests moderate-severe mitral regurgitation. On delayed enhancement imaging, there is patchy mid-wall LGE in the basal septum, with mid-wall LGE becoming more confluent in the mid septum. Confluent mid-wall LGE in the mid-anterior, mid-anterolateral, and mid-inferolateral walls. MEASUREMENTS: MEASUREMENTS LVEDV 197 mL LVEDVi 99 mL/m2 LVSV 137 mL LVEF 69% RVEDV 179 mL RVEDVi 90 mL/m2 RVSV 100 mL RVEF 56% Aortic forward volume 83 mL Aortic regurgitant fraction 8% T1 1104, ECV 39% IMPRESSION: 1.  Small pericardial effusion. 2. Vigorous LV systolic function, EF 69%. There was moderate asymmetric basal to mid septal hypertrophy. Suspect LV outflow gradient given narrowed LVOT and turbulence in LVOT. Difficult study with poor images due to respiratory artifact, unable to comment on subaortic membrane presence. 3.  Normal RV size and systolic function, EF 56%. 4. The mitral valve leaflets appear thickened. Images not sufficient to comment on presence or absence of vegetation. There is either chordal SAM or MV SAM (images not sufficient to distinguish). There is moderate-severe mitral regurgitation with mitral regurgitant fraction 39% and regurgitant volume 54 cc. 5. Diffuse non-coronary LGE pattern. This could be consistent with  hypertrophic cardiomyopathy (or another infiltrative disease). 6. Elevated extracellular volume percentage at 39%. This suggests increased myocardial fibrotic content. Dalton Mclean Electronically Signed   By: Marca Ancona M.D.   On: 06/24/2023 17:18   CARDIAC CATHETERIZATION Result Date: 06/24/2023 Conclusions: Nonobstructive coronary artery disease, as detailed below, including sequential 20% and 20-30% proximal/mid LAD stenoses, 50-60% ostial D1 lesion, and mild plaquing of OM1 and mid RCA. Severely elevated left heart and pulmonary artery pressures (PCWP 34 mmHg, LVEDP 40 mmHg, mean PA 46 mmHg). Mildly elevated right heart filling pressures (mean RA 7 mmHg, RVEDP 9 mmHg). Normal Fick cardiac output/index (CO 6.4 L/min, CI 3.3 L/min/m^2). 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 Suspected acute allergic reaction to IV contrast treated with IV diphenhydramine and methylprednisolone. Recommendations: Escalate diuresis, as tolerated. 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 Yvonne Kendall, MD [facility]  [place] EKG SITE RITE Result Date: 06/23/2023 If Site Rite image not attached, placement could not be confirmed due to current cardiac rhythm.  DG Chest 2 View Result Date: 06/22/2023 CLINICAL DATA:  [phone] with CHF. EXAM: CHEST - 2 VIEW COMPARISON:  Portable chest 06/20/2023 at 5:13 a.m. FINDINGS: PA Lat at 5:13 a.m. The cardiac size, vascular pattern and mediastinal configuration  are normal. The lungs clear. Thoracic cage is intact with slight lower thoracic levoscoliosis. Compare: Unchanged. IMPRESSION: No active cardiopulmonary disease. Electronically Signed   By: Almira Bar M.D.   On: 06/22/2023 05:36   ECHO TEE Result  Date: 06/21/2023    TRANSESOPHOGEAL ECHO REPORT   Patient Name:   Victor [name] Date of Exam: 06/21/2023 Medical Rec #:  [id number]   Height:       67.0 in Accession #:    [id number]  Weight:       190.1 lb Date of Birth:  [birth year]    BSA:          1.979 m Patient Age:    52 years    BP:           115/69 mmHg Patient Gender: M           HR:           83 bpm. Exam Location:  Inpatient Procedure: Transesophageal Echo, Cardiac Doppler, Color Doppler, 3D Echo and            Saline Contrast Bubble Study Indications:     Endocarditis  History:         Patient has prior history of Echocardiogram examinations, most                  recent 06/18/2023. Mitral Valve Disease,                  Signs/Symptoms:Shortness of Breath; Risk Factors:Hypertension.  Sonographer:     Lucendia Herrlich RCS Referring Phys:  [id number] Ronnald Ramp O'NEAL Diagnosing Phys: Lennie Odor MD PROCEDURE: After discussion of the risks and benefits of a TEE, an informed consent was obtained from the patient. TEE procedure time was 30 minutes. The transesophogeal probe was passed without difficulty through the esophogus of the patient. Imaged were obtained with the patient in a left lateral decubitus position. Sedation performed by different physician. The patient was monitored while under deep sedation. Anesthestetic sedation was provided intravenously by Anesthesiology: 700mg  of Propofol. Image quality was excellent. The patient's vital signs; including heart rate, blood pressure, and oxygen saturation; remained stable throughout the procedure. The patient developed no complications during the procedure.  IMPRESSIONS  1. Aortic valve is trileaflet without evidence of stenosis. 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 MG 19.4 mmHG. Vmax 3.1 m/s. Peak gradient 38.1 mmHG. There is SAM of the subchordal apparatus which has calcifications. Suspect this explains the dynamic obstruction which is related to [facility] of the chordal calcification and a  subaortic membrane. The aortic valve is tricuspid. Aortic valve regurgitation is not visualized. No aortic stenosis is present.  2. Degnerative mitral valve with moderately calcified anterior and posterior mitral valve leaflets. There is a vegetation on the PMVL that measures 0.8 cm x 0.6 cm. There is severe mitral regurgitation due to incomplete leaflet coaptation, but cannot exclude a posterior mitral valve cleft. There is also chordal calcifications with SAM and dynamic outflow obstruction associated with a subaortic membrane as described. MVA 2.89 cm2. MG 4 mmHG @ 86 bpm. The mitral valve is degenerative. Severe mitral valve regurgitation. Mild mitral stenosis. The mean mitral valve gradient is 4.0 mmHg with average heart rate of 86 bpm.  3. Left ventricular ejection fraction, by estimation, is 65 to 70%. The left ventricle has normal function. There is moderate asymmetric left ventricular hypertrophy of the basal-septal segment.  4. Right ventricular systolic function is normal. The right ventricular  size is normal.  5. No left atrial/left atrial appendage thrombus was detected. The LAA emptying velocity was 83 cm/s.  6. Agitated saline contrast bubble study was negative, with no evidence of any interatrial shunt. Conclusion(s)/Recommendation(s): Findings are concerning for vegetation/infective endocarditis as detailed above. FINDINGS  Left Ventricle: Left ventricular ejection fraction, by estimation, is 65 to 70%. The left ventricle has normal function. The left ventricular internal cavity size was normal in size. There is moderate asymmetric left ventricular hypertrophy of the basal-septal segment. Right Ventricle: The right ventricular size is normal. No increase in right ventricular wall thickness. Right ventricular systolic function is normal. Left Atrium: Left atrial size was normal in size. No left atrial/left atrial appendage thrombus was detected. The LAA emptying velocity was 83 cm/s. Right Atrium:  Right atrial size was normal in size. Pericardium: There is no evidence of pericardial effusion. Mitral Valve: Degnerative mitral valve with moderately calcified anterior and posterior mitral valve leaflets. There is a vegetation on the PMVL that measures 0.8 cm x 0.6 cm. There is severe mitral regurgitation due to incomplete leaflet coaptation, but  cannot exclude a posterior mitral valve cleft. There is also chordal calcifications with SAM and dynamic outflow obstruction associated with a subaortic membrane as described. MVA 2.89 cm2. MG 4 mmHG @ 86 bpm. The mitral valve is degenerative in appearance. There is moderate calcification of the anterior and posterior mitral valve leaflet(s). Severe mitral valve regurgitation. Mild mitral valve stenosis. MV peak gradient, 6.5 mmHg. The mean mitral valve gradient is 4.0 mmHg with average heart rate of 86 bpm. Tricuspid Valve: The tricuspid valve is grossly normal. Tricuspid valve regurgitation is trivial. No evidence of tricuspid stenosis. Aortic Valve: Aortic valve is trileaflet without evidence of stenosis. 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 MG 19.4 mmHG. Vmax 3.1 m/s. Peak gradient 38.1 mmHG. There is SAM of the subchordal apparatus which has calcifications. Suspect this explains the dynamic obstruction which is related to [facility] of the chordal calcification and a subaortic membrane. The aortic valve is tricuspid. Aortic valve regurgitation is not visualized. No aortic stenosis is present. Aortic valve mean gradient measures 19.4 mmHg. Aortic valve peak gradient measures 38.1 mmHg. Pulmonic Valve: The pulmonic valve was grossly normal. Pulmonic valve regurgitation is not visualized. No evidence of pulmonic stenosis. Aorta: The aortic root and ascending aorta are structurally normal, with no evidence of dilitation. IAS/Shunts: The atrial septum is grossly normal. Agitated saline contrast was given intravenously to evaluate for intracardiac shunting.  Agitated saline contrast bubble study was negative, with no evidence of any interatrial shunt. Additional Comments: Spectral Doppler performed. AORTIC VALVE AV Vmax:      308.52 cm/s AV Vmean:     205.507 cm/s AV VTI:       0.529 m AV Peak Grad: 38.1 mmHg AV Mean Grad: 19.4 mmHg  AORTA Ao Root diam: 3.10 cm Ao Asc diam:  3.00 cm MITRAL VALVE MV Peak grad: 6.5 mmHg MV Mean grad: 4.0 mmHg MV Vmax:      1.27 m/s MV Vmean:     91.4 cm/s Lennie Odor MD Electronically signed by Lennie Odor MD Signature Date/Time: 06/21/2023/3:51:51 PM    Final    EP STUDY Result Date: 06/21/2023 See surgical note for result.  MR LUMBAR SPINE WO CONTRAST Result Date: 06/21/2023 CLINICAL DATA:  Low back pain with symptoms persisting over 6 weeks of treatment. EXAM: MRI LUMBAR SPINE WITHOUT CONTRAST TECHNIQUE: Multiplanar, multisequence MR imaging of the lumbar spine was  performed. No intravenous contrast was administered. COMPARISON:  None Available. FINDINGS: Segmentation:  Standard. Alignment:  Mild retrolisthesis at L5-S1. Vertebrae: No fracture or bone lesion. Edematous signal on both sides of the L5-S1 interspace where there is disc collapse and endplate degeneration, usually degenerative. Associated disc space T2 hyperintensity is minimal. No perispinal collection or edema seen. Conus medullaris and cauda equina: Conus extends to the L1 level. Conus and cauda equina appear normal. Paraspinal and other soft tissues: No perispinal mass or inflammation. Disc levels: L4-5 moderate degenerative facet spurring with mild disc bulging. L5-S1 degenerative disc collapse with endplate degeneration and ridging. Mild bilateral facet spurring. There is crowding of the subarticular recesses without S1 compression. Mild bilateral foraminal narrowing. IMPRESSION: 1. Endplate edema at R4-Y7, usually degenerative but in the setting of bacteremia infection is also possible. No evidence of collection. 2. Lower lumbar degeneration with mild  bilateral foraminal and subarticular recess stenosis at L5-S1. Electronically Signed   By: Tiburcio Pea M.D.   On: 06/21/2023 08:49   MR LUMBAR SPINE W CONTRAST Result Date: 06/20/2023 CLINICAL DATA:  Initial evaluation for low back pain, evaluate for infection. EXAM: MRI LUMBAR SPINE WITH CONTRAST TECHNIQUE: Multiplanar and multiecho pulse sequences of the lumbar spine were obtained with intravenous contrast. CONTRAST:  9mL GADAVIST GADOBUTROL 1 MMOL/ML IV SOLN COMPARISON:  Prior study from 06/17/2023. FINDINGS: Segmentation: Standard. Lowest well-formed disc space labeled the L5-S1 level. Alignment: 6 mm degenerative retrolisthesis of L5 on S1. Alignment otherwise normal preservation of the normal lumbar lordosis. Vertebrae: Vertebral body height maintained without acute or chronic fracture. Bone marrow signal intensity heterogeneous and decreased on T1 weighted imaging, nonspecific, but most commonly related to anemia, smoking or obesity. No worrisome or aggressive osseous lesions. Reactive endplate change with endplate enhancement seen about the L5-S1 interspace. No significant surrounding paraspinous inflammatory changes. No fluid signal intensity within the intervening L5-S1 disc space on prior MRI. Findings are favored to be degenerative in nature. Possible early changes of infection are difficult to exclude. No other evidence for acute or active infection elsewhere within the lumbar spine. No other abnormal enhancement. Conus medullaris and cauda equina: Conus extends to the L1 level. Conus and cauda equina appear normal. Paraspinal and other soft tissues: Paraspinous soft tissues demonstrate no acute finding. Disc levels: L1-2:  Normal interspace.  Mild facet hypertrophy.  No stenosis. L2-3: Normal interspace. Mild facet hypertrophy. No significant stenosis. L3-4: Mild far lateral disc bulging. Mild bilateral facet hypertrophy. No spinal stenosis. Foramina remain patent. L4-5: Mild disc bulge.  Moderate bilateral facet hypertrophy. No spinal stenosis. Foramina remain patent. L5-S1: Advanced degenerative intervertebral disc space narrowing with retrolisthesis. Diffuse disc bulge with reactive endplate spurring. Resultant posterior disc osteophyte complex closely approximates the descending S1 nerve roots without frank impingement or displacement. Mild facet hypertrophy. No significant spinal stenosis. Mild bilateral L5 foraminal stenosis. IMPRESSION: 1. Reactive endplate change with endplate enhancement about the L5-S1 interspace, favored to be degenerative in nature, although possible changes of infection are difficult to exclude, and could be considered in the correct clinical setting. If the clinical picture is equivocal for spinal infection, a short interval follow-up exam to evaluate for interval changes may be helpful for further evaluation as warranted. 2. No other evidence for acute or active infection elsewhere within the lumbar spine. 3. Advanced degenerative disc disease at L5-S1 with resultant mild bilateral L5 foraminal stenosis. 4. Additional mild spondylosis elsewhere within the lumbar spine as above. No other significant stenosis or impingement. Electronically Signed  By: Rise Mu M.D.   On: 06/20/2023 20:59   DG CHEST PORT 1 VIEW Result Date: 06/20/2023 CLINICAL DATA:  Short of breath. EXAM: PORTABLE CHEST 1 VIEW COMPARISON:  06/19/2023 and older studies. FINDINGS: Cardiac silhouette is normal in size and configuration. Normal mediastinal and hilar contours. Clear lungs.  No pleural effusion or pneumothorax. Skeletal structures are grossly intact. IMPRESSION: No active disease. Electronically Signed   By: Amie Portland M.D.   On: 06/20/2023 09:08   DG CHEST PORT 1 VIEW Result Date: 06/19/2023 CLINICAL DATA:  Fever. EXAM: PORTABLE CHEST 1 VIEW COMPARISON:  Chest x-ray dated June 17, 2023. FINDINGS: The heart size and mediastinal contours are within normal limits.  Lower lung volumes compared to prior. Slightly more prominent interstitial markings at both lung bases. No focal consolidation, pleural effusion, or pneumothorax. No acute osseous abnormality. IMPRESSION: 1. Slightly more prominent interstitial markings at both lung bases could reflect mild interstitial edema or atypical infection. Electronically Signed   By: Obie Dredge M.D.   On: 06/19/2023 12:34   ECHOCARDIOGRAM COMPLETE Result Date: 06/18/2023    ECHOCARDIOGRAM REPORT   Patient Name:   Victor [name] Date of Exam: 06/18/2023 Medical Rec #:  [id number]   Height:       67.0 in Accession #:    [id number]  Weight:       190.0 lb Date of Birth:  1971-01-12    BSA:          1.979 m Patient Age:    52 years    BP:           122/58 mmHg Patient Gender: M           HR:           109 bpm. Exam Location:  Inpatient Procedure: 2D Echo, Cardiac Doppler and Color Doppler Indications:    acute systolic chf  History:        Patient has prior history of Echocardiogram examinations, most                 recent 10/16/2013. Signs/Symptoms:Dyspnea; Risk                 Factors:Hypertension and Dyslipidemia.  Sonographer:    Delcie Roch RDCS Referring Phys: [phone] [facility] [place] IMPRESSIONS  1. Left ventricular ejection fraction, by estimation, is 70 to 75%. The left ventricle has hyperdynamic function. The LV outflow tract is narrowed. There is a mid-cavity gradient peak 48 mmHg. There appears to be an LV outflow peak gradient as high as 117 mmHg with Valsalva. Possible mild systolic anterior motion of the mitral valve. The left ventricle has no regional wall motion abnormalities. There is mild concentric left ventricular hypertrophy. Left ventricular diastolic parameters are consistent with Grade II diastolic dysfunction (pseudonormalization).  2. Right ventricular systolic function is normal. The right ventricular size is normal. Tricuspid regurgitation signal is inadequate for assessing PA pressure.  3. Left atrial size  was moderately dilated.  4. The mitral valve is abnormal. The valve is moderately calcified and restricted with calcification of the chords. Possible mild SAM but cannot be definitive. Trivial mitral valve regurgitation. Probably moderate mitral stenosis. The mean mitral valve gradient is 11.0 mmHg.  5. The aortic valve was not well visualized. Aortic valve regurgitation is not visualized. Moderate aortic valve stenosis. Aortic valve mean gradient measures 22.0 mmHg.  6. The inferior vena cava is normal in size with <50% respiratory variability, suggesting right atrial pressure of 8 mmHg.  7. Cannot rule out PFO vs small ASD (possible color flow across septum).  8. This patient needs a TEE. There are a number of abnormalities that are difficult to work out by TTE. He has a hyperdynamic left ventricle with a mid-cavity gradient. The LVOT is also significantly narrowed, possibly with SAM, with very high LVOT gradient with valsalva. 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 However, the mitral valve and aortic valve themselves are both calcified and thickened and appear to have moderate stenosis. FINDINGS  Left Ventricle: Left ventricular ejection fraction, by estimation, is 70 to 75%. The left ventricle has hyperdynamic function. The left ventricle has no regional wall motion abnormalities. The left ventricular internal cavity size was normal in size. There is mild concentric left ventricular hypertrophy. Left ventricular diastolic parameters are consistent with Grade II diastolic dysfunction (pseudonormalization). Right Ventricle: The right ventricular size is normal. No increase in right ventricular wall thickness. Right ventricular systolic function is normal. Tricuspid regurgitation signal is inadequate for assessing PA pressure. Left Atrium: Left atrial size was moderately dilated. Right Atrium: Right atrial size was normal in size. Pericardium: Trivial pericardial effusion is present. Mitral Valve:  The mitral valve is abnormal. There is moderate calcification of the mitral valve leaflet(s). Moderate mitral annular calcification. Trivial mitral valve regurgitation. Moderate mitral valve stenosis. MV peak gradient, 19.0 mmHg. The mean mitral valve gradient is 11.0 mmHg. Tricuspid Valve: The tricuspid valve is normal in structure. Tricuspid valve regurgitation is not demonstrated. Aortic Valve: The aortic valve was not well visualized. Aortic valve regurgitation is not visualized. Moderate aortic stenosis is present. Aortic valve mean gradient measures 22.0 mmHg. Aortic valve peak gradient measures 37.2 mmHg. Pulmonic Valve: The pulmonic valve was normal in structure. Pulmonic valve regurgitation is not visualized. Aorta: The aortic root is normal in size and structure. Venous: The inferior vena cava is normal in size with less than 50% respiratory variability, suggesting right atrial pressure of 8 mmHg. IAS/Shunts: Cannot rule out PFO vs small ASD (possible color flow across septum).  LEFT VENTRICLE PLAX 2D LVIDd:         4.30 cm   Diastology LVIDs:         2.70 cm   LV e' medial:    7.46 cm/s LV PW:         1.20 cm   LV E/e' medial:  28.2 LV IVS:        1.00 cm   LV e' lateral:   9.48 cm/s LVOT diam:     2.00 cm   LV E/e' lateral: 22.2 LVOT Area:     3.14 cm  RIGHT VENTRICLE             IVC RV Basal diam:  2.60 cm     IVC diam: 2.10 cm RV S prime:     16.00 cm/s TAPSE (M-mode): 2.5 cm LEFT ATRIUM             Index        RIGHT ATRIUM           Index LA diam:        4.50 cm 2.27 cm/m   RA Area:     14.70 cm LA Vol (A2C):   86.4 ml 43.66 ml/m  RA Volume:   36.50 ml  18.45 ml/m LA Vol (A4C):   96.0 ml 48.51 ml/m LA Biplane Vol: 92.2 ml 46.59 ml/m  AORTIC VALVE AV Vmax:      305.00 cm/s AV Vmean:  221.000 cm/s AV VTI:       0.471 m AV Peak Grad: 37.2 mmHg AV Mean Grad: 22.0 mmHg  AORTA Ao Root diam: 2.70 cm Ao Asc diam:  2.80 cm MITRAL VALVE MV Area (PHT): 3.46 cm     SHUNTS MV Peak grad:  19.0 mmHg     Systemic Diam: 2.00 cm MV Mean grad:  11.0 mmHg MV Vmax:       2.18 m/s MV Vmean:      156.0 cm/s MV Decel Time: 219 msec MV E velocity: 210.00 cm/s MV A velocity: 162.00 cm/s MV E/A ratio:  1.30 Dalton McleanMD Electronically signed by Wilfred Lacy Signature Date/Time: 06/18/2023/3:44:12 PM    Final    DG Chest 2 View Result Date: 06/17/2023 CLINICAL DATA:  Chest pain, shortness of breath EXAM: CHEST - 2 VIEW COMPARISON:  03/22/2023 FINDINGS: The heart size and mediastinal contours are within normal limits. Both lungs are clear. The visualized skeletal structures are unremarkable. IMPRESSION: Normal study. Electronically Signed   By: Charlett Nose M.D.   On: 06/17/2023 23:24    Microbiology: No results found for this or any previous visit (from the past 240 hours).   Labs: Basic Metabolic Panel: Recent Labs  Lab 07/08/23 0320 07/09/23 0430 07/10/23 0500 07/11/23 0438 07/12/23 0620 07/13/23 0800 07/14/23 0330  NA 128*   < > 127* 129* 129* 129* 129*  K 4.5   < > 4.4 4.0 3.9 4.3 4.2  CL 96*   < > 96* 97* 96* 97* 97*  CO2 24   < > 23 23 21* 23 22  GLUCOSE 117*   < > 122* 117* 115* 120* 112*  BUN 15   < > 13 12 14 12 10   CREATININE 0.79   < > 0.76 0.77 0.62 0.55* 0.66  CALCIUM 8.5*   < > 8.6* 8.7* 8.6* 8.9 8.9  MG 2.0  --   --   --   --   --   --    < > = values in this interval not displayed.   Liver Function Tests: No results for input(s): "AST", "ALT", "ALKPHOS", "BILITOT", "PROT", "ALBUMIN" in the last 168 hours. No results for input(s): "LIPASE", "AMYLASE" in the last 168 hours. No results for input(s): "AMMONIA" in the last 168 hours. CBC: Recent Labs  Lab 07/08/23 0320 07/08/23 2030 07/09/23 0430 07/10/23 0500 07/10/23 2035 07/11/23 0438 07/14/23 0330  WBC 8.6  --  9.6 8.9  --  8.9 8.7  HGB 6.7*   < > 7.4* 7.2* 8.9* 8.7* 9.1*  HCT 21.1*   < > 22.7* 22.4* 27.5* 26.8* 28.3*  MCV 91.7  --  89.4 90.3  --  89.0 89.0  PLT 176  --  203 205  --  218 238   < > = values  in this interval not displayed.   Cardiac Enzymes: No results for input(s): "CKTOTAL", "CKMB", "CKMBINDEX", "TROPONINI" in the last 168 hours. BNP: BNP (last 3 results) Recent Labs    03/22/23 1659 06/17/23 2240  BNP 160.9* 592.9*    ProBNP (last 3 results) No results for input(s): "PROBNP" in the last 8760 hours.  CBG: Recent Labs  Lab 07/08/23 2131 07/09/23 0619 07/09/23 1150 07/09/23 1643 07/09/23 2108  GLUCAP 120* 143* 118* 136* 120*       Signed:  Zannie Cove MD.  [facility] 07/14/2023, 10:32 AM

## 2023-07-14 NOTE — TOC Transition Note (Signed)
Transition of Care [facility]) - Discharge Note   Patient Details  Name: Victor [name] MRN: [id number] Date of Birth: October 23, 1970  Transition of Care [facility]) CM/SW Contact:  Ronny Bacon, RN Phone Number: 07/14/2023, 10:43 AM   Clinical Narrative:  Patient is being discharged today. Spoke with patient by phone, confirmed that his IV antibiotics were delivered to his son last night, his daughter is picking him up to transport him home. Cory with [facility] made aware.     Final next level of care: Home w Home Health Services Barriers to Discharge: No Barriers Identified   Patient Goals and CMS Choice Patient states their goals for this hospitalization and ongoing recovery are:: return home CMS Medicare.gov Compare Post Acute Care list provided to:: Patient Choice offered to / list presented to : Patient      Discharge Placement                       Discharge Plan and Services Additional resources added to the After Visit Summary for   In-house Referral: Clinical Social Work Discharge Planning Services: CM Consult Post Acute Care Choice: Home Health, Durable Medical Equipment          DME Arranged: Dan Humphreys rolling DME Agency: [employer] Date DME Agency Contacted: 07/12/23 Time DME Agency Contacted: 1100 Representative spoke with at DME Agency: Vaughan Basta HH Arranged: RN, IV Antibiotics HH Agency: [facility], Other - See comment ([facility]) Date [facility] Agency Contacted: 07/08/23 Time HH Agency Contacted: 1413 Representative spoke with at [facility] Agency: Kandee Keen and Santina Evans  Social Drivers of Health (SDOH) Interventions SDOH Screenings   Food Insecurity: Food Insecurity Present (06/18/2023)  Housing: Medium Risk (06/18/2023)  Transportation Needs: Unmet Transportation Needs (06/18/2023)  Utilities: Not At Risk (06/18/2023)  Depression (PHQ2-9): Medium Risk (05/07/2023)  Tobacco Use: High Risk (07/02/2023)     Readmission Risk Interventions     No data  to display

## 2023-07-15 ENCOUNTER — Ambulatory Visit: Payer: Medicaid Other | Attending: Cardiology

## 2023-07-15 ENCOUNTER — Other Ambulatory Visit: Payer: Self-pay

## 2023-07-15 ENCOUNTER — Telehealth: Payer: Self-pay

## 2023-07-15 DIAGNOSIS — R7881 Bacteremia: Secondary | ICD-10-CM | POA: Diagnosis not present

## 2023-07-15 DIAGNOSIS — Z952 Presence of prosthetic heart valve: Secondary | ICD-10-CM

## 2023-07-15 DIAGNOSIS — I38 Endocarditis, valve unspecified: Secondary | ICD-10-CM | POA: Diagnosis not present

## 2023-07-15 DIAGNOSIS — Z5181 Encounter for therapeutic drug level monitoring: Secondary | ICD-10-CM | POA: Diagnosis not present

## 2023-07-15 LAB — POCT INR: INR: 3.8 — AB (ref 2.0–3.0)

## 2023-07-15 NOTE — Transitions of Care (Post Inpatient/ED Visit) (Signed)
   07/15/2023  Name: Victor [name] MRN: [id number] DOB: [birth year]  Today's TOC FU Call Status: Today's TOC FU Call Status:: Unsuccessful Call (1st Attempt) Unsuccessful Call (1st Attempt) Date: 07/15/23  Attempted to reach the patient regarding the most recent Inpatient/ED visit.  Follow Up Plan: Additional outreach attempts will be made to reach the patient to complete the Transitions of Care (Post Inpatient/ED visit) call.   Alyse Low, RN, BA, [facility], CRRN [facility] Coordinator, Transition of Care Ph # [phone]

## 2023-07-15 NOTE — Patient Instructions (Signed)
HOLD TONIGHT ONLY THEN CONTINUE 1 tablet Daily INR in 1 week.  [phone]  A full discussion of the nature of anticoagulants has been carried out.  A benefit risk analysis has been presented to the patient, so that they understand the justification for choosing anticoagulation at this time. The need for frequent and regular monitoring, precise dosage adjustment and compliance is stressed.  Side effects of potential bleeding are discussed.  The patient should avoid any OTC items containing aspirin or ibuprofen, and should avoid great swings in general diet.  Avoid alcohol consumption.  Call if any signs of abnormal bleeding.

## 2023-07-16 DIAGNOSIS — R7881 Bacteremia: Secondary | ICD-10-CM | POA: Diagnosis not present

## 2023-07-17 DIAGNOSIS — R7881 Bacteremia: Secondary | ICD-10-CM | POA: Diagnosis not present

## 2023-07-18 ENCOUNTER — Ambulatory Visit: Payer: Self-pay

## 2023-07-18 ENCOUNTER — Other Ambulatory Visit: Payer: Self-pay | Admitting: Thoracic Surgery (Cardiothoracic Vascular Surgery)

## 2023-07-18 ENCOUNTER — Ambulatory Visit
Admission: RE | Admit: 2023-07-18 | Discharge: 2023-07-18 | Disposition: A | Discharge status: Home / Self Care | Payer: Medicaid Other | Source: Ambulatory Visit | Attending: Thoracic Surgery (Cardiothoracic Vascular Surgery) | Admitting: Thoracic Surgery (Cardiothoracic Vascular Surgery)

## 2023-07-18 ENCOUNTER — Telehealth: Payer: Self-pay

## 2023-07-18 VITALS — BP 149/81 | HR 103 | Resp 20 | Ht 66.0 in | Wt 191.0 lb

## 2023-07-18 DIAGNOSIS — R7881 Bacteremia: Secondary | ICD-10-CM | POA: Diagnosis not present

## 2023-07-18 DIAGNOSIS — J90 Pleural effusion, not elsewhere classified: Secondary | ICD-10-CM | POA: Diagnosis not present

## 2023-07-18 DIAGNOSIS — Z954 Presence of other heart-valve replacement: Secondary | ICD-10-CM

## 2023-07-18 DIAGNOSIS — Z9889 Other specified postprocedural states: Secondary | ICD-10-CM

## 2023-07-18 DIAGNOSIS — I059 Rheumatic mitral valve disease, unspecified: Secondary | ICD-10-CM

## 2023-07-18 DIAGNOSIS — I38 Endocarditis, valve unspecified: Secondary | ICD-10-CM | POA: Diagnosis not present

## 2023-07-18 NOTE — Progress Notes (Signed)
HPI: Mr. Victor [name] is a 52 year old gentleman with a past history of type 2 diabetes mellitus, hypertension, alcoholic cirrhosis.  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  He underwent mechanical mitral valve replacement by Dr. Leafy Ro on 07/02/2023.  Postoperatively, penicillin-G was continued by way of PICC line.  He was anticoagulated with Coumadin.  He had a progressive and uneventful course.  He was transferred back to the hospitalist service after transitioning out of the ICU.  He is being followed by cardiology and infectious disease.  He continues the Coumadin with last INR on 07/15/2023 of 3.8.  His Coumadin was reduced to 5 mg daily at that time.  He also continues on IV penicillin G by way of the PICC line through 07/30/2023 per ID recommendations.   Victor [name] returns to the office today for scheduled follow-up.  He reports he is gradually gaining strength in his overall pleased with his progress.  He denies shortness of breath.  He continues to have some parasternal soreness but denies having chest pain.   Past Medical History:  Diagnosis Date   CAD (coronary artery disease)    Nonobstructive on [facility] 06/2023   Dizziness 10/15/2013    "SWIMMY HEAD    "   HOCM (hypertrophic obstructive cardiomyopathy) (HCC)    Hypertension    Hypertriglyceridemia    Obesity    Severe mitral regurgitation     Past Surgical History:  Procedure Laterality Date   BIOPSY  06/28/2023   Procedure: BIOPSY;  Surgeon: Benancio Deeds, MD;  Location: [facility] ENDOSCOPY;  Service: Gastroenterology;;   COLONOSCOPY WITH PROPOFOL N/A 06/28/2023   Procedure: COLONOSCOPY WITH PROPOFOL;  Surgeon: Benancio Deeds, MD;  Location: [facility] ENDOSCOPY;  Service: Gastroenterology;  Laterality: N/A;   ESOPHAGOGASTRODUODENOSCOPY (EGD) WITH PROPOFOL N/A 06/28/2023   Procedure: ESOPHAGOGASTRODUODENOSCOPY (EGD) WITH PROPOFOL;   Surgeon: Benancio Deeds, MD;  Location: [facility] ENDOSCOPY;  Service: Gastroenterology;  Laterality: N/A;   EXPLORATION POST OPERATIVE OPEN HEART N/A 07/02/2023   Procedure: EXPLORATION POST OPERATIVE OPEN HEART;  Surgeon: Eugenio Hoes, MD;  Location: [facility] OR;  Service: Open Heart Surgery;  Laterality: N/A;   INGUINAL HERNIA REPAIR Right 02/03/2021   Procedure: HERNIA REPAIR INGUINAL ADULT;  Surgeon: Diamantina Monks, MD;  Location: [facility] OR;  Service: General;  Laterality: Right;   INSERTION OF MESH Right 02/03/2021   Procedure: INSERTION OF MESH;  Surgeon: Diamantina Monks, MD;  Location: [facility] OR;  Service: General;  Laterality: Right;   MITRAL VALVE REPLACEMENT N/A 07/02/2023   Procedure: MITRAL VALVE (MV) REPLACEMENT USING 29 MM SJM MASTES SERIES MECHANICAL HEART VALVE, SUBAORTIC MYECTOMY;  Surgeon: Eugenio Hoes, MD;  Location: [facility] OR;  Service: Open Heart Surgery;  Laterality: N/A;   NO PAST SURGERIES     POLYPECTOMY  06/28/2023   Procedure: POLYPECTOMY;  Surgeon: Benancio Deeds, MD;  Location: [facility] ENDOSCOPY;  Service: Gastroenterology;;   RIGHT/LEFT HEART CATH AND CORONARY ANGIOGRAPHY N/A 06/24/2023   Procedure: RIGHT/LEFT HEART CATH AND CORONARY ANGIOGRAPHY;  Surgeon: Yvonne Kendall, MD;  Location: [facility] INVASIVE CV LAB;  Service: Cardiovascular;  Laterality: N/A;   SUBMUCOSAL LIFTING INJECTION  06/28/2023   Procedure: SUBMUCOSAL LIFTING INJECTION;  Surgeon: Benancio Deeds, MD;  Location: [facility] ENDOSCOPY;  Service: Gastroenterology;;   SUBMUCOSAL TATTOO INJECTION  06/28/2023   Procedure: SUBMUCOSAL TATTOO INJECTION;  Surgeon: Benancio Deeds, MD;  Location: [facility] ENDOSCOPY;  Service: Gastroenterology;;  TEE WITHOUT CARDIOVERSION N/A 07/02/2023   Procedure: TRANSESOPHAGEAL ECHOCARDIOGRAM (TEE);  Surgeon: Eugenio Hoes, MD;  Location: [facility] OR;  Service: Open Heart Surgery;  Laterality: N/A;   TRANSESOPHAGEAL ECHOCARDIOGRAM (CATH LAB) N/A 06/21/2023   Procedure: TRANSESOPHAGEAL ECHOCARDIOGRAM;   Surgeon: Sande Rives, MD;  Location: [facility] INVASIVE CV LAB;  Service: Cardiovascular;  Laterality: N/A;    Family History  Problem Relation Age of Onset   CAD Mother        stenting @ 64; alive @ 17   Aneurysm Mother        thoracic s/p surgery @ 42   Lung disease Mother    Other Father        unknown    Social History Social History   Tobacco Use   Smoking status: Never   Smokeless tobacco: Current    Types: Chew  Vaping Use   Vaping status: Never Used  Substance Use Topics   Alcohol use: Yes    Comment: 4 12 oz beers twice/wk.   Drug use: No    Current Outpatient Medications  Medication Sig Dispense Refill   acetaminophen (TYLENOL) 500 MG tablet Take 1,000 mg by mouth in the morning, at noon, and at bedtime.     ferrous gluconate (FERGON) 324 MG tablet Take 1 tablet (324 mg total) by mouth daily with breakfast. 60 tablet 1   gabapentin (NEURONTIN) 300 MG capsule Take 1 capsule (300 mg total) by mouth at bedtime. 30 capsule 1   gemfibrozil (LOPID) 600 MG tablet Take 1 tablet (600 mg total) by mouth 2 (two) times daily before a meal. (Patient not taking: Reported on 06/18/2023) 60 tablet 0   losartan (COZAAR) 25 MG tablet Take 1 tablet (25 mg total) by mouth daily. 30 tablet 1   metoprolol tartrate (LOPRESSOR) 25 MG tablet Take 1 tablet (25 mg total) by mouth 2 (two) times daily. 60 tablet 1   penicillin G IVPB Inject 24 Million Units into the vein daily for 27 days. Indication:  Strep salivarius MV IE First Dose: Yes Last Day of Therapy:  07/30/23 Labs - Once weekly:  CBC/D and BMP, Labs - Once weekly: ESR and CRP Method of administration: Elastomeric (Continuous infusion) Method of administration may be changed at the discretion of home infusion pharmacist based upon assessment of the patient and/or caregiver's ability to self-administer the medication ordered. 27 Units 0   rosuvastatin (CRESTOR) 20 MG tablet Take 1 tablet (20 mg total) by mouth daily. (Patient not  taking: Reported on 06/18/2023) 30 tablet 0   senna-docusate (SENOKOT-S) 8.6-50 MG tablet Take 1 tablet by mouth at bedtime as needed for mild constipation. 30 tablet 0   spironolactone (ALDACTONE) 25 MG tablet Take 0.5 tablets (12.5 mg total) by mouth daily. 30 tablet 0   torsemide (DEMADEX) 10 MG tablet Take 1 tablet (10 mg total) by mouth daily. 30 tablet 0   traMADol (ULTRAM) 50 MG tablet Take 1 tablet (50 mg total) by mouth every 6 (six) hours as needed for severe pain (pain score 7-10). 30 tablet 0   traZODone (DESYREL) 50 MG tablet Take 1 tablet (50 mg total) by mouth at bedtime. 30 tablet 0   warfarin (COUMADIN) 5 MG tablet Take 5mg  on Tuesday Thursday Saturday and Sunday and 7.5 mg on Monday Wednesday Friday 45 tablet 1   No current facility-administered medications for this visit.    Allergies  Allergen Reactions   Iodinated Contrast Media Itching   Gadolinium Derivatives  Physical Exam Vital signs BP 149/81 Heart rate 23 Respiratory rate 20 SpO2 96% on room air  General: 52 year old male in no distress and in good spirits today.  He is walking with a cane for balance. Heart: Borderline tachycardia but rate is regular.  He has a soft systolic murmur and crisp click of the mechanical mitral valve. Chest: Sternotomy incision is healing with no sign of complication as are the chest tube sites.  Sternum is stable.  Breath sounds are full, clear, and equal. Extremities: There is no peripheral edema.  The PICC site in the right upper arm is clean and dry.    Diagnostic Tests: A PA and lateral chest x-ray was obtained at [facility] after the visit today.  The x-ray has not been read by the radiologist yet but images were reviewed and show an increase in the right moderate pleural effusion.  Lung fields are otherwise clear and the sternal wires are properly aligned.   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  He remains on IV penicillin G per ID recommendations through 07/30/2023.  He is anticoagulated with an INR of 3.8 on 12/23, Coumadin dosing was adjusted by the Coumadin clinic 5 mg daily.  His blood pressure and heart rate are mildly elevated.  After I pointed this out, he realized he was taking metoprolol only once a day.  He agreed to start taking the 25 mg of metoprolol twice daily.  We reviewed activity limitations and sternal precautions.  No change in medications from CT surgery standpoint.  The right pleural effusion has increased in size since the x-ray obtained prior to discharge.  I think it would be worthwhile having this drained.  Will refer to the interventional radiology team for right thoracentesis.  I called Victor [name] after the visit today regarding this finding and the plan for thoracentesis.   He has scheduled follow-up with the Coumadin clinic on 07/23/2023 and also with cardiology on the same date. Plan follow-up with Dr. Leafy Ro in 1 month.   Victor Roca, PA-C [facility] [phone]

## 2023-07-18 NOTE — Patient Instructions (Addendum)
Continue to observe sternal precautions with no lifting, pushing, or pulling more than 15 pounds for another 6 weeks.  After that, Gradually increase activity without limitation.  Start taking the metoprolol twice daily discussed.  The x-ray obtained today shows an increase in the collection right lung.  We will make referral to the interventional radiology team to arrange for drainage of the fluid collection.  Follow-up with Dr. Leafy Ro in 1 month and as needed.

## 2023-07-18 NOTE — Telephone Encounter (Signed)
Patient came in requesting a letter from PCP stating his health problems to take to SSI, patient states he wants to start his disability process.

## 2023-07-19 ENCOUNTER — Other Ambulatory Visit: Payer: Self-pay

## 2023-07-19 ENCOUNTER — Telehealth: Payer: Self-pay

## 2023-07-19 ENCOUNTER — Encounter: Payer: Self-pay | Admitting: Nurse Practitioner

## 2023-07-19 ENCOUNTER — Encounter: Payer: Self-pay | Admitting: *Deleted

## 2023-07-19 DIAGNOSIS — I38 Endocarditis, valve unspecified: Secondary | ICD-10-CM | POA: Diagnosis not present

## 2023-07-19 MED ORDER — DIPHENHYDRAMINE HCL 50 MG PO TABS
ORAL_TABLET | ORAL | 0 refills | Status: AC
  Filled 2023-07-19: qty 1, fill #0

## 2023-07-19 MED ORDER — METOPROLOL TARTRATE 25 MG PO TABS
25.0000 mg | ORAL_TABLET | Freq: Two times a day (BID) | ORAL | 1 refills | Status: AC
  Filled 2023-07-19: qty 60, 30d supply, fill #0
  Filled 2023-09-03: qty 60, 30d supply, fill #1

## 2023-07-19 MED ORDER — PREDNISONE 50 MG PO TABS
ORAL_TABLET | ORAL | 0 refills | Status: AC
Start: 2023-07-19 — End: ?
  Filled 2023-07-19: qty 3, 1d supply, fill #0

## 2023-07-19 NOTE — Progress Notes (Signed)
Contrast dye allergy mediatation called into patient's preferred pharmacy in preparation for a thoracentesis on 1/3. Patient is aware.

## 2023-07-19 NOTE — Telephone Encounter (Signed)
Letter is in mychart.

## 2023-07-19 NOTE — Progress Notes (Signed)
Patient contacted the office to state he did not have Metoprolol prescription that was talked about yesterday with PA, Myron. He states that he attempted to contact the pharmacy but they do not have the prescription. Advised we will refill it this time, but should come from Cardiology in the future. He acknowledged receipt.

## 2023-07-19 NOTE — Transitions of Care (Post Inpatient/ED Visit) (Signed)
   07/19/2023  Name: Derryck Longer MRN: [id number] DOB: 1971-03-29  Today's TOC FU Call Status: Today's TOC FU Call Status:: Unsuccessful Call (2nd Attempt) Unsuccessful Call (2nd Attempt) Date: 07/19/23  Attempted to reach the patient regarding the most recent Inpatient/ED visit.  Follow Up Plan: Additional outreach attempts will be made to reach the patient to complete the Transitions of Care (Post Inpatient/ED visit) call.   Alyse Low, RN, BA, [facility], CRRN [facility] Coordinator, Transition of Care Ph # [phone]

## 2023-07-19 NOTE — Telephone Encounter (Signed)
Mychart message sent.

## 2023-07-20 DIAGNOSIS — I38 Endocarditis, valve unspecified: Secondary | ICD-10-CM | POA: Diagnosis not present

## 2023-07-20 DIAGNOSIS — R7881 Bacteremia: Secondary | ICD-10-CM | POA: Diagnosis not present

## 2023-07-21 DIAGNOSIS — R7881 Bacteremia: Secondary | ICD-10-CM | POA: Diagnosis not present

## 2023-07-21 DIAGNOSIS — I38 Endocarditis, valve unspecified: Secondary | ICD-10-CM | POA: Diagnosis not present

## 2023-07-21 NOTE — Progress Notes (Addendum)
 Cardiology Office Note:  .   Date:  07/23/2023  ID:  Victor [name], DOB 01-24-1971, MRN [id number] PCP: Victor [name] ORN, NP  [facility] Providers Heart Valve Cardiologist:  Victor Fell, MD  General Cardiologist: Dr. Barbaraann }   History of Present Illness: .   Victor [name] is a 52 y.o. male with history of obesity, hyperlipidemia, hypertension, EtOH who presented to the emergency to room with complaints of shortness of breath on 06/17/2023.    TEE noted subaortic membrane with dynamic chordal SAM with dynamic outflow tract obstruction with peak gradient of 44 mmHg,, chordal SAM up to 40 mmHg, and mitral valve vegetations with degenerative calcified leaflets and severe mitral regurgitation.  EF was 70-75%   Patient had cardiac catheterization revealing minimal nonobstructive CAD and elevated V waves and filling pressures as well as subaortic gradient which was increased PVCs suggesting potential hypertrophic cardiomyopathy.  On 07/02/2023 he underwent an MVR by Dr. Maryjane for severe MR with endocarditis using a mechanical Saint Jude 29 mm valve.  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  The patient was started on Coumadin  therapy and on discharge went to short-term rehab.  On discharge from rehab, he was continued on metoprolol  losartan  and Aldactone  and resumed on low-dose torsemide .   He was also noted to have severe iron  deficiency anemia and had have 1 unit of packed red blood cells during hospitalization postoperatively he was placed on oral iron  at discharge.  He is scheduled for repeat echocardiogram on 08/15/2023.  He comes today without any significant complaints.  He does have some right-sided chest discomfort.  He has a PICC line in his right upper arm for penicillin  infusion.  He reports that he is scheduled for thoracentesis on January 3, with  interventional radiology through [facility].  He has had recent lab work completed through home health nurses.  He is no longer on diuretics.  ROS: As above otherwise negative.  Studies Reviewed: SABRA   EKG Interpretation Date/Time:  Tuesday July 23 2023 14:47:49 EST Ventricular Rate:  72 PR Interval:  120 QRS Duration:  136 QT Interval:  476 QTC Calculation: 521 R Axis:   -60  Text Interpretation: Normal sinus rhythm Right bundle branch block Left anterior fascicular block Bifascicular block Left ventricular hypertrophy with repolarization abnormality ( R in aVL ) Possible Lateral infarct , age undetermined When compared with ECG of 03-Jul-2023 06:41, Borderline criteria for Lateral infarct are now Present T wave amplitude has decreased in Inferior leads T wave inversion now evident in Anterior leads Confirmed by Victor [name] [phone]) on 07/23/2023 3:51:12 PM    Cardiac MR 06/24/2023 IMPRESSION: 1.  Small pericardial effusion.   2. Vigorous LV systolic function, EF 69%. There was moderate asymmetric basal to mid septal hypertrophy. Suspect LV outflow gradient given narrowed LVOT and turbulence in LVOT. Difficult study with poor images due to respiratory artifact, unable to comment on subaortic membrane presence.   3.  Normal RV size and systolic function, EF 56%.   4. The mitral valve leaflets appear thickened. Images not sufficient to comment on presence or absence of vegetation. There is either chordal SAM or MV SAM (images not sufficient to distinguish). There is moderate-severe mitral regurgitation with mitral regurgitant fraction 39% and regurgitant volume 54 cc.   5. Diffuse non-coronary LGE pattern. This could be consistent with hypertrophic cardiomyopathy (or another infiltrative disease).  6. Elevated extracellular volume percentage at 39%. This suggests increased myocardial fibrotic content.   Victor [name]  [facility] 06/24/2023 Conclusions: Nonobstructive coronary  artery disease, as detailed below, including sequential 20% and 20-30% proximal/mid LAD stenoses, 50-60% ostial D1 lesion, and mild plaquing of OM1 and mid RCA. Severely elevated left heart and pulmonary artery pressures (PCWP 34 mmHg, LVEDP 40 mmHg, mean PA 46 mmHg). Mildly elevated right heart filling pressures (mean RA 7 mmHg, RVEDP 9 mmHg). Normal Fick cardiac output/index (CO 6.4 L/min, CI 3.3 L/min/m^2). 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 Suspected acute allergic reaction to IV contrast treated with IV diphenhydramine  and methylprednisolone .  Physical Exam:   VS:  BP 132/66 (BP Location: Right Arm, Patient Position: Sitting, Cuff Size: Normal)   Pulse 72   Ht 5' 6 (1.676 m)   Wt 194 lb 12.8 oz (88.4 kg)   SpO2 97%   BMI 31.44 kg/m    Wt Readings from Last 3 Encounters:  07/23/23 194 lb 12.8 oz (88.4 kg)  07/18/23 191 lb (86.6 kg)  07/14/23 191 lb (86.6 kg)    GEN: Well nourished, well developed in no acute distress NECK: No JVD; No carotid bruits CARDIAC: RRR, crisp systolic click heard best on the right sternal border and apex, no murmurs, rubs, gallops RESPIRATORY:  Clear to auscultation without rales, wheezing or rhonchi  ABDOMEN: Soft, non-tender, non-distended EXTREMITIES:  No edema; No deformity, right brachial PICC line with infusion attached.  ASSESSMENT AND PLAN: .   Endocarditis of the mitral valve: Status post mitral valve replacement with Saint Jude 20 mm valve by Dr. Maryjane on 07/02/2023.  He now has a PICC line with infusion of penicillin  which should be removed later this week.  He has follow-up appoint with CVTS in 1 week.  Released to cardiac rehab will be deferred to CVTS physicians.       He remains on Coumadin  therapy and has been seen by Coumadin  clinic today.  Adjustments in his medications from  warfarin 5 mg Tuesdays Thursdays and Saturdays and Sundays and 7.5 mg on Monday Wednesday and Fridays has been changed due to elevated INR of 3.7.  He will now be on Coumadin  5 mg daily until follow-up appointment in 1 week.  He is due to have a follow-up echocardiogram on 08/15/2023.  2.  Hypertension: Blood pressure is controlled currently.  He remains on losartan  25 mg daily and metoprolol  25 mg twice daily.  The patient is also on 12.5 mg of spironolactone .  Labs have recently been drawn by home health nurse and not available to review at this time as they were drawn less than 24 hours ago.  These were ordered by interventional radiology/Dr. Whitman.  Kidney function and potassium status should be reviewed.  Most recent labs drawn on 07/14/2023 patient potassium was 4.2, creatinine 0.66.  3.  Hypercholesterolemia: He is on rosuvastatin  20 mg daily.  LP(a) drawn on 06/25/2023 with 186.7.   4.  Severe iron  deficiency anemia: Status post 1 unit of packed red blood cells during recent hospitalization and now on oral iron  daily.    Cardiac Rehabilitation Eligibility Assessment  The patient is ready to start cardiac rehabilitation pending clearance from the cardiac surgeon.         Signed, Lamarr HERO. Victor CHOL, ANP, AACC

## 2023-07-22 ENCOUNTER — Telehealth: Payer: Self-pay

## 2023-07-22 DIAGNOSIS — R7881 Bacteremia: Secondary | ICD-10-CM | POA: Diagnosis not present

## 2023-07-22 DIAGNOSIS — I38 Endocarditis, valve unspecified: Secondary | ICD-10-CM | POA: Diagnosis not present

## 2023-07-22 NOTE — Transitions of Care (Post Inpatient/ED Visit) (Signed)
   07/22/2023  Name: Victor [name] MRN: [id number] DOB: 10/04/1970  Today's TOC FU Call Status: Today's TOC FU Call Status:: Unsuccessful Call (3rd Attempt) Unsuccessful Call (3rd Attempt) Date: 07/22/23  Attempted to reach the patient regarding the most recent Inpatient/ED visit.  Follow Up Plan: No further outreach attempts will be made at this time. We have been unable to contact the patient.  Alyse Low, RN, BA, [facility], CRRN [facility] Coordinator, Transition of Care Ph # [phone]

## 2023-07-23 ENCOUNTER — Ambulatory Visit: Payer: Medicaid Other | Attending: Adult Health | Admitting: Adult Health

## 2023-07-23 ENCOUNTER — Encounter: Payer: Self-pay | Admitting: Adult Health

## 2023-07-23 ENCOUNTER — Ambulatory Visit (INDEPENDENT_AMBULATORY_CARE_PROVIDER_SITE_OTHER): Payer: Medicaid Other | Admitting: *Deleted

## 2023-07-23 VITALS — BP 132/66 | HR 72 | Ht 66.0 in | Wt 194.8 lb

## 2023-07-23 DIAGNOSIS — Z952 Presence of prosthetic heart valve: Secondary | ICD-10-CM

## 2023-07-23 DIAGNOSIS — E7800 Pure hypercholesterolemia, unspecified: Secondary | ICD-10-CM | POA: Diagnosis not present

## 2023-07-23 DIAGNOSIS — I10 Essential (primary) hypertension: Secondary | ICD-10-CM

## 2023-07-23 DIAGNOSIS — D509 Iron deficiency anemia, unspecified: Secondary | ICD-10-CM

## 2023-07-23 DIAGNOSIS — R7881 Bacteremia: Secondary | ICD-10-CM | POA: Diagnosis not present

## 2023-07-23 DIAGNOSIS — I059 Rheumatic mitral valve disease, unspecified: Secondary | ICD-10-CM

## 2023-07-23 DIAGNOSIS — I38 Endocarditis, valve unspecified: Secondary | ICD-10-CM | POA: Diagnosis not present

## 2023-07-23 LAB — POCT INR: INR: 2.7 (ref 2.0–3.0)

## 2023-07-23 NOTE — Patient Instructions (Signed)
Medication Instructions:  No changes *If you need a refill on your cardiac medications before your next appointment, please call your pharmacy*   Lab Work: No labs If you have labs (blood work) drawn today and your tests are completely normal, you will receive your results only by: MyChart Message (if you have MyChart) OR A paper copy in the mail If you have any lab test that is abnormal or we need to change your treatment, we will call you to review the results.   Testing/Procedures: No Testing    Follow-Up: At [facility], you and your health needs are our priority.  As part of our continuing mission to provide you with exceptional heart care, we have created designated Provider Care Teams.  These Care Teams include your primary Cardiologist (physician) and Advanced Practice Providers (APPs -  Physician Assistants and Nurse Practitioners) who all work together to provide you with the care you need, when you need it.  We recommend signing up for the patient portal called "MyChart".  Sign up information is provided on this After Visit Summary.  MyChart is used to connect with patients for Virtual Visits (Telemedicine).  Patients are able to view lab/test results, encounter notes, upcoming appointments, etc.  Non-urgent messages can be sent to your provider as well.   To learn more about what you can do with MyChart, go to [email or web address].    Your next appointment:   3 month(s)  Provider:   Reatha Harps, MD

## 2023-07-23 NOTE — Patient Instructions (Signed)
 Description   Continue taking warfarin 1 tablet daily. Recheck INR in 1 week.  Anticoagulation Clinic [phone]

## 2023-07-24 DIAGNOSIS — Z419 Encounter for procedure for purposes other than remedying health state, unspecified: Secondary | ICD-10-CM | POA: Diagnosis not present

## 2023-07-24 DIAGNOSIS — R7881 Bacteremia: Secondary | ICD-10-CM | POA: Diagnosis not present

## 2023-07-24 DIAGNOSIS — I38 Endocarditis, valve unspecified: Secondary | ICD-10-CM | POA: Diagnosis not present

## 2023-07-25 DIAGNOSIS — R7881 Bacteremia: Secondary | ICD-10-CM | POA: Diagnosis not present

## 2023-07-25 DIAGNOSIS — I38 Endocarditis, valve unspecified: Secondary | ICD-10-CM | POA: Diagnosis not present

## 2023-07-26 ENCOUNTER — Other Ambulatory Visit: Payer: Self-pay | Admitting: Thoracic Surgery (Cardiothoracic Vascular Surgery)

## 2023-07-26 ENCOUNTER — Ambulatory Visit (HOSPITAL_COMMUNITY)
Admission: RE | Admit: 2023-07-26 | Discharge: 2023-07-26 | Disposition: A | Discharge status: Home / Self Care | Payer: Medicaid Other | Source: Ambulatory Visit | Attending: Thoracic Surgery (Cardiothoracic Vascular Surgery) | Admitting: Thoracic Surgery (Cardiothoracic Vascular Surgery)

## 2023-07-26 DIAGNOSIS — Z452 Encounter for adjustment and management of vascular access device: Secondary | ICD-10-CM | POA: Diagnosis not present

## 2023-07-26 DIAGNOSIS — J90 Pleural effusion, not elsewhere classified: Secondary | ICD-10-CM | POA: Diagnosis not present

## 2023-07-26 DIAGNOSIS — I38 Endocarditis, valve unspecified: Secondary | ICD-10-CM | POA: Diagnosis not present

## 2023-07-26 DIAGNOSIS — R918 Other nonspecific abnormal finding of lung field: Secondary | ICD-10-CM | POA: Diagnosis not present

## 2023-07-26 DIAGNOSIS — Z48813 Encounter for surgical aftercare following surgery on the respiratory system: Secondary | ICD-10-CM | POA: Diagnosis not present

## 2023-07-26 DIAGNOSIS — Z954 Presence of other heart-valve replacement: Secondary | ICD-10-CM | POA: Diagnosis not present

## 2023-07-26 DIAGNOSIS — R7881 Bacteremia: Secondary | ICD-10-CM | POA: Diagnosis not present

## 2023-07-26 HISTORY — PX: IR THORACENTESIS ASP PLEURAL SPACE W/IMG GUIDE: IMG5380

## 2023-07-26 MED ORDER — LIDOCAINE HCL 1 % IJ SOLN
INTRAMUSCULAR | Status: AC
  Filled 2023-07-26: qty 20

## 2023-07-26 MED ORDER — LIDOCAINE HCL (PF) 1 % IJ SOLN
10.0000 mL | Freq: Once | INTRAMUSCULAR | Status: AC
  Administered 2023-07-26: 10 mL

## 2023-07-26 NOTE — Procedures (Signed)
 PROCEDURE SUMMARY:  Successful US  guided right thoracentesis. Yielded 1000 mL of blood-tinged fluid. Pt tolerated procedure well. No immediate complications.  Specimen was not sent for labs. CXR ordered.  EBL < 5 mL  Solmon Selmer Ku PA-C 07/26/2023 1:53 PM

## 2023-07-27 DIAGNOSIS — I38 Endocarditis, valve unspecified: Secondary | ICD-10-CM | POA: Diagnosis not present

## 2023-07-27 DIAGNOSIS — R7881 Bacteremia: Secondary | ICD-10-CM | POA: Diagnosis not present

## 2023-07-28 DIAGNOSIS — R7881 Bacteremia: Secondary | ICD-10-CM | POA: Diagnosis not present

## 2023-07-28 DIAGNOSIS — I38 Endocarditis, valve unspecified: Secondary | ICD-10-CM | POA: Diagnosis not present

## 2023-07-29 DIAGNOSIS — I38 Endocarditis, valve unspecified: Secondary | ICD-10-CM | POA: Diagnosis not present

## 2023-07-29 DIAGNOSIS — R7881 Bacteremia: Secondary | ICD-10-CM | POA: Diagnosis not present

## 2023-07-30 ENCOUNTER — Ambulatory Visit: Payer: Medicaid Other

## 2023-07-30 ENCOUNTER — Telehealth: Payer: Self-pay

## 2023-07-30 ENCOUNTER — Encounter: Payer: Self-pay | Admitting: Internal Medicine

## 2023-07-30 ENCOUNTER — Ambulatory Visit (INDEPENDENT_AMBULATORY_CARE_PROVIDER_SITE_OTHER): Payer: Medicaid Other | Admitting: Internal Medicine

## 2023-07-30 ENCOUNTER — Other Ambulatory Visit: Payer: Self-pay

## 2023-07-30 VITALS — BP 172/78 | HR 74 | Resp 16 | Ht 66.0 in | Wt 192.0 lb

## 2023-07-30 DIAGNOSIS — Z452 Encounter for adjustment and management of vascular access device: Secondary | ICD-10-CM | POA: Diagnosis not present

## 2023-07-30 DIAGNOSIS — I38 Endocarditis, valve unspecified: Secondary | ICD-10-CM | POA: Diagnosis not present

## 2023-07-30 DIAGNOSIS — I059 Rheumatic mitral valve disease, unspecified: Secondary | ICD-10-CM | POA: Diagnosis not present

## 2023-07-30 DIAGNOSIS — R7881 Bacteremia: Secondary | ICD-10-CM | POA: Diagnosis not present

## 2023-07-30 DIAGNOSIS — Z5181 Encounter for therapeutic drug level monitoring: Secondary | ICD-10-CM | POA: Diagnosis not present

## 2023-07-30 NOTE — Telephone Encounter (Signed)
  Per provider ok to PULL PICC after End Date.   Provider:Robert Comer MD End Date:07/30/2023   Notified [facility] and [facility] to contact Home Health Nurse.

## 2023-07-30 NOTE — Telephone Encounter (Signed)
 I spoke to pt's son who will reach out to patient to call us and reschedule INR appt

## 2023-07-31 ENCOUNTER — Encounter: Payer: Self-pay | Admitting: Internal Medicine

## 2023-07-31 DIAGNOSIS — Z452 Encounter for adjustment and management of vascular access device: Secondary | ICD-10-CM | POA: Insufficient documentation

## 2023-07-31 DIAGNOSIS — Z5181 Encounter for therapeutic drug level monitoring: Secondary | ICD-10-CM | POA: Insufficient documentation

## 2023-07-31 NOTE — Assessment & Plan Note (Signed)
 His labs have been reassuring and no concerns.  No follow-up labs will be indicated.

## 2023-07-31 NOTE — Assessment & Plan Note (Addendum)
 He seems to be recovering well with good activity and no issues from his mitral valve that he has noted.  There are no concerns with removal of mitral valve and he has been on prolonged postoperative antibiotics.  Has completed 4 weeks of antibiotics postsurgery today and this will be stopped.SABRA  He will continue with follow-up with cardiology and cardiothoracic surgery.  He can follow-up here as needed.

## 2023-07-31 NOTE — Assessment & Plan Note (Signed)
 Okay to remove his PICC line and this will be done by home health

## 2023-07-31 NOTE — Progress Notes (Signed)
   Subjective:    Patient ID: Victor [name], male    DOB: 11/08/1970, 53 y.o.   MRN: [id number]  HPI Victor [name] is here for hospital follow-up before bacteremia. He was hospitalized in the end of November and found to have Streptococcus salivarius bacteremia.  He was treated with IV penicillin  and then found to have mitral valve endocarditis and this required mitral valve replacement which was done on 07/02/23 by Dr. Maryjane.   He was then continued on IV penicillin  for planned 4 weeks through today.  Culture from the mitral valve remain negative.  He feels well with increasing activity and has no complaints his incision has closed.  No associated rash or diarrhea.   Review of Systems  Constitutional:  Negative for chills, fatigue and fever.  Gastrointestinal:  Negative for diarrhea and nausea.  Skin:  Negative for rash.       Objective:   Physical Exam Eyes:     General: No scleral icterus. Pulmonary:     Effort: Pulmonary effort is normal.  Neurological:     Mental Status: He is alert.   SH: no tobacco        Assessment & Plan:

## 2023-08-01 DIAGNOSIS — I38 Endocarditis, valve unspecified: Secondary | ICD-10-CM | POA: Diagnosis not present

## 2023-08-01 DIAGNOSIS — R7881 Bacteremia: Secondary | ICD-10-CM | POA: Diagnosis not present

## 2023-08-06 ENCOUNTER — Ambulatory Visit: Payer: Medicaid Other | Attending: Thoracic Surgery (Cardiothoracic Vascular Surgery)

## 2023-08-15 ENCOUNTER — Ambulatory Visit (HOSPITAL_COMMUNITY): Payer: Medicaid Other | Attending: Cardiology

## 2023-08-15 ENCOUNTER — Encounter (HOSPITAL_COMMUNITY): Payer: Self-pay | Admitting: Cardiovascular Disease

## 2023-08-21 NOTE — Progress Notes (Deleted)
 [address]       [place] [address]             [phone]           Janelle Culton [facility] Medical Record #[id number] Date of Birth: 10/18/1970  Tonny Bollman, MD Claiborne Rigg, NP  Chief Complaint:     History of Present Illness:           Past Medical History:  Diagnosis Date   CAD (coronary artery disease)    Nonobstructive on [facility] 06/2023   Dizziness 10/15/2013    "SWIMMY HEAD    "   HOCM (hypertrophic obstructive cardiomyopathy) (HCC)    Hypertension    Hypertriglyceridemia    Obesity    Severe mitral regurgitation     Past Surgical History:  Procedure Laterality Date   BIOPSY  06/28/2023   Procedure: BIOPSY;  Surgeon: Benancio Deeds, MD;  Location: [facility] ENDOSCOPY;  Service: Gastroenterology;;   COLONOSCOPY WITH PROPOFOL N/A 06/28/2023   Procedure: COLONOSCOPY WITH PROPOFOL;  Surgeon: Benancio Deeds, MD;  Location: [facility] ENDOSCOPY;  Service: Gastroenterology;  Laterality: N/A;   ESOPHAGOGASTRODUODENOSCOPY (EGD) WITH PROPOFOL N/A 06/28/2023   Procedure: ESOPHAGOGASTRODUODENOSCOPY (EGD) WITH PROPOFOL;  Surgeon: Benancio Deeds, MD;  Location: [facility] ENDOSCOPY;  Service: Gastroenterology;  Laterality: N/A;   EXPLORATION POST OPERATIVE OPEN HEART N/A 07/02/2023   Procedure: EXPLORATION POST OPERATIVE OPEN HEART;  Surgeon: Eugenio Hoes, MD;  Location: [facility] OR;  Service: Open Heart Surgery;  Laterality: N/A;   INGUINAL HERNIA REPAIR Right 02/03/2021   Procedure: HERNIA REPAIR INGUINAL ADULT;  Surgeon: Diamantina Monks, MD;  Location: [facility] OR;  Service: General;  Laterality: Right;   INSERTION OF MESH Right 02/03/2021   Procedure: INSERTION OF MESH;  Surgeon: Diamantina Monks, MD;  Location: [facility] OR;  Service: General;  Laterality: Right;   IR THORACENTESIS ASP PLEURAL SPACE W/IMG GUIDE  07/26/2023   MITRAL VALVE REPLACEMENT N/A 07/02/2023   Procedure: MITRAL VALVE (MV) REPLACEMENT USING 29 MM SJM MASTES SERIES MECHANICAL HEART VALVE,  SUBAORTIC MYECTOMY;  Surgeon: Eugenio Hoes, MD;  Location: [facility] OR;  Service: Open Heart Surgery;  Laterality: N/A;   NO PAST SURGERIES     POLYPECTOMY  06/28/2023   Procedure: POLYPECTOMY;  Surgeon: Benancio Deeds, MD;  Location: [facility] ENDOSCOPY;  Service: Gastroenterology;;   RIGHT/LEFT HEART CATH AND CORONARY ANGIOGRAPHY N/A 06/24/2023   Procedure: RIGHT/LEFT HEART CATH AND CORONARY ANGIOGRAPHY;  Surgeon: Yvonne Kendall, MD;  Location: [facility] INVASIVE CV LAB;  Service: Cardiovascular;  Laterality: N/A;   SUBMUCOSAL LIFTING INJECTION  06/28/2023   Procedure: SUBMUCOSAL LIFTING INJECTION;  Surgeon: Benancio Deeds, MD;  Location: [facility] ENDOSCOPY;  Service: Gastroenterology;;   SUBMUCOSAL TATTOO INJECTION  06/28/2023   Procedure: SUBMUCOSAL TATTOO INJECTION;  Surgeon: Benancio Deeds, MD;  Location: [facility] ENDOSCOPY;  Service: Gastroenterology;;   TEE WITHOUT CARDIOVERSION N/A 07/02/2023   Procedure: TRANSESOPHAGEAL ECHOCARDIOGRAM (TEE);  Surgeon: Eugenio Hoes, MD;  Location: [facility] OR;  Service: Open Heart Surgery;  Laterality: N/A;   TRANSESOPHAGEAL ECHOCARDIOGRAM (CATH LAB) N/A 06/21/2023   Procedure: TRANSESOPHAGEAL ECHOCARDIOGRAM;  Surgeon: Sande Rives, MD;  Location: [facility] INVASIVE CV LAB;  Service: Cardiovascular;  Laterality: N/A;    Social History   Tobacco Use  Smoking Status Never  Smokeless Tobacco Current   Types: Chew    Social History   Substance and Sexual Activity  Alcohol Use Yes   Comment: 4 12 oz beers twice/wk.  Social History   Socioeconomic History   Marital status: Single    Spouse name: Not on file   Number of children: Not on file   Years of education: Not on file   Highest education level: Not on file  Occupational History   Not on file  Tobacco Use   Smoking status: Never   Smokeless tobacco: Current    Types: Chew  Vaping Use   Vaping status: Never Used  Substance and Sexual Activity   Alcohol use: Yes    Comment: 4 12 oz beers  twice/wk.   Drug use: No   Sexual activity: Yes  Other Topics Concern   Not on file  Social History Narrative   Lives in [place].  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   Social Drivers of Corporate investment banker Strain: Not on file  Food Insecurity: Food Insecurity Present (06/18/2023)   Hunger Vital Sign    Worried About Running Out of Food in the Last Year: Sometimes true    Ran Out of Food in the Last Year: Sometimes true  Transportation Needs: Unmet Transportation Needs (06/18/2023)   PRAPARE - Administrator, Civil Service (Medical): Yes    Lack of Transportation (Non-Medical): No  Physical Activity: Not on file  Stress: Not on file  Social Connections: Not on file  Intimate Partner Violence: Not At Risk (06/18/2023)   Humiliation, Afraid, Rape, and Kick questionnaire    Fear of Current or Ex-Partner: No    Emotionally Abused: No    Physically Abused: No    Sexually Abused: No    Allergies  Allergen Reactions   Iodinated Contrast Media Itching   Gadolinium Derivatives     Current Outpatient Medications  Medication Sig Dispense Refill   acetaminophen (TYLENOL) 500 MG tablet Take 1,000 mg by mouth in the morning, at noon, and at bedtime.     diphenhydrAMINE (BENADRYL) 50 MG tablet Patient is to take one tablet by mouth 1 hour before procedure on 1/3. (Patient not taking: Reported on 07/30/2023) 1 tablet 0   ferrous gluconate (FERGON) 324 MG tablet Take 1 tablet (324 mg total) by mouth daily with breakfast. 60 tablet 1   gabapentin (NEURONTIN) 300 MG capsule Take 1 capsule (300 mg total) by mouth at bedtime. 30 capsule 1   gemfibrozil (LOPID) 600 MG tablet Take 1 tablet (600 mg total) by mouth 2 (two) times daily before a meal. 60 tablet 0   losartan (COZAAR) 25 MG tablet Take 1 tablet (25 mg total) by mouth daily. (Patient not taking: Reported on 07/30/2023) 30 tablet 1   metoprolol tartrate (LOPRESSOR) 25 MG tablet Take 1 tablet (25 mg total)  by mouth 2 (two) times daily. 60 tablet 1   predniSONE (DELTASONE) 50 MG tablet Patient is to take one tablet by mouth 13 hours, 7 hours, and 1 hour before procedure on 1/3. (Patient not taking: Reported on 07/30/2023) 3 tablet 0   senna-docusate (SENOKOT-S) 8.6-50 MG tablet Take 1 tablet by mouth at bedtime as needed for mild constipation. 30 tablet 0   traZODone (DESYREL) 50 MG tablet Take 1 tablet (50 mg total) by mouth at bedtime. 30 tablet 0   warfarin (COUMADIN) 5 MG tablet Take 5mg  on Tuesday Thursday Saturday and Sunday and 7.5 mg on Monday Wednesday Friday 45 tablet 1   No current facility-administered medications for this visit.     Family History  Problem Relation Age of  Onset   CAD Mother        stenting @ 70; alive @ 36   Aneurysm Mother        thoracic s/p surgery @ 75   Lung disease Mother    Other Father        unknown       Physical Exam:      Diagnostic Studies & Laboratory data: I have personally reviewed the following studies and agree with the findings     Recent Radiology Findings:       Recent Lab Findings: Lab Results  Component Value Date   WBC 8.7 07/14/2023   HGB 9.1 (L) 07/14/2023   HCT 28.3 (L) 07/14/2023   PLT 238 07/14/2023   GLUCOSE 112 (H) 07/14/2023   CHOL 146 07/27/2020   TRIG 96 07/03/2023   HDL 48 07/27/2020   LDLCALC 73 07/27/2020   ALT 17 07/05/2023   AST 29 07/05/2023   NA 129 (L) 07/14/2023   K 4.2 07/14/2023   CL 97 (L) 07/14/2023   CREATININE 0.66 07/14/2023   BUN 10 07/14/2023   CO2 22 07/14/2023   TSH 1.613 06/18/2023   INR 2.7 07/23/2023   HGBA1C 5.9 (H) 06/18/2023      Assessment / Plan:        I have spent *** min in review of the records, viewing studies and in face to face with patient and in coordination of future care    Eugenio Hoes 08/21/2023 1:15 PM

## 2023-08-22 ENCOUNTER — Ambulatory Visit: Payer: Medicaid Other | Admitting: Thoracic Surgery (Cardiothoracic Vascular Surgery)

## 2023-08-23 ENCOUNTER — Encounter: Payer: Self-pay | Admitting: Thoracic Surgery (Cardiothoracic Vascular Surgery)

## 2023-08-24 DIAGNOSIS — Z419 Encounter for procedure for purposes other than remedying health state, unspecified: Secondary | ICD-10-CM | POA: Diagnosis not present

## 2023-09-03 ENCOUNTER — Other Ambulatory Visit: Payer: Self-pay

## 2023-09-13 ENCOUNTER — Other Ambulatory Visit: Payer: Self-pay

## 2023-09-20 ENCOUNTER — Ambulatory Visit: Payer: Medicaid Other | Admitting: Nurse Practitioner

## 2023-09-21 DIAGNOSIS — Z419 Encounter for procedure for purposes other than remedying health state, unspecified: Secondary | ICD-10-CM | POA: Diagnosis not present

## 2023-10-02 DIAGNOSIS — Z419 Encounter for procedure for purposes other than remedying health state, unspecified: Secondary | ICD-10-CM | POA: Diagnosis not present

## 2023-10-09 NOTE — Progress Notes (Deleted)
 Cardiology Office Note:  .   Date:  10/09/2023  ID:  Victor [name], DOB 1970/12/08, MRN [id number] PCP: Claiborne Rigg, NP  [facility] Providers Cardiologist:  Tonny Bollman, MD { Click to update primary MD,subspecialty MD or APP then REFRESH:1}   History of Present Illness: .   No chief complaint on file.   Victor [name] is a 53 y.o. male with history of hocm, htn, hld, cad who presents for follow-up.       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 Pertinent positives noted in the HPI.     Studies Reviewed: Marland Kitchen       CMR 06/24/2023 IMPRESSION: 1.  Small pericardial effusion.   2. Vigorous LV systolic function, EF 69%. There was moderate asymmetric basal to mid septal hypertrophy. Suspect LV outflow gradient given narrowed LVOT and turbulence in LVOT. Difficult study with poor images due to respiratory artifact, unable to comment on subaortic membrane presence.   3.  Normal RV size and systolic function, EF 56%.   4. The mitral valve leaflets appear thickened. Images not sufficient to comment on presence or absence of vegetation. There is either chordal SAM or MV SAM (images not sufficient to distinguish). There is moderate-severe mitral regurgitation with mitral regurgitant fraction 39% and regurgitant volume 54 cc.   5. Diffuse non-coronary LGE pattern. This could be consistent with hypertrophic cardiomyopathy (or another infiltrative disease).   6. Elevated extracellular volume percentage at 39%. This suggests increased myocardial fibrotic content. LHC 06/24/2023 Nonobstructive coronary artery disease, as detailed below, including sequential 20% and 20-30% proximal/mid LAD stenoses, 50-60% ostial D1 lesion, and mild plaquing of OM1 and mid RCA. Severely elevated  left heart and pulmonary artery pressures (PCWP 34 mmHg, LVEDP 40 mmHg, mean PA 46 mmHg). Mildly elevated right heart filling pressures (mean RA 7 mmHg, RVEDP 9 mmHg). Normal Fick cardiac output/index (CO 6.4 L/min, CI 3.3 L/min/m^2). 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 Suspected acute allergic reaction to IV contrast treated with IV diphenhydramine and methylprednisolone. TEE 06/21/2023  1. Aortic valve is trileaflet without evidence of stenosis. 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 MG 19.4 mmHG. Vmax 3.1 m/s.  Peak gradient 38.1 mmHG. There is SAM of the subchordal apparatus which  has calcifications. Suspect this  explains the dynamic obstruction which is related to [facility] of the chordal  calcification and a subaortic membrane. The aortic valve is tricuspid.  Aortic valve regurgitation is not visualized. No aortic stenosis is  present.   2. Degnerative mitral valve with moderately calcified anterior and  posterior mitral valve leaflets. There is a vegetation on the PMVL that  measures 0.8 cm x 0.6 cm. There is severe mitral regurgitation due to  incomplete leaflet coaptation, but cannot  exclude a posterior mitral valve cleft. There is also chordal  calcifications with SAM and dynamic outflow obstruction associated with a  subaortic membrane as described. MVA 2.89 cm2. MG 4 mmHG @ 86 bpm. The  mitral valve is degenerative. Severe mitral  valve regurgitation. Mild mitral stenosis. The mean mitral valve gradient  is 4.0 mmHg with average heart rate of 86 bpm.   3. Left ventricular ejection fraction, by estimation, is 65 to 70%. The  left ventricle has normal function. There is moderate asymmetric left  ventricular hypertrophy of the basal-septal segment.   4. Right ventricular systolic function is normal. The right  ventricular  size is normal.   5. No left atrial/left atrial appendage thrombus was detected. The LAA  emptying velocity was 83 cm/s.   6. Agitated saline contrast bubble study was negative, with no evidence  of any interatrial shunt.  Physical Exam:   VS:  There were no vitals taken for this visit.   Wt Readings from Last 3 Encounters:  07/30/23 192 lb (87.1 kg)  07/23/23 194 lb 12.8 oz (88.4 kg)  07/18/23 191 lb (86.6 kg)    GEN: Well nourished, well developed in no acute distress NECK: No JVD; No carotid bruits CARDIAC: ***RRR, no murmurs, rubs, gallops RESPIRATORY:  Clear to auscultation without rales, wheezing or rhonchi  ABDOMEN: Soft, non-tender, non-distended EXTREMITIES:  No edema; No deformity  ASSESSMENT AND PLAN: .   *** {The patient has an active order for outpatient cardiac rehabilitation.   Please indicate if the patient is ready to start. Do NOT delete this.  It will auto delete.  Refresh note, then sign.              Click here to document readiness and see contraindications.  :1}  Cardiac Rehabilitation Eligibility Assessment      {Are you ordering a CV Procedure (e.g. stress test, cath, DCCV, TEE, etc)?   Press F2        :[id number]}   Follow-up: No follow-ups on file.  Time Spent with Patient: I have spent a total of *** minutes caring for this patient today face to face, ordering and reviewing labs/tests, reviewing prior records/medical history, examining the patient, establishing an assessment and plan, communicating results/findings to the patient/family, and documenting in the medical record.   Signed, Lenna Gilford. Flora Lipps, MD, [facility]  [facility]  [address] [place], [place] [address] [phone]  8:17 AM

## 2023-10-10 ENCOUNTER — Ambulatory Visit: Payer: Medicaid Other | Attending: Cardiovascular Disease | Admitting: Cardiovascular Disease

## 2023-10-10 DIAGNOSIS — E782 Mixed hyperlipidemia: Secondary | ICD-10-CM

## 2023-10-10 DIAGNOSIS — Z952 Presence of prosthetic heart valve: Secondary | ICD-10-CM

## 2023-10-10 DIAGNOSIS — I2510 Atherosclerotic heart disease of native coronary artery without angina pectoris: Secondary | ICD-10-CM

## 2023-10-10 DIAGNOSIS — I421 Obstructive hypertrophic cardiomyopathy: Secondary | ICD-10-CM

## 2023-10-10 DIAGNOSIS — I059 Rheumatic mitral valve disease, unspecified: Secondary | ICD-10-CM

## 2023-10-10 DIAGNOSIS — I150 Renovascular hypertension: Secondary | ICD-10-CM

## 2023-10-10 DIAGNOSIS — Q244 Congenital subaortic stenosis: Secondary | ICD-10-CM

## 2023-10-11 ENCOUNTER — Encounter: Payer: Self-pay | Admitting: Cardiovascular Disease

## 2023-11-02 DIAGNOSIS — Z419 Encounter for procedure for purposes other than remedying health state, unspecified: Secondary | ICD-10-CM | POA: Diagnosis not present

## 2023-12-02 DIAGNOSIS — Z419 Encounter for procedure for purposes other than remedying health state, unspecified: Secondary | ICD-10-CM | POA: Diagnosis not present

## 2024-01-02 DIAGNOSIS — Z419 Encounter for procedure for purposes other than remedying health state, unspecified: Secondary | ICD-10-CM | POA: Diagnosis not present

## 2024-01-20 ENCOUNTER — Ambulatory Visit: Admitting: Internal Medicine

## 2024-06-03 DIAGNOSIS — Z419 Encounter for procedure for purposes other than remedying health state, unspecified: Secondary | ICD-10-CM | POA: Diagnosis not present

## 2024-06-30 ENCOUNTER — Encounter (HOSPITAL_COMMUNITY): Payer: Self-pay | Admitting: Surgery

## 2024-08-28 ENCOUNTER — Encounter (HOSPITAL_BASED_OUTPATIENT_CLINIC_OR_DEPARTMENT_OTHER): Admitting: Family Medicine
